# Patient Record
Sex: Female | Born: 1940
Health system: Southern US, Community
[De-identification: ages and names within clinical notes are randomized; demographics above are authoritative.]

## PROBLEM LIST (undated history)

## (undated) DIAGNOSIS — K5730 Diverticulosis of large intestine without perforation or abscess without bleeding: Secondary | ICD-10-CM

## (undated) DIAGNOSIS — G43909 Migraine, unspecified, not intractable, without status migrainosus: Secondary | ICD-10-CM

## (undated) DIAGNOSIS — K449 Diaphragmatic hernia without obstruction or gangrene: Secondary | ICD-10-CM

## (undated) DIAGNOSIS — I82409 Acute embolism and thrombosis of unspecified deep veins of unspecified lower extremity: Secondary | ICD-10-CM

## (undated) DIAGNOSIS — K222 Esophageal obstruction: Secondary | ICD-10-CM

## (undated) DIAGNOSIS — M069 Rheumatoid arthritis, unspecified: Secondary | ICD-10-CM

## (undated) DIAGNOSIS — E669 Obesity, unspecified: Secondary | ICD-10-CM

## (undated) DIAGNOSIS — M1990 Unspecified osteoarthritis, unspecified site: Secondary | ICD-10-CM

## (undated) DIAGNOSIS — Z800 Family history of malignant neoplasm of digestive organs: Secondary | ICD-10-CM

## (undated) DIAGNOSIS — E785 Hyperlipidemia, unspecified: Secondary | ICD-10-CM

## (undated) DIAGNOSIS — R002 Palpitations: Secondary | ICD-10-CM

## (undated) DIAGNOSIS — K2970 Gastritis, unspecified, without bleeding: Secondary | ICD-10-CM

## (undated) DIAGNOSIS — T7840XA Allergy, unspecified, initial encounter: Secondary | ICD-10-CM

## (undated) DIAGNOSIS — G8929 Other chronic pain: Secondary | ICD-10-CM

## (undated) DIAGNOSIS — K219 Gastro-esophageal reflux disease without esophagitis: Secondary | ICD-10-CM

## (undated) DIAGNOSIS — J189 Pneumonia, unspecified organism: Secondary | ICD-10-CM

## (undated) DIAGNOSIS — M549 Dorsalgia, unspecified: Secondary | ICD-10-CM

## (undated) DIAGNOSIS — Z86010 Personal history of colonic polyps: Secondary | ICD-10-CM

## (undated) DIAGNOSIS — G43119 Migraine with aura, intractable, without status migrainosus: Secondary | ICD-10-CM

## (undated) DIAGNOSIS — M797 Fibromyalgia: Secondary | ICD-10-CM

## (undated) DIAGNOSIS — I499 Cardiac arrhythmia, unspecified: Secondary | ICD-10-CM

## (undated) DIAGNOSIS — M419 Scoliosis, unspecified: Secondary | ICD-10-CM

## (undated) DIAGNOSIS — I10 Essential (primary) hypertension: Secondary | ICD-10-CM

## (undated) DIAGNOSIS — R079 Chest pain, unspecified: Secondary | ICD-10-CM

## (undated) DIAGNOSIS — I639 Cerebral infarction, unspecified: Secondary | ICD-10-CM

## (undated) DIAGNOSIS — K2990 Gastroduodenitis, unspecified, without bleeding: Secondary | ICD-10-CM

## (undated) HISTORY — DX: Gastritis, unspecified, without bleeding: K29.70

## (undated) HISTORY — DX: Diaphragmatic hernia without obstruction or gangrene: K44.9

## (undated) HISTORY — DX: Palpitations: R00.2

## (undated) HISTORY — DX: Esophageal obstruction: K22.2

## (undated) HISTORY — DX: Gastro-esophageal reflux disease without esophagitis: K21.9

## (undated) HISTORY — DX: Scoliosis, unspecified: M41.9

## (undated) HISTORY — DX: Rheumatoid arthritis, unspecified: M06.9

## (undated) HISTORY — DX: Diverticulosis of large intestine without perforation or abscess without bleeding: K57.30

## (undated) HISTORY — DX: Family history of malignant neoplasm of digestive organs: Z80.0

## (undated) HISTORY — PX: BREAST SURGERY: SHX581

## (undated) HISTORY — PX: BACK SURGERY: SHX140

## (undated) HISTORY — DX: Essential (primary) hypertension: I10

## (undated) HISTORY — DX: Cerebral infarction, unspecified: I63.9

## (undated) HISTORY — DX: Personal history of colonic polyps: Z86.010

## (undated) HISTORY — PX: UPPER GASTROINTESTINAL ENDOSCOPY: SHX188

## (undated) HISTORY — PX: TUMOR REMOVAL: SHX12

## (undated) HISTORY — DX: Obesity, unspecified: E66.9

## (undated) HISTORY — DX: Gastroduodenitis, unspecified, without bleeding: K29.90

## (undated) HISTORY — DX: Allergy, unspecified, initial encounter: T78.40XA

## (undated) HISTORY — DX: Migraine with aura, intractable, without status migrainosus: G43.119

## (undated) HISTORY — PX: ESOPHAGEAL DILATION: SHX303

## (undated) HISTORY — DX: Hyperlipidemia, unspecified: E78.5

## (undated) HISTORY — PX: COLON SURGERY: SHX602

## (undated) HISTORY — PX: SALIVARY GLAND SURGERY: SHX768

---

## 1998-01-02 ENCOUNTER — Encounter: Payer: Self-pay | Admitting: Obstetrics and Gynecology

## 1998-01-02 ENCOUNTER — Ambulatory Visit (HOSPITAL_COMMUNITY): Admission: RE | Admit: 1998-01-02 | Discharge: 1998-01-02 | Payer: Self-pay | Admitting: Obstetrics and Gynecology

## 1998-10-28 ENCOUNTER — Emergency Department (HOSPITAL_COMMUNITY): Admission: EM | Admit: 1998-10-28 | Discharge: 1998-10-28 | Payer: Self-pay | Admitting: Emergency Medicine

## 1998-10-28 ENCOUNTER — Encounter: Payer: Self-pay | Admitting: Emergency Medicine

## 1999-01-15 ENCOUNTER — Ambulatory Visit (HOSPITAL_COMMUNITY): Admission: RE | Admit: 1999-01-15 | Discharge: 1999-01-15 | Payer: Self-pay | Admitting: Internal Medicine

## 1999-01-15 ENCOUNTER — Encounter: Payer: Self-pay | Admitting: Obstetrics and Gynecology

## 1999-07-26 ENCOUNTER — Encounter: Payer: Self-pay | Admitting: Gastroenterology

## 1999-07-26 ENCOUNTER — Ambulatory Visit: Admission: RE | Admit: 1999-07-26 | Discharge: 1999-07-26 | Payer: Self-pay | Admitting: Gastroenterology

## 1999-09-02 ENCOUNTER — Other Ambulatory Visit: Admission: RE | Admit: 1999-09-02 | Discharge: 1999-09-02 | Payer: Self-pay | Admitting: Gastroenterology

## 1999-09-08 HISTORY — PX: SIGMOID RESECTION / RECTOPEXY: SUR1294

## 1999-09-26 ENCOUNTER — Encounter: Payer: Self-pay | Admitting: General Surgery

## 1999-10-01 ENCOUNTER — Encounter (INDEPENDENT_AMBULATORY_CARE_PROVIDER_SITE_OTHER): Payer: Self-pay | Admitting: Specialist

## 1999-10-01 ENCOUNTER — Inpatient Hospital Stay (HOSPITAL_COMMUNITY): Admission: RE | Admit: 1999-10-01 | Discharge: 1999-10-08 | Payer: Self-pay | Admitting: General Surgery

## 2000-02-11 ENCOUNTER — Ambulatory Visit (HOSPITAL_COMMUNITY): Admission: RE | Admit: 2000-02-11 | Discharge: 2000-02-11 | Payer: Self-pay | Admitting: *Deleted

## 2000-02-11 ENCOUNTER — Encounter: Payer: Self-pay | Admitting: *Deleted

## 2000-04-10 ENCOUNTER — Encounter: Payer: Self-pay | Admitting: General Surgery

## 2000-04-10 HISTORY — PX: EXPLORATORY LAPAROTOMY: SUR591

## 2000-04-16 ENCOUNTER — Encounter (INDEPENDENT_AMBULATORY_CARE_PROVIDER_SITE_OTHER): Payer: Self-pay | Admitting: Specialist

## 2000-04-16 ENCOUNTER — Inpatient Hospital Stay (HOSPITAL_COMMUNITY): Admission: RE | Admit: 2000-04-16 | Discharge: 2000-04-19 | Payer: Self-pay | Admitting: General Surgery

## 2000-07-17 ENCOUNTER — Emergency Department (HOSPITAL_COMMUNITY): Admission: EM | Admit: 2000-07-17 | Discharge: 2000-07-17 | Payer: Self-pay | Admitting: Emergency Medicine

## 2000-07-17 ENCOUNTER — Encounter: Payer: Self-pay | Admitting: Emergency Medicine

## 2000-12-28 ENCOUNTER — Other Ambulatory Visit: Admission: RE | Admit: 2000-12-28 | Discharge: 2000-12-28 | Payer: Self-pay | Admitting: *Deleted

## 2001-09-02 ENCOUNTER — Ambulatory Visit (HOSPITAL_COMMUNITY): Admission: RE | Admit: 2001-09-02 | Discharge: 2001-09-02 | Payer: Self-pay | Admitting: *Deleted

## 2001-09-02 ENCOUNTER — Encounter: Payer: Self-pay | Admitting: *Deleted

## 2002-02-18 DIAGNOSIS — K222 Esophageal obstruction: Secondary | ICD-10-CM | POA: Insufficient documentation

## 2002-02-21 ENCOUNTER — Other Ambulatory Visit: Admission: RE | Admit: 2002-02-21 | Discharge: 2002-02-21 | Payer: Self-pay | Admitting: *Deleted

## 2002-02-22 ENCOUNTER — Encounter: Payer: Self-pay | Admitting: Emergency Medicine

## 2002-02-22 ENCOUNTER — Emergency Department (HOSPITAL_COMMUNITY): Admission: EM | Admit: 2002-02-22 | Discharge: 2002-02-22 | Payer: Self-pay | Admitting: Emergency Medicine

## 2002-03-10 HISTORY — PX: TRIGGER FINGER RELEASE: SHX641

## 2002-12-13 ENCOUNTER — Encounter: Admission: RE | Admit: 2002-12-13 | Discharge: 2003-03-13 | Payer: Self-pay | Admitting: Family Medicine

## 2002-12-19 ENCOUNTER — Encounter: Admission: RE | Admit: 2002-12-19 | Discharge: 2002-12-19 | Payer: Self-pay | Admitting: Family Medicine

## 2002-12-19 ENCOUNTER — Encounter: Payer: Self-pay | Admitting: Family Medicine

## 2003-01-04 ENCOUNTER — Ambulatory Visit (HOSPITAL_COMMUNITY): Admission: RE | Admit: 2003-01-04 | Discharge: 2003-01-04 | Payer: Self-pay | Admitting: *Deleted

## 2003-01-11 ENCOUNTER — Other Ambulatory Visit: Admission: RE | Admit: 2003-01-11 | Discharge: 2003-01-11 | Payer: Self-pay | Admitting: *Deleted

## 2003-01-23 ENCOUNTER — Encounter: Admission: RE | Admit: 2003-01-23 | Discharge: 2003-01-23 | Payer: Self-pay | Admitting: Orthopedic Surgery

## 2003-02-10 ENCOUNTER — Encounter: Admission: RE | Admit: 2003-02-10 | Discharge: 2003-02-10 | Payer: Self-pay | Admitting: Orthopedic Surgery

## 2003-05-16 ENCOUNTER — Encounter: Admission: RE | Admit: 2003-05-16 | Discharge: 2003-05-16 | Payer: Self-pay | Admitting: Orthopedic Surgery

## 2003-05-17 ENCOUNTER — Encounter: Admission: RE | Admit: 2003-05-17 | Discharge: 2003-08-15 | Payer: Self-pay | Admitting: Family Medicine

## 2003-11-22 ENCOUNTER — Encounter: Admission: RE | Admit: 2003-11-22 | Discharge: 2003-11-22 | Payer: Self-pay | Admitting: Gastroenterology

## 2004-01-31 ENCOUNTER — Ambulatory Visit (HOSPITAL_COMMUNITY): Admission: RE | Admit: 2004-01-31 | Discharge: 2004-01-31 | Payer: Self-pay | Admitting: *Deleted

## 2004-02-06 ENCOUNTER — Other Ambulatory Visit: Admission: RE | Admit: 2004-02-06 | Discharge: 2004-02-06 | Payer: Self-pay | Admitting: *Deleted

## 2005-01-22 ENCOUNTER — Emergency Department (HOSPITAL_COMMUNITY): Admission: EM | Admit: 2005-01-22 | Discharge: 2005-01-22 | Payer: Self-pay | Admitting: Emergency Medicine

## 2005-01-28 ENCOUNTER — Ambulatory Visit: Payer: Self-pay | Admitting: Gastroenterology

## 2005-09-29 ENCOUNTER — Emergency Department (HOSPITAL_COMMUNITY): Admission: EM | Admit: 2005-09-29 | Discharge: 2005-09-30 | Payer: Self-pay | Admitting: Emergency Medicine

## 2005-12-08 ENCOUNTER — Ambulatory Visit (HOSPITAL_COMMUNITY): Admission: RE | Admit: 2005-12-08 | Discharge: 2005-12-08 | Payer: Self-pay | Admitting: *Deleted

## 2006-02-15 ENCOUNTER — Encounter: Admission: RE | Admit: 2006-02-15 | Discharge: 2006-02-15 | Payer: Self-pay | Admitting: Internal Medicine

## 2006-03-10 HISTORY — PX: SHOULDER SURGERY: SHX246

## 2006-03-23 ENCOUNTER — Encounter: Admission: RE | Admit: 2006-03-23 | Discharge: 2006-03-23 | Payer: Self-pay | Admitting: Family Medicine

## 2006-03-23 ENCOUNTER — Other Ambulatory Visit: Admission: RE | Admit: 2006-03-23 | Discharge: 2006-03-23 | Payer: Self-pay | Admitting: *Deleted

## 2006-04-20 ENCOUNTER — Ambulatory Visit (HOSPITAL_COMMUNITY): Admission: RE | Admit: 2006-04-20 | Discharge: 2006-04-21 | Payer: Self-pay | Admitting: Specialist

## 2007-03-11 HISTORY — PX: COLONOSCOPY: SHX174

## 2007-05-06 ENCOUNTER — Ambulatory Visit: Payer: Self-pay | Admitting: Gastroenterology

## 2007-05-06 LAB — CONVERTED CEMR LAB
AST: 20 units/L (ref 0–37)
Albumin: 3.8 g/dL (ref 3.5–5.2)
Alkaline Phosphatase: 60 units/L (ref 39–117)
BUN: 8 mg/dL (ref 6–23)
Basophils Absolute: 0.1 10*3/uL (ref 0.0–0.1)
Basophils Relative: 1.1 % — ABNORMAL HIGH (ref 0.0–1.0)
CO2: 32 meq/L (ref 19–32)
Chloride: 106 meq/L (ref 96–112)
Creatinine, Ser: 0.9 mg/dL (ref 0.4–1.2)
Eosinophils Absolute: 0.1 10*3/uL (ref 0.0–0.6)
Eosinophils Relative: 1.5 % (ref 0.0–5.0)
Folate: 13.9 ng/mL
GFR calc Af Amer: 81 mL/min
GFR calc non Af Amer: 67 mL/min
HCT: 45.1 % (ref 36.0–46.0)
Hemoglobin: 14.9 g/dL (ref 12.0–15.0)
Iron: 68 ug/dL (ref 42–145)
Lymphocytes Relative: 35.5 % (ref 12.0–46.0)
MCHC: 33 g/dL (ref 30.0–36.0)
MCV: 82.9 fL (ref 78.0–100.0)
Monocytes Absolute: 0.5 10*3/uL (ref 0.2–0.7)
Monocytes Relative: 7.9 % (ref 3.0–11.0)
Neutro Abs: 3.6 10*3/uL (ref 1.4–7.7)
Neutrophils Relative %: 54 % (ref 43.0–77.0)
Potassium: 4.4 meq/L (ref 3.5–5.1)
RBC: 5.44 M/uL — ABNORMAL HIGH (ref 3.87–5.11)
RDW: 12.9 % (ref 11.5–14.6)
Saturation Ratios: 18.1 % — ABNORMAL LOW (ref 20.0–50.0)
Sodium: 143 meq/L (ref 135–145)
Total Bilirubin: 1.2 mg/dL (ref 0.3–1.2)
Total Protein: 6.8 g/dL (ref 6.0–8.3)
Transferrin: 269 mg/dL (ref >=212.0)
Vitamin B-12: 247 pg/mL (ref 211–911)
WBC: 6.7 10*3/uL (ref 4.5–10.5)

## 2007-05-10 ENCOUNTER — Ambulatory Visit (HOSPITAL_COMMUNITY): Admission: RE | Admit: 2007-05-10 | Discharge: 2007-05-10 | Payer: Self-pay | Admitting: *Deleted

## 2007-05-12 ENCOUNTER — Ambulatory Visit (HOSPITAL_COMMUNITY): Admission: RE | Admit: 2007-05-12 | Discharge: 2007-05-12 | Payer: Self-pay | Admitting: Gastroenterology

## 2007-05-18 ENCOUNTER — Ambulatory Visit: Payer: Self-pay | Admitting: Gastroenterology

## 2007-05-27 ENCOUNTER — Other Ambulatory Visit: Admission: RE | Admit: 2007-05-27 | Discharge: 2007-05-27 | Payer: Self-pay | Admitting: *Deleted

## 2007-05-31 ENCOUNTER — Encounter: Payer: Self-pay | Admitting: Gastroenterology

## 2007-05-31 ENCOUNTER — Ambulatory Visit: Payer: Self-pay | Admitting: Gastroenterology

## 2007-05-31 DIAGNOSIS — K5730 Diverticulosis of large intestine without perforation or abscess without bleeding: Secondary | ICD-10-CM | POA: Insufficient documentation

## 2007-05-31 DIAGNOSIS — K2970 Gastritis, unspecified, without bleeding: Secondary | ICD-10-CM | POA: Insufficient documentation

## 2007-05-31 DIAGNOSIS — Z860100 Personal history of colon polyps, unspecified: Secondary | ICD-10-CM

## 2007-05-31 DIAGNOSIS — K449 Diaphragmatic hernia without obstruction or gangrene: Secondary | ICD-10-CM | POA: Insufficient documentation

## 2007-05-31 DIAGNOSIS — K2990 Gastroduodenitis, unspecified, without bleeding: Secondary | ICD-10-CM

## 2007-05-31 HISTORY — DX: Personal history of colon polyps, unspecified: Z86.0100

## 2007-05-31 HISTORY — DX: Personal history of colonic polyps: Z86.010

## 2007-06-02 DIAGNOSIS — Z86010 Personal history of colonic polyps: Secondary | ICD-10-CM | POA: Insufficient documentation

## 2007-06-05 ENCOUNTER — Inpatient Hospital Stay (HOSPITAL_COMMUNITY): Admission: EM | Admit: 2007-06-05 | Discharge: 2007-06-07 | Payer: Self-pay | Admitting: Emergency Medicine

## 2007-07-01 ENCOUNTER — Ambulatory Visit: Payer: Self-pay | Admitting: Gastroenterology

## 2007-07-01 DIAGNOSIS — E669 Obesity, unspecified: Secondary | ICD-10-CM | POA: Insufficient documentation

## 2007-07-01 DIAGNOSIS — R519 Headache, unspecified: Secondary | ICD-10-CM | POA: Insufficient documentation

## 2007-07-01 DIAGNOSIS — K219 Gastro-esophageal reflux disease without esophagitis: Secondary | ICD-10-CM | POA: Insufficient documentation

## 2007-07-01 DIAGNOSIS — R51 Headache: Secondary | ICD-10-CM

## 2008-04-07 ENCOUNTER — Encounter: Payer: Self-pay | Admitting: Gastroenterology

## 2008-12-28 ENCOUNTER — Ambulatory Visit (HOSPITAL_COMMUNITY): Admission: RE | Admit: 2008-12-28 | Discharge: 2008-12-28 | Payer: Self-pay | Admitting: Gynecology

## 2009-06-21 ENCOUNTER — Emergency Department (HOSPITAL_COMMUNITY): Admission: EM | Admit: 2009-06-21 | Discharge: 2009-06-21 | Payer: Self-pay | Admitting: Emergency Medicine

## 2009-10-08 ENCOUNTER — Telehealth: Payer: Self-pay | Admitting: Gastroenterology

## 2010-02-18 ENCOUNTER — Ambulatory Visit (HOSPITAL_COMMUNITY)
Admission: RE | Admit: 2010-02-18 | Discharge: 2010-02-18 | Payer: Self-pay | Source: Home / Self Care | Attending: Gynecology | Admitting: Gynecology

## 2010-03-10 HISTORY — PX: BREAST CYST INCISION AND DRAINAGE: SHX14

## 2010-03-31 ENCOUNTER — Encounter: Payer: Self-pay | Admitting: Surgery

## 2010-03-31 ENCOUNTER — Encounter: Payer: Self-pay | Admitting: Family Medicine

## 2010-04-11 NOTE — Progress Notes (Signed)
Summary: refill  Phone Note Call from Patient Call back at Home Phone [phone]   Caller: Patient Call For: Dr Jarold Motto Reason for Call: Refill Medication, Talk to Nurse Summary of Call: Patient needs rx refill faxed over to [facility] for a 90 days supply one tablet twice a day. (Nexium) Initial call taken by: Tawni Levy,  October 08, 2009 1:25 PM  Follow-up for Phone Call        Rx sent as request.  Pt notified.  Pt also needs follow up appt.  Pt will call back to sch. Follow-up by: Ashok Cordia RN,  October 08, 2009 2:03 PM    Prescriptions: NEXIUM 40 MG  CPDR (ESOMEPRAZOLE MAGNESIUM) 1 capsule twice a day 30 minutes before meals  #180 x 1   Entered by:   Ashok Cordia RN   Authorized by:   Mardella Layman MD [facility]   Signed by:   Ashok Cordia RN on 10/08/2009   Method used:   Faxed to ...       [facility] (mail-order)             , [place]         Ph: [phone]       Fax: [phone]   RxID:   [id number]

## 2010-05-29 LAB — BASIC METABOLIC PANEL
BUN: 12 mg/dL (ref 6–23)
CO2: 27 mEq/L (ref 19–32)
Calcium: 8.8 mg/dL (ref 8.4–10.5)
Chloride: 103 mEq/L (ref 96–112)
Creatinine, Ser: 0.82 mg/dL (ref 0.4–1.2)
GFR calc Af Amer: >60 mL/min (ref >60)
GFR calc non Af Amer: >60 mL/min (ref >60)
Glucose, Bld: 114 mg/dL — ABNORMAL HIGH (ref 70–99)
Potassium: 4 mEq/L (ref 3.5–5.1)
Sodium: 138 mEq/L (ref 135–145)

## 2010-05-29 LAB — URINALYSIS, ROUTINE W REFLEX MICROSCOPIC
Bilirubin Urine: NEGATIVE
Glucose, UA: NEGATIVE mg/dL
Hgb urine dipstick: NEGATIVE
Protein, ur: NEGATIVE mg/dL
Specific Gravity, Urine: 1.017 (ref 1.005–1.030)
Urobilinogen, UA: 0.2 mg/dL (ref 0.0–1.0)
pH: 6 (ref 5.0–8.0)

## 2010-05-29 LAB — HEPATIC FUNCTION PANEL
ALT: 20 U/L (ref 0–35)
AST: 24 U/L (ref 0–37)
Albumin: 3.8 g/dL (ref 3.5–5.2)
Alkaline Phosphatase: 53 U/L (ref 39–117)
Bilirubin, Direct: 0.2 mg/dL (ref 0.0–0.3)
Indirect Bilirubin: 0.6 mg/dL (ref 0.3–0.9)
Total Bilirubin: 0.8 mg/dL (ref 0.3–1.2)
Total Protein: 6.9 g/dL (ref 6.0–8.3)

## 2010-05-29 LAB — DIFFERENTIAL
Basophils Absolute: 0 10*3/uL (ref 0.0–0.1)
Basophils Relative: 0 % (ref 0–1)
Eosinophils Absolute: 0 10*3/uL (ref 0.0–0.7)
Eosinophils Relative: 0 % (ref 0–5)
Lymphocytes Relative: 5 % — ABNORMAL LOW (ref 12–46)
Lymphs Abs: 0.3 10*3/uL — ABNORMAL LOW (ref 0.7–4.0)
Monocytes Absolute: 0.5 10*3/uL (ref 0.1–1.0)
Monocytes Relative: 8 % (ref 3–12)
Neutro Abs: 5.9 10*3/uL (ref 1.7–7.7)
Neutrophils Relative %: 87 % — ABNORMAL HIGH (ref 43–77)

## 2010-05-29 LAB — CBC
HCT: 42.8 % (ref 36.0–46.0)
Hemoglobin: 14.4 g/dL (ref 12.0–15.0)
MCHC: 33.6 g/dL (ref 30.0–36.0)
MCV: 84.1 fL (ref 78.0–100.0)
Platelets: 249 10*3/uL (ref 150–400)
RBC: 5.1 MIL/uL (ref 3.87–5.11)
RDW: 13.9 % (ref 11.5–15.5)
WBC: 6.8 10*3/uL (ref 4.0–10.5)

## 2010-05-29 LAB — LIPASE, BLOOD: Lipase: 25 U/L (ref 11–59)

## 2010-07-23 NOTE — Consult Note (Signed)
[name], [name]              ACCOUNT NO.:  [id number]   MEDICAL RECORD NO.:  [id number]          PATIENT TYPE:  INP   LOCATION:  3733                         FACILITY:  [facility]   PHYSICIAN:  Elmore Guise., M.D.DATE OF BIRTH:  [birth year]   DATE OF CONSULTATION:  06/06/2007  DATE OF DISCHARGE:                                 CONSULTATION   REASON FOR CONSULTATION:  Chest pain.   HISTORY OF PRESENT ILLNESS:  [name] is a very pleasant 70 year old  white female with past medical history of borderline hypertension,  obesity, migraine headaches who is admitted with malaise and increased  blood pressure lability.  The patient reports a normal state of health.  She actually underwent esophageal dilatation back earlier in the week on  March 23.  Since that time she has been having off and on aching in the  substernal region.  Yesterday she felt lightheaded with blurry vision,  this happened after lunch.  She had an aching in the left side of her  chest (she said this is different than her esophageal pain).  She denied  any significant exertional worsening.  She went to her local fire  department and there found to have a blood pressure of 200/110.  She was  given aspirin with some improvement.  Blood pressure continued to be  elevated at 180/90 and she was advised to go to the emergency room here.  On arrival here her blood pressure continued to be elevated at 200/90.  She was given pain medication for control as well as metoprolol with  improvement in her symptoms.  Her initial cardiac markers were negative.  However, she had an elevated D-dimer and was sent for CT scan of the  chest showing no PE and no dissection.  Now she is resting comfortably  in bed, tolerating p.o. diet with no further symptoms.  She does report  that she is somewhat limited with her exertion because of shortness of  breath.   HOME MEDICATIONS:  Include Nexium, Tylox and Midrin.   HOSPITAL  MEDICATIONS:  1. Her medications here while in the hospital are Protonix.  2. Lovenox 40 mg subcu daily.  3. Metoprolol 50 mg twice daily.  4. Aspirin 81 mg daily.  5. Nitroglycerin drip (to be stopped).  6. Morphine p.r.n.  7. She also has clonidine 0.1 mg every 4 hours p.r.n.   ALLERGIES:  To CODEINE.   FAMILY HISTORY:  Positive for heart disease with her father.  The  patient is unsure of when this may have started for him.   SOCIAL HISTORY:  She is married.  Denies any tobacco or alcohol use.   PHYSICAL EXAMINATION:  VITAL SIGNS:  She is afebrile.  Blood pressure is  126/60, heart rate 62 showing sinus rhythm on telemetry monitoring.  O2  sats are 97% on room air.  GENERAL:  She is a very pleasant middle-aged white female alert and  oriented x4.  No acute distress.  HEENT:  Appear normal.  NECK:  Supple.  No lymphadenopathy, 2+ carotids, no JVD and no bruits.  LUNGS:  Lungs  were clear.  HEART:  Heart is regular with normal S1-S2.  No significant murmurs,  gallops or rubs.  ABDOMEN:  Abdomen is obese, soft, nontender, nondistended with no  rebound or guarding.  EXTREMITIES:  Warm with no significant edema.   Her blood work was reviewed and showed a white blood cell count of 7.8,  hemoglobin of 14.6, platelet count of 279.  Her D-dimer was elevated at  1.56.  She had a BUN and creatinine of 10 and 1.0.  Her total bili was  1.1 without alkaline phosphatase of 48.  Her cardiac markers were  negative x2.  Chest x-ray shows no acute cardiopulmonary disease.  ECG  showed normal sinus rhythm with poor R-wave progression.   IMPRESSION:  1. Atypical chest pain.  2. Increased blood pressure lability.  3. Borderline dyslipidemia (cholesterol here LDL 136 with total 200).  4. Gastroesophageal reflux disease.  5. Status post esophageal stricture with esophageal dilatation May 31, 2007.   PLAN:  Her pain is atypical but she does have multiple co-morbidities  including  obesity, family history of heart disease, hypertension and  borderline dyslipidemia.  With her poor functional capacity I would  recommend adenosine Cardiolite for cardiac evaluation.  I would  recommend her to be n.p.o. after midnight.  If her procedure cannot be  performed here at the hospital tomorrow and her pain is still controlled  I do feel it would be safe at that time to allow her to be discharged  and have outpatient stress testing done.  All this was discussed with  her and her husband at length.      Elmore Guise., M.D.  Electronically Signed     TWK/MEDQ  D:  06/06/2007  T:  06/06/2007  Job:  [id number]

## 2010-07-23 NOTE — Assessment & Plan Note (Signed)
[facility]                         GASTROENTEROLOGY OFFICE NOTE   NAME:[name], [name]                     MRN:          [id number]  DATE:05/06/2007                            DOB:          07-09-1940    The patient is a 70 year old white female who has had chronic acid  reflux and who is now having rather severe breakthrough symptoms at  night and also has early morning nausea.  She is having to wake up from  sleep to take antacids.  She has also noted some recurrent solid food  dysphagia.  Her last endoscopic examination with esophageal dilatation  was in December of 2003.  She is having no lower GI problems, but does  have a history of recurrent colon polyps and last had colonoscopy some 8-  9 years ago.  She had rather severe diverticulosis in the past with  segmental plaque colitis requiring sigmoid resection by Sheppard Plumber.  Earlene Plater, M.D. in July of 2001.  She denies anorexia, weight loss, or other  lower GI or mobility problems.  She does have occasional right upper  quadrant discomfort, gas, and bloating.  She has a very strong family  history of gallbladder illnesses in multiple family members.  She had a  CT scan of the abdomen in September of 2005 which was unremarkable.  An  ultrasound in January of 2002.  She also has a strong family history of  pancreatic carcinoma and previous CA19-9 in August of 2005 was normal.   In addition to Nexium 40 mg a day, she is taking p.r.n. Tylox, Midrin,  and Phenergan.  She is under the care of Chales Salmon. Abigail Miyamoto, M.D.   PHYSICAL EXAMINATION:  GENERAL:  She is a healthy appearing, middle-aged  white female in no distress appearing her stated age.  VITAL SIGNS:  She weighs 208 pounds, blood pressure 140/84, pulse 76 and  regular.  I could not appreciate stigmata of chronic liver disease with  thyromegaly.  CHEST:  Clear and she was in a regular rhythm without murmurs, rubs, or  gallops.  ABDOMEN:  There  was some mild hepatomegaly in the right upper quadrant  without any definite splenomegaly, other abdominal masses, or  tenderness.  Bowel sounds were normal.  Mental status was clear.  EXTREMITIES:  Peripheral extremities were normal.   ASSESSMENT:  1. Worsening acid reflux with probable recurrent peptic stricture of      the esophagus.  2. History of previous sigmoid resection, colon polyps, need for      follow-up screening colonoscopy.  3. Probable fatty infiltration of the liver, rule out cholelithiasis.  4. Strong family history of pancreatic cancer.  5. Exogenous obesity.  6. History of chronic headaches.   RECOMMENDATIONS:  1. Check screening laboratory parameters.  2. Outpatient ultrasound examination.  3. Outpatient endoscopy and dilatation if needed.  4. Follow-up colonoscopy examination.  5. Reflux regime reviewed with the patient and we will increase her      Nexium to 40 mg twice a day 30 minutes before meals.     Vania Rea. Jarold Motto, MD, Caleen Essex, FAGA  Electronically Signed    DRP/MedQ  DD: 05/06/2007  DT: 05/06/2007  Job #: [id number]

## 2010-07-23 NOTE — H&P (Signed)
NAMEEMMORY, SOLIVAN              ACCOUNT NO.:  [id number]   MEDICAL RECORD NO.:  [id number]          PATIENT TYPE:  INP   LOCATION:  3733                         FACILITY:  [facility]   PHYSICIAN:  Corinna L. Lendell Caprice, MDDATE OF BIRTH:  1940-06-29   DATE OF ADMISSION:  06/05/2007  DATE OF DISCHARGE:                              HISTORY & PHYSICAL   CHIEF COMPLAINT:  Chest pain.   HISTORY OF PRESENT ILLNESS:  Ms. Chervenak is a 70 year old white female  who presents with left-sided chest discomfort; it occurred at rest.  She  has a history of gastroesophageal reflux disease and recent stricture  dilatation.  Apparently, she told the ED physician that the pain was  different than reflux, but now she says she is not sure.  She also felt  dizzy with it and felt like her blood pressure was going too high.  She had someone take her blood pressure and it was noted to be about 200  systolic.  She has no history of hypertension.  She does not know her  cholesterol status.  She has no risk factors for heart disease other  than age.  She has never had a cardiac workup with respect to a  Cardiolite or other.  Currently, the pressure feels like an ache.  She  took 4 baby aspirins today; she also received morphine and was started  on the nitroglycerin drip.  She thinks the morphine probably helped more  than the nitroglycerin.  She has no palpitations or dyspnea.   PAST MEDICAL HISTORY:  1. Osteoarthritis and degenerative disk disease of the back.  2. Gastroesophageal reflux disease with stricture and recent      dilatation.  3. Migraines.   MEDICATIONS:  Include Nexium, Tylox as needed, Midrin as needed,  Phenergan as needed.   PHYSICAL EXAMINATION:  VITAL SIGNS:  Temperature is 99.4, blood pressure  199/87, pulse 90, respiratory rate 20, oxygen saturation 96% on room  air.  GENERAL:  The patient is an obese white female in no acute distress.  HEENT:  Normocephalic, atraumatic.  Pupils are  equal, round, and  reactive to light.  Moist mucous membranes.  NECK:  Supple.  No lymphadenopathy.  No JVD or carotid bruits.  LUNGS:  Clear to auscultation bilaterally without wheezes, rhonchi or  rales.  CARDIOVASCULAR:  Regular rate and rhythm without murmurs, gallops or  rubs.  CHEST:  No chest wall tenderness.  ABDOMEN:  Obese, soft, nontender.  GU AND RECTAL:  Deferred.  EXTREMITIES:  No clubbing, cyanosis or edema.  No calf tenderness.  SKIN:  No rash.  PSYCHIATRIC;  Normal affect.  NEUROLOGIC:  Alert and oriented.  Cranial nerves and sensorimotor exam  intact.   LABORATORY AND ACCESSORY CLINICAL DATA:  Chest x-ray showed nothing  acute.   CT of brain was normal.   CT angiogram of the chest showed no pulmonary embolus, scattered  atelectasis. nothing acute.   EKG shows normal sinus rhythm with possible age-indeterminate infarct.   CBC unremarkable.  D-dimer 1.56.  Complete metabolic panel unremarkable.  Two sets of point-of-care enzymes negative.   ASSESSMENT  AND PLAN:  1. Chest pain:  The patient will be admitted to telemetry to rule out      myocardial infarction.  We will check fasting lipids and consider a      cardiology consult for Cardiolite  2. Malignant hypertension:  I will continue the nitroglycerin drip for      now and also start a beta blocker and p.r.n. clonidine.  She has no      history of hypertension.  3. Gastroesophageal reflux with stricture, recent dilatation.  This      may be esophageal spasm.  I will continue proton pump inhibitor.  4. History of migraine.  5. Osteoarthritis and degenerative disk disease.      Corinna L. Lendell Caprice, MD  Electronically Signed     CLS/MEDQ  D:  06/06/2007  T:  06/06/2007  Job:  [id number]   cc:   Chales Salmon. Abigail Miyamoto, M.D.  Vania Rea. Jarold Motto, MD, Caleen Essex, FAGA

## 2010-07-23 NOTE — Discharge Summary (Signed)
NAMEAMONDA, [name]              ACCOUNT NO.:  [id number]   MEDICAL RECORD NO.:  [id number]          PATIENT TYPE:  INP   LOCATION:  3733                         FACILITY:  [facility]   PHYSICIAN:  Corinna L. Lendell Caprice, MDDATE OF BIRTH:  [birth year]   DATE OF ADMISSION:  06/05/2007  DATE OF DISCHARGE:  06/07/2007                               DISCHARGE SUMMARY   DISCHARGE DIAGNOSES:  1. Chest pain, most likely gastrointestinal in origin.  2. Malignant hypertension.  3. Gastroesophageal reflux disease with esophageal stricture and      recent dilatation.  4. Osteoarthritis and degenerative disk disease.  5. Obesity.  6. Hyperlipidemia.   DISCHARGE MEDICATIONS:  1. Continue Nexium.  2. Cardizem CD 120 mg p.o. daily has been started.  3. She may resume her Midrin, Tylox, and Phenergan as needed.  Follow      up with Dr. Abigail Miyamoto within a week to check blood pressure.  4. Zocor 20 mg a day.   CONSULTATIONS:  Dr. Lady Deutscher.   PROCEDURES:  None.   PERTINENT LABORATORY DATA:  Serial cardiac enzymes were negative.  CBC  unremarkable.  D-dimer 1.56.  Complete metabolic panel unremarkable.  LDL 136, HDL 44, total cholesterol 213, TSH 2.986.   SPECIAL STUDIES/RADIOLOGY:  EKG showed normal sinus rhythm with possible  age indeterminate anterior infarct.  Chest x-ray showed nothing acute.  CT brain was normal.  CT angiogram of the chest showed no pulmonary  embolus, scattered atelectasis.  Adenosine Cardiolite was negative for  ischemia, ejection fraction 83%.   HISTORY AND HOSPITAL COURSE:  Please see H and P for complete admission  details.  [name] is a 70 year old white female who is obese and  has gastroesophageal reflux disease who presented with left-sided chest  pressure.  She was noted to have a very elevated blood pressure in the  field.  Her blood pressure was 200 systolic.  She took aspirin and  received nitroglycerin without much relief.  The morphine in the  emergency  room helped.  Her temperature was 99.4, blood pressure 199/87,  otherwise normal vital signs.  She was obese and had otherwise normal  exam.  The patient was admitted and ruled out for MI.  She remained in  normal sinus rhythm.  Her blood pressure was controlled with metoprolol.  She was noted to be hyperlipidemic and started on a statin.  Dr. Reyes Ivan  was on-call for [facility] and supervised a Cardiolite which was  negative.  The patient, I suspect, had chest pain from GI etiology and  is already on Nexium and is followed by Dr. Jarold Motto for this.  Her  blood pressure was well controlled after metoprolol was started.      Corinna L. Lendell Caprice, MD  Electronically Signed     CLS/MEDQ  D:  06/07/2007  T:  06/08/2007  Job:  [id number]   cc:   Chales Salmon. Abigail Miyamoto, M.D.  Vania Rea. Jarold Motto, MD, Caleen Essex, FAGA  Elmore Guise., M.D.

## 2010-07-26 NOTE — Op Note (Signed)
[place]. [facility]  Patient:    [name], [name]                       MRN: [id number] Proc. Date: 10/01/99 Attending:  Bedelia Person, M.D.                           Operative Report  PROCEDURE:  Epidural catheter placement for postoperative epidural fentanyl pain control.  ANESTHESIOLOGIST:  Bedelia Person, M.D.  Prior to the surgical procedure the risks and benefits of postoperative epidural fentanyl pain control were discussed with the patient by Dr. Edwin Cap. Zoila Shutter. All of her questions were answered.  The patient elected to proceed with this technique in consultation with her primary surgeon, Dr. Sheppard Plumber. Davis.  DESCRIPTION OF PROCEDURE:  After the surgical dressing was applied, the patient was turned to the full right lateral decubitus position.  A Betadine prep x 3 was performed on the lumbar region.  A sterile technique was used.  A #17 gauge Tuohy needle was inserted in a midline approach at the L3-4 interspace using air loss of resistance.  The epidural space was located on the first pass.  There was no blood or CSF noted.  A non-styletted catheter was then placed 6.0 cm into the epidural space, and the needle was removed.  There was a negative aspiration for blood or CSF through the epidural catheter.  A mixture of preservative-free Xylocaine 1%, 5 cc of 0.9 normal saline, 10 cc of 100 mg of fentanyl was then slowly injected into the epidural catheter.  The catheter was securely adhesed the patients back using Hypafix dressing over a sterile 2 x 2 sterile gauze at the insertion site.  The patient was then extubated and taken to the recovery room in good condition. There she was placed on a continuous infusion of 5 mcg per cc of epidural fentanyl at an initial rate of 16 cc per hour.  She will be evaluated over the next two o three days for adequacy of pain control.  There were no complications during the procedure. DD:   10/01/99 TD:  10/02/99 Job: [id number]

## 2010-07-26 NOTE — Op Note (Signed)
[facility]  Patient:    [name], [name]                       MRN: [id number] Proc. Date: 04/16/00 Attending:  Sheppard Plumber. Earlene Plater, M.D.                           Operative Report  PREOPERATIVE DIAGNOSIS:  Ventral incisional hernia.  POSTOPERATIVE DIAGNOSIS:  Ventral incisional hernia.  OPERATION:  Repair of ventral incisional hernia with mesh.  SURGEON:  Timothy E. Earlene Plater, M.D.  ANESTHESIA:  General.  DESCRIPTION OF PROCEDURE:  [name] was prepared at home and brought to the hospital this morning, evaluated, and taken to Room #1, placed supine. General endotracheal anesthesia was administered.  Foley catheter, PAS hose, and orogastric tube placed.  The abdomen was prepped and draped in the usual fashion.  The ventral incisional hernia was in the upper midline.  The remainder of the incision was intact.  The skin was incised over the hernia defect, the abundance of cutaneous tissue dissected.  The hernia sac dissected from the surrounding fat down to the fascial edges.  The fascial edges were dissected back approximately 6 cm around and about the hernia.  The sac was opened.  There peritoneum of the hernia sac was dissected from the abdominal contents, mostly omentum, and this was reduced back into the abdomen, and the wound was free.  The excess peritoneum, i.e., the hernia sac was excised and submitted to pathology.  The fascial edges were freshened up. This having been accomplished, the midline was reclosed with #1 Prolene suture in a continuous fashion.  A piece of mesh was cut and fashioned to fit over the defect and around to the normal fascia and sewn down with running #1 PDS suture.  A 10 mm drape placed of the mesh and brought out through an inferior stab wound and tied to the skin.  The wound was dry, the counts were correct, and the skin was closed with wide skin staples.  The Harrison Mons was attached to suction.  Second counts correct.  She  tolerated it well and was awakened and taken to the recovery room in good condition. DD:  04/16/00 TD:  04/17/00 Job: [id number]

## 2010-07-26 NOTE — Discharge Summary (Signed)
[facility]  Patient:    [name], [name]                     MRN: [id number] Adm. Date:  [id number] Disc. Date: 04/19/00 Attending:  Carson Myrtle                           Discharge Summary  ADMISSION DIAGNOSIS:  Repair of ventral hernia, done on April 16, 2000.  HISTORY OF PRESENT ILLNESS:  This is a 70 year old lady who underwent a colon resection for diverticulitis in June 2001, and after coughing with a fit of pneumonia in the postoperative period developed an incisional hernia.  She is brought back at this time for repair.  HOSPITAL COURSE:  She underwent a repair of ventral incisional hernia by Dr. Earlene Plater on April 16, 2000, and did well.  She had an abdominal binder placed and Jackson-Pratt drain in place.  This gradually diminished, but still had a fair amount coming out every day, and thus was left in place.  She was ready for discharge on April 19, 2000.  She had been instructed in management of the Jackson-Pratt drain.  She was eating very well.  She was instructed to return to the office in 3 to 5 days.  CONDITION ON DISCHARGE:  Good.  DISCHARGE MEDICATIONS:  Tylox 20 to take p.r.n. pain.  FINAL DIAGNOSIS:  Ventral incisional hernia, status post repair with mesh. DD:  04/19/00 TD:  04/20/00 Job: [id number]

## 2010-07-26 NOTE — Discharge Summary (Signed)
[place]. [facility]  Patient:    [name]                      MRN: [id number] Adm. Date:  [id number] Disc. Date: [id number] Attending:  Carson Myrtle CC:         Vania Rea. Jarold Motto, M.D. [facility]   Discharge Summary  FINAL DIAGNOSES: 1. Chronic diverticulosis. 2. History of diverticulitis.  OPERATION PERFORMED:  October 01, 1999, sigmoid colectomy.  HISTORY OF PRESENT ILLNESS:  This patient was seen and evaluated and followed in the office with known recurrent diverticulitis.  She had elected after careful consultation consideration to proceed with a sigmoid colectomy.  She was prepared at home, brought to the hospital on the day of surgery and taken to the operating room.  HOSPITAL COURSE:  The patient underwent sigmoid colectomy.  She had a smooth, benign uncomplicated postoperative course progressing through removal of tubes IV, NG tubes, etc., progression of bowel function, resumption of diet.  Bowel movements were noted, gas passing.  Staples were removed.  She was afebrile. She was improved, ambulatory, tolerating a soft diet and will be seen and followed as an outpatient on October 08, 1999.  Her pathology report showed diverticulosis, diverticulitis, no malignancy.  None of the patients laboratory data are contained on the chart, but are thought to be within normal limits.  Again, the patient will be seen and followed as an outpatient. DD:  10/18/99 TD:  10/20/99 Job: [id number]

## 2010-07-26 NOTE — Op Note (Signed)
[facility]  Patient:    [name], [name]                       MRN: [id number] Proc. Date: 10/01/99 Attending:  Sheppard Plumber. Earlene Plater, M.D.                           Operative Report  PREOPERATIVE DIAGNOSIS:  Chronic recurrent diverticulitis, chronic diverticulosis.  OPERATION:  Sigmoid colectomy.  SURGEON:  Timothy E. Earlene Plater, M.D.  ASSISTANT:  P.J. Carolynne Edouard.  ANESTHESIA:  C.R.N.A., supervised M.D. with postoperative epidural.  INDICATIONS:  [name] is 32.  She has had years of diverticulosis with intermittent bouts of diverticulitis, increasing in frequency in the past two years, requiring a hospitalization on one account, and frequent outpatient visits and antibiotic courses.  Because of that, and the risks of complications, she wishes to proceed with a sigmoid colectomy, which has been carefully and thoroughly discussed with her.  DESCRIPTION OF PROCEDURE:  The patient was taken to the operating room and placed supine.  General endotracheal anesthesia was administered.  She was then placed in the elephant stirrups.  A Foley catheter was inserted.  Stirrups were placed in the down position.  The abdomen was scrubbed, prepped, and draped in the usual fashion. A nasogastric tube also was inserted.  A generous midline incision was made around the umbilicus to the right, down to the suprapubic area.  There was four to five inches of fatty tissue in the subcutaneous space.  This was divided sharply. Bleeding points were cauterized.  The fascia was identified and opened in the midline.  Notably there was an umbilical hernia with preperitoneal fat incarcerated.  Retractors were applied.  The abdomen was explored.  The hiatal hernia was not wo finger breadths in width.  The nasogastric tube was in good position.  The liver was free of disease.  The colon had been thoroughly evaluated and was known to e normal.  The sigmoid colon was  diseased, thickened, inflamed, and redundant, but in the usual location.  The rectum was palpably normal, and the descending colon was palpably normal.  We chose a site at the junction of the descending-sigmoid colon, divided the colon with a GIA stapling device at that point, and carefully divided the mesentery near the colon, down to the rectum.  The sigmoid rectum was divided across at this point.  The specimens were passed off the field.  The mesentery as intact and dry.  The descending colon after release of the white line laterally, easily reached the upper rectum at the sacral promontory, and there was no tension. The hand-sewn end-to-end one-layer anastomosis was accomplished with #2-0 and #3-0 silks.  This was accomplished around and about the entire circumference.  This appeared to be airtight and watertight, laying nicely over the pelvic brim. The mesentery was closed on either side with #2-0 silk sutures.  Irrigation was clear. There was no other pathology.  Notably both ovaries were small. The uterus was small but contained a number of fibroids.  Irrigation was carried out and was clear.  Counts were correct.  The small bowel was left to lay in the midabdomen. The omentum was pulled over it, and the abdomen was closed in a single layer with #1 PDS sutures, except around the umbilicus where the umbilical hernia and incarcerated preperitoneal fat were removed.  The fascia was closed there with interrupted Prolenes which were  incorporated in a continuous suture of #1 PDS. The subcutaneous was copiously irrigated and the skin closed with wide skin staples. The second count is correct.  She tolerated it well, and after an epidural was applied, she was removed to the recovery room in good condition. DD:  10/01/99 TD:  10/02/99 Job: [id number]

## 2010-07-26 NOTE — H&P (Signed)
[facility]  Patient:    [name], [name]                     MRN: [id number] Adm. Date:  [id number] Attending:  Carson Myrtle                         History and Physical  ADMISSION DIAGNOSES: 1. Postoperative care. 2. Repair of ventral incisional hernia.  HISTORY OF PRESENT ILLNESS:  [name] is now 70.  She presented last month with a ventral incisional hernia.  She had undergone a colon resection for diverticulitis in July 2001.  Soon after surgery, however, she was treated for pneumonia, during which severe coughing episodes occurred and the hernia occurred at that time.  It has enlarged.  It is now often as large as a grapefruit, and bothers her considerably with pain and discomfort.  PAST MEDICAL AND SURGICAL HISTORY:  The patient has no gastrointestinal history at this time.  She takes Metamucil, bowels move well.  There is no constipation at this point.  She does have a history of esophageal stricture with hiatal hernia and reflux.  She takes Prevacid.  The patient has a history of arthritis and takes Celebrex.  She has seasonal allergies and uses inhalers.  She has rare migraine headaches, and uses Tylox, Midrin, and Phenergan at that time.  The patient has a history of childbirth in 1969 and removal of a salivary gland in 1980.  She is postmenopausal at age 40.  She is on no replacement hormones, and she is a gravida 1, para 1.  Pap smear in 1998.  Mammogram in 2000.  FAMILY HISTORY:  Father died of diabetes and heart disease.  Sister died of pancreatic cancer.  Mother is alive and well.  SOCIAL HISTORY:  The patient does not smoke and does not drink.  MEDICATIONS:  As listed above.  ALLERGIES:  No known drug allergies.  PHYSICAL EXAMINATION:  GENERAL:  A pleasant woman of stated age.  Her weight is 206 pounds.  VITAL SIGNS:  Within normal limits today.  HEENT:  Unremarkable.  NECK:  Free of masses.  CHEST:  Clear  to auscultation.  BREASTS:  She did not wish to have a breast examination at this time.  HEART:  Sounds of normal quality.  ABDOMEN:  Grossly obese.  There is a palpable and reducible ventral incisional hernia.  No other masses are noted or abnormalities.  EXTREMITIES:  Within normal limits.  NEUROLOGIC:  Grossly within normal limits.  FINAL DIAGNOSIS:  Ventral incisional hernia.  PLAN:  Surgical repair. DD:  04/16/00 TD:  04/17/00 Job: [id number]

## 2010-07-26 NOTE — Op Note (Signed)
Megan, Walker              ACCOUNT NO.:  [id number]   MEDICAL RECORD NO.:  [id number]           PATIENT TYPE:   LOCATION:                                 FACILITY:   PHYSICIAN:  Jene Every, M.D.    DATE OF BIRTH:  1940-04-12   DATE OF PROCEDURE:  04/20/2006  DATE OF DISCHARGE:                               OPERATIVE REPORT   PREOPERATIVE DIAGNOSIS:  AC arthrosis, right.   POSTOPERATIVE DIAGNOSIS:  AC arthrosis, right.   PROCEDURE PERFORMED:  1. Distal clavicle resection.  2. A debridement of the acromioclavicular joint.  3. Acromioplasty.   ANESTHESIA:  General.   ASSISTANT:  Roma Schanz, P.A.   BRIEF HISTORY/INDICATIONS:  This a 70 year old female with right  shoulder pain secondary to [facility] arthrosis refractory to conservative  treatment including corticosteroid injection and activity modification.  She had an [facility] joint that indicated significant arthrosis.  She was  indicated distal clavicle resection with partial relief from her  corticosteroid injection.  No evidence of rotator cuff arthropathy.  She  had no impingement and pain on exam.  Due to the presence of significant  edema in the [facility] joint; it was felt that she would benefit by a distal  clavicle resection and debridement.  The risks and benefits were  discussed; including bleeding, infection, __________, CSF leak, damage  to neurovascular structures, suboptimal range of motion, need for  revision in the future, etcetera.   TECHNIQUE:  Patient supine beach-chair position after the induction of  adequate general anesthesia and 2 grams of Kefzol, the right shoulder  and upper extremity was prepped and draped in the usual sterile fashion.  Incision was made over the anterior aspect of the acromion in [employer].  Subcutaneous tissue was dissected __________ to achieve  hemostasis.  There was ample subcutaneous fatty tissue.  The [facility] joint  was identified.  I divided the AC capsule longitudinally,  and  subperiosteally elevated it from the distal clavicle, and the  corresponding acromion.  Retractors were placed exuberant hypertrophic  synovitis; and degenerative changes were noted in the [facility] joint; and this  was debrided.  After skeletonizing the distal clavicle we used an  oscillating saw to remove the distal centimeter of that.  Protected the  rotator cuff.  An inferior spur off the clavicle was removed with a 3-mm  Kerrison.  This was performed anteriorly and posteriorly.  There was a  small inferior leaf projecting a spur off the acromion.  This was  removed with a 3-mm Kerrison, performing an acromial side acromioplasty.   The rotator cuff was examined beneath it; there was no evidence of tear.  This was then copiously irrigated.  There was good stability of the  distal clavicle remaining as we did not interfere with the  coracoclavicular ligaments.  Bone wax was placed on the distal clavicle.  After copious irrigation, and I repaired the capsule with #1 Vicryl  figure-of-eight sutures.  Subcutaneous tissue reapproximated with 2-0  Vicryl simple sutures, and the deltotrapezial fascia was closed as well.  Skin was closed with a 4-0 subcuticular Prolene  reinforced with Steri-  Strips.  Sterile dressing applied, placed in a sling, extubated without  difficulty, and transported to the recovery room in satisfactory  condition.   The patient will proceed home, no complications.  We infiltrated with  1/4% Marcaine with epinephrine prior to this.  Minimal blood loss.      Jene Every, M.D.  Electronically Signed     JB/MEDQ  D:  04/20/2006  T:  04/20/2006  Job:  [id number]

## 2010-12-02 LAB — POCT CARDIAC MARKERS
Myoglobin, poc: 41.2
Operator id: 294521
Operator id: 294521
Troponin i, poc: <0.05

## 2010-12-02 LAB — DIFFERENTIAL
Basophils Absolute: 0
Eosinophils Absolute: 0.1
Eosinophils Relative: 1
Lymphocytes Relative: 23
Lymphs Abs: 1.8
Neutrophils Relative %: 70

## 2010-12-02 LAB — LIPID PANEL
Cholesterol: 200
LDL Cholesterol: 136 — ABNORMAL HIGH
Triglycerides: 100
VLDL: 20

## 2010-12-02 LAB — POCT I-STAT, CHEM 8
BUN: 10
Calcium, Ion: 1.16
Creatinine, Ser: 1
Hemoglobin: 15
Sodium: 142
TCO2: 27

## 2010-12-02 LAB — HEPATIC FUNCTION PANEL
Bilirubin, Direct: 0.1
Indirect Bilirubin: 1 — ABNORMAL HIGH
Total Bilirubin: 1.1

## 2010-12-02 LAB — CBC
Platelets: 279
RDW: 13.9
WBC: 7.8

## 2010-12-02 LAB — AMYLASE: Amylase: 34

## 2010-12-02 LAB — CARDIAC PANEL(CRET KIN+CKTOT+MB+TROPI)
CK, MB: 1.5
Relative Index: INVALID
Total CK: 90
Troponin I: 0.03

## 2011-01-16 ENCOUNTER — Encounter: Payer: Self-pay | Admitting: Gastroenterology

## 2011-02-06 ENCOUNTER — Ambulatory Visit (INDEPENDENT_AMBULATORY_CARE_PROVIDER_SITE_OTHER): Payer: Medicare Other | Admitting: Gastroenterology

## 2011-02-06 ENCOUNTER — Other Ambulatory Visit (INDEPENDENT_AMBULATORY_CARE_PROVIDER_SITE_OTHER): Payer: Medicare Other

## 2011-02-06 ENCOUNTER — Encounter: Payer: Self-pay | Admitting: Gastroenterology

## 2011-02-06 DIAGNOSIS — Z9889 Other specified postprocedural states: Secondary | ICD-10-CM

## 2011-02-06 DIAGNOSIS — K219 Gastro-esophageal reflux disease without esophagitis: Secondary | ICD-10-CM

## 2011-02-06 DIAGNOSIS — Z860100 Personal history of colon polyps, unspecified: Secondary | ICD-10-CM

## 2011-02-06 DIAGNOSIS — Z79899 Other long term (current) drug therapy: Secondary | ICD-10-CM

## 2011-02-06 DIAGNOSIS — Z800 Family history of malignant neoplasm of digestive organs: Secondary | ICD-10-CM | POA: Insufficient documentation

## 2011-02-06 DIAGNOSIS — E669 Obesity, unspecified: Secondary | ICD-10-CM | POA: Insufficient documentation

## 2011-02-06 DIAGNOSIS — D126 Benign neoplasm of colon, unspecified: Secondary | ICD-10-CM

## 2011-02-06 DIAGNOSIS — Z9049 Acquired absence of other specified parts of digestive tract: Secondary | ICD-10-CM

## 2011-02-06 LAB — CBC WITH DIFFERENTIAL/PLATELET
Basophils Absolute: 0 10*3/uL (ref 0.0–0.1)
HCT: 41.9 % (ref 36.0–46.0)
Lymphs Abs: 1.9 10*3/uL (ref 0.7–4.0)
Monocytes Absolute: 0.6 10*3/uL (ref 0.1–1.0)
Monocytes Relative: 9.2 % (ref 3.0–12.0)
Neutrophils Relative %: 61.4 % (ref 43.0–77.0)
Platelets: 277 10*3/uL (ref 150.0–400.0)
RDW: 15.2 % — ABNORMAL HIGH (ref 11.5–14.6)

## 2011-02-06 LAB — FERRITIN: Ferritin: 49.9 ng/mL (ref 10.0–291.0)

## 2011-02-06 LAB — HEPATIC FUNCTION PANEL
ALT: 15 U/L (ref 0–35)
AST: 16 U/L (ref 0–37)
Albumin: 4 g/dL (ref 3.5–5.2)
Alkaline Phosphatase: 62 U/L (ref 39–117)

## 2011-02-06 LAB — BASIC METABOLIC PANEL
BUN: 15 mg/dL (ref 6–23)
CO2: 30 mEq/L (ref 19–32)
Chloride: 106 mEq/L (ref 96–112)
Creatinine, Ser: 0.9 mg/dL (ref 0.4–1.2)
Glucose, Bld: 93 mg/dL (ref 70–99)
Potassium: 5.2 mEq/L — ABNORMAL HIGH (ref 3.5–5.1)

## 2011-02-06 LAB — FOLATE: Folate: 16.8 ng/mL (ref >5.9)

## 2011-02-06 LAB — TSH: TSH: 2.28 u[IU]/mL (ref 0.35–5.50)

## 2011-02-06 LAB — VITAMIN B12: Vitamin B-12: 188 pg/mL — ABNORMAL LOW (ref 211–911)

## 2011-02-06 MED ORDER — PEG-KCL-NACL-NASULF-NA ASC-C 100 G PO SOLR: 1.0000 | Freq: Once | ORAL | Status: DC

## 2011-02-06 MED ORDER — ESOMEPRAZOLE MAGNESIUM 40 MG PO CPDR: 40.0000 mg | DELAYED_RELEASE_CAPSULE | Freq: Two times a day (BID) | ORAL | Status: DC

## 2011-02-06 NOTE — Progress Notes (Signed)
This is a very nice 70 year old Caucasian female with chronic acid reflux fairly well controlled on Nexium 40 mg twice a day. She continues with some regurgitation, but denies dysphagia. She has a past history of recurrent diverticulitis requiring sigmoid resection and by Dr. Kirstie Mirza in 2001. She had postoperative problems with incisional hernia, had repeat surgery and mesh implantation. The patient had colonoscopy in 2009 with removal of an adenomatous polyp. She has an older sister recently diagnosed with colon cancer.  This patient's medical care is complicated by severe migraine headaches followed by Dr. Madelon Lips at the headache center. She does use when necessary metoclopramide, oxycodone, and Phenergan. Currently she is having fairly regular bowel movements without melena or hematochezia. Her appetite is good her weight is stable. She denies any specific food intolerances.  Current Medications, Allergies, Past Medical History, Past Surgical History, Family History and Social History were reviewed in [employer] record.  Pertinent Review of Systems Negative... this patient has a detailed list of her medical history, physicians, and medications that was reviewed and placed in her chart.   Physical Exam: Obese patient in no acute distress appears stated age. I cannot appreciate stigmata of chronic liver disease. Chest is clear and she is in a regular rhythm without murmurs gallops or rubs. Her abdomen shows no definite organomegaly, masses or tenderness. She has a well-healed midline abdominal scar. Bowel sounds are normal. Rectal exam is deferred. Mental status is normal. There is no peripheral edema, phlebitis, or swollen joints.    Assessment and Plan: Refractory GERD, rule out Barrett's mucosa. I have scheduled her for endoscopy at her convenience. She is due for followup colonoscopy which also has been scheduled with propofol sedation. I have reviewed reflux regime with  the patient, and I have advised each bedtime Reglan as tolerated for regurgitation and associated ENT problems. She apparently has seen Dr. Luretha Murphy in the past for consideration of fundoplication surgery. The patient does have a history of pancreatic cancer in her family, also family history of cholelithiasis, but has had negative CT scans and ultrasounds without evidence of hepatobiliary disease. She has suffered from chronic exogenous obesity. Encounter Diagnoses  Name Primary?  . Personal history of colonic polyps   . Esophageal reflux

## 2011-02-06 NOTE — Patient Instructions (Signed)
Please go to the basement today for your labs.  Your procedure has been scheduled for 02/12/2011 please follow the seperate instructions.  Propofol handout given. Nexium rx sent to [facility] for 90 days.  Movi Prep sent to your local pharmacy.

## 2011-02-07 ENCOUNTER — Encounter: Payer: Self-pay | Admitting: Gastroenterology

## 2011-02-07 ENCOUNTER — Other Ambulatory Visit: Payer: Self-pay | Admitting: *Deleted

## 2011-02-10 ENCOUNTER — Telehealth: Payer: Self-pay | Admitting: *Deleted

## 2011-02-10 NOTE — Telephone Encounter (Signed)
Message copied by Leonette Monarch on Mon Feb 10, 2011 11:28 AM ------      Message from: Jarold Motto, DAVID R      Created: Fri Feb 07, 2011  8:34 AM       Needs B12 shots per protocol

## 2011-02-11 NOTE — Telephone Encounter (Signed)
Pt advised that she needs b12 injections once a week for 3 weeks then once a month for a year, she would like to call her insurance company see if they cover b12 injections. She will call back to schedule the injections but explained she can not have them on the same day of her procedure.

## 2011-02-12 ENCOUNTER — Ambulatory Visit (AMBULATORY_SURGERY_CENTER): Payer: Medicare Other | Admitting: Gastroenterology

## 2011-02-12 ENCOUNTER — Encounter: Payer: Self-pay | Admitting: Gastroenterology

## 2011-02-12 DIAGNOSIS — Z800 Family history of malignant neoplasm of digestive organs: Secondary | ICD-10-CM

## 2011-02-12 DIAGNOSIS — D131 Benign neoplasm of stomach: Secondary | ICD-10-CM

## 2011-02-12 DIAGNOSIS — K219 Gastro-esophageal reflux disease without esophagitis: Secondary | ICD-10-CM

## 2011-02-12 DIAGNOSIS — K317 Polyp of stomach and duodenum: Secondary | ICD-10-CM

## 2011-02-12 DIAGNOSIS — K5730 Diverticulosis of large intestine without perforation or abscess without bleeding: Secondary | ICD-10-CM

## 2011-02-12 DIAGNOSIS — Z86010 Personal history of colonic polyps: Secondary | ICD-10-CM

## 2011-02-12 MED ORDER — SODIUM CHLORIDE 0.9 % IV SOLN: 500.0000 mL | INTRAVENOUS | Status: DC

## 2011-02-12 NOTE — Patient Instructions (Signed)
FOLLOW THE DISCHARGE INSTRUCTIONS ON THE GREEN AND BLUE INSTRUCTION SHEETS.  CONTINUE YOUR MEDICATIONS.  NEXT COLONOSCOPY IN 5 YEARS, 2017.

## 2011-02-12 NOTE — Op Note (Signed)
[facility] [address] [place], [place]  [address]  COLONOSCOPY PROCEDURE REPORT  PATIENT:  Megan, Walker  MR#:  [id number] BIRTHDATE:  [birth year], 70 yrs. old  GENDER:  female ENDOSCOPIST:  Vania Rea. Jarold Motto, MD, [facility] REF. BY: PROCEDURE DATE:  02/12/2011 PROCEDURE:  Diagnostic Colonoscopy ASA CLASS:  Class II INDICATIONS:  family history of colon cancer SISTER.PRIOR PARTIAL COLECTOMY. MEDICATIONS:   propofol (Diprivan) 200 mg IV  DESCRIPTION OF PROCEDURE:   After the risks and benefits and of the procedure were explained, informed consent was obtained. Digital rectal exam was performed and revealed no abnormalities. The [id number] endoscope was introduced through the anus and advanced to the cecum, which was identified by both the appendix and ileocecal valve.  The quality of the prep was excellent, using MoviPrep.  The instrument was then slowly withdrawn as the colon was fully examined. <<PROCEDUREIMAGES>>  FINDINGS:  There were mild diverticular changes in left colon. diverticulosis was found.  No polyps or cancers were seen. Retroflexed views in the rectum revealed no abnormalities.    The scope was then withdrawn from the patient and the procedure completed.  COMPLICATIONS:  None ENDOSCOPIC IMPRESSION: 1) Diverticulosis,mild,left sided diverticulosis 2) No polyps or cancers RECOMMENDATIONS: 1) High fiber diet. 2) Given your significant family history of colon cancer, you should have a repeat colonoscopy in 5 years  REPEAT EXAM:  No  ______________________________ Vania Rea. Jarold Motto, MD, [facility]  CC:  Beverley Fiedler, MD  n. Rosalie DoctorMarland Kitchen   Vania Rea. Tremar Wickens at 02/12/2011 03:15 PM  Lawernce Keas, [phone]

## 2011-02-12 NOTE — Op Note (Signed)
[facility] [address] [place], [place]  [address]  ENDOSCOPY PROCEDURE REPORT  PATIENT:  Megan, Walker  MR#:  [id number] BIRTHDATE:  03/28/1940, 70 yrs. old  GENDER:  female  ENDOSCOPIST:  Vania Rea. Jarold Motto, MD, [facility] Referred by:  PROCEDURE DATE:  02/12/2011 PROCEDURE:  EGD with biopsy, 19147 ASA CLASS:  Class II INDICATIONS:  GERD  MEDICATIONS:   There was residual sedation effect present from prior procedure., propofol (Diprivan) 150 mg IV TOPICAL ANESTHETIC:  DESCRIPTION OF PROCEDURE:   After the risks and benefits of the procedure were explained, informed consent was obtained.  The [facility] GIF-[id number] [id number] endoscope was introduced through the mouth and advanced to the second portion of the duodenum.  The instrument was slowly withdrawn as the mucosa was fully examined. <<PROCEDUREIMAGES>>  There were multiple polyps identified. in the fundus. MULTIPLE 2-5 MM FLAT FUNDAL POLYPS,SOME BIOPSIED.  Otherwise the examination was normal. NO LARGE HH,STRICTURE,EROSIONS NOTED.    Retroflexed views revealed no masses.    The scope was then withdrawn from the patient and the procedure completed.  COMPLICATIONS:  None  ENDOSCOPIC IMPRESSION: 1) Polyps, multiple in the fundus 2) Otherwise normal examination 3) No masses 1.TREATED GERD 2.BENIGN FUNDAL POLYPS FROM PPI USE. RECOMMENDATIONS: 1) Await biopsy results 2) continue PPI  ______________________________ Vania Rea. Jarold Motto, MD, [facility]  CC:  Beverley Fiedler, MD  n. Rosalie DoctorMarland Kitchen   Vania Rea. Witney Huie at 02/12/2011 03:21 PM  Lawernce Keas, [phone]

## 2011-02-12 NOTE — Progress Notes (Signed)
Patient did not experience any of the following events: a burn prior to discharge; a fall within the facility; wrong site/side/patient/procedure/implant event; or a hospital transfer or hospital admission upon discharge from the facility. (G8907) Patient did not have preoperative order for IV antibiotic SSI prophylaxis. (G8918)  

## 2011-02-12 NOTE — Progress Notes (Signed)
Propofol administered per m smith crna. See scanned intra procedure report. ewm 

## 2011-02-13 ENCOUNTER — Telehealth: Payer: Self-pay

## 2011-02-13 ENCOUNTER — Telehealth: Payer: Self-pay | Admitting: Gastroenterology

## 2011-02-13 NOTE — Telephone Encounter (Signed)
When patient was in the office she asked for her Nexium rx to be filled to her [facility] for [facility] day, so I sent it in electronically. Later before leaving she asked for the rx to be held until the first of the year. Later that afternoon I called [facility] and asked for them to hold the rx. She is now calling and stating that [facility] filled the rx and its $900. She wouldl like to come pick up this note to fax to the insurance company. I will print it and leave it up front for her. She was given samples for 20 days to try and last her until she can afford to get the Nexium filled.

## 2011-02-13 NOTE — Telephone Encounter (Signed)

## 2011-02-17 ENCOUNTER — Encounter: Payer: Self-pay | Admitting: Gastroenterology

## 2011-03-05 ENCOUNTER — Telehealth: Payer: Self-pay | Admitting: Gastroenterology

## 2011-03-05 NOTE — Telephone Encounter (Signed)
Advised [name] that i cxed pts order with [facility] the day pt was in office for nexium and resent it to her local pharm. But i am unaware who i spoke with.

## 2011-03-24 ENCOUNTER — Encounter: Payer: Self-pay | Admitting: Cardiology

## 2011-04-22 ENCOUNTER — Encounter: Payer: Self-pay | Admitting: *Deleted

## 2011-04-23 ENCOUNTER — Ambulatory Visit (INDEPENDENT_AMBULATORY_CARE_PROVIDER_SITE_OTHER): Payer: Medicare Other | Admitting: Cardiology

## 2011-04-23 ENCOUNTER — Encounter: Payer: Self-pay | Admitting: Cardiology

## 2011-04-23 DIAGNOSIS — R002 Palpitations: Secondary | ICD-10-CM | POA: Insufficient documentation

## 2011-04-23 DIAGNOSIS — R0989 Other specified symptoms and signs involving the circulatory and respiratory systems: Secondary | ICD-10-CM

## 2011-04-23 DIAGNOSIS — E785 Hyperlipidemia, unspecified: Secondary | ICD-10-CM

## 2011-04-23 DIAGNOSIS — R0609 Other forms of dyspnea: Secondary | ICD-10-CM

## 2011-04-23 DIAGNOSIS — R0600 Dyspnea, unspecified: Secondary | ICD-10-CM | POA: Insufficient documentation

## 2011-04-23 DIAGNOSIS — R079 Chest pain, unspecified: Secondary | ICD-10-CM

## 2011-04-23 NOTE — Assessment & Plan Note (Signed)
Occasional episodes of heart racing.  I will get a 48 hour holter monitor to assess for any worrisome arrhythmia.

## 2011-04-23 NOTE — Progress Notes (Signed)
PCP: Dr. Barbaraann Barthel  71 yo with history of HTN, GERD, and migraines presents for cardiology evaluation.  She has several symptoms.  On occasion, she will feel her heart race. There is no trigger.  Episodes will last for a few minutes.  Palpitations were actually worse last fall after her colonoscopy, and she blames this on the colonoscopy prep.  No syncope or significant lightheadedness.  She also reports increased dyspnea. She for the last few months, she has been short of breath walking to her mailbox (about 300 feet).  She is short of breath walking up steps.  She also has significant back pain which limits her ambulation.  She gets lower chest aching that radiates to her back.  The chest pain sometimes occurs with exertion and sometimes occurs at rest.  BP has been running high at times, sometimes up to 150s systolic.  However, .it fluctuates a lot and sometimes runs rather low.  LDL was very high last time it was checked back in 3/12.     ECG: NSR, normal (from PCP's office)  PMH: 1. GERD with esophageal stricture and hiatal hernia.   2. Low back pain 3. Hyperlipidemia 4. HTN 5. Migraines.  6. Echo (5/11): EF 60-65%, no significant valvular abnormalities.  7. Scoliosis 8. B12 deficiency 9. Osteoarthritis 10. Diverticulitis with surgery in 2001  SH: Lives in [place], married, nonsmoker.   FH: Father with "heart trouble"  ROS: All systems reviewed and negative except as per HPI.   ROS: All systems reviewed and negative except as per HPI

## 2011-04-23 NOTE — Assessment & Plan Note (Signed)
LDL was 170 in 3/12.  She is now on atorvastatin.  Followed by PCP.

## 2011-04-23 NOTE — Assessment & Plan Note (Signed)
Patient has had some exertional dyspnea for years.  It has been worse for several months.  She had an echo in 5/11 with normal EF and no significant valvular abnormalities.  It is certainly possible that her dyspnea is due to obesity/deconditioning.  I will check a BNP.  Given exertional dyspnea + chest pain with both typical and atypical features, I will get a Lexiscan myoview.

## 2011-04-23 NOTE — Patient Instructions (Signed)
Your physician has recommended that you wear a holter monitor. Holter monitors are medical devices that record the heart's electrical activity. Doctors most often use these monitors to diagnose arrhythmias. Arrhythmias are problems with the speed or rhythm of the heartbeat. The monitor is a small, portable device. You can wear one while you do your normal daily activities. This is usually used to diagnose what is causing palpitations/syncope (passing out). 48 hour  Your physician has requested that you have a lexiscan myoview. For further information please visit [email or web address] Please follow instruction sheet, as given.  Your physician recommends that you have lab work today--BNP   Your physician recommends that you schedule a follow-up appointment in: about 2 weeks with Dr Shirlee Latch.  Take and record your blood pressure daily. Bring these readings to your appointment with Dr Shirlee Latch in 2 weeks.

## 2011-05-05 ENCOUNTER — Other Ambulatory Visit (HOSPITAL_COMMUNITY): Payer: Medicare Other

## 2011-05-07 ENCOUNTER — Ambulatory Visit: Payer: Medicare Other | Admitting: Cardiology

## 2011-05-08 ENCOUNTER — Ambulatory Visit (HOSPITAL_COMMUNITY): Payer: Medicare Other | Attending: Cardiology | Admitting: Radiology

## 2011-05-08 ENCOUNTER — Encounter (INDEPENDENT_AMBULATORY_CARE_PROVIDER_SITE_OTHER): Payer: Medicare Other

## 2011-05-08 DIAGNOSIS — R079 Chest pain, unspecified: Secondary | ICD-10-CM | POA: Insufficient documentation

## 2011-05-08 DIAGNOSIS — R002 Palpitations: Secondary | ICD-10-CM

## 2011-05-08 DIAGNOSIS — R5381 Other malaise: Secondary | ICD-10-CM | POA: Insufficient documentation

## 2011-05-08 DIAGNOSIS — R5383 Other fatigue: Secondary | ICD-10-CM | POA: Insufficient documentation

## 2011-05-08 DIAGNOSIS — R000 Tachycardia, unspecified: Secondary | ICD-10-CM | POA: Insufficient documentation

## 2011-05-08 DIAGNOSIS — E785 Hyperlipidemia, unspecified: Secondary | ICD-10-CM | POA: Insufficient documentation

## 2011-05-08 DIAGNOSIS — R0989 Other specified symptoms and signs involving the circulatory and respiratory systems: Secondary | ICD-10-CM | POA: Insufficient documentation

## 2011-05-08 DIAGNOSIS — I10 Essential (primary) hypertension: Secondary | ICD-10-CM | POA: Insufficient documentation

## 2011-05-08 DIAGNOSIS — R0602 Shortness of breath: Secondary | ICD-10-CM

## 2011-05-08 DIAGNOSIS — R0600 Dyspnea, unspecified: Secondary | ICD-10-CM

## 2011-05-08 DIAGNOSIS — Z8249 Family history of ischemic heart disease and other diseases of the circulatory system: Secondary | ICD-10-CM | POA: Insufficient documentation

## 2011-05-08 DIAGNOSIS — R0609 Other forms of dyspnea: Secondary | ICD-10-CM | POA: Insufficient documentation

## 2011-05-08 MED ORDER — REGADENOSON 0.4 MG/5ML IV SOLN
0.4000 mg | Freq: Once | INTRAVENOUS | Status: AC
  Administered 2011-05-08: 0.4 mg via INTRAVENOUS

## 2011-05-08 MED ORDER — TECHNETIUM TC 99M TETROFOSMIN IV KIT
33.0000 | PACK | Freq: Once | INTRAVENOUS | Status: AC | PRN
  Administered 2011-05-08: 33 via INTRAVENOUS

## 2011-05-08 MED ORDER — TECHNETIUM TC 99M TETROFOSMIN IV KIT
10.9000 | PACK | Freq: Once | INTRAVENOUS | Status: AC | PRN
  Administered 2011-05-08: 10.9 via INTRAVENOUS

## 2011-05-08 NOTE — Progress Notes (Addendum)
[name] SITE 3 NUCLEAR MED [address] [place] [address] [phone]  Cardiology Nuclear Med Study  [name] is a 71 y.o. female [id number] 07-18-1940   Nuclear Med Background Indication for Stress Test:  Evaluation for Ischemia History:5/11' Echo EF 60-65% and 3/09' Myocardial Perfusion Study [name] (-)isch EF 83% (adenosine) Cardiac Risk Factors: Family History - CAD, Hypertension and Lipids  Symptoms:  Chest Pain, Chest Pain with Exertion (last date of chest discomfort x2 weeks ago), Chest Tightness (last date of chest discomfort x2 weeks ago), DOE, Fatigue, Palpitations, Rapid HR and SOB   Nuclear Pre-Procedure Caffeine/Decaff Intake:  None> 12hrs NPO After: 7:00pm   Lungs:  clear IV 0.9% NS with Angio Cath:  22g  IV Site: L Forearm x 1, tolerated well IV Started by:  Megan Hong, RN  Chest Size (in):  42 Cup Size: D  Height: 5' (1.524 m)  Weight:  190 lb (86.183 kg)  BMI:  Body mass index is 37.11 kg/(m^2). Tech Comments:  n/a    Nuclear Med Study 1 or 2 day study: 1 day  Stress Test Type:  Lexiscan  Reading MD: Megan Ancona, MD  Order Authorizing Provider:  Marca Ancona, MD  Resting Radionuclide: Technetium 65m Tetrofosmin  Resting Radionuclide Dose: 10.9 mCi   Stress Radionuclide:  Technetium 104m Tetrofosmin  Stress Radionuclide Dose: 33.0 mCi           Stress Protocol Rest HR: 66 Stress HR: 107  Rest BP: 118/60 Stress BP: 137/57  Exercise Time (min): n/a METS: n/a   Predicted Max HR: 150 bpm % Max HR: 71.33 bpm Rate Pressure Product: 21308   Dose of Adenosine (mg):  n/a Dose of Lexiscan: 0.4 mg  Dose of Atropine (mg): n/a Dose of Dobutamine: n/a mcg/kg/min (at max HR)  Stress Test Technologist: [name], EMT-P  Nuclear Technologist:  [name], CNMT     Rest Procedure:  Myocardial perfusion imaging was performed at rest 45 minutes following the intravenous administration of Technetium 69m Tetrofosmin. Rest  ECG: NSR  Stress Procedure:  The patient received IV Lexiscan 0.4 mg over 15-seconds.  Technetium 49m Tetrofosmin injected at 30-seconds.  There were no significant changes with Lexiscan.  Quantitative spect images were obtained after a 45 minute delay. Stress ECG: No significant change from baseline ECG  QPS Raw Data Images:  There is a breast shadow that accounts for the anterior attenuation. Stress Images:  Normal homogeneous uptake in all areas of the myocardium. Rest Images:  Normal homogeneous uptake in all areas of the myocardium. Subtraction (SDS):  There is no evidence of scar or ischemia. Transient Ischemic Dilatation (Normal <1.22):  1.16 Lung/Heart Ratio (Normal <0.45):  0.41  Quantitative Gated Spect Images QGS EDV:  55 ml QGS ESV:  11 ml QGS cine images:  NL LV Function; NL Wall Motion QGS EF: 80%  Impression Exercise Capacity:  Lexiscan with no exercise. BP Response:  Normal blood pressure response. Clinical Symptoms:  Nausea, headache ECG Impression:  No significant ST segment change suggestive of ischemia. Comparison with Prior Nuclear Study: No images to compare  Overall Impression:  Normal stress nuclear study.  Mild anteroapical attenuation from breast shadow.   [name]   Suspect this was a normal study.   [name] 05/09/2011

## 2011-05-09 NOTE — Progress Notes (Signed)
appt 05/13/11 with Dr Shirlee Latch

## 2011-05-10 ENCOUNTER — Encounter: Payer: Self-pay | Admitting: Cardiology

## 2011-05-13 ENCOUNTER — Encounter: Payer: Self-pay | Admitting: Cardiology

## 2011-05-13 ENCOUNTER — Ambulatory Visit (INDEPENDENT_AMBULATORY_CARE_PROVIDER_SITE_OTHER): Payer: Medicare Other | Admitting: Cardiology

## 2011-05-13 VITALS — BP 155/86 | HR 78 | Ht 60.0 in | Wt 193.0 lb

## 2011-05-13 DIAGNOSIS — R0609 Other forms of dyspnea: Secondary | ICD-10-CM

## 2011-05-13 DIAGNOSIS — R002 Palpitations: Secondary | ICD-10-CM

## 2011-05-13 DIAGNOSIS — R0989 Other specified symptoms and signs involving the circulatory and respiratory systems: Secondary | ICD-10-CM

## 2011-05-13 DIAGNOSIS — R0600 Dyspnea, unspecified: Secondary | ICD-10-CM

## 2011-05-13 DIAGNOSIS — I10 Essential (primary) hypertension: Secondary | ICD-10-CM | POA: Insufficient documentation

## 2011-05-13 NOTE — Assessment & Plan Note (Signed)
Improved.  Probably due to PACs.  Holter showed only occasional PACs.

## 2011-05-13 NOTE — Assessment & Plan Note (Signed)
BP running high.  Increase lisinopril/HCTZ to 20/25 mg daily.  BMET in 2 wks.

## 2011-05-13 NOTE — Progress Notes (Signed)
PCP: Dr. Barbaraann Barthel  71 yo with history of HTN, GERD, and migraines returns for cardiology followup.  She has had several symptoms.  On occasion, she will feel her heart race. There is no trigger.  Episodes will last for a few minutes.  Palpitations were actually worse last fall after her colonoscopy, and she blames this on the colonoscopy prep.  No syncope or significant lightheadedness.  She also reports increased dyspnea. She for the last few months, she has been short of breath walking to her mailbox (about 300 feet).  She is short of breath walking up steps.  She also has significant back pain which limits her ambulation.  She gets lower chest aching that radiates to her back.  The chest pain sometimes occurs with exertion and sometimes occurs at rest.  BP has been running high at times, sometimes up to 150s systolic.  However, it fluctuates a lot and sometimes runs rather low.  LDL was very high last time it was checked back in 3/12.     I had her do a 48 hour holter monitor which showed occasional PACs.  She also did a [place] that showed normal EF with breast attenuation and no ischemia or infarction.  She has been avoiding caffeine.  Palpitations are actually rather rare now.  BP has been running high; it is 155/86 today.   Labs (2/13): BNP 35  PMH: 1. GERD with esophageal stricture and hiatal hernia.   2. Low back pain 3. Hyperlipidemia 4. HTN 5. Migraines.  6. Echo (5/11): EF 60-65%, no significant valvular abnormalities.  7. Scoliosis 8. B12 deficiency 9. Osteoarthritis 10. Diverticulitis with surgery in 2001 11. Atypical chest pain: Lexiscan myoview (2/13) with EF 80%, breast attenuation, no evidence for ischemia or infarction.  12. Palpitations: Holter (2/13) with occasional PACs.   SH: Lives in [place], married, nonsmoker.   FH: Father with "heart trouble"  Current Outpatient Prescriptions  Medication Sig Dispense Refill  . atorvastatin (LIPITOR) 20 MG tablet Take  20 mg by mouth daily.        . Cyanocobalamin (VITAMIN B-12 IJ) Inject as directed every 30 (thirty) days.      [name] diltiazem (DILACOR XR) 240 MG 24 hr capsule Take 240 mg by mouth daily.        [name] esomeprazole (NEXIUM) 40 MG capsule Take 1 capsule (40 mg total) by mouth 2 (two) times daily.  180 capsule  3  . lisinopril-hydrochlorothiazide (PRINZIDE,ZESTORETIC) 20-25 MG per tablet Take 1 tablet by mouth daily.        . metoCLOPramide (REGLAN) 10 MG tablet Take 10 mg by mouth 2 (two) times daily.        [name] oxyCODONE-acetaminophen (TYLOX) 5-500 MG per capsule Take 1 capsule by mouth every 4 (four) hours as needed.        . promethazine (PHENERGAN) 25 MG suppository Place 25 mg rectally every 6 (six) hours as needed.        . promethazine (PHENERGAN) 25 MG tablet Take 25 mg by mouth 3 (three) times daily as needed.          BP 155/86  Pulse 78  Ht 5' (1.524 m)  Wt 193 lb (87.544 kg)  BMI 37.69 kg/m2 General: NAD, obese.  Neck: No JVD, no thyromegaly or thyroid nodule.  Lungs: Clear to auscultation bilaterally with normal respiratory effort. CV: Nondisplaced PMI.  Heart regular S1/S2, no S3/S4, no murmur.  No peripheral edema.  No carotid bruit.  Normal pedal pulses.  Abdomen: Soft, nontender, no hepatosplenomegaly, no distention.  Neurologic: Alert and oriented x 3.  Psych: Normal affect. Extremities: No clubbing or cyanosis.

## 2011-05-13 NOTE — Patient Instructions (Signed)
Take lisinopril/HCT 20-25mg  daily.  Your physician recommends that you return for lab work in: 2 weeks --BMET 401.9  Your physician wants you to follow-up in: 1 year with Dr Shirlee Latch. ( March 2014). You will receive a reminder letter in the mail two months in advance. If you don't receive a letter, please call our office to schedule the follow-up appointment.

## 2011-05-13 NOTE — Assessment & Plan Note (Signed)
No ischemia or infarction on myoview.  I suspect that the exertional dyspnea is primarily due to obesity and deconditioning.  She is limited by back and knee pain but I encouraged her to try to be more active, perhaps riding a stationary bike.

## 2011-05-26 ENCOUNTER — Other Ambulatory Visit (INDEPENDENT_AMBULATORY_CARE_PROVIDER_SITE_OTHER): Payer: Medicare Other

## 2011-05-26 DIAGNOSIS — I10 Essential (primary) hypertension: Secondary | ICD-10-CM

## 2011-05-26 LAB — BASIC METABOLIC PANEL
BUN: 15 mg/dL (ref 6–23)
Chloride: 103 mEq/L (ref 96–112)
GFR: 56.29 mL/min — ABNORMAL LOW (ref >60.00)
Glucose, Bld: 100 mg/dL — ABNORMAL HIGH (ref 70–99)
Potassium: 3.7 mEq/L (ref 3.5–5.1)

## 2011-07-17 ENCOUNTER — Other Ambulatory Visit: Payer: Self-pay | Admitting: Neurosurgery

## 2011-07-17 DIAGNOSIS — M545 Low back pain, unspecified: Secondary | ICD-10-CM

## 2011-07-23 ENCOUNTER — Ambulatory Visit
Admission: RE | Admit: 2011-07-23 | Discharge: 2011-07-23 | Disposition: A | Discharge status: Home / Self Care | Payer: Medicare Other | Source: Ambulatory Visit | Attending: Neurosurgery | Admitting: Neurosurgery

## 2011-07-23 DIAGNOSIS — M545 Low back pain, unspecified: Secondary | ICD-10-CM

## 2011-08-06 ENCOUNTER — Other Ambulatory Visit: Payer: Self-pay | Admitting: Neurosurgery

## 2011-08-06 DIAGNOSIS — M545 Low back pain, unspecified: Secondary | ICD-10-CM

## 2011-08-06 DIAGNOSIS — M546 Pain in thoracic spine: Secondary | ICD-10-CM

## 2011-08-08 ENCOUNTER — Ambulatory Visit
Admission: RE | Admit: 2011-08-08 | Discharge: 2011-08-08 | Disposition: A | Discharge status: Home / Self Care | Payer: Medicare Other | Source: Ambulatory Visit | Attending: Neurosurgery | Admitting: Neurosurgery

## 2011-08-08 ENCOUNTER — Other Ambulatory Visit: Payer: Medicare Other

## 2011-08-08 DIAGNOSIS — M545 Low back pain, unspecified: Secondary | ICD-10-CM

## 2011-08-08 DIAGNOSIS — M546 Pain in thoracic spine: Secondary | ICD-10-CM

## 2011-08-29 ENCOUNTER — Other Ambulatory Visit: Payer: Self-pay | Admitting: Neurosurgery

## 2011-09-01 NOTE — Pre-Procedure Instructions (Signed)
20 [name]  09/01/2011   Your procedure is scheduled on: 09/03/11  Report to [facility] at 1330 AM.  Call this number if you have problems the morning of surgery: [phone]   Remember:   Do not eat food or drink:After Midnight.    Take these medicines the morning of surgery with A SIP OF WATER: diltiazem, nexium, reglan, pain med, phenergan  STOP any aspirin, nsaids, herbal meds, blood thinners   Do not wear jewelry, make-up or nail polish.  Do not wear lotions, powders, or perfumes. You may wear deodorant.  Do not shave 48 hours prior to surgery. Men may shave face and neck.  Do not bring valuables to the hospital.  Contacts, dentures or bridgework may not be worn into surgery.  Leave suitcase in the car. After surgery it may be brought to your room.  For patients admitted to the hospital, checkout time is 11:00 AM the day of discharge.   Patients discharged the day of surgery will not be allowed to drive home.  Name and phone number of your driver:   Special Instructions: CHG Shower Use Special Wash: 1/2 bottle night before surgery and 1/2 bottle morning of surgery.   Please read over the following fact sheets that you were given: Pain Booklet, Coughing and Deep Breathing, MRSA Information and Surgical Site Infection Prevention

## 2011-09-02 ENCOUNTER — Encounter (HOSPITAL_COMMUNITY)
Admission: RE | Admit: 2011-09-02 | Discharge: 2011-09-02 | Disposition: A | Discharge status: Home / Self Care | Payer: Medicare Other | Source: Ambulatory Visit | Attending: Anesthesiology | Admitting: Anesthesiology

## 2011-09-02 ENCOUNTER — Encounter (HOSPITAL_COMMUNITY)
Admission: RE | Admit: 2011-09-02 | Discharge: 2011-09-02 | Disposition: A | Discharge status: Home / Self Care | Payer: Medicare Other | Source: Ambulatory Visit | Attending: Neurosurgery | Admitting: Neurosurgery

## 2011-09-02 ENCOUNTER — Encounter (HOSPITAL_COMMUNITY): Payer: Self-pay

## 2011-09-02 HISTORY — DX: Fibromyalgia: M79.7

## 2011-09-02 HISTORY — DX: Pneumonia, unspecified organism: J18.9

## 2011-09-02 HISTORY — DX: Cardiac arrhythmia, unspecified: I49.9

## 2011-09-02 LAB — BASIC METABOLIC PANEL
BUN: 15 mg/dL (ref 6–23)
CO2: 22 mEq/L (ref 19–32)
Chloride: 97 mEq/L (ref 96–112)
Creatinine, Ser: 0.86 mg/dL (ref 0.50–1.10)

## 2011-09-02 LAB — CBC
HCT: 43.1 % (ref 36.0–46.0)
MCH: 28.4 pg (ref 26.0–34.0)
MCHC: 33.2 g/dL (ref 30.0–36.0)
MCV: 85.7 fL (ref 78.0–100.0)
RDW: 13.7 % (ref 11.5–15.5)

## 2011-09-02 LAB — SURGICAL PCR SCREEN: MRSA, PCR: NEGATIVE

## 2011-09-02 NOTE — Pre-Procedure Instructions (Signed)
20 [name]  09/02/2011   Your procedure is scheduled on: 09/03/11  Report to [facility] at 1330 AM.  Call this number if you have problems the morning of surgery: [phone]   Remember:   Do not eat food or drink:After Midnight.    Take these medicines the morning of surgery with A SIP OF WATER: diltiazem, nexium, reglan, pain med, phenergan  STOP any aspirin, nsaids, herbal meds, blood thinners   Do not wear jewelry, make-up or nail polish.  Do not wear lotions, powders, or perfumes. You may wear deodorant.  Do not shave 48 hours prior to surgery. Men may shave face and neck.  Do not bring valuables to the hospital.  Contacts, dentures or bridgework may not be worn into surgery.  Leave suitcase in the car. After surgery it may be brought to your room.  For patients admitted to the hospital, checkout time is 11:00 AM the day of discharge.   Patients discharged the day of surgery will not be allowed to drive home.  Name and phone number of your driver:[name] spouse [phone]   Special Instructions: CHG Shower Use Special Wash: 1/2 bottle night before surgery and 1/2 bottle morning of surgery.   Please read over the following fact sheets that you were given: Pain Booklet, Coughing and Deep Breathing, MRSA Information and Surgical Site Infection Prevention

## 2011-09-02 NOTE — Progress Notes (Signed)
Notes from dr Shirlee Latch 3/13, stress 2/13, holter monitor 3.13, ekg 2/13 epic

## 2011-09-03 ENCOUNTER — Ambulatory Visit (HOSPITAL_COMMUNITY)
Admission: RE | Admit: 2011-09-03 | Discharge: 2011-09-05 | Disposition: A | Discharge status: Home / Self Care | Payer: Medicare Other | Source: Ambulatory Visit | Attending: Neurosurgery | Admitting: Neurosurgery

## 2011-09-03 ENCOUNTER — Ambulatory Visit (HOSPITAL_COMMUNITY): Payer: Medicare Other

## 2011-09-03 ENCOUNTER — Encounter (HOSPITAL_COMMUNITY): Payer: Self-pay | Admitting: Anesthesiology

## 2011-09-03 ENCOUNTER — Encounter (HOSPITAL_COMMUNITY): Payer: Self-pay | Admitting: Surgery

## 2011-09-03 ENCOUNTER — Encounter (HOSPITAL_COMMUNITY)
Admission: RE | Disposition: A | Discharge status: Home / Self Care | Payer: Self-pay | Source: Ambulatory Visit | Attending: Neurosurgery

## 2011-09-03 ENCOUNTER — Ambulatory Visit (HOSPITAL_COMMUNITY): Payer: Medicare Other | Admitting: Anesthesiology

## 2011-09-03 DIAGNOSIS — IMO0001 Reserved for inherently not codable concepts without codable children: Secondary | ICD-10-CM | POA: Insufficient documentation

## 2011-09-03 DIAGNOSIS — Z01812 Encounter for preprocedural laboratory examination: Secondary | ICD-10-CM | POA: Insufficient documentation

## 2011-09-03 DIAGNOSIS — Z01818 Encounter for other preprocedural examination: Secondary | ICD-10-CM | POA: Insufficient documentation

## 2011-09-03 DIAGNOSIS — M069 Rheumatoid arthritis, unspecified: Secondary | ICD-10-CM | POA: Insufficient documentation

## 2011-09-03 DIAGNOSIS — K219 Gastro-esophageal reflux disease without esophagitis: Secondary | ICD-10-CM | POA: Insufficient documentation

## 2011-09-03 DIAGNOSIS — M48061 Spinal stenosis, lumbar region without neurogenic claudication: Secondary | ICD-10-CM | POA: Insufficient documentation

## 2011-09-03 DIAGNOSIS — E669 Obesity, unspecified: Secondary | ICD-10-CM | POA: Insufficient documentation

## 2011-09-03 DIAGNOSIS — R0602 Shortness of breath: Secondary | ICD-10-CM | POA: Insufficient documentation

## 2011-09-03 DIAGNOSIS — M4120 Other idiopathic scoliosis, site unspecified: Secondary | ICD-10-CM | POA: Insufficient documentation

## 2011-09-03 DIAGNOSIS — I10 Essential (primary) hypertension: Secondary | ICD-10-CM | POA: Insufficient documentation

## 2011-09-03 DIAGNOSIS — K449 Diaphragmatic hernia without obstruction or gangrene: Secondary | ICD-10-CM | POA: Insufficient documentation

## 2011-09-03 HISTORY — PX: LUMBAR LAMINECTOMY/DECOMPRESSION MICRODISCECTOMY: SHX5026

## 2011-09-03 SURGERY — LUMBAR LAMINECTOMY/DECOMPRESSION MICRODISCECTOMY 2 LEVELS
Anesthesia: General | Site: Spine Lumbar | Laterality: Left | Wound class: Clean

## 2011-09-03 MED ORDER — EPHEDRINE SULFATE 50 MG/ML IJ SOLN
INTRAMUSCULAR | Status: DC | PRN
  Administered 2011-09-03 (×2): 10 mg via INTRAVENOUS

## 2011-09-03 MED ORDER — PROPOFOL 10 MG/ML IV EMUL
INTRAVENOUS | Status: DC | PRN
  Administered 2011-09-03: 120 mg via INTRAVENOUS

## 2011-09-03 MED ORDER — LISINOPRIL 20 MG PO TABS
20.0000 mg | ORAL_TABLET | Freq: Every day | ORAL | Status: DC
  Administered 2011-09-03 – 2011-09-04 (×2): 20 mg via ORAL
  Filled 2011-09-03 (×3): qty 1

## 2011-09-03 MED ORDER — HEMOSTATIC AGENTS (NO CHARGE) OPTIME
TOPICAL | Status: DC | PRN
  Administered 2011-09-03: 1 via TOPICAL

## 2011-09-03 MED ORDER — ONDANSETRON HCL 4 MG/2ML IJ SOLN
INTRAMUSCULAR | Status: DC | PRN
  Administered 2011-09-03: 4 mg via INTRAVENOUS

## 2011-09-03 MED ORDER — HYDROMORPHONE HCL PF 1 MG/ML IJ SOLN: 0.5000 mg | INTRAMUSCULAR | Status: DC | PRN

## 2011-09-03 MED ORDER — SODIUM CHLORIDE 0.9 % IV SOLN: 250.0000 mL | INTRAVENOUS | Status: DC

## 2011-09-03 MED ORDER — HYDROMORPHONE HCL PF 1 MG/ML IJ SOLN
0.2500 mg | INTRAMUSCULAR | Status: DC | PRN
  Administered 2011-09-03: 0.5 mg via INTRAVENOUS

## 2011-09-03 MED ORDER — BACITRACIN 50000 UNITS IM SOLR
INTRAMUSCULAR | Status: DC | PRN
  Administered 2011-09-03: 12:00:00

## 2011-09-03 MED ORDER — CEFAZOLIN SODIUM 1-5 GM-% IV SOLN
1.0000 g | Freq: Three times a day (TID) | INTRAVENOUS | Status: AC
  Administered 2011-09-03 – 2011-09-04 (×2): 1 g via INTRAVENOUS
  Filled 2011-09-03 (×2): qty 50

## 2011-09-03 MED ORDER — ONDANSETRON HCL 4 MG/2ML IJ SOLN: 4.0000 mg | INTRAMUSCULAR | Status: DC | PRN

## 2011-09-03 MED ORDER — LIDOCAINE HCL (CARDIAC) 20 MG/ML IV SOLN
INTRAVENOUS | Status: DC | PRN
  Administered 2011-09-03: 100 mg via INTRAVENOUS

## 2011-09-03 MED ORDER — ALUM & MAG HYDROXIDE-SIMETH 200-200-20 MG/5ML PO SUSP: 30.0000 mL | Freq: Four times a day (QID) | ORAL | Status: DC | PRN

## 2011-09-03 MED ORDER — CEFAZOLIN SODIUM-DEXTROSE 2-3 GM-% IV SOLR
2.0000 g | INTRAVENOUS | Status: AC
  Administered 2011-09-03: 2 g via INTRAVENOUS

## 2011-09-03 MED ORDER — LIDOCAINE-EPINEPHRINE 1 %-1:100000 IJ SOLN
INTRAMUSCULAR | Status: DC | PRN
  Administered 2011-09-03: 10 mL

## 2011-09-03 MED ORDER — HYDROCHLOROTHIAZIDE 25 MG PO TABS
25.0000 mg | ORAL_TABLET | Freq: Every day | ORAL | Status: DC
  Administered 2011-09-03 – 2011-09-04 (×2): 25 mg via ORAL
  Filled 2011-09-03 (×3): qty 1

## 2011-09-03 MED ORDER — ASPIRIN EC 81 MG PO TBEC
81.0000 mg | DELAYED_RELEASE_TABLET | Freq: Every day | ORAL | Status: DC
  Administered 2011-09-04: 81 mg via ORAL
  Filled 2011-09-03 (×2): qty 1

## 2011-09-03 MED ORDER — ACETAMINOPHEN 325 MG PO TABS: 650.0000 mg | ORAL_TABLET | ORAL | Status: DC | PRN

## 2011-09-03 MED ORDER — SODIUM CHLORIDE 0.9 % IJ SOLN
3.0000 mL | Freq: Two times a day (BID) | INTRAMUSCULAR | Status: DC
  Administered 2011-09-03 – 2011-09-04 (×3): 3 mL via INTRAVENOUS

## 2011-09-03 MED ORDER — CEFAZOLIN SODIUM 1-5 GM-% IV SOLN
1.0000 g | INTRAVENOUS | Status: DC
  Filled 2011-09-03: qty 50

## 2011-09-03 MED ORDER — THROMBIN 5000 UNITS EX KIT
PACK | CUTANEOUS | Status: DC | PRN
  Administered 2011-09-03 (×2): 5000 [IU] via TOPICAL

## 2011-09-03 MED ORDER — HYDROMORPHONE HCL PF 1 MG/ML IJ SOLN
INTRAMUSCULAR | Status: AC
  Administered 2011-09-03: 0.5 mg via INTRAVENOUS
  Filled 2011-09-03: qty 1

## 2011-09-03 MED ORDER — SODIUM CHLORIDE 0.9 % IJ SOLN: 3.0000 mL | INTRAMUSCULAR | Status: DC | PRN

## 2011-09-03 MED ORDER — FENTANYL CITRATE 0.05 MG/ML IJ SOLN
INTRAMUSCULAR | Status: DC | PRN
  Administered 2011-09-03 (×2): 25 ug via INTRAVENOUS
  Administered 2011-09-03: 100 ug via INTRAVENOUS
  Administered 2011-09-03: 50 ug via INTRAVENOUS

## 2011-09-03 MED ORDER — ONDANSETRON HCL 4 MG/2ML IJ SOLN
INTRAMUSCULAR | Status: AC
  Administered 2011-09-03: 4 mg via INTRAVENOUS
  Filled 2011-09-03: qty 2

## 2011-09-03 MED ORDER — MENTHOL 3 MG MT LOZG: 1.0000 | LOZENGE | OROMUCOSAL | Status: DC | PRN

## 2011-09-03 MED ORDER — PROMETHAZINE HCL 25 MG RE SUPP: 25.0000 mg | Freq: Four times a day (QID) | RECTAL | Status: DC | PRN

## 2011-09-03 MED ORDER — OXYCODONE-ACETAMINOPHEN 5-325 MG PO TABS
2.0000 | ORAL_TABLET | ORAL | Status: DC | PRN
  Administered 2011-09-04 – 2011-09-05 (×3): 1 via ORAL
  Filled 2011-09-03 (×3): qty 1

## 2011-09-03 MED ORDER — SODIUM CHLORIDE 0.9 % IV SOLN
INTRAVENOUS | Status: AC
  Filled 2011-09-03: qty 500

## 2011-09-03 MED ORDER — CEFAZOLIN SODIUM-DEXTROSE 2-3 GM-% IV SOLR
INTRAVENOUS | Status: AC
  Filled 2011-09-03: qty 50

## 2011-09-03 MED ORDER — BUPIVACAINE HCL (PF) 0.25 % IJ SOLN
INTRAMUSCULAR | Status: DC | PRN
  Administered 2011-09-03: 10 mL

## 2011-09-03 MED ORDER — PHENOL 1.4 % MT LIQD: 1.0000 | OROMUCOSAL | Status: DC | PRN

## 2011-09-03 MED ORDER — MIDAZOLAM HCL 5 MG/5ML IJ SOLN
INTRAMUSCULAR | Status: DC | PRN
  Administered 2011-09-03: 2 mg via INTRAVENOUS

## 2011-09-03 MED ORDER — ROCURONIUM BROMIDE 100 MG/10ML IV SOLN
INTRAVENOUS | Status: DC | PRN
  Administered 2011-09-03: 50 mg via INTRAVENOUS

## 2011-09-03 MED ORDER — BACITRACIN 50000 UNITS IM SOLR
INTRAMUSCULAR | Status: AC
  Filled 2011-09-03: qty 1

## 2011-09-03 MED ORDER — NEOSTIGMINE METHYLSULFATE 1 MG/ML IJ SOLN
INTRAMUSCULAR | Status: DC | PRN
  Administered 2011-09-03: 5 mg via INTRAVENOUS

## 2011-09-03 MED ORDER — METOCLOPRAMIDE HCL 10 MG PO TABS
10.0000 mg | ORAL_TABLET | Freq: Two times a day (BID) | ORAL | Status: DC
  Administered 2011-09-04: 10 mg via ORAL
  Filled 2011-09-03 (×5): qty 1

## 2011-09-03 MED ORDER — PANTOPRAZOLE SODIUM 40 MG PO TBEC
40.0000 mg | DELAYED_RELEASE_TABLET | Freq: Every day | ORAL | Status: DC
  Administered 2011-09-04 – 2011-09-05 (×2): 40 mg via ORAL
  Filled 2011-09-03 (×2): qty 1

## 2011-09-03 MED ORDER — LISINOPRIL-HYDROCHLOROTHIAZIDE 20-25 MG PO TABS: 1.0000 | ORAL_TABLET | Freq: Every day | ORAL | Status: DC

## 2011-09-03 MED ORDER — ASPIRIN 81 MG PO TABS: 81.0000 mg | ORAL_TABLET | Freq: Once | ORAL | Status: DC

## 2011-09-03 MED ORDER — 0.9 % SODIUM CHLORIDE (POUR BTL) OPTIME
TOPICAL | Status: DC | PRN
  Administered 2011-09-03: 1000 mL

## 2011-09-03 MED ORDER — LACTATED RINGERS IV SOLN
INTRAVENOUS | Status: DC | PRN
  Administered 2011-09-03 (×2): via INTRAVENOUS

## 2011-09-03 MED ORDER — GLYCOPYRROLATE 0.2 MG/ML IJ SOLN
INTRAMUSCULAR | Status: DC | PRN
  Administered 2011-09-03: .8 mg via INTRAVENOUS

## 2011-09-03 MED ORDER — PROMETHAZINE HCL 25 MG PO TABS
25.0000 mg | ORAL_TABLET | Freq: Four times a day (QID) | ORAL | Status: DC | PRN
  Administered 2011-09-03: 25 mg via ORAL
  Filled 2011-09-03: qty 1

## 2011-09-03 MED ORDER — DEXAMETHASONE SODIUM PHOSPHATE 10 MG/ML IJ SOLN
10.0000 mg | INTRAMUSCULAR | Status: AC
  Administered 2011-09-03: 10 mg via INTRAVENOUS
  Filled 2011-09-03: qty 1

## 2011-09-03 MED ORDER — ACETAMINOPHEN 650 MG RE SUPP: 650.0000 mg | RECTAL | Status: DC | PRN

## 2011-09-03 MED ORDER — DILTIAZEM HCL ER 240 MG PO CP24
240.0000 mg | ORAL_CAPSULE | Freq: Every day | ORAL | Status: DC
  Administered 2011-09-04 – 2011-09-05 (×2): 240 mg via ORAL
  Filled 2011-09-03 (×2): qty 1

## 2011-09-03 MED ORDER — ONDANSETRON HCL 4 MG/2ML IJ SOLN
4.0000 mg | Freq: Once | INTRAMUSCULAR | Status: AC | PRN
  Administered 2011-09-03: 4 mg via INTRAVENOUS

## 2011-09-03 MED ORDER — ATORVASTATIN CALCIUM 20 MG PO TABS
20.0000 mg | ORAL_TABLET | Freq: Every day | ORAL | Status: DC
Start: 2011-09-03 — End: 2011-09-05
  Administered 2011-09-03 – 2011-09-04 (×2): 20 mg via ORAL
  Filled 2011-09-03 (×3): qty 1

## 2011-09-03 MED ORDER — CYCLOBENZAPRINE HCL 10 MG PO TABS: 10.0000 mg | ORAL_TABLET | Freq: Three times a day (TID) | ORAL | Status: DC | PRN

## 2011-09-03 SURGICAL SUPPLY — 59 items
ADH SKN CLS APL DERMABOND .7 (GAUZE/BANDAGES/DRESSINGS) ×1
APL SKNCLS STERI-STRIP NONHPOA (GAUZE/BANDAGES/DRESSINGS) ×1
BAG DECANTER FOR FLEXI CONT (MISCELLANEOUS) ×2 IMPLANT
BENZOIN TINCTURE PRP APPL 2/3 (GAUZE/BANDAGES/DRESSINGS) ×2 IMPLANT
BLADE SURG 11 STRL SS (BLADE) ×2 IMPLANT
BLADE SURG ROTATE 9660 (MISCELLANEOUS) IMPLANT
BRUSH SCRUB EZ PLAIN DRY (MISCELLANEOUS) ×2 IMPLANT
BUR MATCHSTICK NEURO 3.0 LAGG (BURR) ×2 IMPLANT
BUR PRECISION FLUTE 6.0 (BURR) ×2 IMPLANT
CANISTER SUCTION 2500CC (MISCELLANEOUS) ×2 IMPLANT
CLOTH BEACON ORANGE TIMEOUT ST (SAFETY) ×2 IMPLANT
CONT SPEC 4OZ CLIKSEAL STRL BL (MISCELLANEOUS) ×2 IMPLANT
DECANTER SPIKE VIAL GLASS SM (MISCELLANEOUS) ×2 IMPLANT
DERMABOND ADVANCED (GAUZE/BANDAGES/DRESSINGS) ×1
DERMABOND ADVANCED .7 DNX12 (GAUZE/BANDAGES/DRESSINGS) ×1 IMPLANT
DRAPE LAPAROTOMY 100X72X124 (DRAPES) ×2 IMPLANT
DRAPE MICROSCOPE LEICA (MISCELLANEOUS) ×1 IMPLANT
DRAPE MICROSCOPE ZEISS OPMI (DRAPES) ×1 IMPLANT
DRAPE POUCH INSTRU U-SHP 10X18 (DRAPES) ×2 IMPLANT
DRAPE PROXIMA HALF (DRAPES) IMPLANT
DRAPE SURG 17X23 STRL (DRAPES) ×2 IMPLANT
DRSG OPSITE 4X5.5 SM (GAUZE/BANDAGES/DRESSINGS) ×2 IMPLANT
ELECT REM PT RETURN 9FT ADLT (ELECTROSURGICAL) ×2
ELECTRODE REM PT RTRN 9FT ADLT (ELECTROSURGICAL) ×1 IMPLANT
EVACUATOR 3/16  PVC DRAIN (DRAIN) ×1
EVACUATOR 3/16 PVC DRAIN (DRAIN) IMPLANT
GAUZE SPONGE 4X4 16PLY XRAY LF (GAUZE/BANDAGES/DRESSINGS) IMPLANT
GLOVE BIO SURGEON STRL SZ8 (GLOVE) ×2 IMPLANT
GLOVE BIOGEL PI IND STRL 8.5 (GLOVE) IMPLANT
GLOVE BIOGEL PI INDICATOR 8.5 (GLOVE) ×1
GLOVE ECLIPSE 7.5 STRL STRAW (GLOVE) IMPLANT
GLOVE EXAM NITRILE LRG STRL (GLOVE) IMPLANT
GLOVE EXAM NITRILE MD LF STRL (GLOVE) ×1 IMPLANT
GLOVE EXAM NITRILE XL STR (GLOVE) IMPLANT
GLOVE EXAM NITRILE XS STR PU (GLOVE) IMPLANT
GLOVE INDICATOR 8.5 STRL (GLOVE) ×2 IMPLANT
GLOVE SURG SS PI 8.0 STRL IVOR (GLOVE) ×2 IMPLANT
GOWN BRE IMP SLV AUR LG STRL (GOWN DISPOSABLE) ×2 IMPLANT
GOWN BRE IMP SLV AUR XL STRL (GOWN DISPOSABLE) ×3 IMPLANT
GOWN STRL REIN 2XL LVL4 (GOWN DISPOSABLE) ×1 IMPLANT
KIT BASIN OR (CUSTOM PROCEDURE TRAY) ×2 IMPLANT
KIT ROOM TURNOVER OR (KITS) ×2 IMPLANT
NDL SPNL 22GX3.5 QUINCKE BK (NEEDLE) ×1 IMPLANT
NEEDLE HYPO 22GX1.5 SAFETY (NEEDLE) ×2 IMPLANT
NEEDLE SPNL 22GX3.5 QUINCKE BK (NEEDLE) ×2 IMPLANT
NS IRRIG 1000ML POUR BTL (IV SOLUTION) ×2 IMPLANT
PACK LAMINECTOMY NEURO (CUSTOM PROCEDURE TRAY) ×2 IMPLANT
RUBBERBAND STERILE (MISCELLANEOUS) ×4 IMPLANT
SPONGE GAUZE 4X4 12PLY (GAUZE/BANDAGES/DRESSINGS) ×2 IMPLANT
SPONGE SURGIFOAM ABS GEL SZ50 (HEMOSTASIS) ×2 IMPLANT
STRIP CLOSURE SKIN 1/2X4 (GAUZE/BANDAGES/DRESSINGS) ×2 IMPLANT
SUT VIC AB 0 CT1 18XCR BRD8 (SUTURE) ×1 IMPLANT
SUT VIC AB 0 CT1 8-18 (SUTURE) ×2
SUT VIC AB 2-0 CT1 18 (SUTURE) ×2 IMPLANT
SUT VICRYL 4-0 PS2 18IN ABS (SUTURE) ×2 IMPLANT
SYR 20ML ECCENTRIC (SYRINGE) ×2 IMPLANT
TOWEL OR 17X24 6PK STRL BLUE (TOWEL DISPOSABLE) ×2 IMPLANT
TOWEL OR 17X26 10 PK STRL BLUE (TOWEL DISPOSABLE) ×2 IMPLANT
WATER STERILE IRR 1000ML POUR (IV SOLUTION) ×2 IMPLANT

## 2011-09-03 NOTE — Anesthesia Postprocedure Evaluation (Signed)
  Anesthesia Post-op Note  Patient: [name]  Procedure(s) Performed: Procedure(s) (LRB): LUMBAR LAMINECTOMY/DECOMPRESSION MICRODISCECTOMY 2 LEVELS (Left)  Patient Location: PACU  Anesthesia Type: General  Level of Consciousness: awake, alert  and oriented  Airway and Oxygen Therapy: Patient Spontanous Breathing and Patient connected to nasal cannula oxygen  Post-op Pain: mild  Post-op Assessment: Post-op Vital signs reviewed  Post-op Vital Signs: Reviewed  Complications: No apparent anesthesia complications

## 2011-09-03 NOTE — H&P (Signed)
Pt request SCD's during surgery and to use "baby" needle when starting IV.  Note put on physical chart.//L. Ameia Morency,RN

## 2011-09-03 NOTE — Preoperative (Signed)
Beta Blockers   Reason not to administer Beta Blockers:Not Applicable 

## 2011-09-03 NOTE — Plan of Care (Signed)
Problem: Consults Goal: Diagnosis - Spinal Surgery Outcome: Completed/Met Date Met:  09/03/11 Lumbar Laminectomy (Complex)

## 2011-09-03 NOTE — Transfer of Care (Signed)
Immediate Anesthesia Transfer of Care Note  Patient: [name]  Procedure(s) Performed: Procedure(s) (LRB): LUMBAR LAMINECTOMY/DECOMPRESSION MICRODISCECTOMY 2 LEVELS (Left)  Patient Location: PACU  Anesthesia Type: General  Level of Consciousness: awake, alert  and oriented  Airway & Oxygen Therapy: Patient Spontanous Breathing and Patient connected to nasal cannula oxygen  Post-op Assessment: Report given to PACU RN and Post -op Vital signs reviewed and stable  Post vital signs: Reviewed and stable  Complications: No apparent anesthesia complications

## 2011-09-03 NOTE — H&P (Signed)
[name] is an 71 y.o. female.   Chief Complaint: Back and left leg pain HPI: Patient is a very pleasant 71 or for him is a progress worsening from a left-sided low back and left hip buttock and leg pain rate in the outside of her left thigh into the front of her shin adjacent of her foot and big toe consistent with an L4 and L5 nerve root pattern. Patient been refractory to all forms of conservative with anti-inflammatories physical therapy and epidural steroid injections her imaging showed severe stenosis foraminally at L3-4 and L4-5 with compression of both the L3-L4 and L5 nerve root also shows significant degenerative on top of idiopathic scoliosis at multiple levels due to patient's predominance of hip and leg pain I recommended a decompression procedure only at L3-4 and L4-5 height is reviewed the risks and benefits of the operation as well as  Course expectations about alternatives of surgery she understands and agrees to proceed forward. I also went over extensively the options with regard to her scoliosis of this but that potentially progressing and requiring multilevel fusion. We'll agree that the first course of action should be attempted decompression limited to give her adequate pain relief as opposed to subjecting her initially to a thoraco- lumbar stabilization procedure.  Past Medical History  Diagnosis Date  . Headache   . Obesity, unspecified   . Stricture and stenosis of esophagus   . Diverticulosis of colon (without mention of hemorrhage)   . Personal history of colonic polyps 05/31/2007    adenomatous polyp  . Esophageal reflux   . Unspecified gastritis and gastroduodenitis without mention of hemorrhage   . Hiatal hernia   . Rheumatoid arthritis   . Scoliosis   . Hyperlipidemia   . Hypertension   . Osteoporosis   . Family history of malignant neoplasm of gastrointestinal tract   . Dysrhythmia     notes in epic  . Pneumonia     hx  . Fibromyalgia     Past  Surgical History  Procedure Date  . Sigmoid resection / rectopexy 09/1999    Dr Earlene Plater  . Thumb surgery     trigger finger lft  . Salivary gland surgery     1 removed  . Shoulder surgery     right shoulder and collar bone  . Colonoscopy   . Upper gastrointestinal endoscopy   . Breast surgery     cyst lanced and drainded  . Tumor removal     left leg  . Hernia repair     incisional    Family History  Problem Relation Age of Onset  . Pancreatic cancer Sister   . Stroke Mother   . Hypertension Mother   . Stroke Father   . Hypertension Father   . Diabetes Father   . Diabetes Sister     x 2   Social History:  reports that she has never smoked. She has never used smokeless tobacco. She reports that she does not drink alcohol or use illicit drugs.  Allergies: No Known Allergies  Medications Prior to Admission  Medication Sig Dispense Refill  . aspirin 81 MG tablet Take 81 mg by mouth daily as needed.      [name] atorvastatin (LIPITOR) 20 MG tablet Take 20 mg by mouth daily.        [name] diltiazem (DILACOR XR) 240 MG 24 hr capsule Take 240 mg by mouth daily.        [name] esomeprazole (NEXIUM) 40  MG capsule Take 1 capsule (40 mg total) by mouth 2 (two) times daily.  180 capsule  3  . lisinopril-hydrochlorothiazide (PRINZIDE,ZESTORETIC) 20-25 MG per tablet Take 1 tablet by mouth daily.        [name] oxyCODONE-acetaminophen (TYLOX) 5-500 MG per capsule Take 1 capsule by mouth every 4 (four) hours as needed.        . psyllium (METAMUCIL) 58.6 % packet Take 1 packet by mouth daily as needed.      . Cyanocobalamin (VITAMIN B-12 IJ) Inject as directed every 30 (thirty) days.      . metoCLOPramide (REGLAN) 10 MG tablet Take 10 mg by mouth 2 (two) times daily.        . promethazine (PHENERGAN) 25 MG suppository Place 25 mg rectally every 6 (six) hours as needed.        . promethazine (PHENERGAN) 25 MG tablet Take 25 mg by mouth 3 (three) times daily as needed.          Results for orders placed during  the hospital encounter of 09/02/11 (from the past 48 hour(s))  SURGICAL PCR SCREEN     Status: Normal   Collection Time   09/02/11  9:30 AM      Component Value Range Comment   MRSA, PCR NEGATIVE  NEGATIVE    Staphylococcus aureus NEGATIVE  NEGATIVE   BASIC METABOLIC PANEL     Status: Abnormal   Collection Time   09/02/11  9:37 AM      Component Value Range Comment   Sodium 134 (*) 135 - 145 mEq/L    Potassium 3.5  3.5 - 5.1 mEq/L    Chloride 97  96 - 112 mEq/L    CO2 22  19 - 32 mEq/L    Glucose, Bld 109 (*) 70 - 99 mg/dL    BUN 15  6 - 23 mg/dL    Creatinine, Ser 2.13  0.50 - 1.10 mg/dL    Calcium 9.3  8.4 - 08.6 mg/dL    GFR calc non Af Amer 67 (*) >90 mL/min    GFR calc Af Amer 78 (*) >90 mL/min   CBC     Status: Normal   Collection Time   09/02/11  9:37 AM      Component Value Range Comment   WBC 6.4  4.0 - 10.5 K/uL    RBC 5.03  3.87 - 5.11 MIL/uL    Hemoglobin 14.3  12.0 - 15.0 g/dL    HCT 57.8  46.9 - 62.9 %    MCV 85.7  78.0 - 100.0 fL    MCH 28.4  26.0 - 34.0 pg    MCHC 33.2  30.0 - 36.0 g/dL    RDW 52.8  41.3 - 24.4 %    Platelets 248  150 - 400 K/uL    Dg Chest 2 View  09/02/2011  *RADIOLOGY REPORT*  Clinical Data: Preop for lumbar laminectomy.  Hiatal hernia. Hypertension.  CHEST - 2 VIEW  Comparison: 06/05/2007  Findings: Lateral view degraded by patient arm position.  Moderate S-shaped thoracolumbar spine curvature. Midline trachea. Borderline cardiomegaly.     Mediastinal contours otherwise within normal limits.  No pleural effusion or pneumothorax.  Clear lungs.  IMPRESSION: No acute cardiopulmonary disease.  Original Report Authenticated By: Consuello Bossier, M.D.    Review of Systems  Constitutional: Negative.   HENT: Negative.   Eyes: Negative.   Respiratory: Negative.   Cardiovascular: Negative.   Gastrointestinal: Negative.   Genitourinary: Negative.  Musculoskeletal: Positive for back pain.  Skin: Negative.   Neurological: Positive for sensory  change.  Endo/Heme/Allergies: Negative.   Psychiatric/Behavioral: Negative.     Blood pressure 133/80, pulse 77, temperature 98 F (36.7 C), temperature source Oral, resp. rate 19, SpO2 97.00%. Physical Exam  Constitutional: She is oriented to person, place, and time. She appears well-developed and well-nourished.  Eyes: Pupils are equal, round, and reactive to light.  Neck: Normal range of motion.  Respiratory: Effort normal.  GI: Soft.  Neurological: She is alert and oriented to person, place, and time. She has normal strength. GCS eye subscore is 4. GCS verbal subscore is 5. GCS motor subscore is 6.  Reflex Scores:      Tricep reflexes are 1+ on the right side and 1+ on the left side.      Bicep reflexes are 1+ on the right side and 1+ on the left side.      Brachioradialis reflexes are 1+ on the right side and 1+ on the left side.      Patellar reflexes are 0 on the right side and 0 on the left side.      Achilles reflexes are 0 on the right side and 0 on the left side.      Strength is 5 out of 5 in her iliopsoas, quads, hamstrings, gastrocs, anterior tibialis, and EHL. Sensation is grossly intact to light touch     Assessment/Plan 71 or female presents for an L3-4 and L4-5 decompressive laminectomy on the left  Claudean Leavelle P 09/03/2011, 10:29 AM

## 2011-09-03 NOTE — Anesthesia Preprocedure Evaluation (Addendum)
Anesthesia Evaluation  Patient identified by MRN, date of birth, ID band Patient awake    Reviewed: Allergy & Precautions, H&P , NPO status , Patient's Chart, lab work & pertinent test results  Airway Mallampati: I TM Distance: >3 FB Neck ROM: Full    Dental  (+) Edentulous Upper and Dental Advisory Given   Pulmonary shortness of breath and with exertion, pneumonia ,  breath sounds clear to auscultation        Cardiovascular hypertension, Pt. on medications Rhythm:Regular Rate:Normal     Neuro/Psych  Headaches,  Neuromuscular disease    GI/Hepatic hiatal hernia, GERD-  Medicated and Controlled,  Endo/Other    Renal/GU      Musculoskeletal  (+) Arthritis -, Fibromyalgia -  Abdominal   Peds  Hematology   Anesthesia Other Findings   Reproductive/Obstetrics                          Anesthesia Physical Anesthesia Plan  ASA: III  Anesthesia Plan: General   Post-op Pain Management:    Induction: Intravenous  Airway Management Planned: Oral ETT  Additional Equipment:   Intra-op Plan:   Post-operative Plan: Extubation in OR  Informed Consent: I have reviewed the patients History and Physical, chart, labs and discussed the procedure including the risks, benefits and alternatives for the proposed anesthesia with the patient or authorized representative who has indicated his/her understanding and acceptance.   Dental advisory given  Plan Discussed with: Anesthesiologist and Surgeon  Anesthesia Plan Comments:         Anesthesia Quick Evaluation

## 2011-09-03 NOTE — Op Note (Signed)
Preoperative diagnosis: Lumbar spinal stenosis L3-4 L4-5 left with severe foraminal stenosis of the L3, L4, and L5 nerve root with left-sided L3-L4 and L5 radiculopathies  Postoperative diagnosis: Same  Procedure: Decompressive lumbar laminectomy on the left at L3-4 and decompressive lumbar laminectomy on the left at L4-5 with decompression of the L3, L4 and L5 nerve roots with microdissection and micro-foraminotomies of the L3, L4, and L5 nerve roots.  Surgeon: Jillyn Hidden Lennon Boutwell  Anesthesia: Gen.  EBL: Minimal  History of present illness: Patient is a 18 or female is a progress worsening back and left hip and leg pain rating down L3-L4 and L5 nerve root pattern patient failed all forms of conservative treatment imaging findings showed severe degenerative on top of idiopathic scoliosis with severe stenosis at L3-4 and L4-5 with severe foraminal stenosis of the L3-L4 and L5 nerve roots. Due to her progression of clinical syndrome and imaging findings of a conservative measures recommended decompression L3-4 and L4-5 with microdissection foraminotomies of the L3-4 and V nerve roots. I extensively the risks benefits of the operation as well as perioperative course expectations about alternatives of surgery and she understood and agreed to proceed forward.  Operative procedure: Patient was brought into the or was induced under general anesthesia positioned prone the Wilson frame her back was prepped and draped in routine sterile fashion. Preoperative x-ray localize the appropriate levels after infiltration 10 cc lidocaine with epi a midline incision was made and Bovie left car was used to take a subcutaneous tissues and subperiosteal dissection care lamina 34 and 5 on the left. Interoperative X. identify the appropriate level so the interest of the 3 medial facet complex aggressive L4 and the interest of L4 medial facet was medial facet complexes suppressant fibers of the about a high-speed drill with the  overgrown in touch in the lateral margin spinous process so the only way into the canal was initiated initially 3 L4-5 laminectomy and complete laminectomy of L4 had been performed with severe stenosis at valley at the L3-4 level to the medial facet arthropathy and a marked overgrowth of the lamina and spinal laminar complex at L3-4 to results she is off of the dura with a 4 Penfield and removed in piecemeal fashion with 20 minute Kerrison punch March of the thigh removed almost 3/4 of the L3 lamina to alleviate the stenosis I palpated cephalad and then taking care to take as much of the facet complex at L3-4 L4-5 as I could I drilled down the we'll medial margin of facet and under bit there is facet identify and decompress the L3-L4 and L5 nerve roots. At this point the operating microscope was draped and brought into the field and the microscope illumination the L3 nerve root and pedicle were identified this foramen was unroofed then as marked facet arthropathy and a large spur coming off the interest of the medial facet complex to severe compression at the L4 nerve root at the level the L3-4 disc space is is also teased off the dura with a 4 Penfield movement piecemeal fashion with 2 minute Kerrison punch. Decompress 4-4 foramen as well and marching down inferiorly the L5 nerve was also at the decompression there is no further stenosis on the left of the L3-L4 or L5 nerve root who was copiously irrigated meticulous hemostasis was maintained Gelfoam was overlaid top of the dura large director was placed and the wounds closed in layers with after Vicryl and skin was closed running 4 septic or benzoin and Steri-Strips  were applied patient recovered in stable condition at the end of case on it counts and sponge counts were correct.

## 2011-09-04 MED ORDER — CYCLOBENZAPRINE HCL 10 MG PO TABS: 10.0000 mg | ORAL_TABLET | Freq: Three times a day (TID) | ORAL | Status: AC | PRN

## 2011-09-04 MED ORDER — OXYCODONE-ACETAMINOPHEN 5-325 MG PO TABS: 2.0000 | ORAL_TABLET | ORAL | Status: AC | PRN

## 2011-09-04 NOTE — Progress Notes (Signed)
Subjective: Patient reports Her leg pain is much better to have a lot of incisional pain  Objective: Vital signs in last 24 hours: Temp:  [97.2 F (36.2 C)-98.6 F (37 C)] 98.6 F (37 C) (06/27 0400) Pulse Rate:  [67-113] 91  (06/27 0400) Resp:  [12-19] 16  (06/27 0400) BP: (111-178)/(53-80) 117/59 mmHg (06/27 0400) SpO2:  [93 %-100 %] 93 % (06/27 0400)  Intake/Output from previous day: 06/26 0701 - 06/27 0700 In: 1780 [P.O.:480; I.V.:1200; IV Piggyback:100] Out: 220 [Drains:90; Blood:130] Intake/Output this shift:    Strength is 5 of 5 wound clean and dry  Lab Results:  [facility] 09/02/11 0937  WBC 6.4  HGB 14.3  HCT 43.1  PLT 248   BMET  Basename 09/02/11 0937  NA 134*  K 3.5  CL 97  CO2 22  GLUCOSE 109*  BUN 15  CREATININE 0.86  CALCIUM 9.3    Studies/Results: Dg Chest 2 View  09/02/2011  *RADIOLOGY REPORT*  Clinical Data: Preop for lumbar laminectomy.  Hiatal hernia. Hypertension.  CHEST - 2 VIEW  Comparison: 06/05/2007  Findings: Lateral view degraded by patient arm position.  Moderate S-shaped thoracolumbar spine curvature. Midline trachea. Borderline cardiomegaly.     Mediastinal contours otherwise within normal limits.  No pleural effusion or pneumothorax.  Clear lungs.  IMPRESSION: No acute cardiopulmonary disease.  Original Report Authenticated By: Consuello Bossier, M.D.   Dg Lumbar Spine 2-3 Views  09/03/2011  *RADIOLOGY REPORT*  Clinical Data: Back pain  LUMBAR SPINE - 2-3 VIEW  Comparison: 08/08/2011  Findings: Film #1 demonstrates a needle from a posterior approach directed most closely toward the L3 vertebral body.  Film #2 demonstrates an angled probe from posterior approach directed most closely toward the L2-3 interspace.  IMPRESSION: As above.  Original Report Authenticated By: Elsie Stain, M.D.    Assessment/Plan: Continue mobilizes patient today we'll discharge later today continue her Hemovac for now  LOS: 1 day     [name]  P 09/04/2011, 7:37 AM

## 2011-09-04 NOTE — Discharge Summary (Signed)
  Physician Discharge Summary  Patient ID: [name] MRN: [id number] DOB/AGE: [id number] 70 y.o.  Admit date: 09/03/2011 Discharge date: 09/04/2011  Admission Diagnoses: Lumbar spinal stenosis and degenerative scoliosis L3-4 L4-5  Discharge Diagnoses: Same Active Problems:  * No active hospital problems. *    Discharged Condition: good  Hospital Course: Patient was admitted hospital underwent a decompressive lumbar lengthening on the left at L3-4 L4-5 postoperatively patient did very well with recovered in the floor on the floor she was ambulating well voiding spontaneously tolerating regular diet ultimately the pain became under better control issues he'll be discharged home.  Consults: Significant Diagnostic Studies: Treatments: Decompressive lumbar limiting L3-4 and L4-5 left Discharge Exam: Blood pressure 117/59, pulse 91, temperature 98.6 F (37 C), temperature source Oral, resp. rate 16, SpO2 93.00%. Thank out of 5 wound clean and dry  Disposition: Home   Medication List  As of 09/04/2011  7:40 AM   TAKE these medications         aspirin 81 MG tablet   Take 81 mg by mouth daily as needed.      atorvastatin 20 MG tablet   Commonly known as: LIPITOR   Take 20 mg by mouth daily.      cyclobenzaprine 10 MG tablet   Commonly known as: FLEXERIL   Take 1 tablet (10 mg total) by mouth 3 (three) times daily as needed for muscle spasms.      diltiazem 240 MG 24 hr capsule   Commonly known as: DILACOR XR   Take 240 mg by mouth daily.      esomeprazole 40 MG capsule   Commonly known as: NEXIUM   Take 1 capsule (40 mg total) by mouth 2 (two) times daily.      lisinopril-hydrochlorothiazide 20-25 MG per tablet   Commonly known as: PRINZIDE,ZESTORETIC   Take 1 tablet by mouth daily.      metoCLOPramide 10 MG tablet   Commonly known as: REGLAN   Take 10 mg by mouth 2 (two) times daily.      oxyCODONE-acetaminophen 5-500 MG per capsule   Commonly known as: TYLOX   Take 1 capsule by mouth every 4 (four) hours as needed.      oxyCODONE-acetaminophen 5-325 MG per tablet   Commonly known as: PERCOCET   Take 2 tablets by mouth every 4 (four) hours as needed.      promethazine 25 MG suppository   Commonly known as: PHENERGAN   Place 25 mg rectally every 6 (six) hours as needed.      promethazine 25 MG tablet   Commonly known as: PHENERGAN   Take 25 mg by mouth 3 (three) times daily as needed.      psyllium 58.6 % packet   Commonly known as: METAMUCIL   Take 1 packet by mouth daily as needed.      VITAMIN B-12 IJ   Inject as directed every 30 (thirty) days.             Signed: Kareen Hitsman P 09/04/2011, 7:40 AM

## 2011-09-05 ENCOUNTER — Encounter (HOSPITAL_COMMUNITY): Payer: Self-pay | Admitting: Neurosurgery

## 2011-09-05 NOTE — Progress Notes (Signed)
Subjective: Patient reports Overall she's doing much better she still having no leg pain very little back pain she feels overall about at this morning she felt great last night she became bloody emesis morning and she voiding spontaneously  Objective: Vital signs in last 24 hours: Temp:  [97.9 F (36.6 C)-99 F (37.2 C)] 97.9 F (36.6 C) (06/28 0400) Pulse Rate:  [72-96] 72  (06/28 0400) Resp:  [16-18] 18  (06/28 0400) BP: (101-137)/(43-77) 101/43 mmHg (06/28 0400) SpO2:  [92 %-97 %] 96 % (06/28 0400)  Intake/Output from previous day: 06/27 0701 - 06/28 0700 In: 840 [P.O.:840] Out: 55 [Drains:55] Intake/Output this shift:    Strength out of 5 wound clean and dry I took out her Hemovac  Lab Results:  [facility] 09/02/11 0937  WBC 6.4  HGB 14.3  HCT 43.1  PLT 248   BMET  Basename 09/02/11 0937  NA 134*  K 3.5  CL 97  CO2 22  GLUCOSE 109*  BUN 15  CREATININE 0.86  CALCIUM 9.3    Studies/Results: Dg Lumbar Spine 2-3 Views  09/03/2011  *RADIOLOGY REPORT*  Clinical Data: Back pain  LUMBAR SPINE - 2-3 VIEW  Comparison: 08/08/2011  Findings: Film #1 demonstrates a needle from a posterior approach directed most closely toward the L3 vertebral body.  Film #2 demonstrates an angled probe from posterior approach directed most closely toward the L2-3 interspace.  IMPRESSION: As above.  Original Report Authenticated By: Elsie Stain, M.D.    Assessment/Plan: Discharge later this morning  LOS: 2 days     Siddhartha Hoback P 09/05/2011, 8:10 AM

## 2011-09-05 NOTE — Discharge Instructions (Signed)
Wound Care Keep incision covered and dry for 2 days.   You may remove outer bandage after 2 days and shower.  Do not put any creams, lotions, or ointments on incision. Leave steri-strips on back.  They will fall off by themselves. Activity Walk each and every day, increasing distance each day. No lifting greater than 5 lbs.  Avoid bending, arching, or twisting. No lifting no bending or twisting no driving or riding a car,unless coming to see me . Keep the incision clean and dry If provided with back brace, wear when out of bed.  It is not necessary to wear in bed. Diet Resume your normal diet.  Return to Work Will be discussed at you follow up appointment. Call Your Doctor If Any of These Occur Redness, drainage, or swelling at the wound.  Temperature greater than 101 degrees. Severe pain not relieved by pain medication. Incision starts to come apart. Follow Up Appt Call today for appointment in 1-2 weeks ([phone]) or for problems.  If you have any hardware placed in your spine, you will need an x-ray before your appointment.

## 2011-09-08 DIAGNOSIS — I82409 Acute embolism and thrombosis of unspecified deep veins of unspecified lower extremity: Secondary | ICD-10-CM

## 2011-09-08 HISTORY — PX: HEMATOMA EVACUATION: SHX5118

## 2011-09-08 HISTORY — DX: Acute embolism and thrombosis of unspecified deep veins of unspecified lower extremity: I82.409

## 2011-09-09 ENCOUNTER — Ambulatory Visit (HOSPITAL_COMMUNITY)
Admission: RE | Admit: 2011-09-09 | Discharge: 2011-09-09 | Disposition: A | Discharge status: Home / Self Care | Payer: Medicare Other | Source: Ambulatory Visit | Attending: Neurosurgery | Admitting: Neurosurgery

## 2011-09-09 ENCOUNTER — Emergency Department (HOSPITAL_COMMUNITY)
Admission: EM | Admit: 2011-09-09 | Discharge: 2011-09-09 | Disposition: A | Discharge status: Home / Self Care | Payer: Medicare Other | Attending: Emergency Medicine | Admitting: Emergency Medicine

## 2011-09-09 ENCOUNTER — Encounter (HOSPITAL_COMMUNITY): Payer: Self-pay | Admitting: Neurology

## 2011-09-09 DIAGNOSIS — M79609 Pain in unspecified limb: Secondary | ICD-10-CM

## 2011-09-09 DIAGNOSIS — M545 Low back pain, unspecified: Secondary | ICD-10-CM | POA: Insufficient documentation

## 2011-09-09 DIAGNOSIS — I82819 Embolism and thrombosis of superficial veins of unspecified lower extremities: Secondary | ICD-10-CM | POA: Insufficient documentation

## 2011-09-09 DIAGNOSIS — R609 Edema, unspecified: Secondary | ICD-10-CM | POA: Insufficient documentation

## 2011-09-09 DIAGNOSIS — M7989 Other specified soft tissue disorders: Secondary | ICD-10-CM

## 2011-09-09 DIAGNOSIS — I10 Essential (primary) hypertension: Secondary | ICD-10-CM | POA: Insufficient documentation

## 2011-09-09 DIAGNOSIS — I82409 Acute embolism and thrombosis of unspecified deep veins of unspecified lower extremity: Secondary | ICD-10-CM

## 2011-09-09 DIAGNOSIS — M79606 Pain in leg, unspecified: Secondary | ICD-10-CM

## 2011-09-09 DIAGNOSIS — Z9889 Other specified postprocedural states: Secondary | ICD-10-CM | POA: Insufficient documentation

## 2011-09-09 DIAGNOSIS — I824Z9 Acute embolism and thrombosis of unspecified deep veins of unspecified distal lower extremity: Secondary | ICD-10-CM | POA: Insufficient documentation

## 2011-09-09 LAB — BASIC METABOLIC PANEL WITH GFR
BUN: 18 mg/dL (ref 6–23)
CO2: 27 meq/L (ref 19–32)
Calcium: 9.2 mg/dL (ref 8.4–10.5)
Chloride: 94 meq/L — ABNORMAL LOW (ref 96–112)
Creatinine, Ser: 1 mg/dL (ref 0.50–1.10)
GFR calc Af Amer: 65 mL/min — ABNORMAL LOW
GFR calc non Af Amer: 56 mL/min — ABNORMAL LOW
Glucose, Bld: 116 mg/dL — ABNORMAL HIGH (ref 70–99)
Potassium: 3.7 meq/L (ref 3.5–5.1)
Sodium: 131 meq/L — ABNORMAL LOW (ref 135–145)

## 2011-09-09 LAB — CBC
Hemoglobin: 13.2 g/dL (ref 12.0–15.0)
MCH: 28.5 pg (ref 26.0–34.0)
MCHC: 34.3 g/dL (ref 30.0–36.0)
Platelets: 235 10*3/uL (ref 150–400)

## 2011-09-09 MED ORDER — ENOXAPARIN SODIUM 80 MG/0.8ML ~~LOC~~ SOLN: 80.0000 mg | Freq: Two times a day (BID) | SUBCUTANEOUS | Status: DC

## 2011-09-09 MED ORDER — ENOXAPARIN (LOVENOX) PATIENT EDUCATION KIT
PACK | Freq: Once | Status: AC
  Administered 2011-09-09: 20:00:00
  Filled 2011-09-09: qty 1

## 2011-09-09 MED ORDER — OXYCODONE-ACETAMINOPHEN 5-325 MG PO TABS
1.0000 | ORAL_TABLET | Freq: Once | ORAL | Status: AC
  Administered 2011-09-09: 1 via ORAL
  Filled 2011-09-09: qty 1

## 2011-09-09 MED ORDER — ENOXAPARIN SODIUM 100 MG/ML ~~LOC~~ SOLN
90.0000 mg | SUBCUTANEOUS | Status: AC
  Administered 2011-09-09: 90 mg via SUBCUTANEOUS
  Filled 2011-09-09: qty 1

## 2011-09-09 NOTE — ED Provider Notes (Signed)
History     CSN: [id number]  Arrival date & time 09/09/11  1637   None     Chief Complaint  Patient presents with  . Leg Pain    seen in vascular lab, found to have DVT in left lower leg    (Consider location/radiation/quality/duration/timing/severity/associated sxs/prior treatment) Patient is a 71 y.o. female presenting with leg pain. The history is provided by the patient and medical records.  Leg Pain  The incident occurred 2 days ago. The incident occurred at home. There was no injury mechanism. The pain is present in the left leg. The quality of the pain is described as sharp. The pain is severe. The pain has been constant since onset. Pertinent negatives include no numbness, no inability to bear weight, no loss of motion, no muscle weakness, no loss of sensation and no tingling. She reports no foreign bodies present. The symptoms are aggravated by palpation. She has tried nothing for the symptoms.    Past Medical History  Diagnosis Date  . Headache   . Obesity, unspecified   . Stricture and stenosis of esophagus   . Diverticulosis of colon (without mention of hemorrhage)   . Personal history of colonic polyps 05/31/2007    adenomatous polyp  . Esophageal reflux   . Unspecified gastritis and gastroduodenitis without mention of hemorrhage   . Hiatal hernia   . Rheumatoid arthritis   . Scoliosis   . Hyperlipidemia   . Hypertension   . Osteoporosis   . Family history of malignant neoplasm of gastrointestinal tract   . Dysrhythmia     notes in epic  . Pneumonia     hx  . Fibromyalgia     Past Surgical History  Procedure Date  . Sigmoid resection / rectopexy 09/1999    Dr Earlene Plater  . Thumb surgery     trigger finger lft  . Salivary gland surgery     1 removed  . Shoulder surgery     right shoulder and collar bone  . Colonoscopy   . Upper gastrointestinal endoscopy   . Breast surgery     cyst lanced and drainded  . Tumor removal     left leg  . Hernia repair    incisional  . Lumbar laminectomy/decompression microdiscectomy 09/03/2011    Procedure: LUMBAR LAMINECTOMY/DECOMPRESSION MICRODISCECTOMY 2 LEVELS;  Surgeon: Mariam Dollar, MD;  Location: [facility];  Service: Neurosurgery;  Laterality: Left;  Left Lumbar Three-Four, Lumbar Four-Five Decompressive Laminectomy    Family History  Problem Relation Age of Onset  . Pancreatic cancer Sister   . Stroke Mother   . Hypertension Mother   . Stroke Father   . Hypertension Father   . Diabetes Father   . Diabetes Sister     x 2    History  Substance Use Topics  . Smoking status: Never Smoker   . Smokeless tobacco: Never Used  . Alcohol Use: No    OB History    Grav Para Term Preterm Abortions TAB SAB Ect Mult Living                  Review of Systems  Constitutional: Negative for fever and chills.  HENT: Negative for congestion and rhinorrhea.   Respiratory: Negative for cough, chest tightness and shortness of breath.   Cardiovascular: Positive for leg swelling. Negative for chest pain.  Gastrointestinal: Negative for nausea, vomiting and abdominal pain.  Musculoskeletal: Positive for back pain (lower back, at site of surgery).  Skin: Negative for color change, rash and wound.  Neurological: Negative for tingling, light-headedness, numbness and headaches.  All other systems reviewed and are negative.    Allergies  Review of patient's allergies indicates no known allergies.  Home Medications   Current Outpatient Rx  Name Route Sig Dispense Refill  . ASPIRIN 81 MG PO TABS Oral Take 81 mg by mouth daily as needed.    . ATORVASTATIN CALCIUM 20 MG PO TABS Oral Take 20 mg by mouth daily.      [name] VITAMIN B-12 IJ Injection Inject as directed every 30 (thirty) days.    . CYCLOBENZAPRINE HCL 10 MG PO TABS Oral Take 1 tablet (10 mg total) by mouth 3 (three) times daily as needed for muscle spasms. 80 tablet 1  . DILTIAZEM HCL 240 MG PO CP24 Oral Take 240 mg by mouth daily.      [name]  ESOMEPRAZOLE MAGNESIUM 40 MG PO CPDR Oral Take 1 capsule (40 mg total) by mouth 2 (two) times daily. 180 capsule 3  . LISINOPRIL-HYDROCHLOROTHIAZIDE 20-25 MG PO TABS Oral Take 1 tablet by mouth daily.      [name] METOCLOPRAMIDE HCL 10 MG PO TABS Oral Take 10 mg by mouth 2 (two) times daily.      . OXYCODONE-ACETAMINOPHEN 5-325 MG PO TABS Oral Take 2 tablets by mouth every 4 (four) hours as needed. 80 tablet 0  . OXYCODONE-ACETAMINOPHEN 5-500 MG PO CAPS Oral Take 1 capsule by mouth every 4 (four) hours as needed.      [name] PROMETHAZINE HCL 25 MG RE SUPP Rectal Place 25 mg rectally every 6 (six) hours as needed.      [name] PROMETHAZINE HCL 25 MG PO TABS Oral Take 25 mg by mouth 3 (three) times daily as needed.      . PSYLLIUM 58.6 % PO PACK Oral Take 1 packet by mouth daily as needed.      BP 136/64  Pulse 109  Temp 98.9 F (37.2 C) (Oral)  Resp 20  Physical Exam  Nursing note and vitals reviewed. Constitutional: She is oriented to person, place, and time. She appears well-developed and well-nourished.  HENT:  Head: Normocephalic and atraumatic.  Eyes: Pupils are equal, round, and reactive to light.  Cardiovascular: Normal rate, regular rhythm, normal heart sounds and intact distal pulses.   Pulmonary/Chest: Effort normal and breath sounds normal. No respiratory distress.  Abdominal: Soft. She exhibits no distension. There is no tenderness.  Musculoskeletal:       Arms:      Left lower leg: She exhibits tenderness and edema.       Legs: Neurological: She is alert and oriented to person, place, and time.  Skin: Skin is warm and dry.  Psychiatric: She has a normal mood and affect.    ED Course  Procedures (including critical care time)  Labs Reviewed  CBC - Abnormal; Notable for the following:    WBC 13.0 (*)     All other components within normal limits  BASIC METABOLIC PANEL - Abnormal; Notable for the following:    Sodium 131 (*)     Chloride 94 (*)     Glucose, Bld 116 (*)     GFR  calc non Af Amer 56 (*)     GFR calc Af Amer 65 (*)     All other components within normal limits   No results found.   1. DVT (deep venous thrombosis)       MDM  71 yo  F presents today with a DVT. Had lumbar laminectomy on 6/26 and d/c'ed on 6/28 without any complications. Has had several days of worsening LLE pain, swelling, and redness. Called NSG clinic yesterday and followed up today, where she was sent to vascular lab. Duplex was done which showed acute DVT noted in the posterior tibial veins extending through the popliteal vein; also note is superficial thrombus of the greater saphenous vein and varicosities of the lower leg. On exam, pt with erythema along medial side of lower leg, with significant tenderness of R calf, mild swelling compared to R leg. Will check CBC and BMP to ensure appropriate dosing of meds for anticoagulation.  Cr unremarkable. Will give dose of lovenox here, as well as do lovenox education, and have pt f/u with her PCP for further anticoagulation management. Discussed with pt treatment at home, f/u, and indications for return.    Theotis Burrow, MD 09/09/11 2330

## 2011-09-09 NOTE — ED Provider Notes (Signed)
I saw and evaluated the patient, reviewed the resident's note and I agree with the findings and plan.  Pt seen and examined. Pt awake and alert, NAD, no chest pain or sob. Pt with DVT diagnosed by vascular lab earlier today.  She was started on lovenox in the ED and discharged with strict return precautions, she is agreeable with this plan.   Ethelda Chick, MD 09/09/11 [phone]

## 2011-09-09 NOTE — ED Notes (Signed)
Pt comes from vascular lab where left DVT was found to be. Pt had back surgery 1 week ago went to PCP today c/o leg pain, sent to vascular for evaluation. Pt left lower extremity is warm, painful to touch. Upon arrival from vascular pt tearful c/o back pain. Pt a x 4. Pt has family at bedside.

## 2011-09-09 NOTE — ED Notes (Signed)
Pt verbalized understanding of administration of home lovenox.

## 2011-09-09 NOTE — ED Notes (Signed)
Patient states lower back pain from surgery pain increases with movement currently 8-10 achy sharp.

## 2011-09-09 NOTE — Progress Notes (Signed)
VASCULAR LAB PRELIMINARY  PRELIMINARY  PRELIMINARY  PRELIMINARY  Left lower extremity venous duplex completed.    Preliminary report: Left :Positive for Acute DVT noted in the posterior tibial veins extending through the popliteal vein. Also note is superficial thrombus of the greater saphenous vein and varicosities of the lower leg. There is no evidence of a Baker's cyst.                                Right : no propagation to the right  Byrne Capek, RVS 09/09/2011, 4:05 PM

## 2011-09-22 ENCOUNTER — Encounter (HOSPITAL_COMMUNITY): Admission: RE | Payer: Self-pay | Source: Ambulatory Visit

## 2011-09-22 ENCOUNTER — Ambulatory Visit (HOSPITAL_COMMUNITY): Admission: RE | Admit: 2011-09-22 | Payer: Medicare Other | Source: Ambulatory Visit | Admitting: Neurosurgery

## 2011-09-22 SURGERY — LUMBAR LAMINECTOMY/DECOMPRESSION MICRODISCECTOMY 2 LEVELS
Anesthesia: General | Site: Back | Laterality: Left

## 2011-09-24 ENCOUNTER — Encounter (HOSPITAL_COMMUNITY): Payer: Self-pay | Admitting: *Deleted

## 2011-09-24 ENCOUNTER — Observation Stay (HOSPITAL_COMMUNITY): Payer: Medicare Other

## 2011-09-24 ENCOUNTER — Observation Stay (HOSPITAL_COMMUNITY)
Admission: EM | Admit: 2011-09-24 | Discharge: 2011-09-27 | Disposition: A | Discharge status: Home / Self Care | Payer: Medicare Other | Attending: Internal Medicine | Admitting: Internal Medicine

## 2011-09-24 ENCOUNTER — Emergency Department (HOSPITAL_COMMUNITY): Payer: Medicare Other

## 2011-09-24 DIAGNOSIS — I498 Other specified cardiac arrhythmias: Secondary | ICD-10-CM

## 2011-09-24 DIAGNOSIS — G43909 Migraine, unspecified, not intractable, without status migrainosus: Secondary | ICD-10-CM

## 2011-09-24 DIAGNOSIS — R079 Chest pain, unspecified: Secondary | ICD-10-CM | POA: Insufficient documentation

## 2011-09-24 DIAGNOSIS — R000 Tachycardia, unspecified: Secondary | ICD-10-CM | POA: Insufficient documentation

## 2011-09-24 DIAGNOSIS — I479 Paroxysmal tachycardia, unspecified: Secondary | ICD-10-CM

## 2011-09-24 DIAGNOSIS — R0602 Shortness of breath: Principal | ICD-10-CM | POA: Insufficient documentation

## 2011-09-24 DIAGNOSIS — R51 Headache: Secondary | ICD-10-CM | POA: Insufficient documentation

## 2011-09-24 DIAGNOSIS — R55 Syncope and collapse: Secondary | ICD-10-CM | POA: Diagnosis present

## 2011-09-24 DIAGNOSIS — G90A Postural orthostatic tachycardia syndrome (POTS): Secondary | ICD-10-CM

## 2011-09-24 DIAGNOSIS — I82409 Acute embolism and thrombosis of unspecified deep veins of unspecified lower extremity: Secondary | ICD-10-CM | POA: Diagnosis present

## 2011-09-24 DIAGNOSIS — R110 Nausea: Secondary | ICD-10-CM | POA: Insufficient documentation

## 2011-09-24 DIAGNOSIS — I951 Orthostatic hypotension: Secondary | ICD-10-CM

## 2011-09-24 DIAGNOSIS — R519 Headache, unspecified: Secondary | ICD-10-CM | POA: Diagnosis present

## 2011-09-24 DIAGNOSIS — I10 Essential (primary) hypertension: Secondary | ICD-10-CM | POA: Insufficient documentation

## 2011-09-24 DIAGNOSIS — K219 Gastro-esophageal reflux disease without esophagitis: Secondary | ICD-10-CM | POA: Insufficient documentation

## 2011-09-24 DIAGNOSIS — E871 Hypo-osmolality and hyponatremia: Secondary | ICD-10-CM | POA: Insufficient documentation

## 2011-09-24 HISTORY — DX: Other chronic pain: G89.29

## 2011-09-24 HISTORY — DX: Unspecified osteoarthritis, unspecified site: M19.90

## 2011-09-24 HISTORY — DX: Migraine, unspecified, not intractable, without status migrainosus: G43.909

## 2011-09-24 HISTORY — DX: Acute embolism and thrombosis of unspecified deep veins of unspecified lower extremity: I82.409

## 2011-09-24 HISTORY — DX: Dorsalgia, unspecified: M54.9

## 2011-09-24 LAB — CBC
Platelets: 439 10*3/uL — ABNORMAL HIGH (ref 150–400)
RDW: 13.2 % (ref 11.5–15.5)
WBC: 7.5 10*3/uL (ref 4.0–10.5)

## 2011-09-24 LAB — HEPATIC FUNCTION PANEL
ALT: 17 U/L (ref 0–35)
AST: 14 U/L (ref 0–37)
Albumin: 4.1 g/dL (ref 3.5–5.2)
Alkaline Phosphatase: 71 U/L (ref 39–117)
Bilirubin, Direct: 0.1 mg/dL (ref 0.0–0.3)
Indirect Bilirubin: 0.5 mg/dL (ref 0.3–0.9)
Total Bilirubin: 0.6 mg/dL (ref 0.3–1.2)
Total Protein: 7.4 g/dL (ref 6.0–8.3)

## 2011-09-24 LAB — BASIC METABOLIC PANEL
Chloride: 94 mEq/L — ABNORMAL LOW (ref 96–112)
GFR calc Af Amer: 75 mL/min — ABNORMAL LOW (ref >90)
GFR calc non Af Amer: 65 mL/min — ABNORMAL LOW (ref >90)
Potassium: 3.9 mEq/L (ref 3.5–5.1)

## 2011-09-24 LAB — URINALYSIS, ROUTINE W REFLEX MICROSCOPIC
Ketones, ur: NEGATIVE mg/dL
Leukocytes, UA: NEGATIVE
Nitrite: NEGATIVE
Protein, ur: NEGATIVE mg/dL
Urobilinogen, UA: 0.2 mg/dL (ref 0.0–1.0)

## 2011-09-24 LAB — CARDIAC PANEL(CRET KIN+CKTOT+MB+TROPI)
CK, MB: 1.5 ng/mL (ref 0.3–4.0)
Total CK: 52 U/L (ref 7–177)
Troponin I: <0.3 ng/mL (ref <0.30)

## 2011-09-24 LAB — PROTIME-INR: Prothrombin Time: 32.4 seconds — ABNORMAL HIGH (ref 11.6–15.2)

## 2011-09-24 LAB — POCT I-STAT TROPONIN I: Troponin i, poc: 0.03 ng/mL (ref 0.00–0.08)

## 2011-09-24 MED ORDER — ACETAMINOPHEN 325 MG PO TABS: 650.0000 mg | ORAL_TABLET | Freq: Four times a day (QID) | ORAL | Status: DC | PRN

## 2011-09-24 MED ORDER — ALBUTEROL SULFATE (5 MG/ML) 0.5% IN NEBU: 2.5000 mg | INHALATION_SOLUTION | RESPIRATORY_TRACT | Status: DC | PRN

## 2011-09-24 MED ORDER — SODIUM CHLORIDE 0.9 % IV SOLN
INTRAVENOUS | Status: DC
  Administered 2011-09-24 – 2011-09-26 (×3): via INTRAVENOUS

## 2011-09-24 MED ORDER — ACETAMINOPHEN 650 MG RE SUPP: 650.0000 mg | Freq: Four times a day (QID) | RECTAL | Status: DC | PRN

## 2011-09-24 MED ORDER — IOHEXOL 350 MG/ML SOLN
100.0000 mL | Freq: Once | INTRAVENOUS | Status: AC | PRN
  Administered 2011-09-24: 100 mL via INTRAVENOUS

## 2011-09-24 MED ORDER — ATORVASTATIN CALCIUM 20 MG PO TABS
20.0000 mg | ORAL_TABLET | Freq: Every day | ORAL | Status: DC
  Administered 2011-09-24 – 2011-09-26 (×3): 20 mg via ORAL
  Filled 2011-09-24 (×3): qty 1

## 2011-09-24 MED ORDER — ONDANSETRON HCL 4 MG PO TABS: 4.0000 mg | ORAL_TABLET | Freq: Four times a day (QID) | ORAL | Status: DC | PRN

## 2011-09-24 MED ORDER — METOCLOPRAMIDE HCL 10 MG PO TABS
10.0000 mg | ORAL_TABLET | Freq: Two times a day (BID) | ORAL | Status: DC | PRN
  Filled 2011-09-24: qty 1

## 2011-09-24 MED ORDER — ONDANSETRON HCL 4 MG/2ML IJ SOLN: 4.0000 mg | Freq: Four times a day (QID) | INTRAMUSCULAR | Status: DC | PRN

## 2011-09-24 MED ORDER — PANTOPRAZOLE SODIUM 40 MG PO TBEC
40.0000 mg | DELAYED_RELEASE_TABLET | Freq: Two times a day (BID) | ORAL | Status: DC
  Administered 2011-09-25 – 2011-09-27 (×4): 40 mg via ORAL
  Filled 2011-09-24 (×5): qty 1

## 2011-09-24 MED ORDER — OXYCODONE HCL 5 MG PO TABS: 5.0000 mg | ORAL_TABLET | ORAL | Status: DC | PRN

## 2011-09-24 MED ORDER — WARFARIN - PHARMACIST DOSING INPATIENT: Freq: Every day | Status: DC

## 2011-09-24 MED ORDER — CYCLOBENZAPRINE HCL 10 MG PO TABS
10.0000 mg | ORAL_TABLET | Freq: Three times a day (TID) | ORAL | Status: DC | PRN
  Filled 2011-09-24: qty 1

## 2011-09-24 MED ORDER — WARFARIN SODIUM 2.5 MG PO TABS
2.5000 mg | ORAL_TABLET | Freq: Once | ORAL | Status: AC
  Administered 2011-09-24: 2.5 mg via ORAL
  Filled 2011-09-24: qty 1

## 2011-09-24 MED ORDER — DILTIAZEM HCL ER COATED BEADS 240 MG PO CP24
240.0000 mg | ORAL_CAPSULE | Freq: Every day | ORAL | Status: DC
Start: 2011-09-25 — End: 2011-09-25
  Administered 2011-09-25: 240 mg via ORAL
  Filled 2011-09-24: qty 1

## 2011-09-24 MED ORDER — DILTIAZEM HCL ER 240 MG PO CP24: 240.0000 mg | ORAL_CAPSULE | Freq: Every day | ORAL | Status: DC

## 2011-09-24 MED ORDER — DOCUSATE SODIUM 100 MG PO CAPS
100.0000 mg | ORAL_CAPSULE | Freq: Two times a day (BID) | ORAL | Status: DC
  Administered 2011-09-24 – 2011-09-27 (×6): 100 mg via ORAL
  Filled 2011-09-24 (×7): qty 1

## 2011-09-24 NOTE — Progress Notes (Signed)
ANTICOAGULATION CONSULT NOTE - Initial Consult  Pharmacy Consult for warfarin Indication: DVT  No Known Allergies  Patient Measurements: Height: 5' (152.4 cm) Weight: 179 lb (81.194 kg) IBW/kg (Calculated) : 45.5    Vital Signs: Temp: 97.7 F (36.5 C) (07/17 1204) Temp src: Oral (07/17 1204) BP: 143/66 mmHg (07/17 1700) Pulse Rate: 111  (07/17 1700)  Labs:  Basename 09/24/11 1215  HGB 14.6  HCT 41.8  PLT 439*  APTT --  LABPROT 32.4*  INR 3.10*  HEPARINUNFRC --  CREATININE 0.88  CKTOTAL --  CKMB --  TROPONINI --    Estimated Creatinine Clearance: 56.2 ml/min (by C-G formula based on Cr of 0.88).   Medical History: Past Medical History  Diagnosis Date  . Headache   . Obesity, unspecified   . Stricture and stenosis of esophagus   . Diverticulosis of colon (without mention of hemorrhage)   . Personal history of colonic polyps 05/31/2007    adenomatous polyp  . Esophageal reflux   . Unspecified gastritis and gastroduodenitis without mention of hemorrhage   . Hiatal hernia   . Rheumatoid arthritis   . Scoliosis   . Hyperlipidemia   . Hypertension   . Osteoporosis   . Family history of malignant neoplasm of gastrointestinal tract   . Dysrhythmia     notes in epic  . Pneumonia     hx  . Fibromyalgia     Medications:  Warfarin 5mg  daily prior to admission  Assessment: 51 yof with complaints of dizziness and tachycardia presents to [facility]. Patient has significant past medical history of DVT on coumadin for the past few weeks. Her INR is just above goal at 3.1. No obvious signs of bleeding noted. CBC appears to within normal limits. Given elevated INR will give reduced dose tonight and likely be able to resume home dose tomorrow.  Goal of Therapy:  INR 2-3 Monitor platelets by anticoagulation protocol: Yes   Plan:  Warfarin 2.5mg  tonight  INR in am.  Severiano Gilbert 09/24/2011,6:15 PM

## 2011-09-24 NOTE — ED Notes (Signed)
Patient reports she is being tx for dvt post surgery.  She states yesterday she felt dizziness when standing up.  She is tachycardic as well.  Patient is alert and oriented at this time

## 2011-09-24 NOTE — Progress Notes (Signed)
Spoke with Dr. Rito Ehrlich regarding patient and need to do neuro checks/stroke swallow screen/NIH. He stated that patient is not a stroke patient and that it is not necessary. Will continue to monitor. Duwaine Maxin, RN

## 2011-09-24 NOTE — ED Provider Notes (Addendum)
History     CSN: [id number]  Arrival date & time 09/24/11  1154   First MD Initiated Contact with Patient 09/24/11 1224      Chief Complaint  Patient presents with  . Tachycardia  . DVT  . Dizziness  . Shortness of Breath    (Consider location/radiation/quality/duration/timing/severity/associated sxs/prior treatment) Patient is a 71 y.o. female presenting with shortness of breath. The history is provided by the patient.  Shortness of Breath  Associated symptoms include shortness of breath.  She has been treated for her DVT for the last several weeks. 3 days ago, she started noticing that her heart was racing which got worse if she stood up or walks. She is feeling generally weak but denies dizziness. She has had some vague left-sided chest discomfort but no dyspnea, nausea, vomiting. She is noted that her heart rate has been as high as 144 when she stands up. She does feel little better if she lays down. There've been no fever, chills, sweats.  Past Medical History  Diagnosis Date  . Headache   . Obesity, unspecified   . Stricture and stenosis of esophagus   . Diverticulosis of colon (without mention of hemorrhage)   . Personal history of colonic polyps 05/31/2007    adenomatous polyp  . Esophageal reflux   . Unspecified gastritis and gastroduodenitis without mention of hemorrhage   . Hiatal hernia   . Rheumatoid arthritis   . Scoliosis   . Hyperlipidemia   . Hypertension   . Osteoporosis   . Family history of malignant neoplasm of gastrointestinal tract   . Dysrhythmia     notes in epic  . Pneumonia     hx  . Fibromyalgia     Past Surgical History  Procedure Date  . Sigmoid resection / rectopexy 09/1999    Dr Earlene Plater  . Thumb surgery     trigger finger lft  . Salivary gland surgery     1 removed  . Shoulder surgery     right shoulder and collar bone  . Colonoscopy   . Upper gastrointestinal endoscopy   . Breast surgery     cyst lanced and drainded  . Tumor  removal     left leg  . Hernia repair     incisional  . Lumbar laminectomy/decompression microdiscectomy 09/03/2011    Procedure: LUMBAR LAMINECTOMY/DECOMPRESSION MICRODISCECTOMY 2 LEVELS;  Surgeon: Mariam Dollar, MD;  Location: [facility];  Service: Neurosurgery;  Laterality: Left;  Left Lumbar Three-Four, Lumbar Four-Five Decompressive Laminectomy    Family History  Problem Relation Age of Onset  . Pancreatic cancer Sister   . Stroke Mother   . Hypertension Mother   . Stroke Father   . Hypertension Father   . Diabetes Father   . Diabetes Sister     x 2    History  Substance Use Topics  . Smoking status: Never Smoker   . Smokeless tobacco: Never Used  . Alcohol Use: No    OB History    Grav Para Term Preterm Abortions TAB SAB Ect Mult Living                  Review of Systems  Respiratory: Positive for shortness of breath.   All other systems reviewed and are negative.    Allergies  Review of patient's allergies indicates no known allergies.  Home Medications   Current Outpatient Rx  Name Route Sig Dispense Refill  . ASPIRIN 81 MG PO TABS Oral Take  81 mg by mouth daily as needed.    . ATORVASTATIN CALCIUM 20 MG PO TABS Oral Take 20 mg by mouth daily.      [name] VITAMIN B-12 IJ Injection Inject as directed every 30 (thirty) days.    . CYCLOBENZAPRINE HCL 10 MG PO TABS Oral Take 10 mg by mouth 3 (three) times daily as needed. Muscle spasms    . DILTIAZEM HCL 240 MG PO CP24 Oral Take 240 mg by mouth daily.      [name] ESOMEPRAZOLE MAGNESIUM 40 MG PO CPDR Oral Take 1 capsule (40 mg total) by mouth 2 (two) times daily. 180 capsule 3  . LISINOPRIL-HYDROCHLOROTHIAZIDE 20-25 MG PO TABS Oral Take 1 tablet by mouth daily.      [name] METOCLOPRAMIDE HCL 10 MG PO TABS Oral Take 10 mg by mouth 2 (two) times daily as needed. For migraine headache    . PROMETHAZINE HCL 25 MG RE SUPP Rectal Place 25 mg rectally every 6 (six) hours as needed.      [name] PROMETHAZINE HCL 25 MG PO TABS Oral Take 25  mg by mouth 3 (three) times daily as needed.      . WARFARIN SODIUM 5 MG PO TABS Oral Take 5 mg by mouth daily.      BP 150/75  Pulse 128  Temp 97.7 F (36.5 C) (Oral)  Resp 18  Ht 5' (1.524 m)  Wt 179 lb (81.194 kg)  BMI 34.96 kg/m2  SpO2 97%  Physical Exam  Nursing note and vitals reviewed.  17-year-old female who is resting comfortably in no acute distress. Vital signs are significant for tachycardia with heart rate 128, and mild hypertension with blood pressure 150/75. Oxygen saturation is 97% which is normal. Head is normocephalic and atraumatic. PERRLA, EOMI. Neck is nontender and supple. Back is nontender. Lungs are clear without rales, wheezes, rhonchi. Heart is tachycardic and regular without murmur. Abdomen is soft, flat, nontender without masses or hepatosplenomegaly. Extremities: There is mild tenderness to palpation the left calf which is where her DVT is. Otherwise, no cyanosis or edema. There is full range of motion present. Skin is warm and dry without rash. Neurologic: Mental status is normal, cranial nerves are intact, there no motor or sensory deficits.  ED Course  Procedures (including critical care time)  Results for orders placed during the hospital encounter of 09/24/11  CBC      Component Value Range   WBC 7.5  4.0 - 10.5 K/uL   RBC 5.09  3.87 - 5.11 MIL/uL   Hemoglobin 14.6  12.0 - 15.0 g/dL   HCT 14.7  82.9 - 56.2 %   MCV 82.1  78.0 - 100.0 fL   MCH 28.7  26.0 - 34.0 pg   MCHC 34.9  30.0 - 36.0 g/dL   RDW 13.0  86.5 - 78.4 %   Platelets 439 (*) 150 - 400 K/uL  BASIC METABOLIC PANEL      Component Value Range   Sodium 131 (*) 135 - 145 mEq/L   Potassium 3.9  3.5 - 5.1 mEq/L   Chloride 94 (*) 96 - 112 mEq/L   CO2 22  19 - 32 mEq/L   Glucose, Bld 106 (*) 70 - 99 mg/dL   BUN 13  6 - 23 mg/dL   Creatinine, Ser 6.96  0.50 - 1.10 mg/dL   Calcium 9.7  8.4 - 29.5 mg/dL   GFR calc non Af Amer 65 (*) >90 mL/min   GFR calc Af Denyse Dago  75 (*) >90 mL/min    PROTIME-INR      Component Value Range   Prothrombin Time 32.4 (*) 11.6 - 15.2 seconds   INR 3.10 (*) 0.00 - 1.49  POCT I-STAT TROPONIN I      Component Value Range   Troponin i, poc 0.03  0.00 - 0.08 ng/mL   Comment 3           HEPATIC FUNCTION PANEL      Component Value Range   Total Protein 7.4  6.0 - 8.3 g/dL   Albumin 4.1  3.5 - 5.2 g/dL   AST 14  0 - 37 U/L   ALT 17  0 - 35 U/L   Alkaline Phosphatase 71  39 - 117 U/L   Total Bilirubin 0.6  0.3 - 1.2 mg/dL   Bilirubin, Direct 0.1  0.0 - 0.3 mg/dL   Indirect Bilirubin 0.5  0.3 - 0.9 mg/dL   Dg Chest 2 View  1/61/0960  *RADIOLOGY REPORT*  Clinical Data: Tachycardia, dizziness, shortness of breath.  CHEST - 2 VIEW  Comparison: 09/02/2011  Findings: Heart and mediastinal contours are within normal limits. No focal opacities or effusions.  No acute bony abnormality.  IMPRESSION: No active cardiopulmonary disease.  Original Report Authenticated By: Cyndie Chime, M.D.   Ct Angio Chest W/cm &/or Wo Cm  09/24/2011  *RADIOLOGY REPORT*  Clinical Data: Tachycardia, shortness of breath.  DVT.  CT ANGIOGRAPHY CHEST  Technique:  Multidetector CT imaging of the chest using the standard protocol during bolus administration of intravenous contrast. Multiplanar reconstructed images including MIPs were obtained and reviewed to evaluate the vascular anatomy.  Contrast: OMNIPAQUE IOHEXOL 350 MG/ML SOLN  Comparison: 06/05/2007  Findings: No filling defects in the pulmonary arteries to suggest pulmonary emboli.  Heart is upper limits normal in size.  Aorta is normal caliber. No mediastinal, hilar, or axillary adenopathy. Visualized thyroid and chest wall soft tissues unremarkable. Imaging into the upper abdomen shows no acute findings.  Small gallstone noted within the gallbladder.  Minimal lingular and left base atelectasis or scarring.  Right lung is clear.  No effusions.  No acute bony abnormality.  Mild degenerative changes in the thoracic spine.   IMPRESSION: No evidence of pulmonary embolus.  No acute process.  Cholelithiasis.  Original Report Authenticated By: Cyndie Chime, M.D.      Date: 09/24/2011  Rate: 120  Rhythm: sinus tachycardia  QRS Axis: right  Intervals: normal  ST/T Wave abnormalities: normal  Conduction Disutrbances:left posterior fascicular block  Narrative Interpretation: Sinus tachycardia, left posterior fascicular hemiblock. No old ECG available for comparison.  Old EKG Reviewed: none available    1. Postural orthostatic tachycardia syndrome       MDM  Tachycardia of uncertain cause. Hemoglobin has not changed and in fact is slightly higher than baseline and electrolytes are normal except for mild hyponatremia. ECG shows that show any evidence of cardiac ischemia and troponin is normal. CT angiogram will be obtained to make sure that she is not having pulmonary emboli.  CT is negative for pulmonary emboli. Orthostatic vital signs show significant rise in pulse without change in blood pressure. I would like to admit her for further evaluation. 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 Jearld Pies of [facility] cardiology in the past. Case discussed with Dr. Osvaldo Shipper of [facility] who agrees to admit the patient under observation  status.    Dione Booze, MD 09/24/11 1646  Dione Booze, MD 09/24/11 [id number]

## 2011-09-24 NOTE — H&P (Signed)
[name] is an 71 y.o. female.    PCP: Beverley Fiedler, MD  She has been seen by Dr. Shirlee Latch, with [facility]  Chief Complaint: Dizziness and high heart rate  HPI: This is a 71 year old, white female with a past medical history of migraine headaches. Hypertension. She recently underwent lumbar laminectomy for spinal stenosis on September 03, 2011. Subsequent to this surgical procedure she started having left leg pain, and was found to have a deep venous thrombosis. She was started on warfarin earlier this month. She tells me that ever since her surgery she's had difficulty with dizziness. Whenever she would stand up she would feel that her heart was racing. She would feel dizzy, and have a funny feeling in her head. Denies any sensation of spinning of the room around her. Had some nausea, poor appetite for the last couple weeks. EMT was called yesterday and they checked her white of signs in everything was stable. They suggested that she may have to hydrate herself and so, she drank Gatorade and fluids. However, her symptoms persisted, and, so, she decided to seek attention today. She's had occasional episodes of right hand numbness with position, which goes away when she extends her elbow. She also has history of migraine headaches, but denies any headache at this time. She denies any syncopal episodes. No history of fever. She's had some chills. Denies any sick contacts. No diarrhea or vomiting. No chest pains. Some shortness of breath with exertion which is chronic. She tells me that she has had these symptoms evaluated many times over the last many years and most recently by Dr. Shirlee Latch in February of 2013 . She underwent a stress test earlier this year, which was unremarkable. EF was normal. No medication changes were made. She's had the urine test for catecholamines, which also been unremarkable according to the patient. Tells me that her back and her legs are feeling much better after  the surgery and hasn't had any complications.   Home Medications: Prior to Admission medications   Medication Sig Start Date End Date Taking? Authorizing Provider  aspirin 81 MG tablet Take 81 mg by mouth daily as needed.   Yes Historical Provider, MD  atorvastatin (LIPITOR) 20 MG tablet Take 20 mg by mouth daily.     Yes Historical Provider, MD  Cyanocobalamin (VITAMIN B-12 IJ) Inject as directed every 30 (thirty) days.   Yes Historical Provider, MD  cyclobenzaprine (FLEXERIL) 10 MG tablet Take 10 mg by mouth 3 (three) times daily as needed. Muscle spasms   Yes Historical Provider, MD  diltiazem (DILACOR XR) 240 MG 24 hr capsule Take 240 mg by mouth daily.     Yes Historical Provider, MD  esomeprazole (NEXIUM) 40 MG capsule Take 1 capsule (40 mg total) by mouth 2 (two) times daily. 02/06/11  Yes Mardella Layman, MD  lisinopril-hydrochlorothiazide (PRINZIDE,ZESTORETIC) 20-25 MG per tablet Take 1 tablet by mouth daily.     Yes Historical Provider, MD  metoCLOPramide (REGLAN) 10 MG tablet Take 10 mg by mouth 2 (two) times daily as needed. For migraine headache   Yes Historical Provider, MD  promethazine (PHENERGAN) 25 MG suppository Place 25 mg rectally every 6 (six) hours as needed.     Yes Historical Provider, MD  promethazine (PHENERGAN) 25 MG tablet Take 25 mg by mouth 3 (three) times daily as needed.     Yes Historical Provider, MD  warfarin (COUMADIN) 5 MG tablet Take 5 mg by mouth daily.  Yes Historical Provider, MD    Allergies: No Known Allergies  Past Medical History: Past Medical History  Diagnosis Date  . Headache   . Obesity, unspecified   . Stricture and stenosis of esophagus   . Diverticulosis of colon (without mention of hemorrhage)   . Personal history of colonic polyps 05/31/2007    adenomatous polyp  . Esophageal reflux   . Unspecified gastritis and gastroduodenitis without mention of hemorrhage   . Hiatal hernia   . Rheumatoid arthritis   . Scoliosis   .  Hyperlipidemia   . Hypertension   . Osteoporosis   . Family history of malignant neoplasm of gastrointestinal tract   . Dysrhythmia     notes in epic  . Pneumonia     hx  . Fibromyalgia     Past Surgical History  Procedure Date  . Sigmoid resection / rectopexy 09/1999    Dr Earlene Plater  . Thumb surgery     trigger finger lft  . Salivary gland surgery     1 removed  . Shoulder surgery     right shoulder and collar bone  . Colonoscopy   . Upper gastrointestinal endoscopy   . Breast surgery     cyst lanced and drainded  . Tumor removal     left leg  . Hernia repair     incisional  . Lumbar laminectomy/decompression microdiscectomy 09/03/2011    Procedure: LUMBAR LAMINECTOMY/DECOMPRESSION MICRODISCECTOMY 2 LEVELS;  Surgeon: Mariam Dollar, MD;  Location: [facility];  Service: Neurosurgery;  Laterality: Left;  Left Lumbar Three-Four, Lumbar Four-Five Decompressive Laminectomy    Social History:  reports that she has never smoked. She has never used smokeless tobacco. She reports that she does not drink alcohol or use illicit drugs.  Family History:  Family History  Problem Relation Age of Onset  . Pancreatic cancer Sister   . Stroke Mother   . Hypertension Mother   . Stroke Father   . Hypertension Father   . Diabetes Father   . Diabetes Sister     x 2    Review of Systems - History obtained from the patient General ROS: positive for  - chills and fatigue Psychological ROS: negative Ophthalmic ROS: negative ENT ROS: negative Allergy and Immunology ROS: negative Hematological and Lymphatic ROS: negative Endocrine ROS: negative Respiratory ROS: as in hpi Cardiovascular ROS: as in hpi Gastrointestinal ROS: no abdominal pain, change in bowel habits, or black or bloody stools Genito-Urinary ROS: no dysuria, trouble voiding, or hematuria Musculoskeletal ROS: negative Neurological ROS: no TIA or stroke symptoms Dermatological ROS: negative  Physical Examination Blood  pressure 111/61, pulse 104, temperature 97.7 F (36.5 C), temperature source Oral, resp. rate 16, height 5' (1.524 m), weight 81.194 kg (179 lb), SpO2 100.00%.  General appearance: alert, cooperative, appears stated age and no distress Head: Normocephalic, without obvious abnormality, atraumatic Eyes: conjunctivae/corneas clear. PERRL, EOM's intact.  Throat: lips, mucosa, and tongue normal; teeth and gums normal Neck: no adenopathy, no carotid bruit, no JVD, supple, symmetrical, trachea midline and thyroid not enlarged, symmetric, no tenderness/mass/nodules Back: symmetric, no curvature. ROM normal. No CVA tenderness. Resp: clear to auscultation bilaterally Cardio: regular rate and rhythm, S1, S2 normal, no murmur, click, rub or gallop GI: soft, non-tender; bowel sounds normal; no masses,  no organomegaly Extremities: extremities normal, atraumatic, no cyanosis. Left Leg is more swollen compared to right. Pulses: 2+ and symmetric Skin: Skin color, texture, turgor normal. No rashes or lesions Lymph nodes: Cervical, supraclavicular,  and axillary nodes normal. Neurologic: Grossly normal. No focal deficits are noted. Equal strength b/l LE.  Laboratory Data: Results for orders placed during the hospital encounter of 09/24/11 (from the past 48 hour(s))  CBC     Status: Abnormal   Collection Time   09/24/11 12:15 PM      Component Value Range Comment   WBC 7.5  4.0 - 10.5 K/uL    RBC 5.09  3.87 - 5.11 MIL/uL    Hemoglobin 14.6  12.0 - 15.0 g/dL    HCT 16.1  09.6 - 04.5 %    MCV 82.1  78.0 - 100.0 fL    MCH 28.7  26.0 - 34.0 pg    MCHC 34.9  30.0 - 36.0 g/dL    RDW 40.9  81.1 - 91.4 %    Platelets 439 (*) 150 - 400 K/uL   BASIC METABOLIC PANEL     Status: Abnormal   Collection Time   09/24/11 12:15 PM      Component Value Range Comment   Sodium 131 (*) 135 - 145 mEq/L    Potassium 3.9  3.5 - 5.1 mEq/L    Chloride 94 (*) 96 - 112 mEq/L    CO2 22  19 - 32 mEq/L    Glucose, Bld 106 (*) 70  - 99 mg/dL    BUN 13  6 - 23 mg/dL    Creatinine, Ser 7.82  0.50 - 1.10 mg/dL    Calcium 9.7  8.4 - 95.6 mg/dL    GFR calc non Af Amer 65 (*) >90 mL/min    GFR calc Af Amer 75 (*) >90 mL/min   PROTIME-INR     Status: Abnormal   Collection Time   09/24/11 12:15 PM      Component Value Range Comment   Prothrombin Time 32.4 (*) 11.6 - 15.2 seconds    INR 3.10 (*) 0.00 - 1.49   HEPATIC FUNCTION PANEL     Status: Normal   Collection Time   09/24/11 12:15 PM      Component Value Range Comment   Total Protein 7.4  6.0 - 8.3 g/dL    Albumin 4.1  3.5 - 5.2 g/dL    AST 14  0 - 37 U/L    ALT 17  0 - 35 U/L    Alkaline Phosphatase 71  39 - 117 U/L    Total Bilirubin 0.6  0.3 - 1.2 mg/dL    Bilirubin, Direct 0.1  0.0 - 0.3 mg/dL    Indirect Bilirubin 0.5  0.3 - 0.9 mg/dL   POCT I-STAT TROPONIN I     Status: Normal   Collection Time   09/24/11 12:32 PM      Component Value Range Comment   Troponin i, poc 0.03  0.00 - 0.08 ng/mL    Comment 3              Radiology Reports: Dg Chest 2 View  09/24/2011  *RADIOLOGY REPORT*  Clinical Data: Tachycardia, dizziness, shortness of breath.  CHEST - 2 VIEW  Comparison: 09/02/2011  Findings: Heart and mediastinal contours are within normal limits. No focal opacities or effusions.  No acute bony abnormality.  IMPRESSION: No active cardiopulmonary disease.  Original Report Authenticated By: Cyndie Chime, M.D.   Ct Angio Chest W/cm &/or Wo Cm  09/24/2011  *RADIOLOGY REPORT*  Clinical Data: Tachycardia, shortness of breath.  DVT.  CT ANGIOGRAPHY CHEST  Technique:  Multidetector CT imaging of the chest using  the standard protocol during bolus administration of intravenous contrast. Multiplanar reconstructed images including MIPs were obtained and reviewed to evaluate the vascular anatomy.  Contrast: OMNIPAQUE IOHEXOL 350 MG/ML SOLN  Comparison: 06/05/2007  Findings: No filling defects in the pulmonary arteries to suggest pulmonary emboli.  Heart is upper  limits normal in size.  Aorta is normal caliber. No mediastinal, hilar, or axillary adenopathy. Visualized thyroid and chest wall soft tissues unremarkable. Imaging into the upper abdomen shows no acute findings.  Small gallstone noted within the gallbladder.  Minimal lingular and left base atelectasis or scarring.  Right lung is clear.  No effusions.  No acute bony abnormality.  Mild degenerative changes in the thoracic spine.  IMPRESSION: No evidence of pulmonary embolus.  No acute process.  Cholelithiasis.  Original Report Authenticated By: Cyndie Chime, M.D.    Electrocardiogram: Sinus tachycardia at 120 beats per minute. Normal axis. Intervals are normal. There appears to be a left fascicular block. No concerning ST changes or T-wave changes are noted.  Assessment/Plan  Principal Problem:  *Near syncope Active Problems:  GERD  HEADACHE, CHRONIC  Hypertension  Tachycardia, paroxysmal  DVT (deep venous thrombosis)  Hyponatremia   #1 near syncope with paroxysmal tachycardia, which is postural most of the time: Her heart rate went up to 140 this morning. And she does feel somewhat lightheaded when this happens. Her sodium is low. This could be suggestive of dehydration. We will check a UA. We will give her fluids. We'll check a TSH level. I think it is prudent at this time to get an echocardiogram to rule out valvular abnormalities. Recheck carotid Dopplers. CT of the head will also be considered, because she's on anticoagulation, although she denies any falls. We will notify Dr. Shirlee Latch tomorrow of this admission.  #2 history of recent deep venous thrombosis: Continue with warfarin. Her INR is slightly supratherapeutic, but not of concern.  #3 history of hypertension: Continue with current antihypertensive medications except for the diuretic. Monitor blood pressures closely.  #4 hyponatremia: Could be due to dehydration. We will give her fluids. Repeat the sodium levels in the  morning.  #5 history of GERD: Continue with proton pump inhibitors.  #6 CODE STATUS: She is a full code.  #7 DVT, prophylaxis. She is already on warfarin.  Further management decisions will depend on results of further testing and patient's response to treatment.  [facility]  [facility] Pager [phone]  09/24/2011, 5:35 PM

## 2011-09-25 ENCOUNTER — Observation Stay (HOSPITAL_COMMUNITY): Payer: Medicare Other

## 2011-09-25 DIAGNOSIS — E871 Hypo-osmolality and hyponatremia: Secondary | ICD-10-CM

## 2011-09-25 DIAGNOSIS — R110 Nausea: Secondary | ICD-10-CM | POA: Diagnosis present

## 2011-09-25 DIAGNOSIS — I517 Cardiomegaly: Secondary | ICD-10-CM

## 2011-09-25 DIAGNOSIS — I498 Other specified cardiac arrhythmias: Secondary | ICD-10-CM

## 2011-09-25 DIAGNOSIS — R42 Dizziness and giddiness: Secondary | ICD-10-CM

## 2011-09-25 DIAGNOSIS — R55 Syncope and collapse: Secondary | ICD-10-CM

## 2011-09-25 LAB — COMPREHENSIVE METABOLIC PANEL
ALT: 13 U/L (ref 0–35)
Alkaline Phosphatase: 62 U/L (ref 39–117)
BUN: 16 mg/dL (ref 6–23)
CO2: 24 mEq/L (ref 19–32)
GFR calc Af Amer: 71 mL/min — ABNORMAL LOW (ref >90)
GFR calc non Af Amer: 62 mL/min — ABNORMAL LOW (ref >90)
Glucose, Bld: 117 mg/dL — ABNORMAL HIGH (ref 70–99)
Potassium: 3.8 mEq/L (ref 3.5–5.1)
Sodium: 135 mEq/L (ref 135–145)
Total Protein: 6.3 g/dL (ref 6.0–8.3)

## 2011-09-25 LAB — CBC
HCT: 38.9 % (ref 36.0–46.0)
MCHC: 33.7 g/dL (ref 30.0–36.0)
Platelets: 379 10*3/uL (ref 150–400)
RDW: 13.7 % (ref 11.5–15.5)
WBC: 8.2 10*3/uL (ref 4.0–10.5)

## 2011-09-25 LAB — CARDIAC PANEL(CRET KIN+CKTOT+MB+TROPI)
CK, MB: 1.4 ng/mL (ref 0.3–4.0)
CK, MB: 1.8 ng/mL (ref 0.3–4.0)
Total CK: 45 U/L (ref 7–177)
Total CK: 57 U/L (ref 7–177)
Troponin I: <0.3 ng/mL (ref <0.30)

## 2011-09-25 LAB — PROTIME-INR: Prothrombin Time: 36.9 seconds — ABNORMAL HIGH (ref 11.6–15.2)

## 2011-09-25 MED ORDER — METOPROLOL TARTRATE 25 MG PO TABS
25.0000 mg | ORAL_TABLET | Freq: Two times a day (BID) | ORAL | Status: DC
  Administered 2011-09-25 – 2011-09-27 (×5): 25 mg via ORAL
  Filled 2011-09-25 (×7): qty 1

## 2011-09-25 NOTE — Progress Notes (Signed)
Orthostatics obtained, see doc flowsheets for results; pt did c/o some nausea but no vomiting; will continue to monitor

## 2011-09-25 NOTE — Progress Notes (Signed)
PCP: Beverley Fiedler, MD  Brief HPI:  This is a 71 year old, white female with a past medical history of migraine headaches and Hypertension. She recently underwent lumbar laminectomy for spinal stenosis on September 03, 2011. Subsequent to this surgical procedure she started having left leg pain, and was found to have a deep venous thrombosis. She was started on warfarin earlier this month. She tells me that ever since her surgery she's had difficulty with dizziness. Whenever she would stand up she would feel that her heart was racing. She would feel dizzy, and have a funny feeling in her head. Denies any sensation of spinning of the room around her. Had some nausea, poor appetite for the last couple weeks. EMT was called yesterday and they checked her white of signs in everything was stable. They suggested that she may have to hydrate herself and so, she drank Gatorade and fluids. However, her symptoms persisted, and, so, she decided to seek attention. She's had occasional episodes of right hand numbness with position, which goes away when she extends her elbow. She also has history of migraine headaches, but denies any headache at this time. She denies any syncopal episodes. No history of fever. She's had some chills. Denies any sick contacts. No diarrhea or vomiting. No chest pains. Some shortness of breath with exertion which is chronic. She tells me that she has had these symptoms evaluated many times over the last many years and most recently by Dr. Shirlee Latch in February of 2013 . She underwent a stress test earlier this year, which was unremarkable. EF was normal. No medication changes were made. She's had the urine test for catecholamines, which also been unremarkable according to the patient. Tells me that her back and her legs are feeling much better after the surgery and hasn't had any complications.  Past medical history:  Past Medical History  Diagnosis Date  . Obesity, unspecified   . Stricture  and stenosis of esophagus   . Diverticulosis of colon (without mention of hemorrhage)   . Personal history of colonic polyps 05/31/2007    adenomatous polyp  . Esophageal reflux   . Unspecified gastritis and gastroduodenitis without mention of hemorrhage   . Hiatal hernia   . Scoliosis   . Hyperlipidemia   . Hypertension   . Osteoporosis   . Family history of malignant neoplasm of gastrointestinal tract   . Dysrhythmia     notes in epic  . DVT of leg (deep venous thrombosis) 09/2011    LLE  . Pneumonia     "3 times at least" (09/24/11)  . Migraines 09/24/11    "often recently;  more controlled now"  . Rheumatoid arthritis   . Arthritis     "got alot; especially in my spine"  . Fibromyalgia     "dx'd w/it when it first came out"  . Chronic back pain     "all of it"    Consultants: Will consult [facility] cardiology  Procedures: None so far  Subjective: Patient says she doesn't feel as well today. Still feels her HR gets high when she stands up. No syncopal episodes. Complains of nausea. No vomiting. No abdominal pain.  Objective: Vital signs in last 24 hours: Temp:  [97.7 F (36.5 C)-98.3 F (36.8 C)] 98.2 F (36.8 C) (07/18 0500) Pulse Rate:  [85-128] 122  (07/18 0507) Resp:  [13-20] 20  (07/18 0507) BP: (86-150)/(60-78) 108/78 mmHg (07/18 0507) SpO2:  [95 %-100 %] 98 % (07/18 0507) Weight:  [81.194 kg (  179 lb)] 81.194 kg (179 lb) (07/17 1159) Weight change:  Last BM Date: 09/24/11  Intake/Output from previous day: 07/17 0701 - 07/18 0700 In: -  Out: 350 [Drains:350] Intake/Output this shift:    General appearance: alert, cooperative, appears stated age and moderately obese Head: Normocephalic, without obvious abnormality, atraumatic Resp: clear to auscultation bilaterally Cardio: regular rate and rhythm, S1, S2 normal, no murmur, click, rub or gallop GI: soft, non-tender; bowel sounds normal; no masses,  no organomegaly Extremities: extremities normal, atraumatic,  no cyanosis or edema Pulses: 2+ and symmetric Neurologic: Grossly normal. No focal deficits.  Lab Results:  Basename 09/25/11 0225 09/24/11 1215  WBC 8.2 7.5  HGB 13.1 14.6  HCT 38.9 41.8  PLT 379 439*   BMET  Basename 09/25/11 0225 09/24/11 1215  NA 135 131*  K 3.8 3.9  CL 99 94*  CO2 24 22  GLUCOSE 117* 106*  BUN 16 13  CREATININE 0.92 0.88  CALCIUM 9.0 9.7  ALT 13 17    Studies/Results: Dg Chest 2 View  09/24/2011  *RADIOLOGY REPORT*  Clinical Data: Tachycardia, dizziness, shortness of breath.  CHEST - 2 VIEW  Comparison: 09/02/2011  Findings: Heart and mediastinal contours are within normal limits. No focal opacities or effusions.  No acute bony abnormality.  IMPRESSION: No active cardiopulmonary disease.  Original Report Authenticated By: Cyndie Chime, M.D.   Ct Head Wo Contrast  09/24/2011  *RADIOLOGY REPORT*  Clinical Data: Dizziness, the patient is on blood thinners  CT HEAD WITHOUT CONTRAST  Technique:  Contiguous axial images were obtained from the base of the skull through the vertex without contrast.  Comparison: CT brain of 06/05/2007  Findings: The ventricular system is stable in size and configuration, and the septum is in a normal midline position.  The fourth ventricle and basilar cisterns appear stable.  Mild small vessel ischemic change is noted in the periventricular white matter.  No acute hemorrhage, mass lesion, or infarction is seen. On bone window images, no calvarial abnormality is seen.  IMPRESSION: No acute intracranial abnormality.  Mild small vessel ischemic change is stable.  Original Report Authenticated By: Juline Patch, M.D.   Ct Angio Chest W/cm &/or Wo Cm  09/24/2011  *RADIOLOGY REPORT*  Clinical Data: Tachycardia, shortness of breath.  DVT.  CT ANGIOGRAPHY CHEST  Technique:  Multidetector CT imaging of the chest using the standard protocol during bolus administration of intravenous contrast. Multiplanar reconstructed images including MIPs were  obtained and reviewed to evaluate the vascular anatomy.  Contrast: OMNIPAQUE IOHEXOL 350 MG/ML SOLN  Comparison: 06/05/2007  Findings: No filling defects in the pulmonary arteries to suggest pulmonary emboli.  Heart is upper limits normal in size.  Aorta is normal caliber. No mediastinal, hilar, or axillary adenopathy. Visualized thyroid and chest wall soft tissues unremarkable. Imaging into the upper abdomen shows no acute findings.  Small gallstone noted within the gallbladder.  Minimal lingular and left base atelectasis or scarring.  Right lung is clear.  No effusions.  No acute bony abnormality.  Mild degenerative changes in the thoracic spine.  IMPRESSION: No evidence of pulmonary embolus.  No acute process.  Cholelithiasis.  Original Report Authenticated By: Cyndie Chime, M.D.   Mr Brain Wo Contrast  09/25/2011  *RADIOLOGY REPORT*  Clinical Data: Dizziness.  Elevated heart rate.  2 weeks postop from back surgery.  MRI HEAD WITHOUT CONTRAST  Technique:  Multiplanar, multiecho pulse sequences of the brain and surrounding structures were obtained according to standard  protocol without intravenous contrast.  Comparison: 09/24/2011 head CT.  No comparison brain MR.  Findings: Artifact left cerebellum.  Small artifact right pons. Overall, no acute infarct noted.  Remote small infarct mid right corona radiata.  Mild to moderate small vessel disease type changes.  No intracranial hemorrhage.  No intracranial mass lesion detected on this unenhanced exam.  Major intracranial vascular structures are patent.  Mild global atrophy without hydrocephalus.  Exophthalmos.  Minimal paranasal sinus mucosal thickening.  Partially empty sella.  IMPRESSION: No acute infarct.  Mild to moderate small vessel disease type changes.  Please see above.  Original Report Authenticated By: Fuller Canada, M.D.    Medications:  Scheduled:    . atorvastatin  20 mg Oral Daily  . diltiazem  240 mg Oral Daily  . docusate  sodium  100 mg Oral BID  . pantoprazole  40 mg Oral BID AC  . warfarin  2.5 mg Oral Once  . Warfarin - Pharmacist Dosing Inpatient   Does not apply q1800  . DISCONTD: diltiazem  240 mg Oral Daily   Continuous:    . sodium chloride 100 mL/hr at 09/24/11 1855   WUJ:[id number], acetaminophen, albuterol, cyclobenzaprine, iohexol, metoCLOPramide, ondansetron (ZOFRAN) IV, ondansetron, oxyCODONE  Assessment/Plan:  Principal Problem:  *Near syncope Active Problems:  GERD  HEADACHE, CHRONIC  Hypertension  Tachycardia, paroxysmal  DVT (deep venous thrombosis)  Hyponatremia  Nausea    Near syncope with paroxysmal tachycardia, which is postural most of the time Telemetry reveals episodes of tachycardia upto 140. Appears to be sinus tach. These episodes seem to correspond to when she stood up. She seems to be hydrated now. Continues to have these episodes. She also mentions some chest discomfort. Cardiac enzymes are normal. No PE seen on CT. Patient requesting to see her cardiologist. Reasonable to consult. TSH was normal. ECHO is pending. UA was unremarkable. Carotid Dopplers are pending.  History of recent deep venous thrombosis Continue with warfarin.  Nausea Could be related to above. Cholelithiasis was noted on CT. But patient does not have any abdominal pain. LFT's are normal. Consider US abdomen. Continue PPI.  Recent Lumbar Laminectomy Stable with no neurological deficits. PT/OT  History of hypertension Continue with current antihypertensive medications except for the diuretic. Monitor blood pressures closely.   Hyponatremia Improved with hydration. Could have been low due to dehydration.   History of GERD Continue with proton pump inhibitors.   CODE STATUS She is a full code.   DVT prophylaxis She is already on warfarin.    LOS: 1 day   [facility]  [facility] Pager [phone] 09/25/2011, 9:14 AM

## 2011-09-25 NOTE — Progress Notes (Signed)
*  PRELIMINARY RESULTS* Echocardiogram 2D Echocardiogram has been performed.  [name] 09/25/2011, 11:30 AM

## 2011-09-25 NOTE — Progress Notes (Signed)
VASCULAR LAB PRELIMINARY  PRELIMINARY  PRELIMINARY  PRELIMINARY  Carotid duplex completed.    Preliminary report: No significant ICA stenosis.  No significant plaque visualized.  Vertebral artery flow antegrade. [name], 09/25/2011, 2:26 PM

## 2011-09-25 NOTE — Evaluation (Signed)
Occupational Therapy Evaluation Patient Details Name: [name] MRN: [id number] DOB: 1940/06/02 Today's Date: 09/25/2011 Time: 1191-4782 OT Time Calculation (min): 23 min  OT Assessment / Plan / Recommendation Clinical Impression  71 yo female admitted due to near syncope. Pt is at or near baseline for ADLs . Pt has elevated HR during session however does not require skilled OT acutely.    OT Assessment  Patient does not need any further OT services    Follow Up Recommendations  No OT follow up    Barriers to Discharge  HR elevated to 130s during session    Equipment Recommendations  None recommended by OT    Recommendations for Other Services    Frequency       Precautions / Restrictions Precautions Precautions: Back;Fall Precaution Comments: pt able to recall 2 out 3 back precautions. Pt neglecting the "twisting" precaution. Pt able to verbalize motions and with cueing 3 out 3 precautions Pt without aspen brace but pt is allowed up for short period of time per Dr Wynetta Emery.  Restrictions Weight Bearing Restrictions: No   Pertinent Vitals/Pain none    ADL  Grooming: Performed;Wash/dry hands;Modified independent Where Assessed - Grooming: Unsupported standing Upper Body Bathing: Performed;Chest;Right arm;Abdomen;Left arm;Modified independent Where Assessed - Upper Body Bathing: Unsupported standing Lower Body Bathing: Performed;Modified independent Where Assessed - Lower Body Bathing: Unsupported standing Upper Body Dressing: Performed;Modified independent Where Assessed - Upper Body Dressing: Unsupported standing Lower Body Dressing: Performed;Modified independent Where Assessed - Lower Body Dressing: Supported sit to Pharmacist, hospital: Simulated;Modified independent Toilet Transfer Method: Sit to stand Toilet Transfer Equipment: Raised toilet seat with arms (or 3-in-1 over toilet) Transfers/Ambulation Related to ADLs: Pt ambulated ~100 ft S in room. Pt at  supervision level due to HR 130s with mobility only ADL Comments: Pt requesting to take a bath. Pt provided adl items at sink level and completed a bath Mod I with increased time.      OT Goals    Visit Information  Last OT Received On: 09/25/11 Assistance Needed: +1    Subjective Data  Subjective: "My husband helps me when I need it" Patient Stated Goal: to go back home   Prior Functioning  Vision/Perception  Home Living Lives With: Spouse Available Help at Discharge: Family Type of Home: House Home Access: Stairs to enter Secretary/administrator of Steps: 3 Entrance Stairs-Rails: None Home Layout: One level Bathroom Shower/Tub: Engineer, manufacturing systems: Standard Home Adaptive Equipment: Hand-held shower hose;Walker - rolling Prior Function Level of Independence: Needs assistance Needs Assistance: Bathing Bath: Supervision/set-up Able to Take Stairs?: Yes Driving:  (Not at present secondary to recent surgery. ) Vocation: Retired Musician: No difficulties Dominant Hand: Right      Cognition  Overall Cognitive Status: Appears within functional limits for tasks assessed/performed Arousal/Alertness: Awake/alert Orientation Level: Appears intact for tasks assessed Behavior During Session: [facility] for tasks performed    Extremity/Trunk Assessment Right Upper Extremity Assessment RUE ROM/Strength/Tone: Within functional levels RUE Coordination: WFL - gross/fine motor Left Upper Extremity Assessment LUE ROM/Strength/Tone: Within functional levels LUE Coordination: WFL - gross/fine motor Right Lower Extremity Assessment RLE ROM/Strength/Tone: WFL for tasks assessed RLE Coordination: WFL - gross/fine motor Left Lower Extremity Assessment LLE ROM/Strength/Tone: WFL for tasks assessed LLE Coordination: WFL - gross/fine motor Trunk Assessment Trunk Assessment: Other exceptions;Kyphotic Trunk Exceptions: Lateral curvature of spine.   Mobility Bed  Mobility Bed Mobility: Right Sidelying to Sit;Rolling Right;Sitting - Scoot to Edge of Bed Rolling Right: 6: Modified independent (Device/Increase  time);With rail Right Sidelying to Sit: 4: Min assist Sitting - Scoot to Edge of Bed: 6: Modified independent (Device/Increase time) Sit to Supine: 4: Min assist;HOB flat Details for Bed Mobility Assistance: Pt required (A) with BIL LE to return to supine. Pt reports husband (A)ing with BIL LE at baseline Transfers Sit to Stand: 6: Modified independent (Device/Increase time);From bed;From toilet Stand to Sit: 6: Modified independent (Device/Increase time)   Exercise    Balance Static Standing Balance Static Standing - Comment/# of Minutes: Stood at sink to brush teeth without difficulty and able to reach within back precautions for all needed items.   End of Session OT - End of Session Activity Tolerance: Patient tolerated treatment well (HR elevated to 130) Patient left: in bed;with call bell/phone within reach;with family/visitor present Nurse Communication: Mobility status  GO     Harrel Carina [facility] 09/25/2011, 10:33 AM Pager: [phone]

## 2011-09-25 NOTE — Consult Note (Signed)
Cardiology Consult Note   Patient ID: [name] MRN: [id number], DOB/AGE: 70/28/1942   Admit date: 09/24/2011 Date of Consult: 09/25/2011  Primary Physician: Beverley Fiedler, MD Primary Cardiologist: Marca Ancona, MD  Pt. Profile: [name] is a 71yo female with PMHx significant for HTN, GERD, migraines, obesity, recent lumbar laminectomy (09/03/11) and recent LLE DVT (on chronic Coumadin) who was admitted to [facility] on 09/24/11 for presyncope.   Reason for consult: evaluation/management of sinus tachycardia/presyncope  HPI:   The patient last saw Dr. Shirlee Latch in follow-up on 05/2011. She had endorsed chronic tachy-palpitations described as "racing heart." A 24 hr Holter monitor revealed PVCs, but no other evidence of arrhythmia. Lexiscan Myoview at this time was normal with some breast attenuation. Dr. Shirlee Latch found her to be fairly stable from a cardiac perspective and encouraged weight loss. Additionally, ACEi/HCTZ antihypertensive was increased.   She underwent lumbar laminectomy late last month. Shortly after, she reported left leg pain. D-dimer was ordered and found to be mildly elevated. On 09/09/11, LE venous duplex revealed a LLE DVT and started on chronic Coumadin.   She states that her "racing heart" dated back to when she last saw Dr. Shirlee Latch and lasted for a few minutes and go away. These episodes of tachy-palpitations are different in quality. She states these episodes occurred after her surgery. She reports that she will stand up, and her HR will increase considerably. She will become lightheaded, take some deep breaths and they would generally improve. She reports considerable sedentary lifestyle resting since her surgery. No lightheadedness particularly on showering. Her symptoms worsened. She will record her HR and BP, and noticed that her HR increased to 140 bpm.   She was admitted by the medicine service. Cardiac biomarkers were ordered and returned WNL  x 3. CXR was unremarkable. TSH WNL. U/A WNL. INR 3.10. CT-A without evidence of PE and no acute process. CT-head no acute intracranial abnormality and mild small vessel ischemic change is stable. Telemetry review reveals paroxysmal sinus tachycardia associated with ambulation. Orthostatic vital signs positive this morning. Follow-up orthostatics returned WNL this afternoon. EKG reveals both sinus tachycardia and NSR. No ST-T wave changes. MRI brain reveals no acute infarct, mild to moderate small vessel changes, no evidence of hemorrhage. Carotid dopplers and echo pending. She was hyponatremic initially on admission believed to represent dehydration, and has since resolved with IVF hydration. CBC WNL. Not anemic. No leukocytosis. Afebrile.   Problem List: Past Medical History  Diagnosis Date  . Obesity, unspecified   . Stricture and stenosis of esophagus   . Diverticulosis of colon (without mention of hemorrhage)   . Personal history of colonic polyps 05/31/2007    adenomatous polyp  . Esophageal reflux   . Unspecified gastritis and gastroduodenitis without mention of hemorrhage   . Hiatal hernia   . Scoliosis   . Hyperlipidemia   . Hypertension   . Osteoporosis   . Family history of malignant neoplasm of gastrointestinal tract   . Dysrhythmia     notes in epic  . DVT of leg (deep venous thrombosis) 09/2011    LLE  . Pneumonia     "3 times at least" (09/24/11)  . Migraines 09/24/11    "often recently;  more controlled now"  . Rheumatoid arthritis   . Arthritis     "got alot; especially in my spine"  . Fibromyalgia     "dx'd w/it when it first came out"  . Chronic back pain     "  all of it"    Past Surgical History  Procedure Date  . Sigmoid resection / rectopexy 09/1999    Dr Earlene Plater  . Salivary gland surgery ~ 1980    1 removed  . Shoulder surgery 2008    right shoulder and collar bone  . Colonoscopy 2009  . Upper gastrointestinal endoscopy   . Breast surgery     cyst lanced and  drainded  . Tumor removal 1960's    left leg  . Lumbar laminectomy/decompression microdiscectomy 09/03/2011    Procedure: LUMBAR LAMINECTOMY/DECOMPRESSION MICRODISCECTOMY 2 LEVELS;  Surgeon: Mariam Dollar, MD;  Location: [facility];  Service: Neurosurgery;  Laterality: Left;  Left Lumbar Three-Four, Lumbar Four-Five Decompressive Laminectomy  . Exploratory laparotomy 04/2000    "for ruptured colon"  . Esophageal dilation     "several times"  . Trigger finger release 2004    left thumb  . Breast cyst incision and drainage 2012    left; "golf-ball sized"  . Back surgery   . Hematoma evacuation 09/2011    LLE  . Colon surgery      Allergies: No Known Allergies  Home Medications: Prior to Admission medications   Medication Sig Start Date End Date Taking? Authorizing Provider  aspirin 81 MG tablet Take 81 mg by mouth daily as needed.   Yes Historical Provider, MD  atorvastatin (LIPITOR) 20 MG tablet Take 20 mg by mouth daily.     Yes Historical Provider, MD  Cyanocobalamin (VITAMIN B-12 IJ) Inject as directed every 30 (thirty) days.   Yes Historical Provider, MD  cyclobenzaprine (FLEXERIL) 10 MG tablet Take 10 mg by mouth 3 (three) times daily as needed. Muscle spasms   Yes Historical Provider, MD  diltiazem (DILACOR XR) 240 MG 24 hr capsule Take 240 mg by mouth daily.     Yes Historical Provider, MD  esomeprazole (NEXIUM) 40 MG capsule Take 1 capsule (40 mg total) by mouth 2 (two) times daily. 02/06/11  Yes Mardella Layman, MD  lisinopril-hydrochlorothiazide (PRINZIDE,ZESTORETIC) 20-25 MG per tablet Take 1 tablet by mouth daily.     Yes Historical Provider, MD  metoCLOPramide (REGLAN) 10 MG tablet Take 10 mg by mouth 2 (two) times daily as needed. For migraine headache   Yes Historical Provider, MD  promethazine (PHENERGAN) 25 MG suppository Place 25 mg rectally every 6 (six) hours as needed.     Yes Historical Provider, MD  promethazine (PHENERGAN) 25 MG tablet Take 25 mg by mouth 3  (three) times daily as needed.     Yes Historical Provider, MD  warfarin (COUMADIN) 5 MG tablet Take 5 mg by mouth daily.   Yes Historical Provider, MD    Inpatient Medications:     . atorvastatin  20 mg Oral Daily  . diltiazem  240 mg Oral Daily  . docusate sodium  100 mg Oral BID  . pantoprazole  40 mg Oral BID AC  . warfarin  2.5 mg Oral Once  . Warfarin - Pharmacist Dosing Inpatient   Does not apply q1800  . DISCONTD: diltiazem  240 mg Oral Daily   Prescriptions prior to admission  Medication Sig Dispense Refill  . aspirin 81 MG tablet Take 81 mg by mouth daily as needed.      [name] atorvastatin (LIPITOR) 20 MG tablet Take 20 mg by mouth daily.        . Cyanocobalamin (VITAMIN B-12 IJ) Inject as directed every 30 (thirty) days.      . cyclobenzaprine (FLEXERIL) 10  MG tablet Take 10 mg by mouth 3 (three) times daily as needed. Muscle spasms      . diltiazem (DILACOR XR) 240 MG 24 hr capsule Take 240 mg by mouth daily.        [name] esomeprazole (NEXIUM) 40 MG capsule Take 1 capsule (40 mg total) by mouth 2 (two) times daily.  180 capsule  3  . lisinopril-hydrochlorothiazide (PRINZIDE,ZESTORETIC) 20-25 MG per tablet Take 1 tablet by mouth daily.        . metoCLOPramide (REGLAN) 10 MG tablet Take 10 mg by mouth 2 (two) times daily as needed. For migraine headache      . promethazine (PHENERGAN) 25 MG suppository Place 25 mg rectally every 6 (six) hours as needed.        . promethazine (PHENERGAN) 25 MG tablet Take 25 mg by mouth 3 (three) times daily as needed.        . warfarin (COUMADIN) 5 MG tablet Take 5 mg by mouth daily.        Family History  Problem Relation Age of Onset  . Pancreatic cancer Sister   . Stroke Mother   . Hypertension Mother   . Stroke Father   . Hypertension Father   . Diabetes Father   . Diabetes Sister     x 2     History   Social History  . Marital Status: Married    Spouse Name: N/A    Number of Children: 1  . Years of Education: N/A    Occupational History  .     Social History Main Topics  . Smoking status: Never Smoker   . Smokeless tobacco: Never Used  . Alcohol Use: No  . Drug Use: No  . Sexually Active: Not Currently    Birth Control/ Protection: Post-menopausal   Other Topics Concern  . Not on file   Social History Narrative  . No narrative on file     Review of Systems: General: negative for chills, fever, night sweats or weight changes.  Cardiovascular: positive dyspnea on exertion, shortness of breath and palpitations, negative for chest pain, edema, orthopnea, paroxysmal nocturnal dyspnea Dermatological: negative for rash Respiratory: negative for cough or wheezing Urologic: negative for hematuria Abdominal: positive for nausea, negative for vomiting, diarrhea, bright red blood per rectum, melena, or hematemesis Neurologic: positive for visual changes, presyncope, and dizziness All other systems reviewed and are otherwise negative except as noted above.  Physical Exam: Blood pressure 134/84, pulse 122, temperature 98.2 F (36.8 C), temperature source Oral, resp. rate 20, height 5' (1.524 m), weight 81.194 kg (179 lb), SpO2 98.00%.    General: Elderly, well developed, well nourished, in no acute distress. Head: Normocephalic, atraumatic, sclera non-icteric, no xanthomas, nares are without discharge.  Neck: Negative for carotid bruits. JVD not elevated. Lungs: Clear bilaterally to auscultation without wheezes, rales, or rhonchi. Breathing is unlabored. Heart: RRR with S1 S2. No murmurs, rubs, or gallops appreciated. Abdomen: Soft, non-tender, non-distended with normoactive bowel sounds. No hepatomegaly. No rebound/guarding. No obvious abdominal masses. Msk:  Strength and tone appears normal for age. Extremities: No clubbing, cyanosis or edema.  Distal pedal pulses are 2+ and equal bilaterally. Neuro: Alert and oriented X 3. Moves all extremities spontaneously. Psych:  Responds to questions  appropriately with a normal affect.  Labs: Recent Labs  Basename 09/25/11 0225 09/24/11 1215   WBC 8.2 7.5   HGB 13.1 14.6   HCT 38.9 41.8   MCV 83.1 82.1   PLT 379  439*   Lab 09/25/11 0225 09/24/11 1215  NA 135 131*  K 3.8 3.9  CL 99 94*  CO2 24 22  BUN 16 13  CREATININE 0.92 0.88  CALCIUM 9.0 9.7  PROT 6.3 --  BILITOT 0.4 --  ALKPHOS 62 --  ALT 13 --  AST 11 --  AMYLASE -- --  LIPASE -- --  GLUCOSE 117* 106*   Recent Labs  Basename 09/25/11 0241 09/24/11 1859   CKTOTAL 45 52   CKMB 1.4 1.5   CKMBINDEX -- --   TROPONINI <0.30 <0.30   Basename 09/24/11 1859  TSH 2.234  T4TOTAL --  T3FREE --  THYROIDAB --    Radiology/Studies: Dg Chest 2 View  09/24/2011  *RADIOLOGY REPORT*  Clinical Data: Tachycardia, dizziness, shortness of breath.  CHEST - 2 VIEW  Comparison: 09/02/2011  Findings: Heart and mediastinal contours are within normal limits. No focal opacities or effusions.  No acute bony abnormality.  IMPRESSION: No active cardiopulmonary disease.  Original Report Authenticated By: Cyndie Chime, M.D.   Dg Chest 2 View  09/02/2011  *RADIOLOGY REPORT*  Clinical Data: Preop for lumbar laminectomy.  Hiatal hernia. Hypertension.  CHEST - 2 VIEW  Comparison: 06/05/2007  Findings: Lateral view degraded by patient arm position.  Moderate S-shaped thoracolumbar spine curvature. Midline trachea. Borderline cardiomegaly.     Mediastinal contours otherwise within normal limits.  No pleural effusion or pneumothorax.  Clear lungs.  IMPRESSION: No acute cardiopulmonary disease.  Original Report Authenticated By: Consuello Bossier, M.D.   Dg Lumbar Spine 2-3 Views  09/03/2011  *RADIOLOGY REPORT*  Clinical Data: Back pain  LUMBAR SPINE - 2-3 VIEW  Comparison: 08/08/2011  Findings: Film #1 demonstrates a needle from a posterior approach directed most closely toward the L3 vertebral body.  Film #2 demonstrates an angled probe from posterior approach directed most closely toward the L2-3  interspace.  IMPRESSION: As above.  Original Report Authenticated By: Elsie Stain, M.D.   Ct Head Wo Contrast  09/24/2011  *RADIOLOGY REPORT*  Clinical Data: Dizziness, the patient is on blood thinners  CT HEAD WITHOUT CONTRAST  Technique:  Contiguous axial images were obtained from the base of the skull through the vertex without contrast.  Comparison: CT brain of 06/05/2007  Findings: The ventricular system is stable in size and configuration, and the septum is in a normal midline position.  The fourth ventricle and basilar cisterns appear stable.  Mild small vessel ischemic change is noted in the periventricular white matter.  No acute hemorrhage, mass lesion, or infarction is seen. On bone window images, no calvarial abnormality is seen.  IMPRESSION: No acute intracranial abnormality.  Mild small vessel ischemic change is stable.  Original Report Authenticated By: Juline Patch, M.D.   Ct Angio Chest W/cm &/or Wo Cm  09/24/2011  *RADIOLOGY REPORT*  Clinical Data: Tachycardia, shortness of breath.  DVT.  CT ANGIOGRAPHY CHEST  Technique:  Multidetector CT imaging of the chest using the standard protocol during bolus administration of intravenous contrast. Multiplanar reconstructed images including MIPs were obtained and reviewed to evaluate the vascular anatomy.  Contrast: OMNIPAQUE IOHEXOL 350 MG/ML SOLN  Comparison: 06/05/2007  Findings: No filling defects in the pulmonary arteries to suggest pulmonary emboli.  Heart is upper limits normal in size.  Aorta is normal caliber. No mediastinal, hilar, or axillary adenopathy. Visualized thyroid and chest wall soft tissues unremarkable. Imaging into the upper abdomen shows no acute findings.  Small gallstone noted within the gallbladder.  Minimal lingular and left base atelectasis or scarring.  Right lung is clear.  No effusions.  No acute bony abnormality.  Mild degenerative changes in the thoracic spine.  IMPRESSION: No evidence of pulmonary embolus.   No acute process.  Cholelithiasis.  Original Report Authenticated By: Cyndie Chime, M.D.   Mr Brain Wo Contrast  09/25/2011  *RADIOLOGY REPORT*  Clinical Data: Dizziness.  Elevated heart rate.  2 weeks postop from back surgery.  MRI HEAD WITHOUT CONTRAST  Technique:  Multiplanar, multiecho pulse sequences of the brain and surrounding structures were obtained according to standard protocol without intravenous contrast.  Comparison: 09/24/2011 head CT.  No comparison brain MR.  Findings: Artifact left cerebellum.  Small artifact right pons. Overall, no acute infarct noted.  Remote small infarct mid right corona radiata.  Mild to moderate small vessel disease type changes.  No intracranial hemorrhage.  No intracranial mass lesion detected on this unenhanced exam.  Major intracranial vascular structures are patent.  Mild global atrophy without hydrocephalus.  Exophthalmos.  Minimal paranasal sinus mucosal thickening.  Partially empty sella.  IMPRESSION: No acute infarct.  Mild to moderate small vessel disease type changes.  Please see above.  Original Report Authenticated By: Fuller Canada, M.D.   EKG: NSR, 88 bpm, no ST-T wave changes  ASSESSMENT:   1. Presyncope 2. Sinus tachycardia 3. Orthostatic hypotension 4. DVT 5. HTN 6. GERD 7. tachypalpitation syndrome most consistent with SVT  DISCUSSION/PLAN:  Ms. Game' work-up has been largely unremarkable as noted in the HPI. From a cardiac perspective, orthostatic vital signs were performed this AM, and returned positive. Will repeat. This is evident despite IVF hydration. She underwent 48-hour Holter monitoring earlier this year revealing occasional PVCs. Lexiscan Myoview in 05/2011 was normal. Cardiac biomarkers this admission WNL. No evidence of ischemia on EKG. No PE on CT. CT/MRI head without evidence of stroke. No evidence of anemia or leukocytosis. CXR and u/a clear. Afebrile. Hyponatremia, believed to be secondary to dehydration, has  resolved with hydration. She may require compression stockings, decrease or discontinuation of HCTZ, increased fluid and salt intake with BP monitoring and consideration of volume expanders (midodrine and fludrocortisone) in the future should the aforementioned methods be insufficient. Would add propanolol for tachycardia and HTN. Will review echo. Carotid dopplers pending. Continue holding Coumadin per pharmacy recommendations as INR elevated.    Signed, R. Hurman Horn, PA-C 09/25/2011, 9:51 AM  72year-old woman who recently underwent lumbar laminectomy subsequently she has developed postural tachycardia and orthostatic intolerance. She's had some lightheadedness with this and some hypertension but primarily she has had tachycardia. She has a history of hypertension and has been on both diuretics and calcium blockers. Her postoperative course has also been complicated by DVT but no evidence of pulmonary embolism. She's been quite ill and has spent a fair amount of time in bed but she says not more than about half of her waking hours were spent in bed/recumbent chair.  She's had no significant problems with constipation; she has had urinary frequency and urgency. There has been no dry mouth or dry eyes.  She also has a history of abrupt onset offset tachypalpitations which go back about 5 or 6 years. Holter monitoring in February demonstrated reasonable heart rate excursion with a maximum heart rate of 105.  She is manifesting autonomic insufficiency with postural tachycardia and orthostatic hypotension is fairly evident. The question is why has it occurred postoperatively. We will check with neurosurgery; data review from 1990s describes autonomic  insufficiency as occurring in as many as 20% of patients with lumbar laminectomy. We have not been able to find more recent data regarding this. Furthermore, her DVT could potentially be having an impact on venous return although her swelling is not so great  as to suggest this. Base of the above therefore we will recommend symptomatic therapy #1-out of bed to chair as much as possible #2 discontinue HCTZ and diltiazem #3 #3 low-dose beta blockers #4 await echocardiogram to presume that this will be unchanged  #5 anticipate early discharge    Sherryl Manges, MD 09/25/2011 1:06 PM

## 2011-09-25 NOTE — Evaluation (Signed)
Physical Therapy Evaluation Patient Details Name: [name] MRN: [id number] DOB: 10/08/1940 Today's Date: 09/25/2011 Time: 3086-5784 PT Time Calculation (min): 22 min  PT Assessment / Plan / Recommendation Clinical Impression  Patient admitted with near syncope. She presents today S/P recent back surgery with general lack of energy and is easily fatigued. We will follow acutely to ensure that she can return home with decreased burden of care at discharge.     PT Assessment  Patient needs continued PT services    Follow Up Recommendations  No PT follow up    Barriers to Discharge  None      Equipment Recommendations  None recommended by PT    Recommendations for Other Services  None  Frequency Min 3X/week    Precautions / Restrictions Precautions Precautions: Back;Fall Precaution Comments: Reviewed back precautions with patient - she is S/P lumbar surgery 3 weeks ago but could not recall her back precautions. She also did not have brace with her but states that she only needs to wear when she is "up for long periods". Grandson called while in room and patient asked to bring brace in.  Restrictions Weight Bearing Restrictions: No   Pertinent Vitals/Pain HR 124 with gait. No pain      Mobility  Bed Mobility Bed Mobility: Right Sidelying to Sit;Rolling Right;Sitting - Scoot to Edge of Bed Rolling Right: 6: Modified independent (Device/Increase time);With rail Right Sidelying to Sit: 4: Min assist Sitting - Scoot to Edge of Bed: 6: Modified independent (Device/Increase time) Details for Bed Mobility Assistance: Difficulty raising trunk. Verbal cues for sequencing secondary to recent back surgery.  Transfers Transfers: Sit to Stand;Stand to Sit Sit to Stand: 6: Modified independent (Device/Increase time);From bed;From toilet Stand to Sit: To toilet;To bed;6: Modified independent (Device/Increase time) Ambulation/Gait Ambulation/Gait Assistance: 4: Min guard Ambulation  Distance (Feet): 120 Feet Assistive device: None Ambulation/Gait Assistance Details: Uses IV pole for support. General lack of energy. No evidence of imbalance. HR 124 with gait.  Gait Pattern: Step-through pattern;Decreased stride length;Trunk flexed     PT Diagnosis: Difficulty walking  PT Problem List: Decreased activity tolerance;Decreased mobility;Decreased knowledge of precautions PT Treatment Interventions: Gait training;Stair training;Therapeutic activities;Patient/family education   PT Goals Acute Rehab PT Goals PT Goal Formulation: With patient Time For Goal Achievement: 10/02/11 Potential to Achieve Goals: Good Pt will go Supine/Side to Sit: with modified independence PT Goal: Supine/Side to Sit - Progress: Goal set today Pt will go Sit to Supine/Side: with modified independence PT Goal: Sit to Supine/Side - Progress: Goal set today Pt will Ambulate: 51 - 150 feet;with modified independence PT Goal: Ambulate - Progress: Goal set today Pt will Go Up / Down Stairs: 3-5 stairs;with min assist PT Goal: Up/Down Stairs - Progress: Goal set today Additional Goals Additional Goal #1: Patient will recall back precautions and demonstrate adherence to in functional activity.  PT Goal: Additional Goal #1 - Progress: Goal set today  Visit Information  Last PT Received On: 09/25/11 Assistance Needed: +1    Subjective Data  Subjective: Patient reports that she recently had back surgery and so has been limiting her activities at home Patient Stated Goal: Home and feel better   Prior Functioning  Home Living Lives With: Spouse Available Help at Discharge: Family Type of Home: House Home Access: Stairs to enter Secretary/administrator of Steps: 3 Entrance Stairs-Rails: None Home Layout: One level Bathroom Shower/Tub: Engineer, manufacturing systems: Standard Home Adaptive Equipment: Hand-held shower hose;Walker - rolling Prior Function Level of  Independence: Needs  assistance Needs Assistance: Bathing Bath: Supervision/set-up Able to Take Stairs?: Yes (with spouse assistance) Driving:  (Not at present secondary to recent surgery. ) Vocation: Retired Musician: No difficulties    Cognition  Overall Cognitive Status: Appears within functional limits for tasks assessed/performed Arousal/Alertness: Awake/alert Orientation Level: Appears intact for tasks assessed Behavior During Session: [facility] for tasks performed    Extremity/Trunk Assessment Right Lower Extremity Assessment RLE ROM/Strength/Tone: [facility] for tasks assessed RLE Coordination: WFL - gross/fine motor Left Lower Extremity Assessment LLE ROM/Strength/Tone: WFL for tasks assessed LLE Coordination: WFL - gross/fine motor Trunk Assessment Trunk Assessment: Other exceptions;Kyphotic Trunk Exceptions: Lateral curvature of spine.   Balance Static Standing Balance Static Standing - Comment/# of Minutes: Stood at sink to brush teeth without difficulty and able to reach within back precautions for all needed items.   End of Session PT - End of Session Activity Tolerance: Patient limited by fatigue Patient left: in bed;with call bell/phone within reach (OT in to see patient) Nurse Communication: Mobility status  GP Functional Assessment Tool Used: Clinical reasoning/judgement Functional Limitation: Mobility: Walking and moving around Mobility: Walking and Moving Around Current Status ([id number]): At least 1 percent but less than 20 percent impaired, limited or restricted Mobility: Walking and Moving Around Goal Status [phone]): At least 1 percent but less than 20 percent impaired, limited or restricted   Edwyna Perfect, PT  Pager [phone]  09/25/2011, 9:39 AM

## 2011-09-25 NOTE — Progress Notes (Signed)
ANTICOAGULATION CONSULT NOTE - Follow Up Consult  Pharmacy Consult for Cuomadin Indication: recent DVT  No Known Allergies  Patient Measurements: Height: 5' (152.4 cm) Weight: 179 lb (81.194 kg) IBW/kg (Calculated) : 45.5    Vital Signs: Temp: 98.2 F (36.8 C) (07/18 0500) Temp src: Oral (07/18 0500) BP: 108/78 mmHg (07/18 0507) Pulse Rate: 122  (07/18 0507)  Labs:  Basename 09/25/11 0241 09/25/11 0225 09/24/11 1859 09/24/11 1215  HGB -- 13.1 -- 14.6  HCT -- 38.9 -- 41.8  PLT -- 379 -- 439*  APTT -- -- -- --  LABPROT -- 36.9* -- 32.4*  INR -- 3.66* -- 3.10*  HEPARINUNFRC -- -- -- --  CREATININE -- 0.92 -- 0.88  CKTOTAL 45 -- 52 --  CKMB 1.4 -- 1.5 --  TROPONINI <0.30 -- <0.30 --    Estimated Creatinine Clearance: 53.7 ml/min (by C-G formula based on Cr of 0.92).   Assessment: Patient is a 71 y.o F on coumadin PTA for recent DVT.  INR is supratherapeutic at 3.66.  No bleeding noted.  Goal of Therapy:  INR 2-3   Plan:  1) hold coumadin today  [name] P 09/25/2011,9:19 AM

## 2011-09-26 DIAGNOSIS — R55 Syncope and collapse: Secondary | ICD-10-CM

## 2011-09-26 DIAGNOSIS — I498 Other specified cardiac arrhythmias: Secondary | ICD-10-CM

## 2011-09-26 DIAGNOSIS — I82409 Acute embolism and thrombosis of unspecified deep veins of unspecified lower extremity: Secondary | ICD-10-CM

## 2011-09-26 DIAGNOSIS — I951 Orthostatic hypotension: Secondary | ICD-10-CM

## 2011-09-26 LAB — COMPREHENSIVE METABOLIC PANEL
Albumin: 3.3 g/dL — ABNORMAL LOW (ref 3.5–5.2)
Alkaline Phosphatase: 57 U/L (ref 39–117)
BUN: 13 mg/dL (ref 6–23)
Calcium: 9 mg/dL (ref 8.4–10.5)
Creatinine, Ser: 0.86 mg/dL (ref 0.50–1.10)
GFR calc Af Amer: 78 mL/min — ABNORMAL LOW (ref >90)
Glucose, Bld: 94 mg/dL (ref 70–99)
Potassium: 3.8 mEq/L (ref 3.5–5.1)
Total Protein: 6.1 g/dL (ref 6.0–8.3)

## 2011-09-26 LAB — CBC
HCT: 37.5 % (ref 36.0–46.0)
Hemoglobin: 12.5 g/dL (ref 12.0–15.0)
MCH: 28.2 pg (ref 26.0–34.0)
MCHC: 33.3 g/dL (ref 30.0–36.0)
MCV: 84.5 fL (ref 78.0–100.0)
RDW: 13.7 % (ref 11.5–15.5)

## 2011-09-26 MED ORDER — ATORVASTATIN CALCIUM 20 MG PO TABS
20.0000 mg | ORAL_TABLET | Freq: Every day | ORAL | Status: DC
  Filled 2011-09-26: qty 1

## 2011-09-26 NOTE — Progress Notes (Signed)
ANTICOAGULATION CONSULT NOTE - Follow Up Consult  Pharmacy Consult for Coumadin Indication: recent DVT  No Known Allergies  Patient Measurements: Height: 5' (152.4 cm) Weight: 179 lb (81.194 kg) IBW/kg (Calculated) : 45.5   Vital Signs: Temp: 98.2 F (36.8 C) (07/19 0500) Temp src: Oral (07/19 0500) BP: 105/72 mmHg (07/19 0507) Pulse Rate: 73  (07/19 0507)  Labs:  Basename 09/26/11 0545 09/25/11 1200 09/25/11 0241 09/25/11 0225 09/24/11 1859 09/24/11 1215  HGB 12.5 -- -- 13.1 -- --  HCT 37.5 -- -- 38.9 -- 41.8  PLT 401* -- -- 379 -- 439*  APTT -- -- -- -- -- --  LABPROT 37.8* -- -- 36.9* -- 32.4*  INR 3.77* -- -- 3.66* -- 3.10*  HEPARINUNFRC -- -- -- -- -- --  CREATININE 0.86 -- -- 0.92 -- 0.88  CKTOTAL -- 57 45 -- 52 --  CKMB -- 1.8 1.4 -- 1.5 --  TROPONINI -- <0.30 <0.30 -- <0.30 --    Estimated Creatinine Clearance: 57.5 ml/min (by C-G formula based on Cr of 0.86).  Assessment: Patient is a 71 y.o F on coumadin for recent DVT.  INR continue to trend up to 3.77 despite dose held last night.  No bleeding noted.   Goal of Therapy:  INR 2-3   Plan:  1) continue to hold coumadin today  [name] P 09/26/2011,8:27 AM

## 2011-09-26 NOTE — Progress Notes (Signed)
Physical Therapy Treatment Patient Details Name: [name] MRN: [id number] DOB: [birth year] Today's Date: 09/26/2011 Time: 4098-1191 PT Time Calculation (min): 19 min  PT Assessment / Plan / Recommendation Comments on Treatment Session  Patient without tachycardia today and so she has improved functional activity tolerance.     Follow Up Recommendations  No PT follow up    Barriers to Discharge  None      Equipment Recommendations  None recommended by PT    Recommendations for Other Services  None  Frequency Min 3X/week   Plan Discharge plan remains appropriate;Frequency remains appropriate    Precautions / Restrictions Precautions Precautions: Back;Fall Precaution Comments: Reviewed back precautions- verbalizes 2/3 but compliant with good posture in functional mobility. Indep. donning and doffing brace.   Pertinent Vitals/Pain HR in 90's with gait.    Mobility  Bed Mobility Bed Mobility: Sit to Sidelying Right Rolling Right: 6: Modified independent (Device/Increase time) Right Sidelying to Sit: 6: Modified independent (Device/Increase time) Sitting - Scoot to Edge of Bed: 6: Modified independent (Device/Increase time) Sit to Sidelying Right: 4: Min assist Details for Bed Mobility Assistance: Patient is unable to raise legs while lowering to her side - she states that he husband has been helping her at home.  Transfers Sit to Stand: 6: Modified independent (Device/Increase time);From bed;From toilet Stand to Sit: To toilet;To bed;6: Modified independent (Device/Increase time) Ambulation/Gait Ambulation/Gait Assistance: 5: Supervision Ambulation Distance (Feet): 200 Feet Assistive device: None Ambulation/Gait Assistance Details: Uses IV pole for support. HR 90's at most resulting in improved functional activity tolerance. Discussed use of walker at home since she always has a tendency to reach for support.  Gait Pattern: Step-through pattern;Decreased stride length    PT Goals Acute Rehab PT Goals PT Goal: Supine/Side to Sit - Progress: Met PT Goal: Sit to Supine/Side - Progress: Progressing toward goal PT Goal: Ambulate - Progress: Progressing toward goal  Visit Information  Last PT Received On: 09/26/11 Assistance Needed: +1    Subjective Data  Subjective: Patient reports more energy today. Patient Stated Goal: Home tomorrow   Cognition  Overall Cognitive Status: Appears within functional limits for tasks assessed/performed Arousal/Alertness: Awake/alert Orientation Level: Appears intact for tasks assessed Behavior During Session: [facility] for tasks performed    Balance   No evidence of imbalance  End of Session PT - End of Session Equipment Utilized During Treatment: Gait belt Activity Tolerance: Patient tolerated treatment well Patient left: in bed;with call bell/phone within reach;with bed alarm set Nurse Communication: Mobility status       Edwyna Perfect, PT  Pager [phone]  09/26/2011, 12:19 PM

## 2011-09-26 NOTE — Progress Notes (Signed)
Utilization Review Completed.Madlynn Lundeen T7/19/2013   

## 2011-09-26 NOTE — Progress Notes (Signed)
PCP: Beverley Fiedler, MD  Brief HPI:  This is a 71 year old, white female with a past medical history of migraine headaches and Hypertension. She recently underwent lumbar laminectomy for spinal stenosis on September 03, 2011. Subsequent to this surgical procedure she started having left leg pain, and was found to have a deep venous thrombosis. She was started on warfarin earlier this month. She tells me that ever since her surgery she's had difficulty with dizziness. Whenever she would stand up she would feel that her heart was racing. She would feel dizzy, and have a funny feeling in her head. Denies any sensation of spinning of the room around her. Had some nausea, poor appetite for the last couple weeks. EMT was called yesterday and they checked her white of signs in everything was stable. They suggested that she may have to hydrate herself and so, she drank Gatorade and fluids. However, her symptoms persisted, and, so, she decided to seek attention. She's had occasional episodes of right hand numbness with position, which goes away when she extends her elbow. She also has history of migraine headaches, but denies any headache at this time. She denies any syncopal episodes. No history of fever. She's had some chills. Denies any sick contacts. No diarrhea or vomiting. No chest pains. Some shortness of breath with exertion which is chronic. She tells me that she has had these symptoms evaluated many times over the last many years and most recently by Dr. Shirlee Latch in February of 2013 . She underwent a stress test earlier this year, which was unremarkable. EF was normal. No medication changes were made. She's had the urine test for catecholamines, which also been unremarkable according to the patient. Tells me that her back and her legs are feeling much better after the surgery and hasn't had any complications.  Past medical history:  Past Medical History  Diagnosis Date  . Obesity, unspecified   . Stricture  and stenosis of esophagus   . Diverticulosis of colon (without mention of hemorrhage)   . Personal history of colonic polyps 05/31/2007    adenomatous polyp  . Esophageal reflux   . Unspecified gastritis and gastroduodenitis without mention of hemorrhage   . Hiatal hernia   . Scoliosis   . Hyperlipidemia   . Hypertension   . Osteoporosis   . Family history of malignant neoplasm of gastrointestinal tract   . Dysrhythmia     notes in epic  . DVT of leg (deep venous thrombosis) 09/2011    LLE  . Pneumonia     "3 times at least" (09/24/11)  . Migraines 09/24/11    "often recently;  more controlled now"  . Rheumatoid arthritis   . Arthritis     "got alot; especially in my spine"  . Fibromyalgia     "dx'd w/it when it first came out"  . Chronic back pain     "all of it"    Consultants: Will consult [facility] cardiology  Procedures: None so far  Subjective: Patient feels better. Has not noted nay fast heart rate since yesterday. Has been up to the bathroom. No nausea.   Objective: Vital signs in last 24 hours: Temp:  [98 F (36.7 C)-98.3 F (36.8 C)] 98.2 F (36.8 C) (07/19 0500) Pulse Rate:  [51-114] 73  (07/19 0507) Resp:  [16-18] 16  (07/19 0507) BP: (105-138)/(71-89) 105/72 mmHg (07/19 0507) SpO2:  [95 %-99 %] 98 % (07/19 0507) Weight change:  Last BM Date: 09/26/11  Intake/Output from previous day:  07/18 0701 - 07/19 0700 In: -  Out: 750 [Urine:750] Intake/Output this shift:    General appearance: alert, cooperative, appears stated age and moderately obese Head: Normocephalic, without obvious abnormality, atraumatic Resp: clear to auscultation bilaterally Cardio: regular rate and rhythm, S1, S2 normal, no murmur, click, rub or gallop GI: soft, non-tender; bowel sounds normal; no masses,  no organomegaly Extremities: Left leg more swollen compared to right. Pulses: 2+ and symmetric Neurologic: Grossly normal. No focal deficits.  Lab Results:  Basename 09/26/11  0545 09/25/11 0225  WBC 7.0 8.2  HGB 12.5 13.1  HCT 37.5 38.9  PLT 401* 379   BMET  Basename 09/26/11 0545 09/25/11 0225  NA 140 135  K 3.8 3.8  CL 104 99  CO2 24 24  GLUCOSE 94 117*  BUN 13 16  CREATININE 0.86 0.92  CALCIUM 9.0 9.0  ALT 11 13    Studies/Results: Dg Chest 2 View  09/24/2011  *RADIOLOGY REPORT*  Clinical Data: Tachycardia, dizziness, shortness of breath.  CHEST - 2 VIEW  Comparison: 09/02/2011  Findings: Heart and mediastinal contours are within normal limits. No focal opacities or effusions.  No acute bony abnormality.  IMPRESSION: No active cardiopulmonary disease.  Original Report Authenticated By: Cyndie Chime, M.D.   Ct Head Wo Contrast  09/24/2011  *RADIOLOGY REPORT*  Clinical Data: Dizziness, the patient is on blood thinners  CT HEAD WITHOUT CONTRAST  Technique:  Contiguous axial images were obtained from the base of the skull through the vertex without contrast.  Comparison: CT brain of 06/05/2007  Findings: The ventricular system is stable in size and configuration, and the septum is in a normal midline position.  The fourth ventricle and basilar cisterns appear stable.  Mild small vessel ischemic change is noted in the periventricular white matter.  No acute hemorrhage, mass lesion, or infarction is seen. On bone window images, no calvarial abnormality is seen.  IMPRESSION: No acute intracranial abnormality.  Mild small vessel ischemic change is stable.  Original Report Authenticated By: Juline Patch, M.D.   Ct Angio Chest W/cm &/or Wo Cm  09/24/2011  *RADIOLOGY REPORT*  Clinical Data: Tachycardia, shortness of breath.  DVT.  CT ANGIOGRAPHY CHEST  Technique:  Multidetector CT imaging of the chest using the standard protocol during bolus administration of intravenous contrast. Multiplanar reconstructed images including MIPs were obtained and reviewed to evaluate the vascular anatomy.  Contrast: OMNIPAQUE IOHEXOL 350 MG/ML SOLN  Comparison: 06/05/2007   Findings: No filling defects in the pulmonary arteries to suggest pulmonary emboli.  Heart is upper limits normal in size.  Aorta is normal caliber. No mediastinal, hilar, or axillary adenopathy. Visualized thyroid and chest wall soft tissues unremarkable. Imaging into the upper abdomen shows no acute findings.  Small gallstone noted within the gallbladder.  Minimal lingular and left base atelectasis or scarring.  Right lung is clear.  No effusions.  No acute bony abnormality.  Mild degenerative changes in the thoracic spine.  IMPRESSION: No evidence of pulmonary embolus.  No acute process.  Cholelithiasis.  Original Report Authenticated By: Cyndie Chime, M.D.   Mr Brain Wo Contrast  09/25/2011  *RADIOLOGY REPORT*  Clinical Data: Dizziness.  Elevated heart rate.  2 weeks postop from back surgery.  MRI HEAD WITHOUT CONTRAST  Technique:  Multiplanar, multiecho pulse sequences of the brain and surrounding structures were obtained according to standard protocol without intravenous contrast.  Comparison: 09/24/2011 head CT.  No comparison brain MR.  Findings: Artifact left cerebellum.  Small artifact right pons. Overall, no acute infarct noted.  Remote small infarct mid right corona radiata.  Mild to moderate small vessel disease type changes.  No intracranial hemorrhage.  No intracranial mass lesion detected on this unenhanced exam.  Major intracranial vascular structures are patent.  Mild global atrophy without hydrocephalus.  Exophthalmos.  Minimal paranasal sinus mucosal thickening.  Partially empty sella.  IMPRESSION: No acute infarct.  Mild to moderate small vessel disease type changes.  Please see above.  Original Report Authenticated By: Fuller Canada, M.D.   US Abdomen Complete  09/25/2011  *RADIOLOGY REPORT*  Clinical Data:  70 year old female with abdominal pain and nausea. History of lumbar surgery 3 weeks ago.  ABDOMINAL ULTRASOUND COMPLETE  Comparison:  05/12/2007 ultrasound  Findings:   Gallbladder:  A 5 mm gallbladder polyp is present.  There is no evidence of gallstones, gallbladder wall thickening, or pericholecystic fluid.  Common Bile Duct:  There is no evidence of intrahepatic or extrahepatic biliary dilation. The CBD measures 5.5 mm in greatest diameter.  Liver:  The liver is within normal limits in parenchymal echogenicity. No focal abnormalities are identified.  IVC:  Appears normal.  Pancreas:  Although the pancreas is difficult to visualize in its entirety, no focal pancreatic abnormality is identified.  Spleen:  Within normal limits in size and echotexture.  Right kidney:  The right kidney is normal in size and parenchymal echogenicity.  There is no evidence of solid mass, hydronephrosis or definite renal calculi.  The right kidney measures 9.2 cm.  Left kidney:  The left kidney is normal in size and parenchymal echogenicity.  There is no evidence of solid mass, hydronephrosis or definite renal calculi.   The left kidney measures 9.2 cm.  Abdominal Aorta:  No abdominal aortic aneurysm identified.  There is no evidence of ascites.  IMPRESSION: 5 mm gallbladder polyp, otherwise negative abdominal ultrasound.  Original Report Authenticated By: Rosendo Gros, M.D.    Medications:  Scheduled:    . atorvastatin  20 mg Oral Daily  . docusate sodium  100 mg Oral BID  . metoprolol tartrate  25 mg Oral BID  . pantoprazole  40 mg Oral BID AC  . Warfarin - Pharmacist Dosing Inpatient   Does not apply q1800  . DISCONTD: diltiazem  240 mg Oral Daily   Continuous:    . sodium chloride 75 mL/hr at 09/26/11 0820   WUJ:[id number], acetaminophen, albuterol, cyclobenzaprine, metoCLOPramide, ondansetron (ZOFRAN) IV, ondansetron, oxyCODONE  Assessment/Plan:  Principal Problem:  *Near syncope Active Problems:  GERD  HEADACHE, CHRONIC  Hypertension  Tachycardia, paroxysmal  DVT (deep venous thrombosis)  Hyponatremia  Nausea  Syncope due to orthostatic hypotension    Near  syncope with paroxysmal tachycardia, which is postural most of the time She was orthostatic yesterday. But not today. Metoprolol seems to be helping. Tele did not show further episodes of tachycardia. She also seems to be hydrated now. Cardiac enzymes are normal. No PE seen on CT. Appreciate cardiology input. TSH was normal. ECHO is unremarkable for valvular disease. UA was unremarkable. Carotid Dopplers are unremarkable.  History of recent deep venous thrombosis Continue with warfarin.  Nausea Improved. Possibly be related to above. Cholelithiasis was noted on CT. But patient does not have any abdominal pain. LFT's are normal. US abdomen was negative except for a 5 mm GB polyp. Continue PPI.  Recent Lumbar Laminectomy Stable with no neurological deficits. PT/OT  History of hypertension Now on metoprolol. Monitor blood pressures closely.  Hyponatremia Improved with hydration. Could have been low due to dehydration.   History of GERD Continue with proton pump inhibitors.   CODE STATUS She is a full code.   DVT prophylaxis She is already on warfarin.  Disposition: Monitor one more day. Have PT/OT re-evaluate. Possible dc in AM.   LOS: 2 days   [facility]  [facility] Pager [phone] 09/26/2011, 10:19 AM

## 2011-09-26 NOTE — Progress Notes (Signed)
Cardiology Progress Note Patient Name: [name] Date of Encounter: 09/26/2011, 8:03 AM     Subjective  Patient reports feeling significantly better today. She was able to walk to the bathroom without feeling poorly.   Objective   Telemetry: sinus rhythm, mainly 60-80s, some increased rates in the 100-110s  Medications: . atorvastatin  20 mg Oral Daily  . docusate sodium  100 mg Oral BID  . metoprolol tartrate  25 mg Oral BID  . pantoprazole  40 mg Oral BID AC  . Warfarin - Pharmacist Dosing Inpatient   Does not apply q1800  . DISCONTD: diltiazem  240 mg Oral Daily   . sodium chloride 75 mL/hr at 09/25/11 0951    Physical Exam: Temp:  [98 F (36.7 C)-98.3 F (36.8 C)] 98.2 F (36.8 C) (07/19 0500) Pulse Rate:  [51-136] 73  (07/19 0507) Resp:  [16-18] 16  (07/19 0507) BP: (105-138)/(71-89) 105/72 mmHg (07/19 0507) SpO2:  [95 %-99 %] 98 % (07/19 0507)  General: Pleasant elderly white female, in no acute distress. Head: Normocephalic, atraumatic, sclera non-icteric, nares are without discharge.  Neck: Supple. Negative for carotid bruits or JVD Lungs: Clear bilaterally to auscultation without wheezes, rales, or rhonchi. Breathing is unlabored. Heart: RRR S1 S2 without murmurs, rubs, or gallops.  Abdomen: Soft, non-tender, non-distended with normoactive bowel sounds. No rebound/guarding. No obvious abdominal masses. Msk:  Strength and tone appear normal for age. Extremities: Trace BLE edema, L>R. No clubbing or cyanosis. Distal pedal pulses are intact and equal bilaterally. Neuro: Alert and oriented X 3. Moves all extremities spontaneously. Psych:  Responds to questions appropriately with a normal affect.   Intake/Output Summary (Last 24 hours) at 09/26/11 0803 Last data filed at 09/25/11 1400  Gross per 24 hour  Intake      0 ml  Output    750 ml  Net   -750 ml    Labs:  [facility] 09/26/11 0545 09/25/11 0225  NA 140 135  K 3.8 3.8  CL 104 99  CO2 24  24  GLUCOSE 94 117*  BUN 13 16  CREATININE 0.86 0.92  CALCIUM 9.0 9.0   Basename 09/26/11 0545 09/25/11 0225  AST 14 11  ALT 11 13  ALKPHOS 57 62  BILITOT 0.4 0.4  PROT 6.1 6.3  ALBUMIN 3.3* 3.4*   Basename 09/26/11 0545 09/25/11 0225  WBC 7.0 8.2  HGB 12.5 13.1  HCT 37.5 38.9  MCV 84.5 83.1  PLT 401* 379     09/26/2011 05:45  Prothrombin Time 37.8 (H)  INR 3.77 (H)   Basename 09/25/11 1200 09/25/11 0241 09/24/11 1859  CKTOTAL 57 45 52  CKMB 1.8 1.4 1.5  TROPONINI <0.30 <0.30 <0.30   Basename 09/24/11 1859  TSH 2.234   Radiology/Studies:   09/25/11 - 2D Echo Study Conclusions:  Left ventricle: The cavity size was normal. Gyasi Hazzard thickness was increased in a pattern of mild LVH. Systolic function was vigorous. The estimated ejection fraction was in the range of 65% to 70%. There was an increased relative contribution of atrial contraction to ventricular filling.   09/24/2011 - Chest 2 View Findings: Heart and mediastinal contours are within normal limits. No focal opacities or effusions.  No acute bony abnormality.  IMPRESSION: No active cardiopulmonary disease.   09/24/2011 - Ct Head Wo Contrast  Findings: The ventricular system is stable in size and configuration, and the septum is in a normal midline position.  The fourth ventricle and  basilar cisterns appear stable.  Mild small vessel ischemic change is noted in the periventricular white matter.  No acute hemorrhage, mass lesion, or infarction is seen. On bone window images, no calvarial abnormality is seen.  IMPRESSION: No acute intracranial abnormality.  Mild small vessel ischemic change is stable  09/24/2011  - Ct Angio Chest W/cm &/or Wo Cm Findings: No filling defects in the pulmonary arteries to suggest pulmonary emboli.  Heart is upper limits normal in size.  Aorta is normal caliber. No mediastinal, hilar, or axillary adenopathy. Visualized thyroid and chest Lezlie Ritchey soft tissues unremarkable. Imaging into the upper  abdomen shows no acute findings.  Small gallstone noted within the gallbladder.  Minimal lingular and left base atelectasis or scarring.  Right lung is clear.  No effusions.  No acute bony abnormality.  Mild degenerative changes in the thoracic spine.  IMPRESSION: No evidence of pulmonary embolus.  No acute process.  Cholelithiasis   09/25/2011 - Mr Brain Wo Contrast Findings: Artifact left cerebellum.  Small artifact right pons. Overall, no acute infarct noted.  Remote small infarct mid right corona radiata.  Mild to moderate small vessel disease type changes.  No intracranial hemorrhage.  No intracranial mass lesion detected on this unenhanced exam.  Major intracranial vascular structures are patent.  Mild global atrophy without hydrocephalus.  Exophthalmos.  Minimal paranasal sinus mucosal thickening.  Partially empty sella.  IMPRESSION: No acute infarct.  Mild to moderate small vessel disease type changes.      09/25/2011 - US Abdomen Complete  Findings:  Gallbladder:  A 5 mm gallbladder polyp is present.  There is no evidence of gallstones, gallbladder Collie Kittel thickening, or pericholecystic fluid.  Common Bile Duct:  There is no evidence of intrahepatic or extrahepatic biliary dilation. The CBD measures 5.5 mm in greatest diameter.  Liver:  The liver is within normal limits in parenchymal echogenicity. No focal abnormalities are identified.  IVC:  Appears normal.  Pancreas:  Although the pancreas is difficult to visualize in its entirety, no focal pancreatic abnormality is identified.  Spleen:  Within normal limits in size and echotexture.  Right kidney:  The right kidney is normal in size and parenchymal echogenicity.  There is no evidence of solid mass, hydronephrosis or definite renal calculi.  The right kidney measures 9.2 cm.  Left kidney:  The left kidney is normal in size and parenchymal echogenicity.  There is no evidence of solid mass, hydronephrosis or definite renal calculi.   The left kidney  measures 9.2 cm.  Abdominal Aorta:  No abdominal aortic aneurysm identified.  There is no evidence of ascites.  IMPRESSION: 5 mm gallbladder polyp, otherwise negative abdominal ultrasound.     Assessment and Plan  71 y.o. female w/ PMHx significant for HTN, GERD, migraines, obesity, recent lumbar laminectomy (09/03/11) and recent LLE DVT (on chronic Coumadin) who was admitted to [facility] on 09/24/11 for presyncope.  1. Sinus tachycardia 2. Orthostatic Hypotension 3. Presyncope 4. S/p lumbar laminectomy 09/03/11 w/ post op DVT (on chronic coumadin) 5. HTN  Rates appear improved since initiation of metoprolol yesterday. Rates have been < 90bpm since 2100 last night. No postural tachycardia this morning. HCTZ and diltiazem were dc'd yesterday. BP has remained stable. Will order TED hose for LE edema. INR remains supratherapeutic this am, coumadin on hold. Echo revealed nl LV systolic fxn EF 65-70%, no significant valvular abnls. Carotid doppler without significant ICA stenosis.  Signed, HOPE, JESSICA PA-C  Patient examined and agree.   Maisie Fus  Dwayn Moravek, MD 09/26/2011 10:18 AM

## 2011-09-27 LAB — PROTIME-INR
INR: 3.19 — ABNORMAL HIGH (ref 0.00–1.49)
Prothrombin Time: 33.2 seconds — ABNORMAL HIGH (ref 11.6–15.2)

## 2011-09-27 MED ORDER — WARFARIN SODIUM 5 MG PO TABS: 5.0000 mg | ORAL_TABLET | Freq: Every day | ORAL | Status: DC

## 2011-09-27 MED ORDER — METOPROLOL TARTRATE 25 MG PO TABS: 25.0000 mg | ORAL_TABLET | Freq: Two times a day (BID) | ORAL | Status: DC

## 2011-09-27 MED ORDER — WARFARIN SODIUM 5 MG PO TABS: ORAL_TABLET | ORAL | Status: DC

## 2011-09-27 NOTE — Discharge Summary (Signed)
Physician Discharge Summary  Patient ID: [name] MRN: [id number] DOB/AGE: 70-Oct-1942 70 y.o.  Admit date: 09/24/2011 Discharge date: 09/27/2011  PCP: Beverley Fiedler, MD  DISCHARGE DIAGNOSES:  Active Problems:  GERD  HEADACHE, CHRONIC  Hypertension  Tachycardia, paroxysmal  DVT (deep venous thrombosis)  Hyponatremia  Nausea   RECOMMENDATIONS TO PCP: 1. INR needs to be checked on Monday 7/22   DISCHARGE CONDITION: fair  INITIAL HISTORY: This is a 71 year old, white female with a past medical history of migraine headaches and Hypertension. She recently underwent lumbar laminectomy for spinal stenosis on September 03, 2011. Subsequent to this surgical procedure she started having left leg pain, and was found to have a deep venous thrombosis. She was started on warfarin earlier this month. She tells me that ever since her surgery she's had difficulty with dizziness. Whenever she would stand up she would feel that her heart was racing. She would feel dizzy, and have a funny feeling in her head. Denies any sensation of spinning of the room around her. Had some nausea, poor appetite for the last couple weeks. EMT was called the day prior to admission and they checked her white of signs in everything was stable. They suggested that she may have to hydrate herself and so, she drank Gatorade and fluids. However, her symptoms persisted, and, so, she decided to seek attention. She's had occasional episodes of right hand numbness with position, which goes away when she extends her elbow. She also has history of migraine headaches, but denied any headache at this time. She denied any syncopal episodes. No history of fever. She's had some chills. Denies any sick contacts. No diarrhea or vomiting. No chest pains. Some shortness of breath with exertion which is chronic. She tells me that she has had these symptoms evaluated many times over the last many years and most recently by Dr. Shirlee Latch in  February of 2013 . She underwent a stress test earlier this year, which was unremarkable. EF was normal. No medication changes were made. She's had the urine test for catecholamines, which also been unremarkable according to the patient. Tells me that her back and her legs are feeling much better after the surgery and hasn't had any complications.   HOSPITAL COURSE:   Near syncope with paroxysmal tachycardia, which is postural most of the time  She was orthostatic at the time of admission. Patient was instructed on getting up slowly from lying position. She was also started on Metoprolol by cardiology.  With this her symptoms have resolved. She has been able ambulate with no difficulty. Tele has not shown further episodes of tachycardia. Since it was felt she may have been dehydrated, she was given IV fluids. Cardiac enzymes were normal. No PE seen on CT. TSH was normal. ECHO was unremarkable for valvular disease. UA was unremarkable. Carotid Dopplers was unremarkable. Please see below.  History of recent deep venous thrombosis  Continue with warfarin. Since INR is still supratherapeutic we will ask her to hold her dose today. And have her take half a tablet tomorrow and have her get an INR checked on Monday.  Nausea  This has improved. It was possibly related to the tachycardia and orthostatic hypotension. Cholelithiasis was noted on CT. But patient did not have any abdominal pain. LFT's were normal. US abdomen was done which was negative except for a 5 mm GB polyp. Continue PPI.   Recent Lumbar Laminectomy  Stable with no neurological deficits. PT/OT.    History of hypertension  Now on metoprolol. Her other anti-hypertensives have been discontinued.   Hyponatremia  Improved with hydration. Could have been low due to dehydration.   History of GERD  Continue with proton pump inhibitors.   Patient is feeling better. She has ambulated today with no difficulty. She is stable for  discharge.   VASCULAR STUDIES  2D ECHOCARDIOGRAM Study Conclusions  Left ventricle: The cavity size was normal. Wall thickness was increased in a pattern of mild LVH. Systolic function was vigorous. The estimated ejection fraction was in the range of 65% to 70%. There was an increased relative contribution of atrial contraction to ventricular filling.  CAROTID DOPPLERS Summary: No significant ICA stenosis of the ICAs. No significant plaque noted bilaterally.    PERTINENT LABS:  INR is 3.19 today. TSH 2.23  IMAGING STUDIES Dg Chest 2 View  09/24/2011  *RADIOLOGY REPORT*  Clinical Data: Tachycardia, dizziness, shortness of breath.  CHEST - 2 VIEW  Comparison: 09/02/2011  Findings: Heart and mediastinal contours are within normal limits. No focal opacities or effusions.  No acute bony abnormality.  IMPRESSION: No active cardiopulmonary disease.  Original Report Authenticated By: Cyndie Chime, M.D.   Dg Chest 2 View  09/02/2011  *RADIOLOGY REPORT*  Clinical Data: Preop for lumbar laminectomy.  Hiatal hernia. Hypertension.  CHEST - 2 VIEW  Comparison: 06/05/2007  Findings: Lateral view degraded by patient arm position.  Moderate S-shaped thoracolumbar spine curvature. Midline trachea. Borderline cardiomegaly.     Mediastinal contours otherwise within normal limits.  No pleural effusion or pneumothorax.  Clear lungs.  IMPRESSION: No acute cardiopulmonary disease.  Original Report Authenticated By: Consuello Bossier, M.D.   Dg Lumbar Spine 2-3 Views  09/03/2011  *RADIOLOGY REPORT*  Clinical Data: Back pain  LUMBAR SPINE - 2-3 VIEW  Comparison: 08/08/2011  Findings: Film #1 demonstrates a needle from a posterior approach directed most closely toward the L3 vertebral body.  Film #2 demonstrates an angled probe from posterior approach directed most closely toward the L2-3 interspace.  IMPRESSION: As above.  Original Report Authenticated By: Elsie Stain, M.D.   Ct Head Wo Contrast  09/24/2011   *RADIOLOGY REPORT*  Clinical Data: Dizziness, the patient is on blood thinners  CT HEAD WITHOUT CONTRAST  Technique:  Contiguous axial images were obtained from the base of the skull through the vertex without contrast.  Comparison: CT brain of 06/05/2007  Findings: The ventricular system is stable in size and configuration, and the septum is in a normal midline position.  The fourth ventricle and basilar cisterns appear stable.  Mild small vessel ischemic change is noted in the periventricular white matter.  No acute hemorrhage, mass lesion, or infarction is seen. On bone window images, no calvarial abnormality is seen.  IMPRESSION: No acute intracranial abnormality.  Mild small vessel ischemic change is stable.  Original Report Authenticated By: Juline Patch, M.D.   Ct Angio Chest W/cm &/or Wo Cm  09/24/2011  *RADIOLOGY REPORT*  Clinical Data: Tachycardia, shortness of breath.  DVT.  CT ANGIOGRAPHY CHEST  Technique:  Multidetector CT imaging of the chest using the standard protocol during bolus administration of intravenous contrast. Multiplanar reconstructed images including MIPs were obtained and reviewed to evaluate the vascular anatomy.  Contrast: OMNIPAQUE IOHEXOL 350 MG/ML SOLN  Comparison: 06/05/2007  Findings: No filling defects in the pulmonary arteries to suggest pulmonary emboli.  Heart is upper limits normal in size.  Aorta is normal caliber. No mediastinal, hilar, or axillary adenopathy. Visualized thyroid and chest  wall soft tissues unremarkable. Imaging into the upper abdomen shows no acute findings.  Small gallstone noted within the gallbladder.  Minimal lingular and left base atelectasis or scarring.  Right lung is clear.  No effusions.  No acute bony abnormality.  Mild degenerative changes in the thoracic spine.  IMPRESSION: No evidence of pulmonary embolus.  No acute process.  Cholelithiasis.  Original Report Authenticated By: Cyndie Chime, M.D.   Mr Brain Wo Contrast  09/25/2011   *RADIOLOGY REPORT*  Clinical Data: Dizziness.  Elevated heart rate.  2 weeks postop from back surgery.  MRI HEAD WITHOUT CONTRAST  Technique:  Multiplanar, multiecho pulse sequences of the brain and surrounding structures were obtained according to standard protocol without intravenous contrast.  Comparison: 09/24/2011 head CT.  No comparison brain MR.  Findings: Artifact left cerebellum.  Small artifact right pons. Overall, no acute infarct noted.  Remote small infarct mid right corona radiata.  Mild to moderate small vessel disease type changes.  No intracranial hemorrhage.  No intracranial mass lesion detected on this unenhanced exam.  Major intracranial vascular structures are patent.  Mild global atrophy without hydrocephalus.  Exophthalmos.  Minimal paranasal sinus mucosal thickening.  Partially empty sella.  IMPRESSION: No acute infarct.  Mild to moderate small vessel disease type changes.  Please see above.  Original Report Authenticated By: Fuller Canada, M.D.   US Abdomen Complete  09/25/2011  *RADIOLOGY REPORT*  Clinical Data:  71 year old female with abdominal pain and nausea. History of lumbar surgery 3 weeks ago.  ABDOMINAL ULTRASOUND COMPLETE  Comparison:  05/12/2007 ultrasound  Findings:  Gallbladder:  A 5 mm gallbladder polyp is present.  There is no evidence of gallstones, gallbladder wall thickening, or pericholecystic fluid.  Common Bile Duct:  There is no evidence of intrahepatic or extrahepatic biliary dilation. The CBD measures 5.5 mm in greatest diameter.  Liver:  The liver is within normal limits in parenchymal echogenicity. No focal abnormalities are identified.  IVC:  Appears normal.  Pancreas:  Although the pancreas is difficult to visualize in its entirety, no focal pancreatic abnormality is identified.  Spleen:  Within normal limits in size and echotexture.  Right kidney:  The right kidney is normal in size and parenchymal echogenicity.  There is no evidence of solid mass,  hydronephrosis or definite renal calculi.  The right kidney measures 9.2 cm.  Left kidney:  The left kidney is normal in size and parenchymal echogenicity.  There is no evidence of solid mass, hydronephrosis or definite renal calculi.   The left kidney measures 9.2 cm.  Abdominal Aorta:  No abdominal aortic aneurysm identified.  There is no evidence of ascites.  IMPRESSION: 5 mm gallbladder polyp, otherwise negative abdominal ultrasound.  Original Report Authenticated By: Rosendo Gros, M.D.    DISCHARGE EXAMINATION: Blood pressure 127/70, pulse 73, temperature 97.7 F (36.5 C), temperature source Oral, resp. rate 17, height 5' (1.524 m), weight 81.194 kg (179 lb), SpO2 95.00%. General appearance: alert, cooperative, appears stated age and no distress Head: Normocephalic, without obvious abnormality, atraumatic Resp: clear to auscultation bilaterally Cardio: regular rate and rhythm, S1, S2 normal, no murmur, click, rub or gallop GI: soft, non-tender; bowel sounds normal; no masses,  no organomegaly Extremities: extremities normal, atraumatic, no cyanosis or edema Neurologic: Alert and oriented X 3, normal strength and tone. Normal symmetric reflexes. Normal coordination and gait  DISPOSITION: Home  Discharge Orders    Future Orders Please Complete By Expires   Diet - low sodium heart  healthy      Increase activity slowly      Comments:   While getting up from lying or sitting position, do so slowly.   Discharge instructions      Comments:   Have your INR level checked on Monday, July 22 for further instructions regarding your coumadin/warfarin.     Current Discharge Medication List    START taking these medications   Details  metoprolol tartrate (LOPRESSOR) 25 MG tablet Take 1 tablet (25 mg total) by mouth 2 (two) times daily. Qty: 60 tablet, Refills: 2      CONTINUE these medications which have CHANGED   Details  warfarin (COUMADIN) 5 MG tablet DON'T TAKE ANY TODAY, July 20.  TAKE HALF A TABLET ON Sunday (JULY 21). HAVE YOUR INR CHECKED ON Monday July 22.      CONTINUE these medications which have NOT CHANGED   Details  aspirin 81 MG tablet Take 81 mg by mouth daily as needed.    atorvastatin (LIPITOR) 20 MG tablet Take 20 mg by mouth daily.      Cyanocobalamin (VITAMIN B-12 IJ) Inject as directed every 30 (thirty) days.    cyclobenzaprine (FLEXERIL) 10 MG tablet Take 10 mg by mouth 3 (three) times daily as needed. Muscle spasms    esomeprazole (NEXIUM) 40 MG capsule Take 1 capsule (40 mg total) by mouth 2 (two) times daily. Qty: 180 capsule, Refills: 3    metoCLOPramide (REGLAN) 10 MG tablet Take 10 mg by mouth 2 (two) times daily as needed. For migraine headache    promethazine (PHENERGAN) 25 MG suppository Place 25 mg rectally every 6 (six) hours as needed.      promethazine (PHENERGAN) 25 MG tablet Take 25 mg by mouth 3 (three) times daily as needed.        STOP taking these medications     diltiazem (DILACOR XR) 240 MG 24 hr capsule      lisinopril-hydrochlorothiazide (PRINZIDE,ZESTORETIC) 20-25 MG per tablet        Follow-up Information    Follow up with Beverley Fiedler, MD. Schedule an appointment as soon as possible for a visit on 09/29/2011. (To have your INR checked)    Contact information:   [address] [place] [address] [phone]       Follow up with Marca Ancona, MD. Schedule an appointment as soon as possible for a visit in 1 week. (post hospitalization follow up)    Contact information:   [address] [place] [address] [phone]          TOTAL DISCHARGE TIME: 35 MINS  [facility]  [facility] Pager [phone]  09/27/2011, 10:13 AM

## 2011-09-27 NOTE — Progress Notes (Signed)
ANTICOAGULATION CONSULT NOTE - Follow Up Consult  Pharmacy Consult for coumadin Indication: recent DVT  No Known Allergies  Patient Measurements: Height: 5' (152.4 cm) Weight: 179 lb (81.194 kg) IBW/kg (Calculated) : 45.5  Heparin Dosing Weight:   Vital Signs: Temp: 97.7 F (36.5 C) (07/20 0500) Temp src: Oral (07/20 0500) BP: 127/70 mmHg (07/20 0500) Pulse Rate: 73  (07/20 0500)  Labs:  [name] 09/27/11 0722 09/26/11 0545 09/25/11 1200 09/25/11 0241 09/25/11 0225 09/24/11 1859 09/24/11 1215  HGB -- 12.5 -- -- 13.1 -- --  HCT -- 37.5 -- -- 38.9 -- 41.8  PLT -- 401* -- -- 379 -- 439*  APTT -- -- -- -- -- -- --  LABPROT 33.2* 37.8* -- -- 36.9* -- --  INR 3.19* 3.77* -- -- 3.66* -- --  HEPARINUNFRC -- -- -- -- -- -- --  CREATININE -- 0.86 -- -- 0.92 -- 0.88  CKTOTAL -- -- 57 45 -- 52 --  CKMB -- -- 1.8 1.4 -- 1.5 --  TROPONINI -- -- <0.30 <0.30 -- <0.30 --    Estimated Creatinine Clearance: 57.5 ml/min (by C-G formula based on Cr of 0.86).  Assessment: Patient is a 71 y.o F on coumadin for recent DVT.  INR is still supratherapeutic but has decreased from 3.77 to 3.19 after dose held for 2 days.  Goal of Therapy:  INR 2-3  Plan:  1) will continue to hold coumadin for one more day  [name] P 09/27/2011,9:37 AM

## 2011-10-08 ENCOUNTER — Ambulatory Visit (INDEPENDENT_AMBULATORY_CARE_PROVIDER_SITE_OTHER): Payer: Medicare Other | Admitting: Physician Assistant

## 2011-10-08 ENCOUNTER — Encounter: Payer: Self-pay | Admitting: Physician Assistant

## 2011-10-08 VITALS — BP 154/88 | HR 90 | Ht 60.0 in | Wt 189.0 lb

## 2011-10-08 DIAGNOSIS — I479 Paroxysmal tachycardia, unspecified: Secondary | ICD-10-CM

## 2011-10-08 DIAGNOSIS — R002 Palpitations: Secondary | ICD-10-CM

## 2011-10-08 DIAGNOSIS — I10 Essential (primary) hypertension: Secondary | ICD-10-CM

## 2011-10-08 MED ORDER — METOPROLOL TARTRATE 50 MG PO TABS: 50.0000 mg | ORAL_TABLET | Freq: Two times a day (BID) | ORAL | Status: DC

## 2011-10-08 MED ORDER — OXYCODONE-ACETAMINOPHEN 5-500 MG PO CAPS: 1.0000 | ORAL_CAPSULE | ORAL | Status: AC | PRN

## 2011-10-08 NOTE — Patient Instructions (Addendum)
Your physician has recommended that you wear an event monitor DX 785.1. Event monitors are medical devices that record the heart's electrical activity. Doctors most often [place] these monitors to diagnose arrhythmias. Arrhythmias are problems with the speed or rhythm of the heartbeat. The monitor is a small, portable device. You can wear one while you do your normal daily activities. This is usually used to diagnose what is causing palpitations/syncope (passing out).  Your physician has recommended you make the following change in your medication: INCREASE METOPROLOL TO 50 MG TWICE DAILY, A NEW PRESCRIPTION FOR THE 50 MG TABLET WAS SENT IN TO [facility] TODAY FOR YOU   Your physician recommends that you schedule a follow-up appointment in: 6-8 WEEKS WITH DR. Shirlee Latch

## 2011-10-08 NOTE — Progress Notes (Signed)
[address] [place], [place]  [address] Phone: [phone] Fax:  [phone]  Date:  10/08/2011   Name:  [name]   DOB:  [birth year]   MRN:  [id number]  PCP:  Beverley Fiedler, MD  Primary Cardiologist:  Dr. Marca Ancona  Primary Electrophysiologist:  None    History of Present Illness: [name] is a 71 y.o. female who returns for post hospital follow up.  She has a history of HTN, GERD, and migraines. She has had several symptoms, including palpitations, chest pain and dyspnea.  48 hour holter monitor showed occasional PACs.  Lexiscan myoview showed normal EF with breast attenuation and no ischemia or infarction.  Labs (2/13): BNP 35.   She underwent lumbar laminectomy 08/2011. Her postoperative course was complicated by left lower extremity DVT and she was placed on chronic Coumadin. She was then admitted 7/17-7/20 with rapid palpitations and near syncope. She ruled out for myocardial infarction by enzymes. TSH was normal. Chest CT was negative for pulmonary embolism. She did have significant blood pressure drop from lying to standing. She was seen in consultation by Dr. Graciela Husbands who noted that she had developed autonomic insufficiency after spinal surgery. He reviewed the data and information from the 1990s indicated autonomic insufficiency occurring as much as 20% of patients after lumbar laminectomy. He discontinued her HCTZ and diltiazem and placed on low-dose beta blocker. Echocardiogram 09/24/11: Mild LVH, EF 65-70%. Discharge notes indicate she was symptomatically improved on beta blocker therapy. She returns today for followup.  She has a lot of concerns about her BP today.  She shared with me her notebook that records all of her BPs dating back several mos.  She has been taking orthostatic VS at home as well.  Dizziness is better.  She denies syncope or near syncope.  She is noting a lot of palpitations.  Worse when she takes her BP.  Denies CP.   Denies significant dyspnea.  No orthopnea, PND.  No LE edema.    Wt Readings from Last 3 Encounters:  10/08/11 189 lb (85.73 kg)  09/24/11 179 lb (81.194 kg)  09/02/11 188 lb 7.9 oz (85.5 kg)     Potassium  Date/Time Value Range Status  09/26/2011  5:45 AM 3.8  3.5 - 5.1 mEq/L Final     Creatinine, Ser  Date/Time Value Range Status  09/26/2011  5:45 AM 0.86  0.50 - 1.10 mg/dL Final     ALT  Date/Time Value Range Status  09/26/2011  5:45 AM 11  0 - 35 U/L Final     TSH  Date/Time Value Range Status  09/24/2011  6:59 PM 2.234  0.350 - 4.500 uIU/mL Final     Hemoglobin  Date/Time Value Range Status  09/26/2011  5:45 AM 12.5  12.0 - 15.0 g/dL Final   Past Medical History: 1. GERD with esophageal stricture and hiatal hernia.  2. Low back pain  3. Hyperlipidemia  4. HTN  5. Migraines.  6. Echo (5/11): EF 60-65%, no significant valvular abnormalities.  7. Scoliosis  8. B12 deficiency  9. Osteoarthritis  10. Diverticulitis with surgery in 2001  11. Atypical chest pain: Lexiscan myoview (2/13) with EF 80%, breast attenuation, no evidence for ischemia or infarction.  12. Palpitations: Holter (2/13) with occasional PACs.   Current Outpatient Prescriptions  Medication Sig Dispense Refill  . aspirin 81 MG tablet Take 81 mg by mouth daily as needed.      [name] atorvastatin (LIPITOR) 20 MG  tablet Take 20 mg by mouth daily.        . Cyanocobalamin (VITAMIN B-12 IJ) Inject as directed every 30 (thirty) days.      . cyclobenzaprine (FLEXERIL) 10 MG tablet Take 10 mg by mouth 3 (three) times daily as needed. Muscle spasms      . esomeprazole (NEXIUM) 40 MG capsule Take 1 capsule (40 mg total) by mouth 2 (two) times daily.  180 capsule  3  . metoCLOPramide (REGLAN) 10 MG tablet Take 10 mg by mouth 2 (two) times daily as needed. For migraine headache      . metoprolol tartrate (LOPRESSOR) 25 MG tablet Take 1 tablet (25 mg total) by mouth 2 (two) times daily.  60 tablet  2  . promethazine  (PHENERGAN) 25 MG suppository Place 25 mg rectally every 6 (six) hours as needed.        . warfarin (COUMADIN) 5 MG tablet DON'T TAKE ANY TODAY, July 20. TAKE HALF A TABLET ON Sunday (JULY 21). HAVE YOUR INR CHECKED ON Monday July 22.      [name] DISCONTD: promethazine (PHENERGAN) 25 MG tablet Take 25 mg by mouth 3 (three) times daily as needed.          Allergies: No Known Allergies  History  Substance Use Topics  . Smoking status: Never Smoker   . Smokeless tobacco: Never Used  . Alcohol Use: No     ROS:  Please see the history of present illness.   Notes urge incontinence.  PCP checked u/a recently.    All other systems reviewed and negative.   PHYSICAL EXAM: VS:  BP 154/88  Pulse 90  Ht 5' (1.524 m)  Wt 189 lb (85.73 kg)  BMI 36.91 kg/m2 Well nourished, well developed, in no acute distress HEENT: normal Neck: no JVD at 90 degrees Cardiac:  normal S1, S2; RRR; no murmur Lungs:  clear to auscultation bilaterally, no wheezing, rhonchi or rales Abd: soft, nontender, no hepatomegaly Ext: no edema Skin: warm and dry Neuro:  CNs 2-12 intact, no focal abnormalities noted   ASSESSMENT AND PLAN:  1.  Hypertension Still uncontrolled. I spent a great deal of time with her today on this issue. I took her BP with her wrist cuff and it read 190/96.  My BP by manual cuff was 164/84.  I have explained to her that I believe her cuff is inaccurate.  She will try to get a new cuff.  We also discussed that she does not need to take her BP as often. I will increase her Metoprolol to 50 mg bid.  2.  Autonomic Insufficiency I spent a large amount of time discussing this issue as well. Her husband is frustrated that no one is telling her what the problem is.  I spent time discussing physiology and the reason her HR and BP change from lying to standing. This may be exacerbated by her spine surgery.  She can discuss this further with her surgeon to discuss the frequency of this problem.  3.   Palpitations This is problematic for her. Arrange event monitor. Adjust metoprolol. Follow up with Dr. Marca Ancona in 6-8 weeks.  4.  Incontinence I have asked her to follow up with her neurosurgeon for this problems as she had recent lumbar spine surgery.  Signed, Tereso Newcomer, PA-C  3:41 PM 10/08/2011

## 2011-10-09 ENCOUNTER — Encounter (INDEPENDENT_AMBULATORY_CARE_PROVIDER_SITE_OTHER): Payer: Medicare Other

## 2011-10-09 DIAGNOSIS — R002 Palpitations: Secondary | ICD-10-CM

## 2011-10-29 ENCOUNTER — Other Ambulatory Visit: Payer: Self-pay | Admitting: Neurosurgery

## 2011-10-29 DIAGNOSIS — M5137 Other intervertebral disc degeneration, lumbosacral region: Secondary | ICD-10-CM

## 2011-10-29 DIAGNOSIS — M4716 Other spondylosis with myelopathy, lumbar region: Secondary | ICD-10-CM

## 2011-10-29 DIAGNOSIS — IMO0002 Reserved for concepts with insufficient information to code with codable children: Secondary | ICD-10-CM

## 2011-10-29 DIAGNOSIS — M51379 Other intervertebral disc degeneration, lumbosacral region without mention of lumbar back pain or lower extremity pain: Secondary | ICD-10-CM

## 2011-10-29 DIAGNOSIS — M48061 Spinal stenosis, lumbar region without neurogenic claudication: Secondary | ICD-10-CM

## 2011-11-05 ENCOUNTER — Ambulatory Visit
Admission: RE | Admit: 2011-11-05 | Discharge: 2011-11-05 | Disposition: A | Discharge status: Home / Self Care | Payer: Medicare Other | Source: Ambulatory Visit | Attending: Neurosurgery | Admitting: Neurosurgery

## 2011-11-05 DIAGNOSIS — M4716 Other spondylosis with myelopathy, lumbar region: Secondary | ICD-10-CM

## 2011-11-05 DIAGNOSIS — IMO0002 Reserved for concepts with insufficient information to code with codable children: Secondary | ICD-10-CM

## 2011-11-05 DIAGNOSIS — M48061 Spinal stenosis, lumbar region without neurogenic claudication: Secondary | ICD-10-CM

## 2011-11-05 DIAGNOSIS — M5137 Other intervertebral disc degeneration, lumbosacral region: Secondary | ICD-10-CM

## 2011-11-05 DIAGNOSIS — M51379 Other intervertebral disc degeneration, lumbosacral region without mention of lumbar back pain or lower extremity pain: Secondary | ICD-10-CM

## 2011-11-17 ENCOUNTER — Encounter: Payer: Self-pay | Admitting: Physician Assistant

## 2011-11-17 ENCOUNTER — Telehealth: Payer: Self-pay | Admitting: *Deleted

## 2011-11-17 NOTE — Telephone Encounter (Signed)
Called pt with the results of her event monitor which demonstrated  No significant arrhythmic events per Dr Shirlee Latch.  Pt states understanding.  She reports that she has not been able to "hold her water at all esp in the mornings".  She had an MRI but has not heard the results from it as of yet.  Pt also discussed prior "blood clot"  in her left leg.  She states her leg was swollen but is getting better.  She does not have any pain at this time.  She is on Coumadin and will follow as instructed.  She will call back if she develops more swelling or any pain in that leg.  She is scheduled for a follow up appointment with Dr Shirlee Latch 9/16 and she is aware.

## 2011-11-24 ENCOUNTER — Emergency Department (HOSPITAL_COMMUNITY)
Admission: EM | Admit: 2011-11-24 | Discharge: 2011-11-25 | Disposition: A | Discharge status: Home / Self Care | Payer: Medicare Other | Attending: Emergency Medicine | Admitting: Emergency Medicine

## 2011-11-24 ENCOUNTER — Emergency Department (HOSPITAL_COMMUNITY): Payer: Medicare Other

## 2011-11-24 ENCOUNTER — Ambulatory Visit (INDEPENDENT_AMBULATORY_CARE_PROVIDER_SITE_OTHER): Payer: Medicare Other | Admitting: Cardiology

## 2011-11-24 ENCOUNTER — Encounter: Payer: Self-pay | Admitting: Cardiology

## 2011-11-24 ENCOUNTER — Encounter (HOSPITAL_COMMUNITY): Payer: Self-pay | Admitting: *Deleted

## 2011-11-24 VITALS — BP 174/82 | HR 61 | Ht 60.0 in | Wt 191.8 lb

## 2011-11-24 DIAGNOSIS — I10 Essential (primary) hypertension: Secondary | ICD-10-CM | POA: Insufficient documentation

## 2011-11-24 DIAGNOSIS — R200 Anesthesia of skin: Secondary | ICD-10-CM

## 2011-11-24 DIAGNOSIS — K219 Gastro-esophageal reflux disease without esophagitis: Secondary | ICD-10-CM | POA: Insufficient documentation

## 2011-11-24 DIAGNOSIS — I82409 Acute embolism and thrombosis of unspecified deep veins of unspecified lower extremity: Secondary | ICD-10-CM

## 2011-11-24 DIAGNOSIS — Z833 Family history of diabetes mellitus: Secondary | ICD-10-CM | POA: Insufficient documentation

## 2011-11-24 DIAGNOSIS — I951 Orthostatic hypotension: Secondary | ICD-10-CM

## 2011-11-24 DIAGNOSIS — Z86718 Personal history of other venous thrombosis and embolism: Secondary | ICD-10-CM | POA: Insufficient documentation

## 2011-11-24 DIAGNOSIS — E669 Obesity, unspecified: Secondary | ICD-10-CM | POA: Insufficient documentation

## 2011-11-24 DIAGNOSIS — R000 Tachycardia, unspecified: Secondary | ICD-10-CM

## 2011-11-24 DIAGNOSIS — R0609 Other forms of dyspnea: Secondary | ICD-10-CM

## 2011-11-24 DIAGNOSIS — Z823 Family history of stroke: Secondary | ICD-10-CM | POA: Insufficient documentation

## 2011-11-24 DIAGNOSIS — Z7901 Long term (current) use of anticoagulants: Secondary | ICD-10-CM | POA: Insufficient documentation

## 2011-11-24 DIAGNOSIS — R202 Paresthesia of skin: Secondary | ICD-10-CM

## 2011-11-24 DIAGNOSIS — R0989 Other specified symptoms and signs involving the circulatory and respiratory systems: Secondary | ICD-10-CM

## 2011-11-24 DIAGNOSIS — R0600 Dyspnea, unspecified: Secondary | ICD-10-CM

## 2011-11-24 DIAGNOSIS — Z800 Family history of malignant neoplasm of digestive organs: Secondary | ICD-10-CM | POA: Insufficient documentation

## 2011-11-24 DIAGNOSIS — R209 Unspecified disturbances of skin sensation: Secondary | ICD-10-CM | POA: Insufficient documentation

## 2011-11-24 DIAGNOSIS — Z8249 Family history of ischemic heart disease and other diseases of the circulatory system: Secondary | ICD-10-CM | POA: Insufficient documentation

## 2011-11-24 LAB — CBC WITH DIFFERENTIAL/PLATELET
Basophils Absolute: 0 10*3/uL (ref 0.0–0.1)
Basophils Relative: 1 % (ref 0–1)
Eosinophils Absolute: 0.2 10*3/uL (ref 0.0–0.7)
HCT: 43 % (ref 36.0–46.0)
MCH: 27.8 pg (ref 26.0–34.0)
MCHC: 33.3 g/dL (ref 30.0–36.0)
Monocytes Absolute: 0.8 10*3/uL (ref 0.1–1.0)
Monocytes Relative: 9 % (ref 3–12)
Neutro Abs: 5.3 10*3/uL (ref 1.7–7.7)
Neutrophils Relative %: 65 % (ref 43–77)
RDW: 14.2 % (ref 11.5–15.5)

## 2011-11-24 LAB — PROTIME-INR
INR: 1.52 — ABNORMAL HIGH (ref 0.00–1.49)
Prothrombin Time: 18.6 seconds — ABNORMAL HIGH (ref 11.6–15.2)

## 2011-11-24 LAB — COMPREHENSIVE METABOLIC PANEL
AST: 21 U/L (ref 0–37)
Albumin: 3.9 g/dL (ref 3.5–5.2)
BUN: 16 mg/dL (ref 6–23)
Chloride: 105 mEq/L (ref 96–112)
Creatinine, Ser: 0.79 mg/dL (ref 0.50–1.10)
Total Bilirubin: 0.9 mg/dL (ref 0.3–1.2)
Total Protein: 7.1 g/dL (ref 6.0–8.3)

## 2011-11-24 LAB — TROPONIN I: Troponin I: <0.3 ng/mL (ref <0.30)

## 2011-11-24 MED ORDER — MORPHINE SULFATE 4 MG/ML IJ SOLN: 4.0000 mg | Freq: Once | INTRAMUSCULAR | Status: DC

## 2011-11-24 MED ORDER — HYDROCHLOROTHIAZIDE 25 MG PO TABS: 25.0000 mg | ORAL_TABLET | Freq: Every day | ORAL | Status: DC

## 2011-11-24 NOTE — ED Notes (Signed)
The pt had an episode of lt tongue  Numbness and some lt arm numbness this am.  ems saw here but her symptoms had resolve..She is taking various herbal and vitamin products, **her*. bp has been high and she has an irregular heart beat that is normal.  Nauseated and she has a headache she took her migraine  Headache med.  Now the headache is gone.  She does not feel well

## 2011-11-24 NOTE — ED Notes (Signed)
The pt also has back pain and past back surgery

## 2011-11-24 NOTE — Patient Instructions (Addendum)
Start hydrochlorothiazide 25mg  daily.  Your physician recommends that you return for lab work in: 2 weeks--BMET.   Take and record your blood pressure daily. I will call you in about 2 weeks to get the readings. Luana Shu [phone].  Use compression stockings to help the swelling in your feet and ankles. I have given you a prescription today for knee high stockings 20-30 mmHg.   Your physician recommends that you schedule a follow-up appointment in: 2 months with Dr Shirlee Latch.

## 2011-11-24 NOTE — ED Provider Notes (Addendum)
History     CSN: [id number] Arrival date & time 11/24/11  1612 First MD Initiated Contact with Patient 11/24/11 2200      Chief Complaint  Patient presents with  . multiple complaints    HPI Pt was worried that she had symptoms of a stroke.  Pt developed numbness on the left side of her tongue.  She also had tingling and numbness in her left hand.  The symptoms lasted for a few minutes.  By the time EMS arrived the symptoms had resolved.  She has not had symptoms like this before.  She has had migraines but has not had this before.    Past Medical History  Diagnosis Date  . Obesity, unspecified   . Stricture and stenosis of esophagus   . Diverticulosis of colon (without mention of hemorrhage)   . Personal history of colonic polyps 05/31/2007    adenomatous polyp  . Esophageal reflux   . Unspecified gastritis and gastroduodenitis without mention of hemorrhage   . Hiatal hernia   . Scoliosis   . Hyperlipidemia   . Hypertension   . Osteoporosis   . Family history of malignant neoplasm of gastrointestinal tract   . Dysrhythmia     notes in epic  . DVT of leg (deep venous thrombosis) 09/2011    LLE  . Pneumonia     "3 times at least" (09/24/11)  . Migraines 09/24/11    "often recently;  more controlled now"  . Rheumatoid arthritis   . Arthritis     "got alot; especially in my spine"  . Fibromyalgia     "dx'd w/it when it first came out"  . Chronic back pain     "all of it"  . Palpitations     event monitor 8/13: NSR, Sinus brady, Sinus Tachy, PAC    Past Surgical History  Procedure Date  . Sigmoid resection / rectopexy 09/1999    Dr Earlene Plater  . Salivary gland surgery ~ 1980    1 removed  . Shoulder surgery 2008    right shoulder and collar bone  . Colonoscopy 2009  . Upper gastrointestinal endoscopy   . Breast surgery     cyst lanced and drainded  . Tumor removal 1960's    left leg  . Lumbar laminectomy/decompression microdiscectomy 09/03/2011    Procedure: LUMBAR  LAMINECTOMY/DECOMPRESSION MICRODISCECTOMY 2 LEVELS;  Surgeon: Mariam Dollar, MD;  Location: [facility];  Service: Neurosurgery;  Laterality: Left;  Left Lumbar Three-Four, Lumbar Four-Five Decompressive Laminectomy  . Exploratory laparotomy 04/2000    "for ruptured colon"  . Esophageal dilation     "several times"  . Trigger finger release 2004    left thumb  . Breast cyst incision and drainage 2012    left; "golf-ball sized"  . Back surgery   . Hematoma evacuation 09/2011    LLE  . Colon surgery     Family History  Problem Relation Age of Onset  . Pancreatic cancer Sister   . Stroke Mother   . Hypertension Mother   . Stroke Father   . Hypertension Father   . Diabetes Father   . Diabetes Sister     x 2    History  Substance Use Topics  . Smoking status: Never Smoker   . Smokeless tobacco: Never Used  . Alcohol Use: No    OB History    Grav Para Term Preterm Abortions TAB SAB Ect Mult Living  Review of Systems  Constitutional: Negative for fever.  Cardiovascular: Negative for chest pain.  Musculoskeletal: Positive for back pain.  Neurological: Positive for numbness. Negative for tremors, seizures and speech difficulty.  All other systems reviewed and are negative.    Allergies  Review of patient's allergies indicates no known allergies.  Home Medications   Current Outpatient Rx  Name Route Sig Dispense Refill  . ATORVASTATIN CALCIUM 20 MG PO TABS Oral Take 20 mg by mouth at bedtime.     [name] VITAMIN B-12 IJ Injection Inject as directed every 30 (thirty) days.    [name] ESOMEPRAZOLE MAGNESIUM 40 MG PO CPDR Oral Take 40 mg by mouth daily before breakfast.    . METOCLOPRAMIDE HCL 10 MG PO TABS Oral Take 10 mg by mouth 2 (two) times daily as needed. For migraine headache    . METOPROLOL TARTRATE 50 MG PO TABS Oral Take 1 tablet (50 mg total) by mouth 2 (two) times daily. 60 tablet 11  . OXYCODONE-ACETAMINOPHEN 5-500 MG PO CAPS Oral Take 1 capsule by mouth  every 4 (four) hours as needed. For migraine headaches    . PROMETHAZINE HCL 25 MG RE SUPP Rectal Place 25 mg rectally every 6 (six) hours as needed. For nausea    . PROMETHAZINE HCL 25 MG PO TABS Oral Take 25 mg by mouth every 6 (six) hours as needed. For nausea    . WARFARIN SODIUM 5 MG PO TABS Oral Take 2.5-5 mg by mouth at bedtime. 0.5 half tab (2.5 mg total) Tues, Thurs, Sat, and Sun; All other day 1 tab (5 mg total)      BP 184/53  Pulse 67  Temp 98.2 F (36.8 C) (Oral)  Resp 18  SpO2 98%  Physical Exam  Nursing note and vitals reviewed. Constitutional: She is oriented to person, place, and time. She appears well-developed and well-nourished. No distress.  HENT:  Head: Normocephalic and atraumatic.  Right Ear: External ear normal.  Left Ear: External ear normal.  Mouth/Throat: Oropharynx is clear and moist.  Eyes: Conjunctivae normal are normal. Right eye exhibits no discharge. Left eye exhibits no discharge. No scleral icterus.  Neck: Neck supple. No tracheal deviation present.  Cardiovascular: Normal rate, regular rhythm and intact distal pulses.   Pulmonary/Chest: Effort normal and breath sounds normal. No stridor. No respiratory distress. She has no wheezes. She has no rales.  Abdominal: Soft. Bowel sounds are normal. She exhibits no distension. There is no tenderness. There is no rebound and no guarding.  Musculoskeletal: She exhibits no edema and no tenderness.  Neurological: She is alert and oriented to person, place, and time. She has normal strength. No cranial nerve deficit ( no gross defecits noted) or sensory deficit. She exhibits normal muscle tone. She displays no seizure activity. Coordination normal.       No pronator drift bilateral upper extrem, able to hold both legs off bed for 5 seconds, sensation intact in all extremities, no visual field cuts, no left or right sided neglect  Skin: Skin is warm and dry. No rash noted.  Psychiatric: She has a normal mood and  affect.    ED Course  Procedures (including critical care time)  Rate: 68  Rhythm: normal sinus rhythm  QRS Axis: normal  Intervals: normal  ST/T Wave abnormalities: normal  Conduction Disutrbances:none  Narrative Interpretation:   Old EKG Reviewed: none available  Labs Reviewed  CBC WITH DIFFERENTIAL - Abnormal; Notable for the following:    RBC 5.14 (*)  All other components within normal limits  COMPREHENSIVE METABOLIC PANEL - Abnormal; Notable for the following:    Glucose, Bld 108 (*)     GFR calc non Af Amer 82 (*)     All other components within normal limits  PROTIME-INR - Abnormal; Notable for the following:    Prothrombin Time 18.6 (*)     INR 1.52 (*)     All other components within normal limits  TROPONIN I   Ct Head Wo Contrast  11/24/2011  *RADIOLOGY REPORT*  Clinical Data: Left-sided arm and face numbness.  CT HEAD WITHOUT CONTRAST  Technique:  Contiguous axial images were obtained from the base of the skull through the vertex without contrast.  Comparison: 09/25/2011 MRI, 09/24/2011 CT  Findings: Periventricular and subcortical white matter hypodensities are most in keeping with chronic microangiopathic change.  Remote lacunar infarction centered about the right corona radiata/caudate head.  There is no evidence for acute hemorrhage, hydrocephalus, mass lesion, or abnormal extra-axial fluid collection.  No definite CT evidence for acute infarction.  The visualized paranasal sinuses and mastoid air cells are predominately clear.  IMPRESSION: White matter changes without CT evidence of acute intracranial abnormality.   Original Report Authenticated By: Waneta Martins, M.D.      1. Numbness and tingling       MDM  The patient had transient episodes of tingling earlier today. The symptoms have all resolved at this point. She never had any focal weakness or trouble with her speech. I reviewed her old records. The patient recently had an MRI of her brain in  July of this year that was unremarkable.  She also mentions having a tia workup with carotid dopplers and an echocardiogram.  I do not fell repeat workup is necessary at this time.  Pt can closely follow up with her PCP this week.    Celene Kras, MD 11/24/11 [phone]

## 2011-11-25 DIAGNOSIS — I951 Orthostatic hypotension: Secondary | ICD-10-CM | POA: Insufficient documentation

## 2011-11-25 NOTE — Progress Notes (Signed)
Patient ID: [name], female   DOB: [birth year], 71 y.o.   MRN: [id number] PCP: Dr. Barbaraann Walker  71 y.o. with history of HTN, GERD, autonomic insufficiency, and migraines returns for cardiology followup.  Since the last time I saw her, she had a lumbar laminectomy (6/13).  After this, she had a left lower leg DVT.  After the operation, she developed orthostatic symptoms manifested predominantly as postural tachycardia.  HCTZ and diltiazem were stopped and she was left on metoprolol.  She actually feels better now.  Less tachypalpitations.  Unfortunately, her BP is now running high.  She denies lightheadedness, falls, or syncope.  SBP is running 150s-160s in the morning when she checks at home.  Stable exertional dyspnea with moderate activity.  No chest pain.   Labs (2/13): BNP 35 Labs (7/13): K 3.8, creatinine 0.86  PMH: 1. GERD with esophageal stricture and hiatal hernia.   2. Low back pain: s/p lumbar laminectomy in 6/13.  3. Hyperlipidemia 4. HTN 5. Migraines.  6. Echo (7/13): EF 65-70%, mild LVH  7. Scoliosis 8. B12 deficiency 9. Osteoarthritis 10. Diverticulitis with surgery in 2001 11. Atypical chest pain: Lexiscan myoview (2/13) with EF 80%, breast attenuation, no evidence for ischemia or infarction.  12. Palpitations: Holter (2/13) with occasional PACs.  13. Carotid dopplers (7/13) with minimal disease.  14. Autonomic insufficiency: post-lumbar laminectomy in 6/13.  Predominantly manifested as postural tachycardia.  15. DVT 6/13 post-surgery.   SH: Lives in [place], married, nonsmoker.   FH: Father with "heart trouble"  ROS: All systems reviewed and negative except as per HPI.   Current Outpatient Prescriptions  Medication Sig Dispense Refill  . atorvastatin (LIPITOR) 20 MG tablet Take 20 mg by mouth at bedtime.       . Cyanocobalamin (VITAMIN B-12 IJ) Inject as directed every 30 (thirty) days.      . metoCLOPramide (REGLAN) 10 MG tablet Take 10 mg by mouth 2 (two) times  daily as needed. For migraine headache      . metoprolol tartrate (LOPRESSOR) 50 MG tablet Take 1 tablet (50 mg total) by mouth 2 (two) times daily.  60 tablet  11  . promethazine (PHENERGAN) 25 MG suppository Place 25 mg rectally every 6 (six) hours as needed. For nausea      . esomeprazole (NEXIUM) 40 MG capsule Take 40 mg by mouth daily before breakfast.      . oxyCODONE-acetaminophen (TYLOX) 5-500 MG per capsule Take 1 capsule by mouth every 4 (four) hours as needed. For migraine headaches      . promethazine (PHENERGAN) 25 MG tablet Take 25 mg by mouth every 6 (six) hours as needed. For nausea      . warfarin (COUMADIN) 5 MG tablet Take 2.5-5 mg by mouth at bedtime. 0.5 half tab (2.5 mg total) Tues, Thurs, Sat, and Sun; All other day 1 tab (5 mg total)       No current facility-administered medications for this visit.   Facility-Administered Medications Ordered in Other Visits  Medication Dose Route Frequency Provider Last Rate Last Dose  . DISCONTD: morphine 4 MG/ML injection 4 mg  4 mg Intravenous Once Celene Kras, MD        BP 174/82  Pulse 61  Ht 5' (1.524 m)  Wt 191 lb 12.8 oz (87 kg)  BMI 37.46 kg/m2 General: NAD, obese.  Neck: No JVD, no thyromegaly or thyroid nodule.  Lungs: Clear to auscultation bilaterally with normal respiratory effort. CV: Nondisplaced PMI.  Heart  regular S1/S2, no S3/S4, no murmur.  L>R lower leg swelling with varicosities left lower leg.  No carotid bruit.  Normal pedal pulses.  Abdomen: Soft, nontender, no hepatosplenomegaly, no distention.  Neurologic: Alert and oriented x 3.  Psych: Normal affect. Extremities: No clubbing or cyanosis.   Assessment/Plan: 1. Autonomic insufficiency Improved. Possibly related to lumbar laminectomy (can be seen post-spinal surgery).  Predominantly had postural tachycardia.  Continue metoprolol.  I will have her wear compression stockings.  2. Exertional dyspnea Chronic.  No ischemia or infarction on prior  myoview.  I suspect that the exertional dyspnea is primarily due to obesity and deconditioning.  3. Hypertension  BP running high. I will try restarting her HCTZ 25 mg daily.  BMET and BP check in 2 wks.  She will also wear compression stockings.  If she develops orthostatic symptoms after starting HCTZ, she will call us.  4. DVT Post-surgical.  Since she is on warfarin, she can stop ASA.   Dalton [employer]

## 2011-12-08 ENCOUNTER — Other Ambulatory Visit (INDEPENDENT_AMBULATORY_CARE_PROVIDER_SITE_OTHER): Payer: Medicare Other

## 2011-12-08 DIAGNOSIS — R000 Tachycardia, unspecified: Secondary | ICD-10-CM

## 2011-12-09 ENCOUNTER — Telehealth: Payer: Self-pay | Admitting: *Deleted

## 2011-12-09 DIAGNOSIS — I10 Essential (primary) hypertension: Secondary | ICD-10-CM

## 2011-12-09 LAB — BASIC METABOLIC PANEL
Chloride: 105 mEq/L (ref 96–112)
GFR: 54.97 mL/min — ABNORMAL LOW (ref >60.00)
Potassium: 4.6 mEq/L (ref 3.5–5.1)
Sodium: 140 mEq/L (ref 135–145)

## 2011-12-09 NOTE — Telephone Encounter (Signed)
.   Hypertension  BP running high. I will try restarting her HCTZ 25 mg daily. BMET and BP check in 2 wks. She will also wear compression stockings. If she develops orthostatic symptoms after starting HCTZ, she will call us.   12/09/11--spoke with pt. Recent BP readings-11/25/11 156/69 11/26/11 189/89  11/27/11 159/79 11/28/11 151/67 11/29/11 149/68 12/04/11 130/65 12/09/11 146/82 (pt states she has not taken metoprolol this morning).BMET done 12/08/11. I will forward to Dr Shirlee Latch for review.

## 2011-12-10 NOTE — Telephone Encounter (Signed)
NA

## 2011-12-10 NOTE — Telephone Encounter (Signed)
BP still rather high.  Add lisinopril 10 mg daily with BMET/BP check in 2 wks.  Have her call if she develops orthostatic symptoms.

## 2011-12-15 NOTE — Telephone Encounter (Signed)
NA

## 2011-12-17 MED ORDER — LISINOPRIL 10 MG PO TABS: 10.0000 mg | ORAL_TABLET | Freq: Every day | ORAL | Status: DC

## 2011-12-17 NOTE — Telephone Encounter (Signed)
Pt will call before 01/02/12 if she develops orthostatic symptoms.

## 2011-12-17 NOTE — Telephone Encounter (Signed)
Spoke with pt. Pt will begin lisinopril 10mg  daily, return for BMET 01/01/12, and call our office with her BP readings 01/02/12.

## 2012-01-01 ENCOUNTER — Other Ambulatory Visit (INDEPENDENT_AMBULATORY_CARE_PROVIDER_SITE_OTHER): Payer: Medicare Other

## 2012-01-01 DIAGNOSIS — I10 Essential (primary) hypertension: Secondary | ICD-10-CM

## 2012-01-02 LAB — BASIC METABOLIC PANEL
CO2: 28 mEq/L (ref 19–32)
Calcium: 8.9 mg/dL (ref 8.4–10.5)
Chloride: 104 mEq/L (ref 96–112)
Glucose, Bld: 85 mg/dL (ref 70–99)
Potassium: 4 mEq/L (ref 3.5–5.1)
Sodium: 139 mEq/L (ref 135–145)

## 2012-01-06 ENCOUNTER — Other Ambulatory Visit: Payer: Self-pay | Admitting: Neurosurgery

## 2012-01-06 DIAGNOSIS — M549 Dorsalgia, unspecified: Secondary | ICD-10-CM

## 2012-01-07 ENCOUNTER — Telehealth: Payer: Self-pay | Admitting: *Deleted

## 2012-01-07 NOTE — Telephone Encounter (Signed)
Message copied by Burnell Blanks on Wed Jan 07, 2012 10:55 AM ------      Message from: Laurey Morale      Created: Fri Jan 02, 2012  3:40 PM       Lab ok

## 2012-01-07 NOTE — Telephone Encounter (Signed)
Advised patient of lab results  

## 2012-01-08 ENCOUNTER — Ambulatory Visit
Admission: RE | Admit: 2012-01-08 | Discharge: 2012-01-08 | Disposition: A | Discharge status: Home / Self Care | Payer: Medicare Other | Source: Ambulatory Visit | Attending: Neurosurgery | Admitting: Neurosurgery

## 2012-01-08 DIAGNOSIS — M549 Dorsalgia, unspecified: Secondary | ICD-10-CM

## 2012-01-28 ENCOUNTER — Encounter: Payer: Self-pay | Admitting: Cardiology

## 2012-01-28 ENCOUNTER — Ambulatory Visit (INDEPENDENT_AMBULATORY_CARE_PROVIDER_SITE_OTHER): Payer: Medicare Other | Admitting: Cardiology

## 2012-01-28 VITALS — BP 136/80 | HR 56 | Ht 60.0 in | Wt 198.0 lb

## 2012-01-28 DIAGNOSIS — R0600 Dyspnea, unspecified: Secondary | ICD-10-CM

## 2012-01-28 DIAGNOSIS — R0609 Other forms of dyspnea: Secondary | ICD-10-CM

## 2012-01-28 DIAGNOSIS — I951 Orthostatic hypotension: Secondary | ICD-10-CM

## 2012-01-28 DIAGNOSIS — R0989 Other specified symptoms and signs involving the circulatory and respiratory systems: Secondary | ICD-10-CM

## 2012-01-28 DIAGNOSIS — I82409 Acute embolism and thrombosis of unspecified deep veins of unspecified lower extremity: Secondary | ICD-10-CM

## 2012-01-28 DIAGNOSIS — R002 Palpitations: Secondary | ICD-10-CM

## 2012-01-28 MED ORDER — ATORVASTATIN CALCIUM 20 MG PO TABS: 20.0000 mg | ORAL_TABLET | Freq: Every day | ORAL | Status: DC

## 2012-01-28 MED ORDER — METOPROLOL TARTRATE 50 MG PO TABS: 50.0000 mg | ORAL_TABLET | Freq: Two times a day (BID) | ORAL | Status: DC

## 2012-01-28 MED ORDER — LISINOPRIL 10 MG PO TABS: 10.0000 mg | ORAL_TABLET | Freq: Every day | ORAL | Status: DC

## 2012-01-28 MED ORDER — HYDROCHLOROTHIAZIDE 25 MG PO TABS: 25.0000 mg | ORAL_TABLET | Freq: Every day | ORAL | Status: DC

## 2012-01-28 NOTE — Progress Notes (Signed)
Patient ID: [name], female   DOB: 1941/02/15, 71 y.o.   MRN: [id number] PCP: Dr. Barbaraann Barthel  71 yo with history of HTN, GERD, autonomic insufficiency, and migraines returns for cardiology followup.  She had a lumbar laminectomy in 6/13.  After this, she had a left lower leg DVT.  Also after the operation, she developed orthostatic symptoms manifested predominantly as postural tachycardia.  HCTZ and diltiazem were stopped and she was left on metoprolol.  BP started to run high after this.   At last appointment, I titrated back up her BP meds.  She has had no further orthostatic symptoms.  She is wearing compression stockings. She is not very active but does her housework and walks to the mailbox without problems.  She continues on coumadin for DVT.   Labs (2/13): BNP 35 Labs (7/13): K 3.8, creatinine 0.86 Labs (10/13): K 4, creatinine 0.8  PMH: 1. GERD with esophageal stricture and hiatal hernia.   2. Low back pain: s/p lumbar laminectomy in 6/13.  3. Hyperlipidemia 4. HTN 5. Migraines.  6. Echo (7/13): EF 65-70%, mild LVH  7. Scoliosis 8. B12 deficiency 9. Osteoarthritis 10. Diverticulitis with surgery in 2001 11. Atypical chest pain: Lexiscan myoview (2/13) with EF 80%, breast attenuation, no evidence for ischemia or infarction.  12. Palpitations: Holter (2/13) with occasional PACs.  13. Carotid dopplers (7/13) with minimal disease.  14. Autonomic insufficiency: post-lumbar laminectomy in 6/13.  Predominantly manifested as postural tachycardia.  15. DVT 6/13 post-surgery.   SH: Lives in [place], married, nonsmoker.   FH: Father with "heart trouble"   Current Outpatient Prescriptions  Medication Sig Dispense Refill  . Cyanocobalamin (VITAMIN B-12 IJ) Inject as directed every 30 (thirty) days.      [name] esomeprazole (NEXIUM) 40 MG capsule Take 40 mg by mouth daily before breakfast.      . hydrochlorothiazide (HYDRODIURIL) 25 MG tablet Take 1 tablet (25 mg total) by mouth  daily.  90 tablet  3  . metoCLOPramide (REGLAN) 10 MG tablet Take 10 mg by mouth 2 (two) times daily as needed. For migraine headache      . oxyCODONE-acetaminophen (TYLOX) 5-500 MG per capsule Take 1 capsule by mouth every 4 (four) hours as needed. For migraine headaches      . promethazine (PHENERGAN) 25 MG tablet Take 25 mg by mouth every 6 (six) hours as needed. For nausea      . warfarin (COUMADIN) 5 MG tablet Take 2.5-5 mg by mouth at bedtime. 0.5 half tab (2.5 mg total) Tues, Thurs, Sat, and Sun; All other day 1 tab (5 mg total)      . [DISCONTINUED] atorvastatin (LIPITOR) 20 MG tablet Take 20 mg by mouth at bedtime.       . [DISCONTINUED] lisinopril (PRINIVIL,ZESTRIL) 10 MG tablet Take 1 tablet (10 mg total) by mouth daily.  30 tablet  6  . [DISCONTINUED] metoprolol tartrate (LOPRESSOR) 50 MG tablet Take 1 tablet (50 mg total) by mouth 2 (two) times daily.  60 tablet  11  . atorvastatin (LIPITOR) 20 MG tablet Take 1 tablet (20 mg total) by mouth at bedtime.  90 tablet  3  . lisinopril (PRINIVIL,ZESTRIL) 10 MG tablet Take 1 tablet (10 mg total) by mouth daily.  90 tablet  3  . metoprolol (LOPRESSOR) 50 MG tablet Take 1 tablet (50 mg total) by mouth 2 (two) times daily.  180 tablet  3  . [DISCONTINUED] promethazine (PHENERGAN) 25 MG suppository Place 25 mg rectally every  6 (six) hours as needed. For nausea        BP 136/80  Pulse 56  Ht 5' (1.524 m)  Wt 198 lb (89.812 kg)  BMI 38.67 kg/m2 General: NAD, obese.  Neck: No JVD, no thyromegaly or thyroid nodule.  Lungs: Clear to auscultation bilaterally with normal respiratory effort. CV: Nondisplaced PMI.  Heart regular S1/S2, no S3/S4, no murmur.  1+ edema to knee on left with varicosities left lower leg.  No carotid bruit.  Normal pedal pulses.  Abdomen: Soft, nontender, no hepatosplenomegaly, no distention.  Neurologic: Alert and oriented x 3.  Psych: Normal affect. Extremities: No clubbing or cyanosis.   Assessment/Plan: 1.  Autonomic insufficiency Resolved. Possibly related to lumbar laminectomy (can be seen post-spinal surgery).   2. Exertional dyspnea Chronic.  No ischemia or infarction on prior  myoview. I suspect that the exertional dyspnea is primarily due to obesity and deconditioning.  She needs to try to get more active - I suggested more walking.  3. Hypertension  BP control is improved.  Continue current meds.  4. DVT Post-surgical.  She can stop warfarin in 1/14 and start ASA 81 mg daily at that time.   Marca Ancona 01/28/2012 2:17 PM

## 2012-01-28 NOTE — Patient Instructions (Signed)
When you stop coumadin take aspirin 81mg  daily.  Your physician wants you to follow-up in: 1 year with Dr Shirlee Latch. (November 2014).  You will receive a reminder letter in the mail two months in advance. If you don't receive a letter, please call our office to schedule the follow-up appointment.

## 2012-03-23 ENCOUNTER — Other Ambulatory Visit: Payer: Self-pay | Admitting: Family Medicine

## 2012-03-23 DIAGNOSIS — O2230 Deep phlebothrombosis in pregnancy, unspecified trimester: Secondary | ICD-10-CM

## 2012-03-23 DIAGNOSIS — I82409 Acute embolism and thrombosis of unspecified deep veins of unspecified lower extremity: Secondary | ICD-10-CM

## 2012-03-31 ENCOUNTER — Other Ambulatory Visit: Payer: Medicare Other

## 2012-04-05 ENCOUNTER — Other Ambulatory Visit: Payer: Medicare Other

## 2012-04-05 ENCOUNTER — Ambulatory Visit
Admission: RE | Admit: 2012-04-05 | Discharge: 2012-04-05 | Disposition: A | Discharge status: Home / Self Care | Payer: Medicare Other | Source: Ambulatory Visit | Attending: Family Medicine | Admitting: Family Medicine

## 2012-04-05 DIAGNOSIS — I82409 Acute embolism and thrombosis of unspecified deep veins of unspecified lower extremity: Secondary | ICD-10-CM

## 2012-04-08 ENCOUNTER — Ambulatory Visit: Payer: Medicare Other | Admitting: Gastroenterology

## 2012-04-13 ENCOUNTER — Ambulatory Visit: Payer: Medicare Other | Admitting: Gastroenterology

## 2012-04-14 ENCOUNTER — Telehealth: Payer: Self-pay | Admitting: *Deleted

## 2012-04-14 NOTE — Telephone Encounter (Signed)
Message copied by Florene Glen on Wed Apr 14, 2012 11:30 AM ------      Message from: Jarold Motto, DAVID R      Created: Tue Apr 13, 2012  2:53 PM       No show..do not refill meds

## 2012-04-14 NOTE — Telephone Encounter (Signed)
Noted . Pt cancelled her appt on 04/08/12 and was a NS yesterday.

## 2012-04-19 ENCOUNTER — Telehealth: Payer: Self-pay | Admitting: *Deleted

## 2012-04-19 NOTE — Telephone Encounter (Signed)
Patient walked in to get Nexium samples because of weather patient said that she will not be able to come tomorrow for her follow up visit.  I gave patient a weeks worth of samples and rescheduled her for next Tuesday.  I advised patient that she must keep this appointment for further refills because we have not seen her in 2 yrs.  Patient verbalized understanding.

## 2012-04-20 ENCOUNTER — Ambulatory Visit: Payer: Medicare Other | Admitting: Gastroenterology

## 2012-04-27 ENCOUNTER — Encounter: Payer: Self-pay | Admitting: Gastroenterology

## 2012-04-27 ENCOUNTER — Ambulatory Visit (INDEPENDENT_AMBULATORY_CARE_PROVIDER_SITE_OTHER): Payer: Medicare Other | Admitting: Gastroenterology

## 2012-04-27 VITALS — BP 120/72 | HR 76 | Ht 60.0 in | Wt 204.6 lb

## 2012-04-27 DIAGNOSIS — R05 Cough: Secondary | ICD-10-CM

## 2012-04-27 DIAGNOSIS — R053 Chronic cough: Secondary | ICD-10-CM

## 2012-04-27 DIAGNOSIS — R059 Cough, unspecified: Secondary | ICD-10-CM

## 2012-04-27 DIAGNOSIS — K219 Gastro-esophageal reflux disease without esophagitis: Secondary | ICD-10-CM

## 2012-04-27 DIAGNOSIS — E669 Obesity, unspecified: Secondary | ICD-10-CM

## 2012-04-27 MED ORDER — ESOMEPRAZOLE MAGNESIUM 40 MG PO CPDR: 40.0000 mg | DELAYED_RELEASE_CAPSULE | Freq: Every day | ORAL | Status: DC

## 2012-04-27 NOTE — Patient Instructions (Addendum)
We have sent the following medications to your mail order pharmacy for you: Nexium in 90 day supply. Samples of Nexium have been given until you receive your prescription by mail.

## 2012-04-27 NOTE — Progress Notes (Signed)
This is a complicated 72 year old Caucasian female with chronic GERD doing well on daily Nexium therapy.  She has had over the last year had a nonproductive chronic cough and been on multiple antibiotics and inhalers.  She questions if her pulmonary symptoms are not causing her acid reflux to be worsen.  She's had thorough GI workup which have been unremarkable except for nonerosive GERD.  There has been no evidence of esophageal motility disorder, her gut obstruction.  She is on B12 replacement therapy.  There no symptoms of Raynaud's phenomenon or collagen vascular disease.  In the past, she was on twice a day Nexium, but does not feel that she needs that at this time.  Her medical care has been complicated by back surgery with what sounds like deep vein thromboses requiring Coumadin therapy over the last 6 months.  Apparently evaluation for recurrent pulmonary emboli has been negative.  She has recently discontinued Coumadin.  She also has chronic migraine headaches and uses Reglan when necessary.  There is no history of known hepatobiliary or lower gastrointestinal issues at this time.  She is up-to-date on her endoscopic and colonoscopy exams.   Current Medications, Allergies, Past Medical History, Past Surgical History, Family History and Social History were reviewed in [employer] record.  ROS: All systems were reviewed and are negative unless otherwise stated in the HPI.          Physical Exam: Obese female in no acute distress.  I cannot appreciate stigmata of chronic liver disease.  Blood pressure 120/72, pulse 76 and regular, and weight 204 pounds the BMI of 39.6.  Oxygen saturation 98% on room air.  Chest is entirely clear to percussion and auscultation.  She is in a regular rhythm without murmurs gallops or rubs.  Her abdomen is obese without definite organomegaly, masses or tenderness.  Bowel sounds are normal.  She has TED stockings, but there is no evidence of  active phlebitis.  Mental status is normal.    Assessment and Plan: Chronic nonerosive GERD doing well on daily Nexium therapy.  I've renewed her prescription reviewed antireflux regime with her.  This patient does not desire any further GI evaluation at this time.  I've suggested high-resolution esophageal manometry should her symptoms worsen.  I've also suggested possible ENT referral, but will leave this up to her primary care physician.  Her pulmonary exam today seems unremarkable. No diagnosis found.

## 2013-01-27 ENCOUNTER — Ambulatory Visit (INDEPENDENT_AMBULATORY_CARE_PROVIDER_SITE_OTHER): Payer: Medicare Other | Admitting: Cardiology

## 2013-01-27 ENCOUNTER — Encounter: Payer: Self-pay | Admitting: *Deleted

## 2013-01-27 ENCOUNTER — Encounter: Payer: Self-pay | Admitting: Cardiology

## 2013-01-27 VITALS — BP 126/74 | Ht 60.0 in | Wt 211.0 lb

## 2013-01-27 DIAGNOSIS — R0600 Dyspnea, unspecified: Secondary | ICD-10-CM

## 2013-01-27 DIAGNOSIS — R002 Palpitations: Secondary | ICD-10-CM

## 2013-01-27 DIAGNOSIS — R0609 Other forms of dyspnea: Secondary | ICD-10-CM

## 2013-01-27 DIAGNOSIS — I739 Peripheral vascular disease, unspecified: Secondary | ICD-10-CM

## 2013-01-27 DIAGNOSIS — M79609 Pain in unspecified limb: Secondary | ICD-10-CM

## 2013-01-27 DIAGNOSIS — R0989 Other specified symptoms and signs involving the circulatory and respiratory systems: Secondary | ICD-10-CM

## 2013-01-27 DIAGNOSIS — I82409 Acute embolism and thrombosis of unspecified deep veins of unspecified lower extremity: Secondary | ICD-10-CM

## 2013-01-27 DIAGNOSIS — I951 Orthostatic hypotension: Secondary | ICD-10-CM

## 2013-01-27 MED ORDER — METOPROLOL TARTRATE 50 MG PO TABS: 50.0000 mg | ORAL_TABLET | Freq: Two times a day (BID) | ORAL | Status: DC

## 2013-01-27 MED ORDER — LISINOPRIL 10 MG PO TABS: 10.0000 mg | ORAL_TABLET | Freq: Every day | ORAL | Status: DC

## 2013-01-27 MED ORDER — ATORVASTATIN CALCIUM 20 MG PO TABS: 20.0000 mg | ORAL_TABLET | Freq: Every day | ORAL | Status: DC

## 2013-01-27 MED ORDER — HYDROCHLOROTHIAZIDE 25 MG PO TABS: 25.0000 mg | ORAL_TABLET | Freq: Every day | ORAL | Status: DC

## 2013-01-27 NOTE — Patient Instructions (Signed)
Your physician has requested that you have a lower extremity arterial duplex. This test is an ultrasound of the arteries in the legs. It looks at arterial blood flow in the legs. Allow one hour for Lower Arterial scans. There are no restrictions or special instructions  Your physician wants you to follow-up in: 1 year with Dr Shirlee Latch. ( November 2015).  You will receive a reminder letter in the mail two months in advance. If you don't receive a letter, please call our office to schedule the follow-up appointment.

## 2013-01-28 DIAGNOSIS — M79606 Pain in leg, unspecified: Secondary | ICD-10-CM | POA: Insufficient documentation

## 2013-01-28 NOTE — Progress Notes (Signed)
Patient ID: [name], female   DOB: 04/19/1940, 72 y.o.   MRN: [id number] PCP: Dr. Barbaraann Barthel  72 yo with history of HTN, GERD, autonomic insufficiency, and migraines returns for cardiology followup.  She had a lumbar laminectomy in 6/13.  After this, she had a left lower leg DVT.  Also after the operation, she developed orthostatic symptoms manifested predominantly as postural tachycardia. HCTZ and diltiazem were stopped and she was left on metoprolol.  She later tolerated the addition of lisinopril for HTN.  Since I last saw her, she has done reasonably well.  She is short of breath walking longer distances (over 100 yards or so).  She has low back pain and has not been able to get much exercise.  Weight is up 13 lbs.  No orthopnea or PND.  She is wearing compression stockings.  BP is controlled.  She does report pain in her feet and calves bilaterally, not necessarily with exertion.   Labs (2/13): BNP 35 Labs (7/13): K 3.8, creatinine 0.86 Labs (10/13): K 4, creatinine 0.8  PMH: 1. GERD with esophageal stricture and hiatal hernia.   2. Low back pain: s/p lumbar laminectomy in 6/13.  3. Hyperlipidemia 4. HTN 5. Migraines.  6. Echo (7/13): EF 65-70%, mild LVH  7. Scoliosis 8. B12 deficiency 9. Osteoarthritis 10. Diverticulitis with surgery in 2001 11. Atypical chest pain: Lexiscan myoview (2/13) with EF 80%, breast attenuation, no evidence for ischemia or infarction.  12. Palpitations: Holter (2/13) with occasional PACs.  13. Carotid dopplers (7/13) with minimal disease.  14. Autonomic insufficiency: post-lumbar laminectomy in 6/13.  Predominantly manifested as postural tachycardia.  15. DVT 6/13 post-surgery.   SH: Lives in [place], married, nonsmoker.   FH: Father with "heart trouble"  ROS: All systems reviewed and negative except as per HPI.     Current Outpatient Prescriptions  Medication Sig Dispense Refill  . atorvastatin (LIPITOR) 20 MG tablet Take 1 tablet (20 mg  total) by mouth at bedtime.  90 tablet  3  . Cyanocobalamin (VITAMIN B-12 IJ) Inject as directed every 30 (thirty) days.      [name] esomeprazole (NEXIUM) 40 MG capsule Take 1 capsule (40 mg total) by mouth daily before breakfast.  90 capsule  3  . hydrochlorothiazide (HYDRODIURIL) 25 MG tablet Take 1 tablet (25 mg total) by mouth daily.  90 tablet  3  . lisinopril (PRINIVIL,ZESTRIL) 10 MG tablet Take 1 tablet (10 mg total) by mouth daily.  90 tablet  3  . metoCLOPramide (REGLAN) 10 MG tablet Take 10 mg by mouth 2 (two) times daily as needed. For migraine headache      . metoprolol (LOPRESSOR) 50 MG tablet Take 1 tablet (50 mg total) by mouth 2 (two) times daily.  180 tablet  3  . oxyCODONE-acetaminophen (TYLOX) 5-500 MG per capsule Take 1 capsule by mouth every 4 (four) hours as needed. For migraine headaches      . promethazine (PHENERGAN) 25 MG tablet Take 25 mg by mouth every 6 (six) hours as needed. For nausea       No current facility-administered medications for this visit.    BP 126/74  Ht 5' (1.524 m)  Wt 95.709 kg (211 lb)  BMI 41.21 kg/m2 General: NAD, obese.  Neck: No JVD, no thyromegaly or thyroid nodule.  Lungs: Clear to auscultation bilaterally with normal respiratory effort. CV: Nondisplaced PMI.  Heart regular S1/S2, no S3/S4, no murmur.  1+ edema 1/3 to knees bilaterally L>R with varicosities left  lower leg.  No carotid bruit.  Unable to feel PT pulses bilaterally, can feel DP pulses.  Abdomen: Soft, nontender, no hepatosplenomegaly, no distention.  Neurologic: Alert and oriented x 3.  Psych: Normal affect. Extremities: No clubbing or cyanosis.   Assessment/Plan: 1. Autonomic insufficiency Resolved. Possibly related to lumbar laminectomy (can be seen post-spinal surgery).   2. Exertional dyspnea Chronic.  No ischemia or infarction on prior  myoview. I suspect that the exertional dyspnea is primarily due to obesity and deconditioning.  It is hard for her to exercise more due  to low back pain/spinal stenosis.  3. Hypertension  BP control is improved.  Continue current meds.  4. DVT Post-surgical.  She completed warfarin and is now on ASA only.  5. Foot/leg pain Pulses are difficult to palpate.  I will obtain ABIs to assess for PAD.   Marca Ancona 01/28/2013 8:12 PM

## 2013-02-04 ENCOUNTER — Ambulatory Visit (HOSPITAL_COMMUNITY): Payer: Medicare Other | Attending: Cardiology

## 2013-02-04 DIAGNOSIS — R002 Palpitations: Secondary | ICD-10-CM | POA: Insufficient documentation

## 2013-02-04 DIAGNOSIS — I739 Peripheral vascular disease, unspecified: Secondary | ICD-10-CM | POA: Insufficient documentation

## 2013-03-01 IMAGING — US US ABDOMEN COMPLETE
1 series · 13 of 25 positions shown · non-contrast
Comparison: 05/12/2007 ultrasound

CLINICAL DATA: 71-year-old female with abdominal pain and nausea.
History of lumbar surgery 3 weeks ago.

ABDOMINAL ULTRASOUND COMPLETE

[Series 1: us abdomen complete · 0.32mm/px · 13 of 81 slices shown]
[im 1/81]
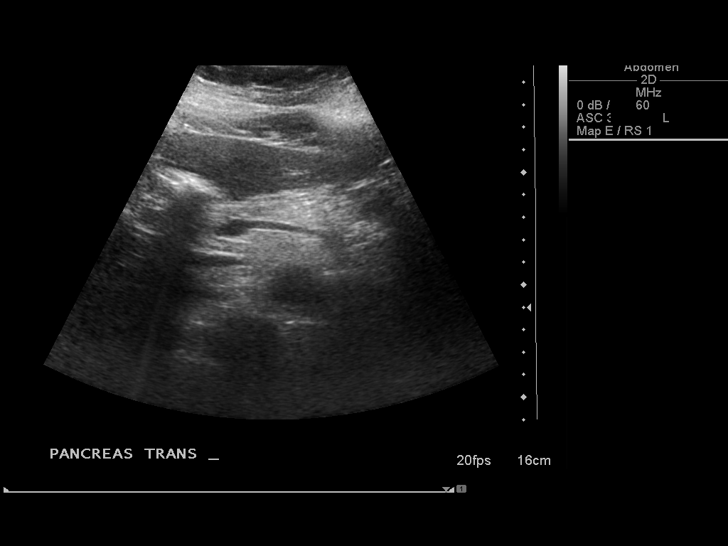
[im 7/81]
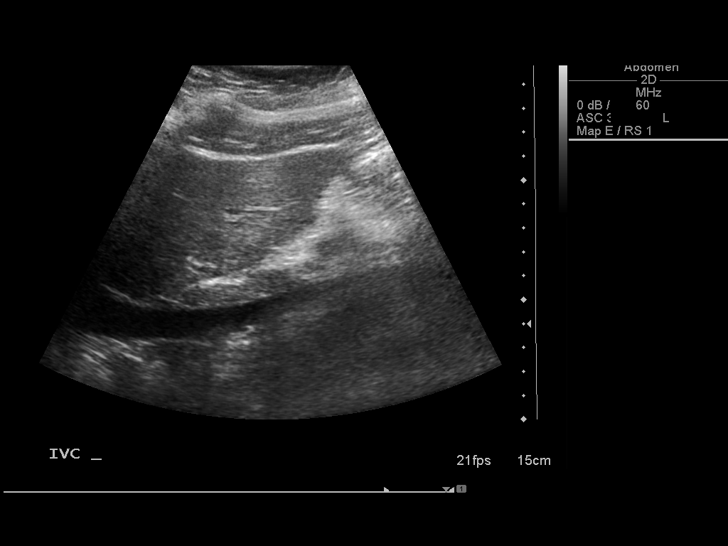
[im 14/81]
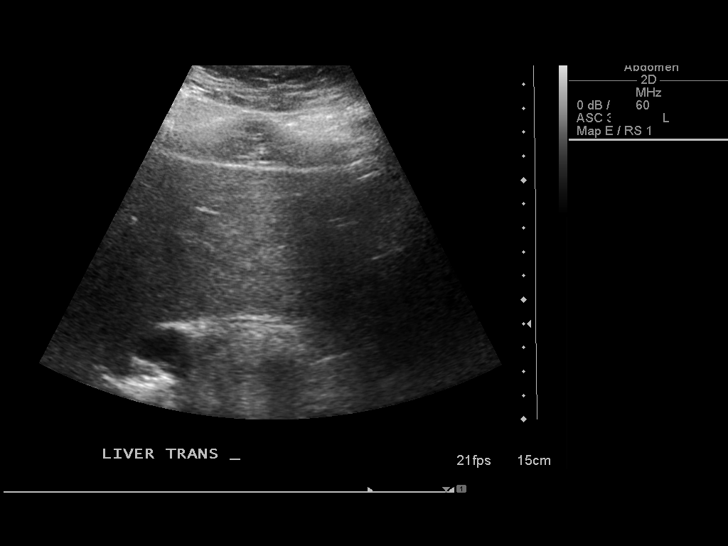
[im 21/81]
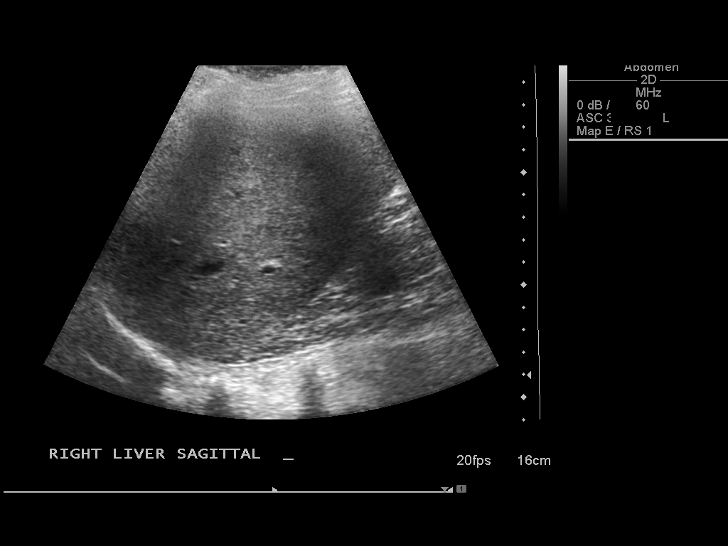
[im 27/81]
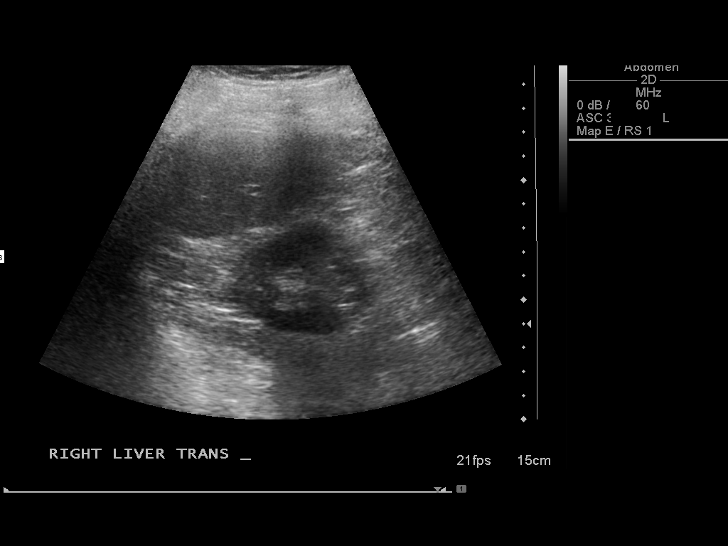
[im 34/81]
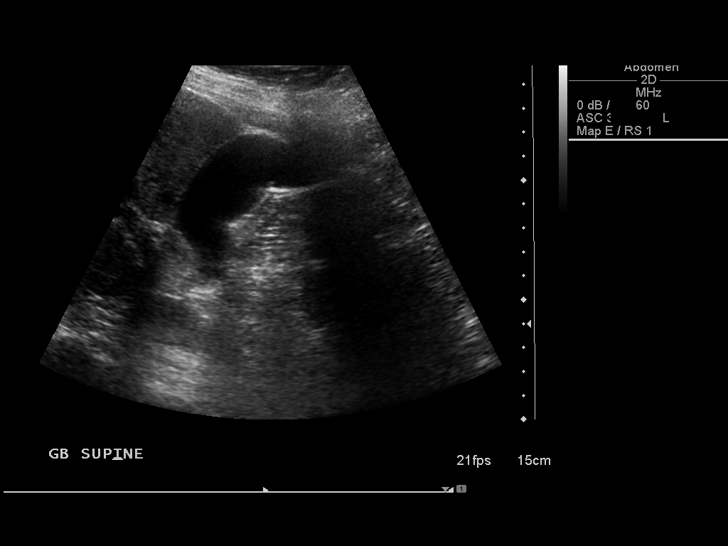
[im 41/81]
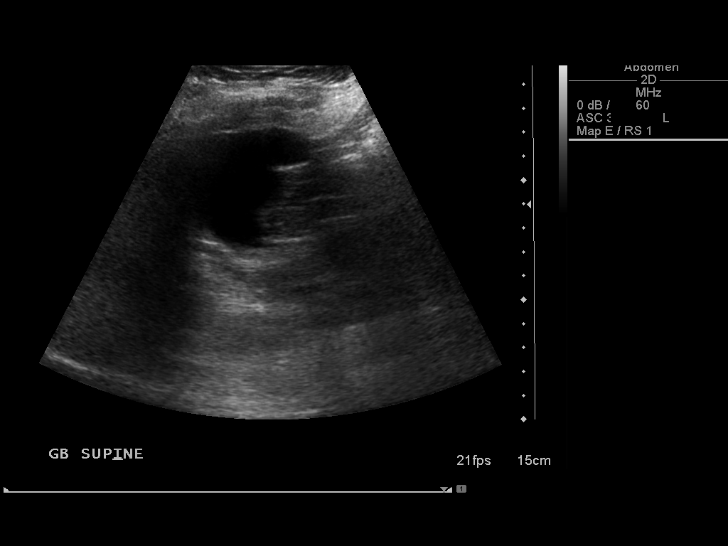
[im 47/81]
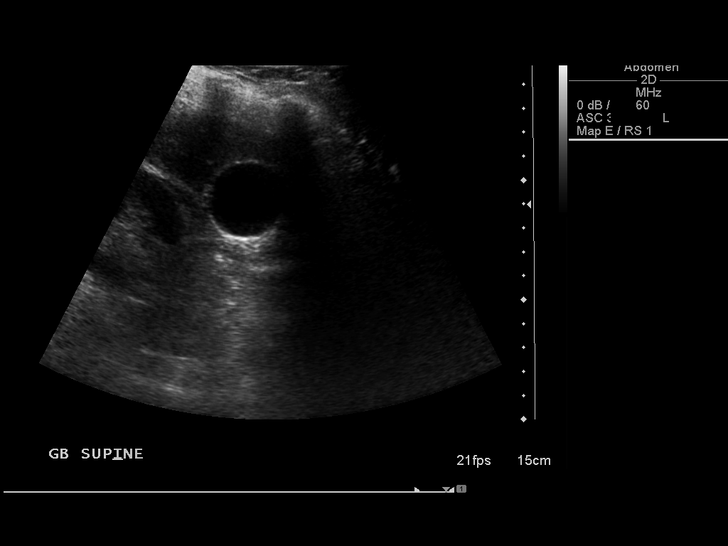
[im 54/81]
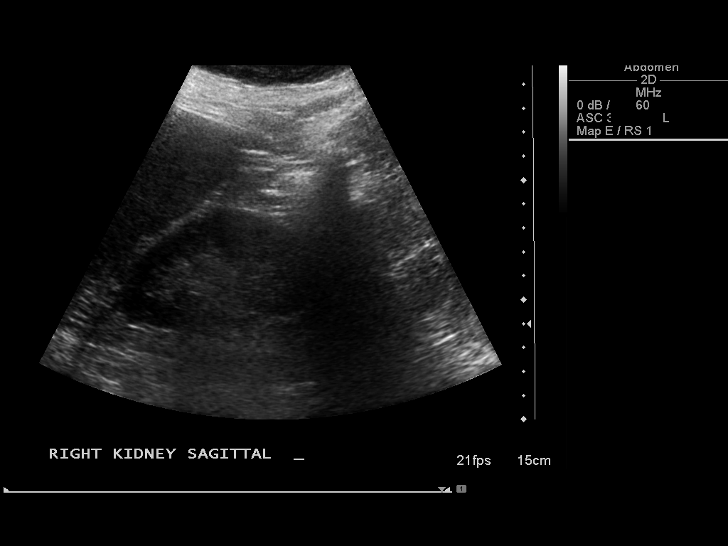
[im 61/81]
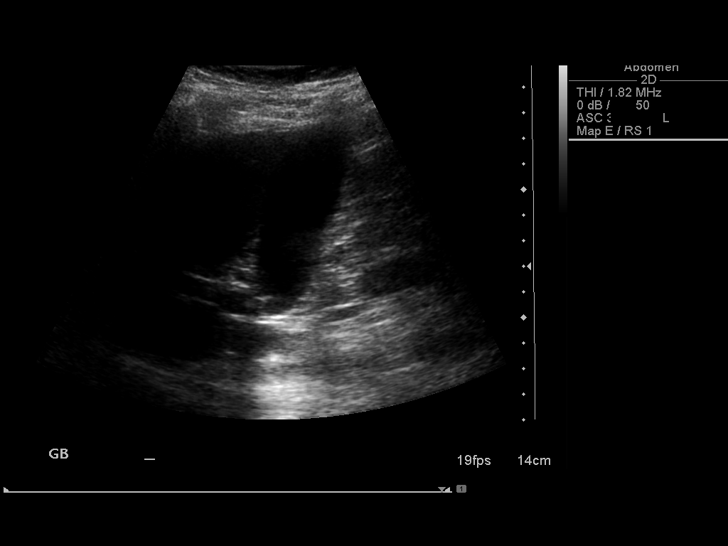
[im 67/81]
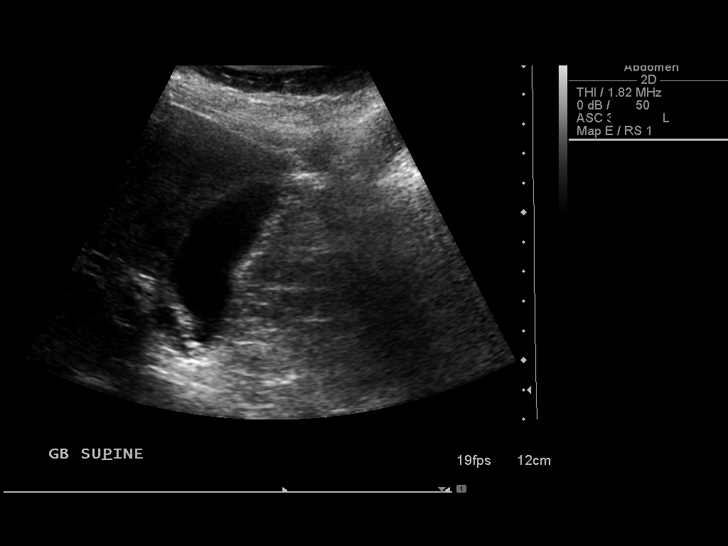
[im 74/81]
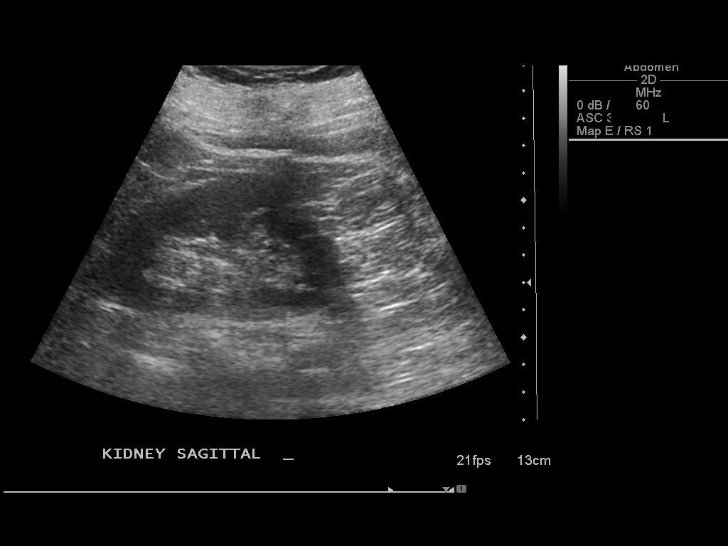
[im 81/81]
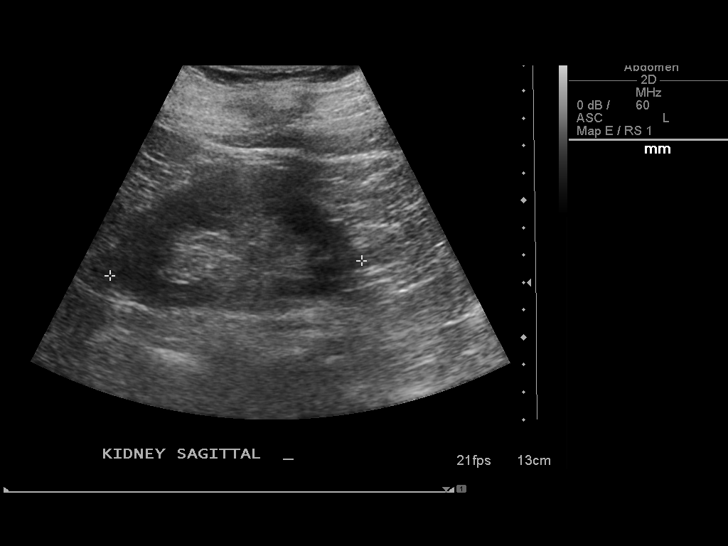

[13 of 25 positions shown; findings below may reference images not displayed]

FINDINGS: Gallbladder:  A 5 mm gallbladder polyp is present.  There is no
evidence of gallstones, gallbladder wall thickening, or
pericholecystic fluid.

Common Bile Duct:  There is no evidence of intrahepatic or
extrahepatic biliary dilation. The CBD measures 5.5 mm in greatest
diameter.

Liver:  The liver is within normal limits in parenchymal
echogenicity. No focal abnormalities are identified.

IVC:  Appears normal.

Pancreas:  Although the pancreas is difficult to visualize in its
entirety, no focal pancreatic abnormality is identified.

Spleen:  Within normal limits in size and echotexture.

Right kidney:  The right kidney is normal in size and parenchymal
echogenicity.  There is no evidence of solid mass, hydronephrosis
or definite renal calculi.  The right kidney measures 9.2 cm.

Left kidney:  The left kidney is normal in size and parenchymal
echogenicity.  There is no evidence of solid mass, hydronephrosis
or definite renal calculi.   The left kidney measures 9.2 cm.

Abdominal Aorta:  No abdominal aortic aneurysm identified.

There is no evidence of ascites.
IMPRESSION: 5 mm gallbladder polyp, otherwise negative abdominal
ultrasound.

## 2013-03-25 ENCOUNTER — Telehealth: Payer: Self-pay | Admitting: Gastroenterology

## 2013-03-25 NOTE — Telephone Encounter (Signed)
CALLED PATIENT BACK, NO NEXIUM SAMPLES AVAILABLE LMOM FOR PATIENT TO CALL BACK

## 2013-03-31 ENCOUNTER — Encounter: Payer: Self-pay | Admitting: Gastroenterology

## 2013-03-31 ENCOUNTER — Ambulatory Visit (INDEPENDENT_AMBULATORY_CARE_PROVIDER_SITE_OTHER): Payer: Medicare Other | Admitting: Gastroenterology

## 2013-03-31 VITALS — BP 136/70 | HR 68 | Ht 60.0 in | Wt 208.0 lb

## 2013-03-31 DIAGNOSIS — K219 Gastro-esophageal reflux disease without esophagitis: Secondary | ICD-10-CM

## 2013-03-31 DIAGNOSIS — K5730 Diverticulosis of large intestine without perforation or abscess without bleeding: Secondary | ICD-10-CM

## 2013-03-31 MED ORDER — ESOMEPRAZOLE MAGNESIUM 40 MG PO CPDR: 40.0000 mg | DELAYED_RELEASE_CAPSULE | Freq: Two times a day (BID) | ORAL | Status: DC

## 2013-03-31 NOTE — Patient Instructions (Signed)
Please make a follow up appointment in four months with Dr. Carlean Purl   New prescription for Nexium, twice daily was sent to your pharmacy

## 2013-03-31 NOTE — Progress Notes (Signed)
This is a 73 year old Caucasian female with chronic GERD doing well on daily Nexium 40 mg.  She has some breakthrough symptoms at nighttime, she occasionally uses twice a day Nexium.  She denies dysphagia, lower GI or hepatobiliary complaints.  She is up-to-date on endoscopy and colonoscopy.  She does use metoclopramide when necessary for headaches.  She also has B12 deficiency and is on parenteral replacement therapy.  She comes in today mostly for her drug refills.  Current Medications, Allergies, Past Medical History, Past Surgical History, Family History and Social History were reviewed in [employer] record.  ROS: All systems were reviewed and are negative unless otherwise stated in the HPI.          Physical Exam: Blood pressure 130/70, pulse 68 and regular and weight 208 with a BMI of 40.62.  General physical exam was not performed.    Assessment and Plan: Chronic GERD an obese elderly patient who is actually doing well on daily Nexium.  Have renewed her prescription we'll care for twice a day dosage if needed.  She wishes to establish in the future with Dr. Carlean Purl , bicarbonate I would see him in 4 months time.  She is up-to-date her colonoscopy exams.

## 2013-05-30 ENCOUNTER — Telehealth: Payer: Self-pay | Admitting: *Deleted

## 2013-05-30 MED ORDER — ESOMEPRAZOLE MAGNESIUM 40 MG PO CPDR: 40.0000 mg | DELAYED_RELEASE_CAPSULE | Freq: Two times a day (BID) | ORAL | Status: DC

## 2013-05-30 NOTE — Telephone Encounter (Signed)
Yes for 1 year 

## 2013-05-30 NOTE — Telephone Encounter (Signed)
Patient was last seen with Dr. Sharlett Iles on 03-31-2013. Patient requested to see you in the future. Is it ok to refill Nexium?

## 2013-11-13 ENCOUNTER — Emergency Department (HOSPITAL_COMMUNITY)
Admission: EM | Admit: 2013-11-13 | Discharge: 2013-11-13 | Disposition: A | Discharge status: Home / Self Care | Payer: Medicare Other | Attending: Emergency Medicine | Admitting: Emergency Medicine

## 2013-11-13 ENCOUNTER — Encounter (HOSPITAL_COMMUNITY): Payer: Self-pay | Admitting: Emergency Medicine

## 2013-11-13 DIAGNOSIS — G8929 Other chronic pain: Secondary | ICD-10-CM | POA: Diagnosis not present

## 2013-11-13 DIAGNOSIS — Z860100 Personal history of colon polyps, unspecified: Secondary | ICD-10-CM | POA: Insufficient documentation

## 2013-11-13 DIAGNOSIS — E669 Obesity, unspecified: Secondary | ICD-10-CM | POA: Diagnosis not present

## 2013-11-13 DIAGNOSIS — G43909 Migraine, unspecified, not intractable, without status migrainosus: Secondary | ICD-10-CM | POA: Insufficient documentation

## 2013-11-13 DIAGNOSIS — Z7982 Long term (current) use of aspirin: Secondary | ICD-10-CM | POA: Diagnosis not present

## 2013-11-13 DIAGNOSIS — R110 Nausea: Secondary | ICD-10-CM | POA: Insufficient documentation

## 2013-11-13 DIAGNOSIS — IMO0002 Reserved for concepts with insufficient information to code with codable children: Secondary | ICD-10-CM | POA: Diagnosis not present

## 2013-11-13 DIAGNOSIS — I10 Essential (primary) hypertension: Secondary | ICD-10-CM | POA: Diagnosis not present

## 2013-11-13 DIAGNOSIS — Z8701 Personal history of pneumonia (recurrent): Secondary | ICD-10-CM | POA: Diagnosis not present

## 2013-11-13 DIAGNOSIS — Z79899 Other long term (current) drug therapy: Secondary | ICD-10-CM | POA: Diagnosis not present

## 2013-11-13 DIAGNOSIS — Z86718 Personal history of other venous thrombosis and embolism: Secondary | ICD-10-CM | POA: Insufficient documentation

## 2013-11-13 DIAGNOSIS — N390 Urinary tract infection, site not specified: Secondary | ICD-10-CM | POA: Diagnosis not present

## 2013-11-13 DIAGNOSIS — I499 Cardiac arrhythmia, unspecified: Secondary | ICD-10-CM | POA: Diagnosis not present

## 2013-11-13 DIAGNOSIS — R300 Dysuria: Secondary | ICD-10-CM | POA: Insufficient documentation

## 2013-11-13 DIAGNOSIS — K219 Gastro-esophageal reflux disease without esophagitis: Secondary | ICD-10-CM | POA: Insufficient documentation

## 2013-11-13 DIAGNOSIS — E785 Hyperlipidemia, unspecified: Secondary | ICD-10-CM | POA: Diagnosis not present

## 2013-11-13 DIAGNOSIS — M069 Rheumatoid arthritis, unspecified: Secondary | ICD-10-CM | POA: Insufficient documentation

## 2013-11-13 DIAGNOSIS — R319 Hematuria, unspecified: Secondary | ICD-10-CM

## 2013-11-13 LAB — LIPASE, BLOOD: Lipase: 28 U/L (ref 11–59)

## 2013-11-13 LAB — CBC WITH DIFFERENTIAL/PLATELET
BASOS ABS: 0 10*3/uL (ref 0.0–0.1)
BASOS PCT: 1 % (ref 0–1)
Eosinophils Absolute: 0.1 10*3/uL (ref 0.0–0.7)
Eosinophils Relative: 1 % (ref 0–5)
HCT: 41.3 % (ref 36.0–46.0)
HEMOGLOBIN: 13.8 g/dL (ref 12.0–15.0)
Lymphocytes Relative: 32 % (ref 12–46)
Lymphs Abs: 2.4 10*3/uL (ref 0.7–4.0)
MCH: 27.8 pg (ref 26.0–34.0)
MCHC: 33.4 g/dL (ref 30.0–36.0)
MCV: 83.3 fL (ref 78.0–100.0)
MONOS PCT: 8 % (ref 3–12)
Monocytes Absolute: 0.6 10*3/uL (ref 0.1–1.0)
NEUTROS ABS: 4.4 10*3/uL (ref 1.7–7.7)
Neutrophils Relative %: 58 % (ref 43–77)
Platelets: 208 10*3/uL (ref 150–400)
RBC: 4.96 MIL/uL (ref 3.87–5.11)
RDW: 13.7 % (ref 11.5–15.5)
WBC: 7.5 10*3/uL (ref 4.0–10.5)

## 2013-11-13 LAB — COMPREHENSIVE METABOLIC PANEL
ALBUMIN: 4 g/dL (ref 3.5–5.2)
ALK PHOS: 57 U/L (ref 39–117)
ALT: 13 U/L (ref 0–35)
AST: 17 U/L (ref 0–37)
Anion gap: 15 (ref 5–15)
BILIRUBIN TOTAL: 1.5 mg/dL — AB (ref 0.3–1.2)
BUN: 14 mg/dL (ref 6–23)
CHLORIDE: 101 meq/L (ref 96–112)
CO2: 24 mEq/L (ref 19–32)
Calcium: 9.6 mg/dL (ref 8.4–10.5)
Creatinine, Ser: 0.92 mg/dL (ref 0.50–1.10)
GFR calc Af Amer: 70 mL/min — ABNORMAL LOW (ref >90)
GFR calc non Af Amer: 61 mL/min — ABNORMAL LOW (ref >90)
Glucose, Bld: 98 mg/dL (ref 70–99)
POTASSIUM: 4 meq/L (ref 3.7–5.3)
SODIUM: 140 meq/L (ref 137–147)
Total Protein: 7.1 g/dL (ref 6.0–8.3)

## 2013-11-13 LAB — URINE MICROSCOPIC-ADD ON

## 2013-11-13 LAB — URINALYSIS, ROUTINE W REFLEX MICROSCOPIC
BILIRUBIN URINE: NEGATIVE
Glucose, UA: NEGATIVE mg/dL
Ketones, ur: NEGATIVE mg/dL
Nitrite: NEGATIVE
Protein, ur: NEGATIVE mg/dL
Specific Gravity, Urine: 1.014 (ref 1.005–1.030)
Urobilinogen, UA: 1 mg/dL (ref 0.0–1.0)
pH: 6.5 (ref 5.0–8.0)

## 2013-11-13 MED ORDER — CEPHALEXIN 500 MG PO CAPS: 500.0000 mg | ORAL_CAPSULE | Freq: Four times a day (QID) | ORAL | Status: DC

## 2013-11-13 MED ORDER — DEXTROSE 5 % IV SOLN
1.0000 g | Freq: Once | INTRAVENOUS | Status: AC
  Administered 2013-11-13: 1 g via INTRAVENOUS
  Filled 2013-11-13: qty 10

## 2013-11-13 NOTE — ED Notes (Signed)
Pt states that she has been having low back pain and dysuria.  Pt states that last night she began having hematuria.  Voided 4 times w/ blood.  States it has "slacked off" a little since the bleeding started.  Denies vomiting or diarrhea.

## 2013-11-13 NOTE — ED Provider Notes (Signed)
CSN: [id number]     Arrival date & time 11/13/13  1109 History   First MD Initiated Contact with Patient 11/13/13 1224     Chief Complaint  Patient presents with  . Dysuria  . Back Pain     (Consider location/radiation/quality/duration/timing/severity/associated sxs/prior Treatment) Patient is a 73 y.o. female presenting with dysuria and back pain.  Dysuria Pain quality:  Burning Pain severity:  Moderate Onset quality:  Gradual Duration:  1 day Timing:  Constant Progression:  Unchanged Chronicity:  New Recent urinary tract infections: no   Relieved by:  Nothing Worsened by:  Nothing tried Ineffective treatments:  None tried Urinary symptoms: hematuria   Associated symptoms: nausea   Associated symptoms: no abdominal pain, no fever and no vomiting   Associated symptoms comment:  Intermittent left low back pain for several weeks  Back Pain Associated symptoms: dysuria   Associated symptoms: no abdominal pain, no chest pain, no fever and no headaches     Past Medical History  Diagnosis Date  . Obesity, unspecified   . Stricture and stenosis of esophagus   . Diverticulosis of colon (without mention of hemorrhage)   . Personal history of colonic polyps 05/31/2007    adenomatous polyp  . Esophageal reflux   . Unspecified gastritis and gastroduodenitis without mention of hemorrhage   . Hiatal hernia   . Scoliosis   . Hyperlipidemia   . Hypertension   . Osteoporosis   . Family history of malignant neoplasm of gastrointestinal tract   . Dysrhythmia     notes in epic  . DVT of leg (deep venous thrombosis) 09/2011    LLE  . Pneumonia     "3 times at least" (09/24/11)  . Migraines 09/24/11    "often recently;  more controlled now"  . Rheumatoid arthritis(714.0)   . Arthritis     "got alot; especially in my spine"  . Fibromyalgia     "dx'd w/it when it first came out"  . Chronic back pain     "all of it"  . Palpitations     event monitor 8/13: NSR, Sinus brady, Sinus  Tachy, PAC   Past Surgical History  Procedure Laterality Date  . Sigmoid resection / rectopexy  09/1999    Dr Rosana Hoes  . Salivary gland surgery  ~ 1980    1 removed  . Shoulder surgery  2008    right shoulder and collar bone  . Colonoscopy  2009  . Upper gastrointestinal endoscopy    . Breast surgery      cyst lanced and drainded  . Tumor removal  1960's    left leg  . Lumbar laminectomy/decompression microdiscectomy  09/03/2011    Procedure: LUMBAR LAMINECTOMY/DECOMPRESSION MICRODISCECTOMY 2 LEVELS;  Surgeon: Elaina Hoops, MD;  Location: [facility];  Service: Neurosurgery;  Laterality: Left;  Left Lumbar Three-Four, Lumbar Four-Five Decompressive Laminectomy  . Exploratory laparotomy  04/2000    "for ruptured colon"  . Esophageal dilation      "several times"  . Trigger finger release  2004    left thumb  . Breast cyst incision and drainage  2012    left; "golf-ball sized"  . Back surgery    . Hematoma evacuation  09/2011    LLE  . Colon surgery     Family History  Problem Relation Age of Onset  . Pancreatic cancer Sister   . Stroke Mother   . Hypertension Mother   . Stroke Father   . Hypertension Father   .  Diabetes Father   . Diabetes Sister     x 2   History  Substance Use Topics  . Smoking status: Never Smoker   . Smokeless tobacco: Never Used  . Alcohol Use: No   OB History   Grav Para Term Preterm Abortions TAB SAB Ect Mult Living                 Review of Systems  Constitutional: Negative for fever and fatigue.  HENT: Negative for congestion and drooling.   Eyes: Negative for pain.  Respiratory: Negative for cough and shortness of breath.   Cardiovascular: Negative for chest pain.  Gastrointestinal: Positive for nausea. Negative for vomiting, abdominal pain and diarrhea.  Genitourinary: Positive for dysuria. Negative for hematuria.  Musculoskeletal: Positive for back pain. Negative for gait problem and neck pain.  Skin: Negative for color change.   Neurological: Negative for dizziness and headaches.  Hematological: Negative for adenopathy.  Psychiatric/Behavioral: Negative for behavioral problems.  All other systems reviewed and are negative.     Allergies  Review of patient's allergies indicates no known allergies.  Home Medications   Prior to Admission medications   Medication Sig Start Date End Date Taking? Authorizing Provider  aspirin EC 81 MG tablet Take 81 mg by mouth daily.   Yes Historical Provider, MD  atorvastatin (LIPITOR) 20 MG tablet Take 1 tablet (20 mg total) by mouth at bedtime. 01/27/13  Yes Larey Dresser, MD  Cyanocobalamin (VITAMIN B-12 IJ) Inject as directed every 30 (thirty) days.   Yes Historical Provider, MD  esomeprazole (NEXIUM) 40 MG capsule Take 1 capsule (40 mg total) by mouth 2 (two) times daily before a meal. 05/30/13  Yes Gatha Mayer, MD  fluticasone (FLONASE) 50 MCG/ACT nasal spray Place 1 spray into both nostrils daily.   Yes Historical Provider, MD  hydrochlorothiazide (HYDRODIURIL) 25 MG tablet Take 1 tablet (25 mg total) by mouth daily. 01/27/13  Yes Larey Dresser, MD  lisinopril (PRINIVIL,ZESTRIL) 10 MG tablet Take 1 tablet (10 mg total) by mouth daily. 01/27/13  Yes Larey Dresser, MD  metoCLOPramide (REGLAN) 10 MG tablet Take 10 mg by mouth 2 (two) times daily as needed. For migraine headache   Yes Historical Provider, MD  metoprolol (LOPRESSOR) 50 MG tablet Take 1 tablet (50 mg total) by mouth 2 (two) times daily. 01/27/13 01/27/14 Yes Larey Dresser, MD  oxyCODONE-acetaminophen (TYLOX) 5-500 MG per capsule Take 1 capsule by mouth every 4 (four) hours as needed. For migraine headaches   Yes Historical Provider, MD   BP 155/60  Pulse 63  Temp(Src) 97.9 F (36.6 C) (Oral)  Resp 20  SpO2 97% Physical Exam  Nursing note and vitals reviewed. Constitutional: She is oriented to person, place, and time. She appears well-developed and well-nourished.  HENT:  Head: Normocephalic and  atraumatic.  Mouth/Throat: Oropharynx is clear and moist. No oropharyngeal exudate.  Eyes: Conjunctivae and EOM are normal. Pupils are equal, round, and reactive to light.  Neck: Normal range of motion. Neck supple.  Cardiovascular: Normal rate, regular rhythm, normal heart sounds and intact distal pulses.  Exam reveals no gallop and no friction rub.   No murmur heard. Pulmonary/Chest: Effort normal and breath sounds normal. No respiratory distress. She has no wheezes.  Abdominal: Soft. Bowel sounds are normal. There is tenderness (mild suprapubic ttp). There is no rebound and no guarding.  Musculoskeletal: Normal range of motion. She exhibits no edema and no tenderness.  No CVA ttp  Neurological: She is alert and oriented to person, place, and time.  Skin: Skin is warm and dry.  Psychiatric: She has a normal mood and affect. Her behavior is normal.    ED Course  Procedures (including critical care time) Labs Review Labs Reviewed  COMPREHENSIVE METABOLIC PANEL - Abnormal; Notable for the following:    Total Bilirubin 1.5 (*)    GFR calc non Af Amer 61 (*)    GFR calc Af Amer 70 (*)    All other components within normal limits  URINALYSIS, ROUTINE W REFLEX MICROSCOPIC - Abnormal; Notable for the following:    APPearance CLOUDY (*)    Hgb urine dipstick MODERATE (*)    Leukocytes, UA SMALL (*)    All other components within normal limits  URINE MICROSCOPIC-ADD ON - Abnormal; Notable for the following:    Squamous Epithelial / LPF MANY (*)    All other components within normal limits  URINE CULTURE  CBC WITH DIFFERENTIAL  LIPASE, BLOOD    Imaging Review No results found.   EKG Interpretation None      MDM   Final diagnoses:  UTI (lower urinary tract infection)  Hematuria    12:50 PM 73 y.o. female who presents with 4 episodes of hematuria which occurred in the last 24 hours. She states that her urine has cleared in the last 4-6 hours. She notes some mild suprapubic  pain. She states that she has had some mild left lower back pain intermittently over the last few weeks and had been evaluated by her PCP and urology. She does not have this pain currently. She denies any fevers but does occasionally feel nauseous. She denies any vomiting or diarrhea. She is afebrile and vital signs are unremarkable here. Screening labs sent prior to my evaluation. Likely UTI.  2:49 PM: I interpreted/reviewed the labs and/or imaging which were non-contributory.  Pt did have some leuks and hgb in her UA. Will tx for UTI given suprapubic pain. It is possible that pt may have passed a kidney stone as well given her brief hematuria and back pain over the last few weeks which has resolved. VS remain unremarkable and pt is comfortable and well appearing.  I have discussed the diagnosis/risks/treatment options with the patient and family and believe the pt to be eligible for discharge home to follow-up with her Urologist. We also discussed returning to the ED immediately if new or worsening sx occur. We discussed the sx which are most concerning (e.g., fever, return of bp, vomiting) that necessitate immediate return. Medications administered to the patient during their visit and any new prescriptions provided to the patient are listed below.  Medications given during this visit Medications  cefTRIAXone (ROCEPHIN) 1 g in dextrose 5 % 50 mL IVPB (0 g Intravenous Stopped 11/13/13 1444)    New Prescriptions   CEPHALEXIN (KEFLEX) 500 MG CAPSULE    Take 1 capsule (500 mg total) by mouth 4 (four) times daily.     Pamella Pert, MD 11/14/13 [phone]

## 2013-11-13 NOTE — ED Notes (Signed)
She states she felt urinary urgency yesterday evening; and the past few times she has voided she has had "some blood in my urine".  She recalls seeing her urology PA some two weeks ago for left flank area pain which they diagnosed as "muscle inflammation".  She is in no distress.

## 2013-11-13 NOTE — ED Notes (Signed)
Patient refused to allow this phlebotomist to obtain blood for labs. Patient requested the "vien team" RN Octavia Bruckner made aware

## 2013-11-15 LAB — URINE CULTURE: Colony Count: 30000

## 2013-11-17 NOTE — ED Notes (Signed)
Urine culture(+), currently treated with Cephalexin, OK per Katherine Roan, NP

## 2014-01-13 ENCOUNTER — Other Ambulatory Visit: Payer: Self-pay

## 2014-01-13 MED ORDER — LISINOPRIL 10 MG PO TABS: 10.0000 mg | ORAL_TABLET | Freq: Every day | ORAL | Status: DC

## 2014-01-31 ENCOUNTER — Encounter: Payer: Self-pay | Admitting: Internal Medicine

## 2014-01-31 ENCOUNTER — Ambulatory Visit (INDEPENDENT_AMBULATORY_CARE_PROVIDER_SITE_OTHER): Payer: Medicare Other | Admitting: Internal Medicine

## 2014-01-31 VITALS — BP 130/72 | HR 55 | Ht 60.0 in | Wt 196.0 lb

## 2014-01-31 DIAGNOSIS — R1310 Dysphagia, unspecified: Secondary | ICD-10-CM

## 2014-01-31 DIAGNOSIS — R1319 Other dysphagia: Secondary | ICD-10-CM

## 2014-01-31 DIAGNOSIS — Z86010 Personal history of colonic polyps: Secondary | ICD-10-CM

## 2014-01-31 DIAGNOSIS — R1314 Dysphagia, pharyngoesophageal phase: Secondary | ICD-10-CM

## 2014-01-31 DIAGNOSIS — K219 Gastro-esophageal reflux disease without esophagitis: Secondary | ICD-10-CM

## 2014-01-31 NOTE — Patient Instructions (Signed)
Follow up with Dr Carlean Purl in a year or sooner if needed.   We will refill your Nexium in January 2016 to your mail order pharmacy per your request.    I appreciate the opportunity to care for you.

## 2014-01-31 NOTE — Progress Notes (Signed)
Subjective:    Patient ID: [name], female    DOB: 26-May-1940, 73 y.o.   MRN: [id number]  HPI Elderly woman with a hx of GERD here to see me for first time after retirement of Dr. Sharlett Iles. Has rare solid dysphagia but " I am not ready to have a dilation:. Las esophageal dilation 2009. No heartburn on Nexium 40 mg bid. Would like a refill in Jan 2016 after out of doughnut hole. Due for colonoscopy 2017. Elderly sister had 1, dx 43's and died 56. Patient had adenoma removed 2009. Patient is s/p segmental colectomy for diverticulitis.  No Known Allergies Outpatient Prescriptions Prior to Visit  Medication Sig Dispense Refill  . aspirin EC 81 MG tablet Take 81 mg by mouth daily.    [name] atorvastatin (LIPITOR) 20 MG tablet Take 1 tablet (20 mg total) by mouth at bedtime. 90 tablet 3  . Cyanocobalamin (VITAMIN B-12 IJ) Inject as directed every 30 (thirty) days.    [name] esomeprazole (NEXIUM) 40 MG capsule Take 1 capsule (40 mg total) by mouth 2 (two) times daily before a meal. 180 capsule 4  . fluticasone (FLONASE) 50 MCG/ACT nasal spray Place 1 spray into both nostrils daily.    . hydrochlorothiazide (HYDRODIURIL) 25 MG tablet Take 1 tablet (25 mg total) by mouth daily. 90 tablet 3  . lisinopril (PRINIVIL,ZESTRIL) 10 MG tablet Take 1 tablet (10 mg total) by mouth daily. 90 tablet 0  . metoCLOPramide (REGLAN) 10 MG tablet Take 10 mg by mouth 2 (two) times daily as needed. For migraine headache    . oxyCODONE-acetaminophen (TYLOX) 5-500 MG per capsule Take 1 capsule by mouth every 4 (four) hours as needed. For migraine headaches    . cephALEXin (KEFLEX) 500 MG capsule Take 1 capsule (500 mg total) by mouth 4 (four) times daily. 28 capsule 0  . metoprolol (LOPRESSOR) 50 MG tablet Take 1 tablet (50 mg total) by mouth 2 (two) times daily. 180 tablet 3   No facility-administered medications prior to visit.   Past Medical History  Diagnosis Date  . Obesity, unspecified   . Stricture and  stenosis of esophagus   . Diverticulosis of colon (without mention of hemorrhage)   . Personal history of colonic polyps 05/31/2007    adenomatous polyp  . Esophageal reflux   . Unspecified gastritis and gastroduodenitis without mention of hemorrhage   . Hiatal hernia   . Scoliosis   . Hyperlipidemia   . Hypertension   . Osteoporosis   . Family history of malignant neoplasm of gastrointestinal tract   . Dysrhythmia     notes in epic  . DVT of leg (deep venous thrombosis) 09/2011    LLE  . Pneumonia     "3 times at least" (09/24/11)  . Migraines 09/24/11    "often recently;  more controlled now"  . Rheumatoid arthritis(714.0)   . Arthritis     "got alot; especially in my spine"  . Fibromyalgia     "dx'd w/it when it first came out"  . Chronic back pain     "all of it"  . Palpitations     event monitor 8/13: NSR, Sinus brady, Sinus Tachy, PAC   Past Surgical History  Procedure Laterality Date  . Sigmoid resection / rectopexy  09/1999    Dr Rosana Hoes  . Salivary gland surgery  ~ 1980    1 removed  . Shoulder surgery  2008    right shoulder and collar bone  .  Colonoscopy  2009  . Upper gastrointestinal endoscopy    . Breast surgery      cyst lanced and drainded  . Tumor removal  1960's    left leg  . Lumbar laminectomy/decompression microdiscectomy  09/03/2011    Procedure: LUMBAR LAMINECTOMY/DECOMPRESSION MICRODISCECTOMY 2 LEVELS;  Surgeon: Elaina Hoops, MD;  Location: [facility];  Service: Neurosurgery;  Laterality: Left;  Left Lumbar Three-Four, Lumbar Four-Five Decompressive Laminectomy  . Exploratory laparotomy  04/2000    "for ruptured colon"  . Esophageal dilation      "several times"  . Trigger finger release  2004    left thumb  . Breast cyst incision and drainage  2012    left; "golf-ball sized"  . Back surgery    . Hematoma evacuation  09/2011    LLE  . Colon surgery       Review of Systems As above    Objective:   Physical Exam Obese, NAD    Assessment  & Plan:  Gastroesophageal reflux disease without esophagitis  Esophageal dysphagia  Hx of adenomatous polyp of colon  1. Continue PPI 2. She will call when ready to schedule esophageal dilation - she may want to do at hospital which is ok 3. Colonoscopy 02/2016  PV:[id number], MD

## 2014-02-03 ENCOUNTER — Telehealth: Payer: Self-pay | Admitting: Cardiology
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
## 2014-02-03 NOTE — Telephone Encounter (Signed)
Spoke with pt, she reports the last time she had labs drawn they "blew her vein." She would like to have future labs drawn at [place]. Explained to the patient that going to [facility] will be fine. She will need to make sure she tells the staff she wants to go to [facility] if she needs labs. Patient voiced understanding

## 2014-02-06 ENCOUNTER — Ambulatory Visit (INDEPENDENT_AMBULATORY_CARE_PROVIDER_SITE_OTHER): Payer: Medicare Other | Admitting: Physician Assistant

## 2014-02-06 ENCOUNTER — Encounter: Payer: Self-pay | Admitting: Physician Assistant

## 2014-02-06 VITALS — BP 140/69 | HR 58 | Ht 60.0 in | Wt 197.0 lb

## 2014-02-06 DIAGNOSIS — I10 Essential (primary) hypertension: Secondary | ICD-10-CM

## 2014-02-06 DIAGNOSIS — I951 Orthostatic hypotension: Secondary | ICD-10-CM

## 2014-02-06 DIAGNOSIS — I82409 Acute embolism and thrombosis of unspecified deep veins of unspecified lower extremity: Secondary | ICD-10-CM

## 2014-02-06 DIAGNOSIS — R0600 Dyspnea, unspecified: Secondary | ICD-10-CM

## 2014-02-06 DIAGNOSIS — R0609 Other forms of dyspnea: Secondary | ICD-10-CM

## 2014-02-06 NOTE — Patient Instructions (Signed)
Your physician wants you to follow-up in: [name]. You will receive a reminder letter in the mail two months in advance. If you don't receive a letter, please call our office to schedule the follow-up appointment.   Your physician recommends that you continue on your current medications as directed. Please refer to the Current Medication list given to you today.[name]

## 2014-02-06 NOTE — Progress Notes (Signed)
Cardiology Office Note   Date:  02/06/2014   ID:  [name], DOB 1940/12/09, MRN [id number]  PCP:  Milagros Evener, MD  Cardiologist:  Dr. Loralie Champagne     History of Present Illness: [name] is a 73 y.o. female with a hx of HTN, GERD, autonomic insufficiency, and migraines.  She had a lumbar laminectomy in 6/13. After this, she had a left lower leg DVT. Also after the operation, she developed orthostatic symptoms manifested predominantly as postural tachycardia. HCTZ and diltiazem were stopped and she was left on metoprolol. She later tolerated the addition of lisinopril for HTN.  Last seen by Dr. Loralie Champagne 01/2013.  Autonomic insufficiency was felt to possibly be related to lumbar laminectomy.    She returns for follow-up. Since last seen, she has done very well. She uses caution with going from lying/sitting to standing. She denies significant dizziness. She denies significant dyspnea. She denies chest pain. She denies syncope. She denies orthopnea, PND or significant pedal edema.   Studies:   - Echo (7/13):  Mild LVH, EF 65-70%  - Nuclear (2/13):  Normal stress nuclear study. Mild anteroapical attenuation from breast shadow.  EF 80%   Recent Labs: 11/13/2013: ALT 13; BUN 14; Creatinine 0.92; Hemoglobin 13.8; Potassium 4.0; Sodium 140    Recent Radiology: No results found.    Wt Readings from Last 3 Encounters:  01/31/14 196 lb (88.905 kg)  03/31/13 208 lb (94.348 kg)  01/27/13 211 lb (95.709 kg)     Past Medical History: 1. GERD with esophageal stricture and hiatal hernia.  2. Low back pain: s/p lumbar laminectomy in 6/13.  3. Hyperlipidemia 4. HTN 5. Migraines.  6. Echo (7/13): EF 65-70%, mild LVH  7. Scoliosis 8. B12 deficiency 9. Osteoarthritis 10. Diverticulitis with surgery in 2001 11. Atypical chest pain: Lexiscan myoview (2/13) with EF 80%, breast attenuation, no evidence for ischemia or infarction.  12. Palpitations: Holter  (2/13) with occasional PACs.  13. Carotid dopplers (7/13) with minimal disease.  14. Autonomic insufficiency: post-lumbar laminectomy in 6/13. Predominantly manifested as postural tachycardia.  15. DVT 6/13 post-surgery.  Past Medical History  Diagnosis Date  . Obesity, unspecified   . Stricture and stenosis of esophagus   . Diverticulosis of colon (without mention of hemorrhage)   . Personal history of colonic polyps 05/31/2007    adenomatous polyp  . Esophageal reflux   . Unspecified gastritis and gastroduodenitis without mention of hemorrhage   . Hiatal hernia   . Scoliosis   . Hyperlipidemia   . Hypertension   . Osteoporosis   . Family history of malignant neoplasm of gastrointestinal tract   . Dysrhythmia     notes in epic  . DVT of leg (deep venous thrombosis) 09/2011    LLE  . Pneumonia     "3 times at least" (09/24/11)  . Migraines 09/24/11    "often recently;  more controlled now"  . Rheumatoid arthritis(714.0)   . Arthritis     "got alot; especially in my spine"  . Fibromyalgia     "dx'd w/it when it first came out"  . Chronic back pain     "all of it"  . Palpitations     event monitor 8/13: NSR, Sinus brady, Sinus Tachy, PAC    Current Outpatient Prescriptions  Medication Sig Dispense Refill  . aspirin EC 81 MG tablet Take 81 mg by mouth daily.    [name] atorvastatin (LIPITOR) 20 MG tablet Take 1 tablet (20  mg total) by mouth at bedtime. 90 tablet 3  . Cyanocobalamin (VITAMIN B-12 IJ) Inject as directed every 30 (thirty) days.    [name] esomeprazole (NEXIUM) 40 MG capsule Take 1 capsule (40 mg total) by mouth 2 (two) times daily before a meal. 180 capsule 4  . fluticasone (FLONASE) 50 MCG/ACT nasal spray Place 1 spray into both nostrils daily.    . hydrochlorothiazide (HYDRODIURIL) 25 MG tablet Take 1 tablet (25 mg total) by mouth daily. 90 tablet 3  . lisinopril (PRINIVIL,ZESTRIL) 10 MG tablet Take 1 tablet (10 mg total) by mouth daily. 90 tablet 0  . metoCLOPramide  (REGLAN) 10 MG tablet Take 10 mg by mouth 2 (two) times daily as needed. For migraine headache    . metoprolol (LOPRESSOR) 50 MG tablet Take 1 tablet (50 mg total) by mouth 2 (two) times daily. 180 tablet 3  . oxyCODONE-acetaminophen (TYLOX) 5-500 MG per capsule Take 1 capsule by mouth every 4 (four) hours as needed. For migraine headaches     No current facility-administered medications for this visit.     Allergies:   Review of patient's allergies indicates no known allergies.   Social History:  The patient  reports that she has never smoked. She has never used smokeless tobacco. She reports that she does not drink alcohol or use illicit drugs.   Family History:  The patient's family history includes Colon cancer (age of onset: 37) in her sister; Diabetes in her father and sister; Hypertension in her father and mother; Pancreatic cancer in her sister; Stroke in her father and mother.    ROS:  Please see the history of present illness.   She has a lot of bilateral foot pain.   All other systems reviewed and negative.    PHYSICAL EXAM: VS:  BP 140/69 mmHg  Pulse 58  Ht 5' (1.524 m)  Wt 197 lb (89.359 kg)  BMI 38.47 kg/m2 Well nourished, well developed, in no acute distress HEENT: normal Neck: no JVD  at 90 Cardiac:  normal S1, S2;  RRR; no murmur   Lungs:   clear to auscultation bilaterally, no wheezing, rhonchi or rales Abd: soft, nontender, no hepatomegaly Ext: no edema Skin: warm and dry Neuro:  CNs 2-12 intact, no focal abnormalities noted  EKG:  Sinus bradycardia, HR 58, normal axis, nonspecific ST-T wave changes, no change from prior tracing      ASSESSMENT AND PLAN:  1. Autonomic insufficiency:  Overall controlled. Continue current therapy.  2. Exertional dyspnea:   This is chronic and mild and likely multifactorial related to deconditioning, obesity, orthopedic issues. 3. Hypertension:  Controlled. 4. DVT:  Post-surgical. She completed warfarin and is now on ASA  only.    Disposition:   FU with Dr. Aundra Dubin one year.   Signed, Versie Starks, MHS 02/06/2014 3:39 PM    [facility] [place], [place], [place]  [address] Phone: [phone]; Fax: [phone]

## 2014-03-04 ENCOUNTER — Other Ambulatory Visit: Payer: Self-pay | Admitting: Cardiology

## 2014-03-07 ENCOUNTER — Other Ambulatory Visit: Payer: Self-pay

## 2014-03-07 MED ORDER — LISINOPRIL 10 MG PO TABS: 10.0000 mg | ORAL_TABLET | Freq: Every day | ORAL | Status: DC

## 2014-03-11 ENCOUNTER — Other Ambulatory Visit: Payer: Self-pay | Admitting: Cardiology

## 2014-03-13 ENCOUNTER — Telehealth: Payer: Self-pay

## 2014-03-13 MED ORDER — ESOMEPRAZOLE MAGNESIUM 40 MG PO CPDR: DELAYED_RELEASE_CAPSULE | ORAL | Status: DC

## 2014-03-13 NOTE — Telephone Encounter (Signed)
-----   Message from Reya Aurich E [place], [place] sent at 01/31/2014 12:34 PM EST ----- Send her Nexium rx Jan 1st or 4th to her mail order [facility] for her , ok per Dr CG, 3 mos. At a time for 1 year refills.

## 2014-03-13 NOTE — Telephone Encounter (Signed)
Patient informed that her Nexium sent in as requested.  She was appreciative.

## 2014-06-02 ENCOUNTER — Telehealth: Payer: Self-pay | Admitting: Cardiology

## 2014-06-02 MED ORDER — METOPROLOL TARTRATE 50 MG PO TABS: ORAL_TABLET | ORAL | Status: DC

## 2014-06-02 NOTE — Telephone Encounter (Signed)
Patient requesting refill of Metoprolol 50 mg for 90 days sent to [facility]. Completed reorder.

## 2014-06-02 NOTE — Telephone Encounter (Signed)
New problem   Pt need new prescription for Metoprolol 50mg  for 90 days sent to [facility]. Please advise pt.

## 2014-07-07 ENCOUNTER — Telehealth: Payer: Self-pay | Admitting: Internal Medicine

## 2014-07-07 NOTE — Telephone Encounter (Signed)
Attempted to call patient and the phone just rang and rang.  Will try to reach her again at a later time.

## 2014-07-10 NOTE — Telephone Encounter (Signed)
Left message on patient's voice mail to call me back.

## 2014-07-12 NOTE — Telephone Encounter (Signed)
Spoke with patient, she thinks her Nexium needs a prior authorization.  I told her I haven't seen anything come across.  I will investigate it after clinic today and be back in touch with her.

## 2014-07-13 NOTE — Telephone Encounter (Signed)
Have done CoverMyMeds and will await the outcome for her Nexium rx.

## 2014-07-14 NOTE — Telephone Encounter (Signed)
Patient's insurance company called to let me know that Nexium has been denied.  She is faxing over the information to do an appeal letter.

## 2014-07-14 NOTE — Telephone Encounter (Signed)
Left detailed message for patient that we are still working on getting her Nexium approved.

## 2014-07-31 ENCOUNTER — Encounter: Payer: Self-pay | Admitting: Internal Medicine

## 2014-07-31 NOTE — Telephone Encounter (Signed)
Appeal letter faxed to [employer] today at # [phone].

## 2014-08-02 NOTE — Telephone Encounter (Signed)
Received call from [employer] informing us of an approval of the recent appeal we sent in.  They will notify patient as well.

## 2014-11-08 ENCOUNTER — Encounter: Payer: Self-pay | Admitting: Gastroenterology

## 2014-11-28 ENCOUNTER — Ambulatory Visit: Payer: Medicare Other | Admitting: Internal Medicine

## 2014-12-05 ENCOUNTER — Other Ambulatory Visit: Payer: Self-pay | Admitting: Neurosurgery

## 2014-12-05 DIAGNOSIS — M5137 Other intervertebral disc degeneration, lumbosacral region: Secondary | ICD-10-CM

## 2014-12-18 ENCOUNTER — Other Ambulatory Visit: Payer: Self-pay | Admitting: Cardiology

## 2014-12-27 ENCOUNTER — Ambulatory Visit
Admission: RE | Admit: 2014-12-27 | Discharge: 2014-12-27 | Disposition: A | Discharge status: Home / Self Care | Payer: Medicare Other | Source: Ambulatory Visit | Attending: Neurosurgery | Admitting: Neurosurgery

## 2014-12-27 DIAGNOSIS — M5137 Other intervertebral disc degeneration, lumbosacral region: Secondary | ICD-10-CM

## 2014-12-27 MED ORDER — GADOBENATE DIMEGLUMINE 529 MG/ML IV SOLN: 19.0000 mL | Freq: Once | INTRAVENOUS | Status: DC | PRN

## 2015-02-07 ENCOUNTER — Other Ambulatory Visit: Payer: Self-pay | Admitting: Cardiology

## 2015-03-15 ENCOUNTER — Other Ambulatory Visit: Payer: Self-pay | Admitting: Cardiology

## 2015-03-16 ENCOUNTER — Other Ambulatory Visit: Payer: Self-pay | Admitting: Cardiology

## 2015-03-22 NOTE — Progress Notes (Signed)
Cardiology Office Note    Date:  03/23/2015   ID:  [name], DOB Oct 23, 1940, MRN [id number]  PCP:  [name], MD  Cardiologist:  Dr. Loralie Champagne   Electrophysiologist:  n/a   Chief Complaint  Patient presents with  . Follow-up    HTN    History of Present Illness:  [name] is a 75 y.o. female with a hx of HTN, GERD, autonomic insufficiency, and migraines. She had a lumbar laminectomy in 6/13. After this, she had a left lower leg DVT. Also after the operation, she developed orthostatic symptoms manifested predominantly as postural tachycardia. HCTZ and diltiazem were stopped and she was left on metoprolol. She later tolerated the addition of lisinopril for HTN. Autonomic insufficiency has been felt to possibly be related to lumbar laminectomy.   I saw her last in 11/15.  She returns for FU. Overall, she is doing well. She does note increasing chest symptoms that she relates to indigestion. She has not been taking her Nexium on a regular basis. She has chronic dyspnea and exertion. She is significantly limited by back pain. Her dyspnea seems to be related to deconditioning from this. She denies any significant change. She denies orthopnea, PND or edema. She denies syncope. Blood pressures at home typically range 150s-160s over 90s.   Past Medical History  Diagnosis Date  . Obesity, unspecified   . Stricture and stenosis of esophagus   . Diverticulosis of colon (without mention of hemorrhage)   . Personal history of colonic polyps 05/31/2007    adenomatous polyp  . Esophageal reflux   . Unspecified gastritis and gastroduodenitis without mention of hemorrhage   . Hiatal hernia   . Scoliosis   . Hyperlipidemia   . Hypertension   . Osteoporosis   . Family history of malignant neoplasm of gastrointestinal tract   . Dysrhythmia     notes in epic  . DVT of leg (deep venous thrombosis) ([place]) 09/2011    LLE  . Pneumonia     "3 times at least" (09/24/11)   . Migraines 09/24/11    "often recently;  more controlled now"  . Rheumatoid arthritis(714.0)   . Arthritis     "got alot; especially in my spine"  . Fibromyalgia     "dx'd w/it when it first came out"  . Chronic back pain     "all of it"  . Palpitations     event monitor 8/13: NSR, Sinus brady, Sinus Tachy, PAC  1. GERD with esophageal stricture and hiatal hernia.  2. Low back pain: s/p lumbar laminectomy in 6/13.  3. Hyperlipidemia 4. HTN 5. Migraines.  6. Echo (7/13): EF 65-70%, mild LVH  7. Scoliosis 8. B12 deficiency 9. Osteoarthritis 10. Diverticulitis with surgery in 2001 11. Atypical chest pain: Lexiscan myoview (2/13) with EF 80%, breast attenuation, no evidence for ischemia or infarction.  12. Palpitations: Holter (2/13) with occasional PACs.  13. Carotid dopplers (7/13) with minimal disease.  14. Autonomic insufficiency: post-lumbar laminectomy in 6/13. Predominantly manifested as postural tachycardia.  15. DVT 6/13 post-surgery.   Past Surgical History  Procedure Laterality Date  . Sigmoid resection / rectopexy  09/1999    Dr Rosana Hoes  . Salivary gland surgery  ~ 1980    1 removed  . Shoulder surgery  2008    right shoulder and collar bone  . Colonoscopy  2009  . Upper gastrointestinal endoscopy    . Breast surgery      cyst  lanced and drainded  . Tumor removal  1960's    left leg  . Lumbar laminectomy/decompression microdiscectomy  09/03/2011    Procedure: LUMBAR LAMINECTOMY/DECOMPRESSION MICRODISCECTOMY 2 LEVELS;  Surgeon: Elaina Hoops, MD;  Location: [facility];  Service: Neurosurgery;  Laterality: Left;  Left Lumbar Three-Four, Lumbar Four-Five Decompressive Laminectomy  . Exploratory laparotomy  04/2000    "for ruptured colon"  . Esophageal dilation      "several times"  . Trigger finger release  2004    left thumb  . Breast cyst incision and drainage  2012    left; "golf-ball sized"  . Back surgery    . Hematoma evacuation  09/2011    LLE    . Colon surgery      Current Medications:   Outpatient Prescriptions Prior to Visit  Medication Sig Dispense Refill  . aspirin EC 81 MG tablet Take 81 mg by mouth daily.    . Cyanocobalamin (VITAMIN B-12 IJ) Inject as directed every 30 (thirty) days.    [name] esomeprazole (NEXIUM) 40 MG capsule Take one capsule 30 minutes before breakfast and supper daily. 180 capsule 3  . fluticasone (FLONASE) 50 MCG/ACT nasal spray Place 1 spray into both nostrils daily as needed for allergies or rhinitis.     [name] metoCLOPramide (REGLAN) 10 MG tablet Take 10 mg by mouth 2 (two) times daily as needed. For migraine headache    . oxyCODONE-acetaminophen (TYLOX) 5-500 MG per capsule Take 1 capsule by mouth every 4 (four) hours as needed. For migraine headaches    . hydrochlorothiazide (HYDRODIURIL) 25 MG tablet Take 1 tablet (25 mg total) by mouth daily. 90 tablet 0  . lisinopril (PRINIVIL,ZESTRIL) 10 MG tablet Take 1 tablet (10 mg total) by mouth daily. 90 tablet 0  . metoprolol (LOPRESSOR) 50 MG tablet Take (1) one tablet by mouth twice daily. 180 tablet 3  . atorvastatin (LIPITOR) 20 MG tablet TAKE 1 BY MOUTH AT BEDTIME (Patient not taking: Reported on 03/23/2015) 90 tablet 0   No facility-administered medications prior to visit.     Allergies:   Review of patient's allergies indicates no known allergies.   Social History   Social History  . Marital Status: Married    Spouse Name: N/A  . Number of Children: 1  . Years of Education: N/A   Occupational History  .     Social History Main Topics  . Smoking status: Never Smoker   . Smokeless tobacco: Never Used  . Alcohol Use: No  . Drug Use: No  . Sexual Activity: Not Currently    Birth Control/ Protection: Post-menopausal   Other Topics Concern  . None   Social History Narrative     Family History:  The patient's family history includes Cancer in her sister; Colon cancer (age of onset: 78) in her sister; Diabetes in her father and sister;  Hypertension in her father and mother; Pancreatic cancer in her sister; Stroke in her father and mother. There is no history of Heart attack.   ROS:   Please see the history of present illness.    ROS All other systems reviewed and are negative.   PHYSICAL EXAM:   VS:  BP 150/78 mmHg  Pulse 63  Ht 5' (1.524 m)  Wt 207 lb (93.895 kg)  BMI 40.43 kg/m2   GEN: Well nourished, well developed, in no acute distress HEENT: normal Neck: no JVD, no masses Cardiac: Normal S1/S2, RRR; no murmurs, rubs, or gallops, trace bilateral ankle  edema;  no carotid bruits,   Respiratory:  clear to auscultation bilaterally; no wheezing, rhonchi or rales GI: soft, nontender, nondistended MS: no deformity or atrophy Skin: warm and dry, no rash Neuro:  no focal deficits  Psych: Alert and oriented x 3, normal affect  Wt Readings from Last 3 Encounters:  03/23/15 207 lb (93.895 kg)  02/06/14 197 lb (89.359 kg)  01/31/14 196 lb (88.905 kg)      Studies/Labs Reviewed:   EKG:  EKG is  ordered today.  The ekg ordered today demonstrates NSR, HR 63, normal axis, no ST changes, QTc 413 ms, no change compared to prior tracings  Recent Labs: No results found for requested labs within last 365 days.   Recent Lipid Panel    Component Value Date/Time   CHOL  06/06/2007 0040    200        ATP III CLASSIFICATION:  <200     mg/dL   Desirable  200-239  mg/dL   Borderline High  >=240    mg/dL   High   TRIG 100 06/06/2007 0040   HDL 44 06/06/2007 0040   CHOLHDL 4.5 06/06/2007 0040   VLDL 20 06/06/2007 0040   LDLCALC * 06/06/2007 0040    136        Total Cholesterol/HDL:CHD Risk Coronary Heart Disease Risk Table                     Men   Women  1/2 Average Risk   3.4   3.3    Additional studies/ records that were reviewed today include:   Echo (7/13):  Mild LVH, EF 65-70%  Nuclear (2/13):  Normal stress nuclear study. Mild anteroapical attenuation from breast shadow. EF 80%    ASSESSMENT:      1. Essential hypertension   2. Other chest pain   3. Autonomic orthostatic hypotension   4. DVT (deep venous thrombosis), unspecified laterality   5. Hyperlipidemia     PLAN:  In order of problems listed above:  1. Hypertension - Borderline control. I have asked her to monitor her blood pressure daily over the next 2 weeks and call us with readings. If her blood pressure remains 150/90 or higher, increase lisinopril to 20 mg daily. Request recent labs from PCP ([facility]).  2. Chest pain - This seems to be related to her GERD.  She has not been c/w taking her PPI.  We discussed proceeding with stress testing (last nuclear scan was 4 years ago). She prefers to hold off for now.    -  Resume Nexium 40 mg QD  -  Call in next 2 weeks if no relief with Nexium >> if no relief, get stress nuclear study.  3. Autonomic insufficiency: Overall controlled. Continue current therapy.  4. Hx of DVT: Post-surgical. She completed warfarin and is now on ASA only.   5. HL - Continue statin. Request recent lipids and LFTs from primary care.    Medication Adjustments/Labs and Tests Ordered: Current medicines are reviewed at length with the patient today.  Concerns regarding medicines are outlined above.  Medication changes, Labs and Tests ordered today are outlined in the Patient Instructions noted below.   Signed, Richardson Dopp, PA-C  03/23/2015 3:01 PM    [facility] [place], [place], [place]  [address] Phone: [phone]; Fax: [phone]    Patient Instructions  Medication Instructions:  1. REFILLS HAVE BEEN SENT TO [facility] FOR  LIPITOR, HCTZ, LISINOPRIL, METOPROLOL  2. RESUME NEXIUM DAILY  Labwork: NONE  Testing/Procedures: NONE  Follow-Up: Your physician wants you to follow-up in: [place] [name]. You will receive a reminder letter in the mail two months in advance. If you don't receive a letter, please call our office to schedule the  follow-up appointment.   Any Other Special Instructions Will Be Listed Below (If Applicable). CHECK BP DAILY FOR 2 WEEKS THEN CALL WITH READINGS AFTER THE 2 WEEKS  IF CHEST PAIN IS NO BETTER WITH THE NEXIUM ON A DAILY BASIS THEN PLEASE CALL THE OFFICE TO SCHEDULE A STRESS TEST PER [name], PAC    If you need a refill on your cardiac medications before your next appointment, please call your pharmacy.

## 2015-03-23 ENCOUNTER — Encounter: Payer: Self-pay | Admitting: Physician Assistant

## 2015-03-23 ENCOUNTER — Ambulatory Visit (INDEPENDENT_AMBULATORY_CARE_PROVIDER_SITE_OTHER): Payer: Medicare Other | Admitting: Physician Assistant

## 2015-03-23 VITALS — BP 150/78 | HR 63 | Ht 60.0 in | Wt 207.0 lb

## 2015-03-23 DIAGNOSIS — I10 Essential (primary) hypertension: Secondary | ICD-10-CM | POA: Diagnosis not present

## 2015-03-23 DIAGNOSIS — R0789 Other chest pain: Secondary | ICD-10-CM

## 2015-03-23 DIAGNOSIS — I951 Orthostatic hypotension: Secondary | ICD-10-CM

## 2015-03-23 DIAGNOSIS — I82409 Acute embolism and thrombosis of unspecified deep veins of unspecified lower extremity: Secondary | ICD-10-CM

## 2015-03-23 DIAGNOSIS — E785 Hyperlipidemia, unspecified: Secondary | ICD-10-CM

## 2015-03-23 MED ORDER — ATORVASTATIN CALCIUM 20 MG PO TABS: 20.0000 mg | ORAL_TABLET | Freq: Every day | ORAL | Status: DC

## 2015-03-23 MED ORDER — LISINOPRIL 10 MG PO TABS: 10.0000 mg | ORAL_TABLET | Freq: Every day | ORAL | Status: DC

## 2015-03-23 MED ORDER — HYDROCHLOROTHIAZIDE 25 MG PO TABS: 25.0000 mg | ORAL_TABLET | Freq: Every day | ORAL | Status: DC

## 2015-03-23 MED ORDER — METOPROLOL TARTRATE 50 MG PO TABS: ORAL_TABLET | ORAL | Status: DC

## 2015-03-23 NOTE — Patient Instructions (Signed)
Medication Instructions:  1. REFILLS HAVE BEEN SENT TO [facility] FOR LIPITOR, HCTZ, LISINOPRIL, METOPROLOL  2. RESUME NEXIUM DAILY  Labwork: NONE  Testing/Procedures: NONE  Follow-Up: Your physician wants you to follow-up in: [place] Aundra Dubin. You will receive a reminder letter in the mail two months in advance. If you don't receive a letter, please call our office to schedule the follow-up appointment.   Any Other Special Instructions Will Be Listed Below (If Applicable). CHECK BP DAILY FOR 2 WEEKS THEN CALL WITH READINGS AFTER THE 2 WEEKS  IF CHEST PAIN IS NO BETTER WITH THE NEXIUM ON A DAILY BASIS THEN PLEASE CALL THE OFFICE TO SCHEDULE A STRESS TEST PER SCOTT WEAVER, PAC    If you need a refill on your cardiac medications before your next appointment, please call your pharmacy.

## 2015-04-16 DIAGNOSIS — E538 Deficiency of other specified B group vitamins: Secondary | ICD-10-CM | POA: Diagnosis not present

## 2015-05-14 DIAGNOSIS — D81819 Biotin-dependent carboxylase deficiency, unspecified: Secondary | ICD-10-CM | POA: Diagnosis not present

## 2015-05-28 DIAGNOSIS — Z0000 Encounter for general adult medical examination without abnormal findings: Secondary | ICD-10-CM | POA: Diagnosis not present

## 2015-05-28 DIAGNOSIS — E7800 Pure hypercholesterolemia, unspecified: Secondary | ICD-10-CM | POA: Diagnosis not present

## 2015-05-28 DIAGNOSIS — E538 Deficiency of other specified B group vitamins: Secondary | ICD-10-CM | POA: Diagnosis not present

## 2015-05-28 DIAGNOSIS — L659 Nonscarring hair loss, unspecified: Secondary | ICD-10-CM | POA: Diagnosis not present

## 2015-05-28 DIAGNOSIS — I10 Essential (primary) hypertension: Secondary | ICD-10-CM | POA: Diagnosis not present

## 2015-06-04 DIAGNOSIS — E538 Deficiency of other specified B group vitamins: Secondary | ICD-10-CM | POA: Diagnosis not present

## 2015-06-04 DIAGNOSIS — E7800 Pure hypercholesterolemia, unspecified: Secondary | ICD-10-CM | POA: Diagnosis not present

## 2015-06-18 DIAGNOSIS — E538 Deficiency of other specified B group vitamins: Secondary | ICD-10-CM | POA: Diagnosis not present

## 2015-07-09 DIAGNOSIS — R21 Rash and other nonspecific skin eruption: Secondary | ICD-10-CM | POA: Diagnosis not present

## 2015-07-09 DIAGNOSIS — L739 Follicular disorder, unspecified: Secondary | ICD-10-CM | POA: Diagnosis not present

## 2015-07-20 DIAGNOSIS — E538 Deficiency of other specified B group vitamins: Secondary | ICD-10-CM | POA: Diagnosis not present

## 2015-08-09 DIAGNOSIS — M5137 Other intervertebral disc degeneration, lumbosacral region: Secondary | ICD-10-CM | POA: Diagnosis not present

## 2015-08-09 DIAGNOSIS — M4120 Other idiopathic scoliosis, site unspecified: Secondary | ICD-10-CM | POA: Diagnosis not present

## 2015-08-10 DIAGNOSIS — M170 Bilateral primary osteoarthritis of knee: Secondary | ICD-10-CM | POA: Diagnosis not present

## 2015-08-10 DIAGNOSIS — M1711 Unilateral primary osteoarthritis, right knee: Secondary | ICD-10-CM | POA: Diagnosis not present

## 2015-08-10 DIAGNOSIS — M1712 Unilateral primary osteoarthritis, left knee: Secondary | ICD-10-CM | POA: Diagnosis not present

## 2015-08-21 DIAGNOSIS — E538 Deficiency of other specified B group vitamins: Secondary | ICD-10-CM | POA: Diagnosis not present

## 2015-09-10 DIAGNOSIS — M4120 Other idiopathic scoliosis, site unspecified: Secondary | ICD-10-CM | POA: Diagnosis not present

## 2015-09-10 DIAGNOSIS — M47817 Spondylosis without myelopathy or radiculopathy, lumbosacral region: Secondary | ICD-10-CM | POA: Diagnosis not present

## 2015-09-20 DIAGNOSIS — R252 Cramp and spasm: Secondary | ICD-10-CM | POA: Diagnosis not present

## 2015-09-20 DIAGNOSIS — R7301 Impaired fasting glucose: Secondary | ICD-10-CM | POA: Diagnosis not present

## 2015-09-20 DIAGNOSIS — E538 Deficiency of other specified B group vitamins: Secondary | ICD-10-CM | POA: Diagnosis not present

## 2015-09-20 DIAGNOSIS — I10 Essential (primary) hypertension: Secondary | ICD-10-CM | POA: Diagnosis not present

## 2015-10-09 DIAGNOSIS — I10 Essential (primary) hypertension: Secondary | ICD-10-CM | POA: Diagnosis not present

## 2015-10-09 DIAGNOSIS — Z6839 Body mass index (BMI) 39.0-39.9, adult: Secondary | ICD-10-CM | POA: Diagnosis not present

## 2015-10-09 DIAGNOSIS — M542 Cervicalgia: Secondary | ICD-10-CM | POA: Diagnosis not present

## 2015-10-23 DIAGNOSIS — E538 Deficiency of other specified B group vitamins: Secondary | ICD-10-CM | POA: Diagnosis not present

## 2015-11-20 DIAGNOSIS — Z6839 Body mass index (BMI) 39.0-39.9, adult: Secondary | ICD-10-CM | POA: Diagnosis not present

## 2015-11-20 DIAGNOSIS — M542 Cervicalgia: Secondary | ICD-10-CM | POA: Diagnosis not present

## 2015-11-20 DIAGNOSIS — I10 Essential (primary) hypertension: Secondary | ICD-10-CM | POA: Diagnosis not present

## 2015-11-28 DIAGNOSIS — E538 Deficiency of other specified B group vitamins: Secondary | ICD-10-CM | POA: Diagnosis not present

## 2015-12-05 DIAGNOSIS — Z23 Encounter for immunization: Secondary | ICD-10-CM | POA: Diagnosis not present

## 2015-12-27 DIAGNOSIS — E538 Deficiency of other specified B group vitamins: Secondary | ICD-10-CM | POA: Diagnosis not present

## 2016-01-09 ENCOUNTER — Emergency Department (HOSPITAL_COMMUNITY): Payer: Medicare Other

## 2016-01-09 ENCOUNTER — Encounter (HOSPITAL_COMMUNITY): Payer: Self-pay | Admitting: Emergency Medicine

## 2016-01-09 ENCOUNTER — Emergency Department (HOSPITAL_COMMUNITY)
Admission: EM | Admit: 2016-01-09 | Discharge: 2016-01-09 | Disposition: A | Discharge status: Home / Self Care | Payer: Medicare Other | Attending: Emergency Medicine | Admitting: Emergency Medicine

## 2016-01-09 DIAGNOSIS — G43909 Migraine, unspecified, not intractable, without status migrainosus: Secondary | ICD-10-CM | POA: Insufficient documentation

## 2016-01-09 DIAGNOSIS — I10 Essential (primary) hypertension: Secondary | ICD-10-CM | POA: Insufficient documentation

## 2016-01-09 DIAGNOSIS — Z7982 Long term (current) use of aspirin: Secondary | ICD-10-CM | POA: Insufficient documentation

## 2016-01-09 DIAGNOSIS — G43009 Migraine without aura, not intractable, without status migrainosus: Secondary | ICD-10-CM

## 2016-01-09 DIAGNOSIS — R51 Headache: Secondary | ICD-10-CM | POA: Diagnosis not present

## 2016-01-09 LAB — URINALYSIS, ROUTINE W REFLEX MICROSCOPIC
Bilirubin Urine: NEGATIVE
Glucose, UA: NEGATIVE mg/dL
Hgb urine dipstick: NEGATIVE
Ketones, ur: 15 mg/dL — AB
LEUKOCYTES UA: NEGATIVE
Nitrite: NEGATIVE
PROTEIN: NEGATIVE mg/dL
Specific Gravity, Urine: 1.013 (ref 1.005–1.030)
pH: 7.5 (ref 5.0–8.0)

## 2016-01-09 LAB — COMPREHENSIVE METABOLIC PANEL
ALK PHOS: 54 U/L (ref 38–126)
ALT: 16 U/L (ref 14–54)
AST: 25 U/L (ref 15–41)
Albumin: 3.9 g/dL (ref 3.5–5.0)
Anion gap: 12 (ref 5–15)
BUN: 12 mg/dL (ref 6–20)
CALCIUM: 9.2 mg/dL (ref 8.9–10.3)
CHLORIDE: 103 mmol/L (ref 101–111)
CO2: 22 mmol/L (ref 22–32)
CREATININE: 0.83 mg/dL (ref 0.44–1.00)
Glucose, Bld: 163 mg/dL — ABNORMAL HIGH (ref 65–99)
Potassium: 3.5 mmol/L (ref 3.5–5.1)
Sodium: 137 mmol/L (ref 135–145)
Total Bilirubin: 1.6 mg/dL — ABNORMAL HIGH (ref 0.3–1.2)
Total Protein: 6.7 g/dL (ref 6.5–8.1)

## 2016-01-09 LAB — CBC
HCT: 43.4 % (ref 36.0–46.0)
Hemoglobin: 14.8 g/dL (ref 12.0–15.0)
MCH: 28.7 pg (ref 26.0–34.0)
MCHC: 34.1 g/dL (ref 30.0–36.0)
MCV: 84.3 fL (ref 78.0–100.0)
PLATELETS: 249 10*3/uL (ref 150–400)
RBC: 5.15 MIL/uL — AB (ref 3.87–5.11)
RDW: 13.2 % (ref 11.5–15.5)
WBC: 8.8 10*3/uL (ref 4.0–10.5)

## 2016-01-09 LAB — LIPASE, BLOOD: LIPASE: 32 U/L (ref 11–51)

## 2016-01-09 MED ORDER — DIPHENHYDRAMINE HCL 50 MG/ML IJ SOLN
12.5000 mg | Freq: Once | INTRAMUSCULAR | Status: AC
  Administered 2016-01-09: 12.5 mg via INTRAVENOUS
  Filled 2016-01-09: qty 1

## 2016-01-09 MED ORDER — ONDANSETRON 4 MG PO TBDP
8.0000 mg | ORAL_TABLET | Freq: Once | ORAL | Status: AC
  Administered 2016-01-09: 8 mg via ORAL

## 2016-01-09 MED ORDER — ONDANSETRON 4 MG PO TBDP
ORAL_TABLET | ORAL | Status: AC
  Filled 2016-01-09: qty 2

## 2016-01-09 MED ORDER — SODIUM CHLORIDE 0.9 % IV BOLUS (SEPSIS)
1000.0000 mL | Freq: Once | INTRAVENOUS | Status: AC
  Administered 2016-01-09: 1000 mL via INTRAVENOUS

## 2016-01-09 MED ORDER — PROCHLORPERAZINE EDISYLATE 5 MG/ML IJ SOLN
5.0000 mg | Freq: Once | INTRAMUSCULAR | Status: AC
  Administered 2016-01-09: 5 mg via INTRAVENOUS
  Filled 2016-01-09: qty 2

## 2016-01-09 NOTE — ED Triage Notes (Signed)
Patient with nausea that started yesterday, increased during the day and evening, now with dry heaving in triage.  Patient denies any abdominal pain, but she states she has had a headache for the last few days and has some photophobia.  She has equal hand grips, no slurred speech, no facial droop.

## 2016-01-09 NOTE — ED Notes (Signed)
Pt returned to room from MRI.

## 2016-01-09 NOTE — ED Notes (Signed)
Patient has returned from being out of the department

## 2016-01-09 NOTE — ED Provider Notes (Signed)
Patient care is assumed from Dr. Dina Rich for follow-up on MRI results and final disposition. MRI does not identify any acute CVA. Patient does have strong history of migraines but with this headache experienced word finding difficulty. No focal neurologic deficits were identified.  I have rechecked the patient, she is alert and nontoxic. Movements are coordinated purposeful and symmetric. Patient feels significantly improved.  At this time, plan will be to follow up with PCP and the patient's headache specialist. Family and patient are counseled on signs and symptoms for which to return.   Charlesetta Shanks, MD 01/09/16 [phone]

## 2016-01-09 NOTE — ED Notes (Signed)
Patient transported to MRI 

## 2016-01-09 NOTE — ED Provider Notes (Signed)
[facility] DEPT Provider Note   CSN: [id number] Arrival date & time: 01/09/16  [id number]     History   Chief Complaint Chief Complaint  Patient presents with  . Nausea  . Emesis    HPI [name] is a 75 y.o. female.  HPI  This is a 75 year old female with a history of migraines who presents with headache, nausea, and speech difficulty. She has a history of classic migraines. Has not had a migraine in over 2 years. Has had a 2 to three-day history of worsening left-sided headache. She states that it was 10 out of 10 at home but has improved somewhat. Has not taken anything for her pain. Her husband got concerned when he woke up at 1 AM and she was having word finding difficulty and saying things that didn't make sense. He last saw her normal at 9 PM. Patient reports photosensitivity and flashing lights. Denies weakness, numbness, tingling.  Past Medical History:  Diagnosis Date  . Arthritis    "got alot; especially in my spine"  . Chronic back pain    "all of it"  . Diverticulosis of colon (without mention of hemorrhage)   . DVT of leg (deep venous thrombosis) ([place]) 09/2011   LLE  . Dysrhythmia    notes in epic  . Esophageal reflux   . Family history of malignant neoplasm of gastrointestinal tract   . Fibromyalgia    "dx'd w/it when it first came out"  . Hiatal hernia   . Hyperlipidemia   . Hypertension   . Migraines 09/24/11   "often recently;  more controlled now"  . Obesity, unspecified   . Osteoporosis   . Palpitations    event monitor 8/13: NSR, Sinus brady, Sinus Tachy, PAC  . Personal history of colonic polyps 05/31/2007   adenomatous polyp  . Pneumonia    "3 times at least" (09/24/11)  . Rheumatoid arthritis(714.0)   . Scoliosis   . Stricture and stenosis of esophagus   . Unspecified gastritis and gastroduodenitis without mention of hemorrhage     Patient Active Problem List   Diagnosis Date Noted  . Leg pain 01/28/2013  . Autonomic orthostatic  hypotension 11/25/2011  . Nausea 09/25/2011  . Tachycardia, paroxysmal ([place]) 09/24/2011  . DVT (deep venous thrombosis) ([place]) 09/24/2011  . Hyponatremia 09/24/2011  . Hypertension 05/13/2011  . Exertional dyspnea 04/23/2011  . Palpitations 04/23/2011  . Hyperlipidemia 04/23/2011  . Personal history of colonic polyps 02/06/2011  . Family history of malignant neoplasm of gastrointestinal tract 02/06/2011  . Status post partial resection of colon 02/06/2011  . Obesity 02/06/2011  . EXOGENOUS OBESITY 07/01/2007  . GERD 07/01/2007  . HEADACHE, CHRONIC 07/01/2007  . Hx of adenomatous polyp of colon 06/02/2007  . GASTRITIS 05/31/2007  . HIATAL HERNIA 05/31/2007  . DIVERTICULOSIS, COLON 05/31/2007  . ESOPHAGEAL STRICTURE 02/18/2002    Past Surgical History:  Procedure Laterality Date  . BACK SURGERY    . BREAST CYST INCISION AND DRAINAGE  2012   left; "golf-ball sized"  . BREAST SURGERY     cyst lanced and drainded  . COLON SURGERY    . COLONOSCOPY  2009  . ESOPHAGEAL DILATION     "several times"  . EXPLORATORY LAPAROTOMY  04/2000   "for ruptured colon"  . HEMATOMA EVACUATION  09/2011   LLE  . LUMBAR LAMINECTOMY/DECOMPRESSION MICRODISCECTOMY  09/03/2011   Procedure: LUMBAR LAMINECTOMY/DECOMPRESSION MICRODISCECTOMY 2 LEVELS;  Surgeon: Elaina Hoops, MD;  Location: [facility];  Service:  Neurosurgery;  Laterality: Left;  Left Lumbar Three-Four, Lumbar Four-Five Decompressive Laminectomy  . SALIVARY GLAND SURGERY  ~ 1980   1 removed  . SHOULDER SURGERY  2008   right shoulder and collar bone  . SIGMOID RESECTION / RECTOPEXY  09/1999   Dr Rosana Hoes  . TRIGGER FINGER RELEASE  2004   left thumb  . TUMOR REMOVAL  1960's   left leg  . UPPER GASTROINTESTINAL ENDOSCOPY      OB History    No data available       Home Medications    Prior to Admission medications   Medication Sig Start Date End Date Taking? Authorizing Provider  albuterol (PROVENTIL HFA;VENTOLIN HFA) 108 (90 Base)  MCG/ACT inhaler Inhale 1-2 puffs into the lungs every 6 (six) hours as needed for wheezing or shortness of breath.   Yes Historical Provider, MD  aspirin EC 81 MG tablet Take 81 mg by mouth daily.   Yes Historical Provider, MD  atorvastatin (LIPITOR) 20 MG tablet Take 1 tablet (20 mg total) by mouth daily. 03/23/15  Yes Scott T Kathlen Mody, PA-C  Cyanocobalamin (VITAMIN B-12 IJ) Inject as directed every 30 (thirty) days.   Yes Historical Provider, MD  esomeprazole (NEXIUM) 40 MG capsule Take one capsule 30 minutes before breakfast and supper daily. 03/13/14  Yes Gatha Mayer, MD  fluticasone (FLONASE) 50 MCG/ACT nasal spray Place 1 spray into both nostrils daily as needed for allergies or rhinitis.    Yes Historical Provider, MD  hydrochlorothiazide (HYDRODIURIL) 25 MG tablet Take 1 tablet (25 mg total) by mouth daily. 03/23/15  Yes Scott Joylene Draft, PA-C  lisinopril (PRINIVIL,ZESTRIL) 10 MG tablet Take 1 tablet (10 mg total) by mouth daily. 03/23/15  Yes Scott Joylene Draft, PA-C  metoCLOPramide (REGLAN) 10 MG tablet Take 10 mg by mouth 2 (two) times daily as needed. For migraine headache   Yes Historical Provider, MD  metoprolol (LOPRESSOR) 50 MG tablet Take (1) one tablet by mouth twice daily. 03/23/15  Yes Scott Joylene Draft, PA-C  oxyCODONE-acetaminophen (TYLOX) 5-500 MG per capsule Take 1 capsule by mouth every 4 (four) hours as needed. For migraine headaches   Yes Historical Provider, MD    Family History Family History  Problem Relation Age of Onset  . Stroke Mother   . Hypertension Mother   . Stroke Father   . Hypertension Father   . Diabetes Father   . Pancreatic cancer Sister     55 at death  . Diabetes Sister     x 2  . Colon cancer Sister 70  . Cancer Sister   . Heart attack Neg Hx     Social History Social History  Substance Use Topics  . Smoking status: Never Smoker  . Smokeless tobacco: Never Used  . Alcohol use No     Allergies   Review of patient's allergies indicates no known  allergies.   Review of Systems Review of Systems  Constitutional: Negative for fever.  Respiratory: Negative for shortness of breath.   Cardiovascular: Negative for chest pain.  Gastrointestinal: Positive for nausea. Negative for abdominal pain and vomiting.  Neurological: Positive for speech difficulty and headaches. Negative for weakness and numbness.  All other systems reviewed and are negative.    Physical Exam Updated Vital Signs BP 179/69   Pulse 79   Temp 98 F (36.7 C) (Oral)   Resp 19   SpO2 100%   Physical Exam  Constitutional: She is oriented to person, place, and time.  No distress.  Elderly, no acute distress  HENT:  Head: Normocephalic and atraumatic.  Eyes: Pupils are equal, round, and reactive to light.  Neck: Normal range of motion. Neck supple.  Cardiovascular: Normal rate, regular rhythm and normal heart sounds.   No murmur heard. Pulmonary/Chest: Effort normal and breath sounds normal. No respiratory distress. She has no wheezes.  Abdominal: Soft. Bowel sounds are normal. She exhibits no distension.  Neurological: She is alert and oriented to person, place, and time.  Cranial nerves II through XII intact, speech garbled at times and patient says things that does not make sense, she is able to name, difficulty repeating, 5 out of 5 strength in all 4 extremities, no dysmetria to finger-nose-finger  Skin: Skin is warm and dry.  Psychiatric: She has a normal mood and affect.  Nursing note and vitals reviewed.    ED Treatments / Results  Labs (all labs ordered are listed, but only abnormal results are displayed) Labs Reviewed  COMPREHENSIVE METABOLIC PANEL - Abnormal; Notable for the following:       Result Value   Glucose, Bld 163 (*)    Total Bilirubin 1.6 (*)    All other components within normal limits  CBC - Abnormal; Notable for the following:    RBC 5.15 (*)    All other components within normal limits  LIPASE, BLOOD  URINALYSIS, ROUTINE W  REFLEX MICROSCOPIC (NOT AT [facility])    EKG  EKG Interpretation None       Radiology Ct Head Wo Contrast  Result Date: 01/09/2016 CLINICAL DATA:  Headache and photophobia for 3 days. EXAM: CT HEAD WITHOUT CONTRAST TECHNIQUE: Contiguous axial images were obtained from the base of the skull through the vertex without intravenous contrast. COMPARISON:  11/24/2011 FINDINGS: Brain: No evidence of acute infarction, hemorrhage, hydrocephalus, extra-axial collection or mass lesion/mass effect. Gray matter and white matter are unremarkable, with normal differentiation. Brain volume is normal for age. Vascular: No hyperdense vessel or unexpected calcification. Skull: Normal. Negative for fracture or focal lesion. Sinuses/Orbits: No acute finding. IMPRESSION: Normal brain Electronically Signed   By: Andreas Newport M.D.   On: 01/09/2016 05:55    Procedures Procedures (including critical care time)  Medications Ordered in ED Medications  ondansetron (ZOFRAN-ODT) disintegrating tablet 8 mg (8 mg Oral Given 01/09/16 0348)  sodium chloride 0.9 % bolus 1,000 mL (0 mLs Intravenous Stopped 01/09/16 0722)  prochlorperazine (COMPAZINE) injection 5 mg (5 mg Intravenous Given 01/09/16 0617)  diphenhydrAMINE (BENADRYL) injection 12.5 mg (12.5 mg Intravenous Given 01/09/16 0615)     Initial Impression / Assessment and Plan / ED Course  I have reviewed the triage vital signs and the nursing notes.  Pertinent labs & imaging results that were available during my care of the patient were reviewed by me and considered in my medical decision making (see chart for details).  Clinical Course    Patient presents with headache. History of migraines. Only additional symptom today is slurred speech and ordered finding difficulty which is obvious on exam. This is new. She is out of the window for TPA. Patient given medication for her migraine. Will obtain imaging.  CT negative. Patient resting comfortably. Will obtain  MRI. MRI pending.  Final Clinical Impressions(s) / ED Diagnoses   Final diagnoses:  None    New Prescriptions New Prescriptions   No medications on file     Merryl Hacker, MD 01/09/16 [phone]

## 2016-01-09 NOTE — ED Notes (Signed)
Patient d/c'd from IV, continuous pulse oximetry and blood pressure cuff; patient getting dressed to be discharged home; visitor at bedside

## 2016-01-09 NOTE — ED Notes (Signed)
Pt's family states she is a "very hard stick" and always has to have the "iv specialists" start her IVs.  Would prefer IV team be consulted at this time.

## 2016-01-13 ENCOUNTER — Telehealth: Payer: Self-pay | Admitting: Cardiovascular Disease

## 2016-01-13 NOTE — Telephone Encounter (Signed)
[name] called to report that her BP has remained high over recent days, averaging in the [id number] systolic but she has also seen readings in the [id number] systolic.  In reviewing her medication list, she has not be taking her HCTZ as prescribed but instead on a PRN basis.  She will start taking her HCTZ 25mg  PO QDAY as prescribed including first dose tonight, and also take her Metoprolol and Lisinopril as prescribed.  [name] is also going to call Dr. Oleh Genin office in the morning to schedule an appointment.  Clayborne Dana MD

## 2016-01-21 ENCOUNTER — Telehealth: Payer: Self-pay | Admitting: Physician Assistant

## 2016-01-21 ENCOUNTER — Ambulatory Visit (INDEPENDENT_AMBULATORY_CARE_PROVIDER_SITE_OTHER): Payer: Medicare Other | Admitting: Physician Assistant

## 2016-01-21 ENCOUNTER — Encounter: Payer: Self-pay | Admitting: Physician Assistant

## 2016-01-21 ENCOUNTER — Other Ambulatory Visit: Payer: Self-pay | Admitting: *Deleted

## 2016-01-21 VITALS — BP 136/60 | HR 54 | Ht 61.0 in | Wt 198.8 lb

## 2016-01-21 DIAGNOSIS — R002 Palpitations: Secondary | ICD-10-CM | POA: Diagnosis not present

## 2016-01-21 DIAGNOSIS — R519 Headache, unspecified: Secondary | ICD-10-CM

## 2016-01-21 DIAGNOSIS — I10 Essential (primary) hypertension: Secondary | ICD-10-CM

## 2016-01-21 DIAGNOSIS — G8929 Other chronic pain: Secondary | ICD-10-CM

## 2016-01-21 DIAGNOSIS — R51 Headache: Secondary | ICD-10-CM

## 2016-01-21 DIAGNOSIS — E7800 Pure hypercholesterolemia, unspecified: Secondary | ICD-10-CM | POA: Diagnosis not present

## 2016-01-21 DIAGNOSIS — R0602 Shortness of breath: Secondary | ICD-10-CM

## 2016-01-21 MED ORDER — LISINOPRIL 10 MG PO TABS: 10.0000 mg | ORAL_TABLET | Freq: Every evening | ORAL | 3 refills | Status: DC

## 2016-01-21 MED ORDER — PRAVASTATIN SODIUM 20 MG PO TABS: 20.0000 mg | ORAL_TABLET | Freq: Every evening | ORAL | 3 refills | Status: DC

## 2016-01-21 NOTE — Progress Notes (Signed)
That would be fine 
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
## 2016-01-21 NOTE — Telephone Encounter (Signed)
Please call her and make sure she knows that I wanted her to take: HCTZ and Metoprolol in the morning Lisinopril and Metoprolol in the evening  Take medications 12 hours apart. Thanks, Richardson Dopp, PA-C   01/21/2016 1:23 PM

## 2016-01-21 NOTE — Telephone Encounter (Signed)
I did call the pt per request of Brynda Rim. PA to make sure pt knows to make sure to take HCTZ and Metoprolol in the AM and to take Lisinopril and Metoprolol in the PM and to make sure these are spaced out 12 hours apart. Pt verified yes she understands these instructions and said it was very nice of [place] to call and check on her.

## 2016-01-21 NOTE — Patient Instructions (Addendum)
Medication Instructions:  1. STOP ATORVASTATIN 2. START PRAVASTATIN 20 MG AT BEDTIME 3. CHANGE LISINOPRIL 10 MG TO BEDITIME  Labwork: 1. TODAY BMET, MAG LEVEL, TSH AND A 24 HOUR URINE FOR CATECHOLAMINES AND METANEPHRINE'S   2. YOU WILL NEED IN 3 MONTHS FASTING LIPID AND LIVER PANEL  Testing/Procedures: Your physician has requested that you have an echocardiogram. Echocardiography is a painless test that uses sound waves to create images of your heart. It provides your doctor with information about the size and shape of your heart and how well your heart's chambers and valves are working. This procedure takes approximately one hour. There are no restrictions for this procedure.  Follow-Up: DR. END IN 1-2 MONTHS  Any Other Special Instructions Will Be Listed Below (If Applicable).  If you need a refill on your cardiac medications before your next appointment, please call your pharmacy.

## 2016-01-22 ENCOUNTER — Telehealth: Payer: Self-pay | Admitting: *Deleted

## 2016-01-22 DIAGNOSIS — G44021 Chronic cluster headache, intractable: Secondary | ICD-10-CM | POA: Diagnosis not present

## 2016-01-22 LAB — MAGNESIUM: MAGNESIUM: 2.1 mg/dL (ref 1.5–2.5)

## 2016-01-22 LAB — BASIC METABOLIC PANEL
BUN: 19 mg/dL (ref 7–25)
CHLORIDE: 99 mmol/L (ref 98–110)
CO2: 27 mmol/L (ref 20–31)
Calcium: 9.3 mg/dL (ref 8.6–10.4)
Creat: 0.93 mg/dL (ref 0.60–0.93)
Glucose, Bld: 84 mg/dL (ref 65–99)
POTASSIUM: 4.3 mmol/L (ref 3.5–5.3)
SODIUM: 138 mmol/L (ref 135–146)

## 2016-01-22 LAB — TSH: TSH: 2.75 m[IU]/L

## 2016-01-22 NOTE — Telephone Encounter (Signed)
Pt notified of lab results by phone with verbal understanding.  

## 2016-01-28 DIAGNOSIS — E538 Deficiency of other specified B group vitamins: Secondary | ICD-10-CM | POA: Diagnosis not present

## 2016-02-08 DIAGNOSIS — R079 Chest pain, unspecified: Secondary | ICD-10-CM

## 2016-02-08 HISTORY — DX: Chest pain, unspecified: R07.9

## 2016-02-10 ENCOUNTER — Encounter (HOSPITAL_COMMUNITY): Payer: Self-pay | Admitting: Emergency Medicine

## 2016-02-10 DIAGNOSIS — I10 Essential (primary) hypertension: Secondary | ICD-10-CM | POA: Diagnosis not present

## 2016-02-10 DIAGNOSIS — G43809 Other migraine, not intractable, without status migrainosus: Secondary | ICD-10-CM | POA: Diagnosis not present

## 2016-02-10 DIAGNOSIS — Z7982 Long term (current) use of aspirin: Secondary | ICD-10-CM | POA: Insufficient documentation

## 2016-02-10 DIAGNOSIS — Z5181 Encounter for therapeutic drug level monitoring: Secondary | ICD-10-CM | POA: Diagnosis not present

## 2016-02-10 DIAGNOSIS — R51 Headache: Secondary | ICD-10-CM | POA: Diagnosis present

## 2016-02-10 DIAGNOSIS — Z79899 Other long term (current) drug therapy: Secondary | ICD-10-CM | POA: Diagnosis not present

## 2016-02-10 DIAGNOSIS — R930 Abnormal findings on diagnostic imaging of skull and head, not elsewhere classified: Secondary | ICD-10-CM | POA: Diagnosis not present

## 2016-02-10 NOTE — ED Triage Notes (Addendum)
Pt c/o L sided headache and expressive aphasia.  Husband reports symptoms ongoing for the last 3 days and are getting worse.  States she was seen 3 weeks ago for same.  No arm drift.  No facial droop.  No leg drift.  Speech clear- just having difficulty finding the right words.  Pt thinks symptoms are related to taking 1/2 of a Reglan.

## 2016-02-11 ENCOUNTER — Emergency Department (HOSPITAL_COMMUNITY): Payer: Medicare Other

## 2016-02-11 ENCOUNTER — Emergency Department (HOSPITAL_COMMUNITY)
Admission: EM | Admit: 2016-02-11 | Discharge: 2016-02-11 | Disposition: A | Discharge status: Home / Self Care | Payer: Medicare Other | Attending: Emergency Medicine | Admitting: Emergency Medicine

## 2016-02-11 DIAGNOSIS — R930 Abnormal findings on diagnostic imaging of skull and head, not elsewhere classified: Secondary | ICD-10-CM | POA: Diagnosis not present

## 2016-02-11 DIAGNOSIS — I10 Essential (primary) hypertension: Secondary | ICD-10-CM | POA: Diagnosis not present

## 2016-02-11 DIAGNOSIS — G43809 Other migraine, not intractable, without status migrainosus: Secondary | ICD-10-CM

## 2016-02-11 LAB — CBC
HCT: 43.9 % (ref 36.0–46.0)
HEMOGLOBIN: 15.5 g/dL — AB (ref 12.0–15.0)
MCH: 29.1 pg (ref 26.0–34.0)
MCHC: 35.3 g/dL (ref 30.0–36.0)
MCV: 82.5 fL (ref 78.0–100.0)
PLATELETS: 275 10*3/uL (ref 150–400)
RBC: 5.32 MIL/uL — AB (ref 3.87–5.11)
RDW: 13.1 % (ref 11.5–15.5)
WBC: 9 10*3/uL (ref 4.0–10.5)

## 2016-02-11 LAB — DIFFERENTIAL
BASOS ABS: 0.1 10*3/uL (ref 0.0–0.1)
Basophils Relative: 1 %
EOS ABS: 0.2 10*3/uL (ref 0.0–0.7)
Eosinophils Relative: 2 %
LYMPHS PCT: 33 %
Lymphs Abs: 3 10*3/uL (ref 0.7–4.0)
MONO ABS: 0.8 10*3/uL (ref 0.1–1.0)
Monocytes Relative: 9 %
NEUTROS PCT: 55 %
Neutro Abs: 4.9 10*3/uL (ref 1.7–7.7)
SMEAR REVIEW: ADEQUATE

## 2016-02-11 LAB — COMPREHENSIVE METABOLIC PANEL
ALBUMIN: 4.1 g/dL (ref 3.5–5.0)
ALT: 15 U/L (ref 14–54)
AST: 22 U/L (ref 15–41)
Alkaline Phosphatase: 55 U/L (ref 38–126)
Anion gap: 14 (ref 5–15)
BUN: 18 mg/dL (ref 6–20)
CHLORIDE: 96 mmol/L — AB (ref 101–111)
CO2: 23 mmol/L (ref 22–32)
CREATININE: 1.06 mg/dL — AB (ref 0.44–1.00)
Calcium: 9.6 mg/dL (ref 8.9–10.3)
GFR calc Af Amer: 58 mL/min — ABNORMAL LOW (ref >60)
GFR, EST NON AFRICAN AMERICAN: 50 mL/min — AB (ref >60)
Glucose, Bld: 120 mg/dL — ABNORMAL HIGH (ref 65–99)
POTASSIUM: 3.7 mmol/L (ref 3.5–5.1)
SODIUM: 133 mmol/L — AB (ref 135–145)
Total Bilirubin: 1.5 mg/dL — ABNORMAL HIGH (ref 0.3–1.2)
Total Protein: 6.7 g/dL (ref 6.5–8.1)

## 2016-02-11 LAB — APTT: APTT: 30 s (ref 24–36)

## 2016-02-11 LAB — I-STAT TROPONIN, ED: TROPONIN I, POC: 0 ng/mL (ref 0.00–0.08)

## 2016-02-11 LAB — PROTIME-INR
INR: 0.86
PROTHROMBIN TIME: 11.7 s (ref 11.4–15.2)

## 2016-02-11 MED ORDER — ONDANSETRON HCL 4 MG/2ML IJ SOLN
4.0000 mg | Freq: Once | INTRAMUSCULAR | Status: AC
Start: 2016-02-11 — End: 2016-02-11
  Administered 2016-02-11: 4 mg via INTRAVENOUS
  Filled 2016-02-11: qty 2

## 2016-02-11 MED ORDER — MORPHINE SULFATE (PF) 4 MG/ML IV SOLN
4.0000 mg | Freq: Once | INTRAVENOUS | Status: AC
  Administered 2016-02-11: 4 mg via INTRAVENOUS
  Filled 2016-02-11: qty 1

## 2016-02-11 MED ORDER — SODIUM CHLORIDE 0.9 % IV BOLUS (SEPSIS)
500.0000 mL | Freq: Once | INTRAVENOUS | Status: AC
  Administered 2016-02-11: 500 mL via INTRAVENOUS

## 2016-02-11 MED ORDER — KETOROLAC TROMETHAMINE 30 MG/ML IJ SOLN
30.0000 mg | Freq: Once | INTRAMUSCULAR | Status: AC
  Administered 2016-02-11: 30 mg via INTRAVENOUS
  Filled 2016-02-11: qty 1

## 2016-02-11 NOTE — ED Provider Notes (Signed)
[facility] DEPT Provider Note   CSN: [id number] Arrival date & time: 02/10/16  2330  By signing my name below, I, Neta Mends, attest that this documentation has been prepared under the direction and in the presence of Jola Schmidt, MD . Electronically Signed: Neta Mends, ED Scribe. 02/11/2016. 1:51 AM.    History   Chief Complaint Chief Complaint  Patient presents with  . Headache  . expresssive aphasia     The history is provided by the patient. No language interpreter was used.   HPI Comments:  [name] is a 75 y.o. female with PMHx of HTN and migraines who presents to the Emergency Department complaining of a constant headache that began 3 hours ago. Pt reports that the headache is primarily left-sided. Pt states that her headache is similar to her previous migraines, and states that she has had 3 similar headaches over the past 3 weeks. Pt states that she did not have headaches like this for the past 2 years. Pt complains of associated nausea, tingling in her toes, and photophobia. Husband reports that pt was here in the ED for the same 3 weeks ago and was slurring her speech, but is not slurring her speech today; pt had an MRI and CT at this visit that were negative. Pt sees Dr. Domingo Cocking for her headaches. Pt has taken Reglan with no relief. Pt denies fever, URI symptoms, vomiting.   Past Medical History:  Diagnosis Date  . Arthritis    "got alot; especially in my spine"  . Chronic back pain    "all of it"  . Diverticulosis of colon (without mention of hemorrhage)   . DVT of leg (deep venous thrombosis) ([place]) 09/2011   LLE  . Dysrhythmia    notes in epic  . Esophageal reflux   . Family history of malignant neoplasm of gastrointestinal tract   . Fibromyalgia    "dx'd w/it when it first came out"  . Hiatal hernia   . Hyperlipidemia   . Hypertension   . Migraines 09/24/11   "often recently;  more controlled now"  . Obesity, unspecified   .  Osteoporosis   . Palpitations    event monitor 8/13: NSR, Sinus brady, Sinus Tachy, PAC  . Personal history of colonic polyps 05/31/2007   adenomatous polyp  . Pneumonia    "3 times at least" (09/24/11)  . Rheumatoid arthritis(714.0)   . Scoliosis   . Stricture and stenosis of esophagus   . Unspecified gastritis and gastroduodenitis without mention of hemorrhage     Patient Active Problem List   Diagnosis Date Noted  . Leg pain 01/28/2013  . Autonomic orthostatic hypotension 11/25/2011  . Nausea 09/25/2011  . Tachycardia, paroxysmal ([place]) 09/24/2011  . DVT (deep venous thrombosis) ([place]) 09/24/2011  . Hyponatremia 09/24/2011  . Hypertension 05/13/2011  . Exertional dyspnea 04/23/2011  . Palpitations 04/23/2011  . Hyperlipidemia 04/23/2011  . Personal history of colonic polyps 02/06/2011  . Family history of malignant neoplasm of gastrointestinal tract 02/06/2011  . Status post partial resection of colon 02/06/2011  . Obesity 02/06/2011  . EXOGENOUS OBESITY 07/01/2007  . GERD 07/01/2007  . HEADACHE, CHRONIC 07/01/2007  . Hx of adenomatous polyp of colon 06/02/2007  . GASTRITIS 05/31/2007  . HIATAL HERNIA 05/31/2007  . DIVERTICULOSIS, COLON 05/31/2007  . ESOPHAGEAL STRICTURE 02/18/2002    Past Surgical History:  Procedure Laterality Date  . BACK SURGERY    . BREAST CYST INCISION AND DRAINAGE  2012  left; "golf-ball sized"  . BREAST SURGERY     cyst lanced and drainded  . COLON SURGERY    . COLONOSCOPY  2009  . ESOPHAGEAL DILATION     "several times"  . EXPLORATORY LAPAROTOMY  04/2000   "for ruptured colon"  . HEMATOMA EVACUATION  09/2011   LLE  . LUMBAR LAMINECTOMY/DECOMPRESSION MICRODISCECTOMY  09/03/2011   Procedure: LUMBAR LAMINECTOMY/DECOMPRESSION MICRODISCECTOMY 2 LEVELS;  Surgeon: Elaina Hoops, MD;  Location: [facility];  Service: Neurosurgery;  Laterality: Left;  Left Lumbar Three-Four, Lumbar Four-Five Decompressive Laminectomy  . SALIVARY GLAND SURGERY  ~  1980   1 removed  . SHOULDER SURGERY  2008   right shoulder and collar bone  . SIGMOID RESECTION / RECTOPEXY  09/1999   Dr Rosana Hoes  . TRIGGER FINGER RELEASE  2004   left thumb  . TUMOR REMOVAL  1960's   left leg  . UPPER GASTROINTESTINAL ENDOSCOPY      OB History    No data available       Home Medications    Prior to Admission medications   Medication Sig Start Date End Date Taking? Authorizing Provider  aspirin EC 81 MG tablet Take 81 mg by mouth daily.   Yes Historical Provider, MD  Cyanocobalamin (VITAMIN B-12 IJ) Inject as directed every 30 (thirty) days.   Yes Historical Provider, MD  fluticasone (FLONASE) 50 MCG/ACT nasal spray Place 1 spray into both nostrils daily as needed for allergies or rhinitis.    Yes Historical Provider, MD  hydrochlorothiazide (HYDRODIURIL) 25 MG tablet Take 1 tablet (25 mg total) by mouth daily. 03/23/15  Yes Scott Joylene Draft, PA-C  lisinopril (PRINIVIL,ZESTRIL) 10 MG tablet Take 1 tablet (10 mg total) by mouth every evening. 01/21/16 04/20/16 Yes Scott T Weaver, PA-C  metoCLOPramide (REGLAN) 10 MG tablet Take 5-10 mg by mouth every 8 (eight) hours as needed (for migraine). For migraine headache   Yes Historical Provider, MD  metoprolol (LOPRESSOR) 50 MG tablet Take (1) one tablet by mouth twice daily. 03/23/15  Yes Scott Joylene Draft, PA-C  oxyCODONE-acetaminophen (TYLOX) 5-500 MG per capsule Take 1 capsule by mouth every 4 (four) hours as needed. For migraine headaches   Yes Historical Provider, MD  pravastatin (PRAVACHOL) 20 MG tablet Take 1 tablet (20 mg total) by mouth every evening. 01/21/16 04/20/16 Yes Scott T Weaver, PA-C  esomeprazole (NEXIUM) 40 MG capsule Take one capsule 30 minutes before breakfast and supper daily. Patient not taking: Reported on 02/11/2016 03/13/14   Gatha Mayer, MD    Family History Family History  Problem Relation Age of Onset  . Stroke Mother   . Hypertension Mother   . Stroke Father   . Hypertension Father   .  Diabetes Father   . Pancreatic cancer Sister     63 at death  . Diabetes Sister     x 2  . Colon cancer Sister 3  . Cancer Sister   . Heart attack Neg Hx     Social History Social History  Substance Use Topics  . Smoking status: Never Smoker  . Smokeless tobacco: Never Used  . Alcohol use No     Allergies   Patient has no known allergies.   Review of Systems Review of Systems 10 Systems reviewed and are negative for acute change except as noted in the HPI.   Physical Exam Updated Vital Signs BP 167/75 (BP Location: Left Arm)   Pulse 72   Temp 97.9 F (36.6  C) (Oral)   Resp 20   Ht 5' (1.524 m)   Wt 195 lb (88.5 kg)   SpO2 100%   BMI 38.08 kg/m   Physical Exam  Constitutional: She is oriented to person, place, and time. She appears well-developed and well-nourished. No distress.  HENT:  Head: Normocephalic and atraumatic.  Eyes: EOM are normal. Pupils are equal, round, and reactive to light.  Neck: Normal range of motion.  Cardiovascular: Normal rate, regular rhythm and normal heart sounds.   Pulmonary/Chest: Effort normal and breath sounds normal.  Abdominal: Soft. She exhibits no distension. There is no tenderness.  Musculoskeletal: Normal range of motion.  Neurological: She is alert and oriented to person, place, and time.  5/5 strength in major muscle groups of  bilateral upper and lower extremities. Speech normal. No facial asymetry.   Skin: Skin is warm and dry.  Psychiatric: She has a normal mood and affect. Judgment normal.  Nursing note and vitals reviewed.    ED Treatments / Results  DIAGNOSTIC STUDIES:  Oygen Saturation is 100% on RA, normal by my interpretation.    COORDINATION OF CARE:  1:51 AM Discussed treatment plan with pt at bedside and pt agreed to plan.   Labs (all labs ordered are listed, but only abnormal results are displayed) Labs Reviewed  CBC - Abnormal; Notable for the following:       Result Value   RBC 5.32 (*)     Hemoglobin 15.5 (*)    All other components within normal limits  COMPREHENSIVE METABOLIC PANEL - Abnormal; Notable for the following:    Sodium 133 (*)    Chloride 96 (*)    Glucose, Bld 120 (*)    Creatinine, Ser 1.06 (*)    Total Bilirubin 1.5 (*)    GFR calc non Af Amer 50 (*)    GFR calc Af Amer 58 (*)    All other components within normal limits  PROTIME-INR  APTT  DIFFERENTIAL  I-STAT TROPOININ, ED    EKG  EKG Interpretation  Date/Time:  Sunday February 10 2016 23:48:20 EST Ventricular Rate:  66 PR Interval:  164 QRS Duration: 84 QT Interval:  410 QTC Calculation: 429 R Axis:   25 Text Interpretation:  Normal sinus rhythm Cannot rule out Anterior infarct , age undetermined Abnormal ECG No significant change was found Confirmed by Soham Hollett  MD, Lennette Bihari ([id number]) on 02/11/2016 1:50:41 AM       Radiology Ct Head Wo Contrast  Result Date: 02/11/2016 CLINICAL DATA:  Symptoms of cerebrovascular accident. Initial encounter. EXAM: CT HEAD WITHOUT CONTRAST TECHNIQUE: Contiguous axial images were obtained from the base of the skull through the vertex without intravenous contrast. COMPARISON:  CT of the head and MRI of the brain performed 01/09/2016 FINDINGS: Brain: No evidence of acute infarction, hemorrhage, hydrocephalus, extra-axial collection or mass lesion/mass effect. Mild periventricular and subcortical white matter change may reflect small vessel ischemic microangiopathy. The posterior fossa, including the cerebellum, brainstem and fourth ventricle, is within normal limits. The third and lateral ventricles, and basal ganglia are unremarkable in appearance. The cerebral hemispheres are symmetric in appearance, with normal gray-white differentiation. No mass effect or midline shift is seen. Vascular: No hyperdense vessel or unexpected calcification. Skull: There is no evidence of fracture; visualized osseous structures are unremarkable in appearance. Sinuses/Orbits: The visualized  portions of the orbits are within normal limits. The paranasal sinuses and mastoid air cells are well-aerated. Other: No significant soft tissue abnormalities are seen. IMPRESSION: 1. No  acute intracranial pathology seen on CT. 2. Mild small vessel ischemic microangiopathy. Electronically Signed   By: Garald Balding M.D.   On: 02/11/2016 00:37    Procedures Procedures (including critical care time)  Medications Ordered in ED Medications  morphine 4 MG/ML injection 4 mg (4 mg Intravenous Given 02/11/16 0220)  ketorolac (TORADOL) 30 MG/ML injection 30 mg (30 mg Intravenous Given 02/11/16 0220)  ondansetron (ZOFRAN) injection 4 mg (4 mg Intravenous Given 02/11/16 0220)  sodium chloride 0.9 % bolus 500 mL (0 mLs Intravenous Stopped 02/11/16 0300)     Initial Impression / Assessment and Plan / ED Course  I have reviewed the triage vital signs and the nursing notes.  Pertinent labs & imaging results that were available during my care of the patient were reviewed by me and considered in my medical decision making (see chart for details).  Clinical Course     3:36 AM Patient feels much better at this time.  Nonspecific headache.  Likely migraine component to this.  Primary care and neurology follow-up.  She understands to return the emergency department for new or worsening symptoms.  Final Clinical Impressions(s) / ED Diagnoses   Final diagnoses:  None    New Prescriptions New Prescriptions   No medications on file   I personally performed the services described in this documentation, which was scribed in my presence. The recorded information has been reviewed and is accurate.        Jola Schmidt, MD 02/11/16 [phone]

## 2016-02-12 ENCOUNTER — Telehealth: Payer: Self-pay | Admitting: Physician Assistant

## 2016-02-12 ENCOUNTER — Emergency Department (HOSPITAL_COMMUNITY): Payer: Medicare Other

## 2016-02-12 ENCOUNTER — Observation Stay (HOSPITAL_COMMUNITY)
Admission: EM | Admit: 2016-02-12 | Discharge: 2016-02-15 | Disposition: A | Discharge status: Home / Self Care | Payer: Medicare Other | Attending: Internal Medicine | Admitting: Internal Medicine

## 2016-02-12 ENCOUNTER — Encounter (HOSPITAL_COMMUNITY): Payer: Self-pay | Admitting: Emergency Medicine

## 2016-02-12 DIAGNOSIS — R51 Headache: Secondary | ICD-10-CM | POA: Diagnosis not present

## 2016-02-12 DIAGNOSIS — R519 Headache, unspecified: Secondary | ICD-10-CM

## 2016-02-12 DIAGNOSIS — Z7982 Long term (current) use of aspirin: Secondary | ICD-10-CM | POA: Diagnosis not present

## 2016-02-12 DIAGNOSIS — Z79899 Other long term (current) drug therapy: Secondary | ICD-10-CM | POA: Diagnosis not present

## 2016-02-12 DIAGNOSIS — R0600 Dyspnea, unspecified: Secondary | ICD-10-CM | POA: Diagnosis not present

## 2016-02-12 DIAGNOSIS — R079 Chest pain, unspecified: Secondary | ICD-10-CM

## 2016-02-12 DIAGNOSIS — R072 Precordial pain: Secondary | ICD-10-CM | POA: Diagnosis not present

## 2016-02-12 DIAGNOSIS — R0789 Other chest pain: Principal | ICD-10-CM | POA: Insufficient documentation

## 2016-02-12 DIAGNOSIS — I10 Essential (primary) hypertension: Secondary | ICD-10-CM | POA: Diagnosis not present

## 2016-02-12 DIAGNOSIS — R0602 Shortness of breath: Secondary | ICD-10-CM | POA: Diagnosis not present

## 2016-02-12 HISTORY — DX: Chest pain, unspecified: R07.9

## 2016-02-12 LAB — BASIC METABOLIC PANEL
ANION GAP: 12 (ref 5–15)
BUN: 13 mg/dL (ref 6–20)
CALCIUM: 9.6 mg/dL (ref 8.9–10.3)
CO2: 22 mmol/L (ref 22–32)
Chloride: 93 mmol/L — ABNORMAL LOW (ref 101–111)
Creatinine, Ser: 0.91 mg/dL (ref 0.44–1.00)
Glucose, Bld: 97 mg/dL (ref 65–99)
Potassium: 3.8 mmol/L (ref 3.5–5.1)
SODIUM: 127 mmol/L — AB (ref 135–145)

## 2016-02-12 LAB — I-STAT TROPONIN, ED: TROPONIN I, POC: 0 ng/mL (ref 0.00–0.08)

## 2016-02-12 LAB — CBC
HCT: 41.7 % (ref 36.0–46.0)
HEMOGLOBIN: 14.7 g/dL (ref 12.0–15.0)
MCH: 28.2 pg (ref 26.0–34.0)
MCHC: 35.3 g/dL (ref 30.0–36.0)
MCV: 80 fL (ref 78.0–100.0)
Platelets: 259 10*3/uL (ref 150–400)
RBC: 5.21 MIL/uL — AB (ref 3.87–5.11)
RDW: 12.2 % (ref 11.5–15.5)
WBC: 7.5 10*3/uL (ref 4.0–10.5)

## 2016-02-12 LAB — BRAIN NATRIURETIC PEPTIDE: B Natriuretic Peptide: 127.4 pg/mL — ABNORMAL HIGH (ref 0.0–100.0)

## 2016-02-12 MED ORDER — MORPHINE SULFATE (PF) 4 MG/ML IV SOLN
4.0000 mg | Freq: Once | INTRAVENOUS | Status: AC
  Administered 2016-02-12: 4 mg via INTRAVENOUS
  Filled 2016-02-12: qty 1

## 2016-02-12 MED ORDER — SODIUM CHLORIDE 0.9 % IV BOLUS (SEPSIS)
1000.0000 mL | Freq: Once | INTRAVENOUS | Status: AC
  Administered 2016-02-12: 1000 mL via INTRAVENOUS

## 2016-02-12 MED ORDER — ONDANSETRON HCL 4 MG/2ML IJ SOLN
4.0000 mg | Freq: Once | INTRAMUSCULAR | Status: AC
  Administered 2016-02-12: 4 mg via INTRAVENOUS
  Filled 2016-02-12: qty 2

## 2016-02-12 MED ORDER — KETOROLAC TROMETHAMINE 30 MG/ML IJ SOLN
30.0000 mg | Freq: Once | INTRAMUSCULAR | Status: AC
  Administered 2016-02-12: 30 mg via INTRAVENOUS
  Filled 2016-02-12: qty 1

## 2016-02-12 NOTE — ED Provider Notes (Signed)
[facility] DEPT Provider Note   CSN: [id number] Arrival date & time: 02/12/16  1730     History   Chief Complaint Chief Complaint  Patient presents with  . Chest Pain    HPI [name] is a 75 y.o. female with a past medical history significant for DVT, diverticulosis, hypertension, hyperlipidemia, GI neoplasm, and severe migraines who presents with chest pain, shortness of breath, nausea, vomiting, and developed a severe headache while in triage. Patient is coming by her husband who together report most of the history. They reported the patient has had severe headaches for the last few weeks and they have been to this facility to other times for headaches. They report that she has had workups including CTs and MRI which were reportedly reassuring. Patient says that she was not having a headache today but did have worsened chest pain. She says that over the last several months she has had intermittent chest pain but today it worsened. She describes it as in her central, left chest. She describes it radiating towards her shoulder. She describes associated shortness of breath with nausea and vomiting. She said the nausea has been ongoing for the last few days. She said that she called her PCP who directed her to come to the ED for evaluation.   Therefore that while the patient was in triage getting her EKG and chest x-ray, she had development of headache. She says that the headache is in her left head, and her occiput. She describes the pain as 10 out of 10 severity and associated with photophobia. She reports continued nausea and spitting up emesis. She is very tearful and agitated. She is wearing glasses because of photophobia.   He denies fevers, chills, cough, abdominal pain, constipation, diarrhea, or dysuria. She denies any focal neurologic deficits.     The history is provided by the patient, medical records, a significant other and the spouse. No language interpreter was  used.  Headache   This is a recurrent problem. The current episode started 1 to 2 hours ago. The problem occurs constantly. The problem has not changed since onset.The headache is associated with bright light. The pain is located in the left unilateral region. The quality of the pain is described as throbbing and dull. The pain is at a severity of 10/10. The pain is severe. The pain does not radiate. Associated symptoms include chest pressure, shortness of breath, nausea and vomiting. Pertinent negatives include no fever, no near-syncope, no palpitations and no syncope. She has tried nothing for the symptoms.  Chest Pain   This is a recurrent problem. The current episode started 6 to 12 hours ago. The problem occurs constantly. The problem has not changed since onset.The pain is present in the substernal region and lateral region. The pain is at a severity of 6/10. The pain is moderate. The quality of the pain is described as pressure-like and heavy. The pain radiates to the left shoulder. The symptoms are aggravated by deep breathing and exertion. Associated symptoms include cough, headaches, nausea, shortness of breath and vomiting. Pertinent negatives include no abdominal pain, no back pain, no diaphoresis, no dizziness, no fever, no hemoptysis, no near-syncope, no numbness, no palpitations and no syncope. She has tried nothing for the symptoms. The treatment provided no relief.  Pertinent negatives for past medical history include no seizures.    Past Medical History:  Diagnosis Date  . Arthritis    "got alot; especially in my spine"  . Chronic back  pain    "all of it"  . Diverticulosis of colon (without mention of hemorrhage)   . DVT of leg (deep venous thrombosis) ([place]) 09/2011   LLE  . Dysrhythmia    notes in epic  . Esophageal reflux   . Family history of malignant neoplasm of gastrointestinal tract   . Fibromyalgia    "dx'd w/it when it first came out"  . Hiatal hernia   .  Hyperlipidemia   . Hypertension   . Migraines 09/24/11   "often recently;  more controlled now"  . Obesity, unspecified   . Osteoporosis   . Palpitations    event monitor 8/13: NSR, Sinus brady, Sinus Tachy, PAC  . Personal history of colonic polyps 05/31/2007   adenomatous polyp  . Pneumonia    "3 times at least" (09/24/11)  . Rheumatoid arthritis(714.0)   . Scoliosis   . Stricture and stenosis of esophagus   . Unspecified gastritis and gastroduodenitis without mention of hemorrhage     Patient Active Problem List   Diagnosis Date Noted  . Leg pain 01/28/2013  . Autonomic orthostatic hypotension 11/25/2011  . Nausea 09/25/2011  . Tachycardia, paroxysmal ([place]) 09/24/2011  . DVT (deep venous thrombosis) ([place]) 09/24/2011  . Hyponatremia 09/24/2011  . Hypertension 05/13/2011  . Exertional dyspnea 04/23/2011  . Palpitations 04/23/2011  . Hyperlipidemia 04/23/2011  . Personal history of colonic polyps 02/06/2011  . Family history of malignant neoplasm of gastrointestinal tract 02/06/2011  . Status post partial resection of colon 02/06/2011  . Obesity 02/06/2011  . EXOGENOUS OBESITY 07/01/2007  . GERD 07/01/2007  . HEADACHE, CHRONIC 07/01/2007  . Hx of adenomatous polyp of colon 06/02/2007  . GASTRITIS 05/31/2007  . HIATAL HERNIA 05/31/2007  . DIVERTICULOSIS, COLON 05/31/2007  . ESOPHAGEAL STRICTURE 02/18/2002    Past Surgical History:  Procedure Laterality Date  . BACK SURGERY    . BREAST CYST INCISION AND DRAINAGE  2012   left; "golf-ball sized"  . BREAST SURGERY     cyst lanced and drainded  . COLON SURGERY    . COLONOSCOPY  2009  . ESOPHAGEAL DILATION     "several times"  . EXPLORATORY LAPAROTOMY  04/2000   "for ruptured colon"  . HEMATOMA EVACUATION  09/2011   LLE  . LUMBAR LAMINECTOMY/DECOMPRESSION MICRODISCECTOMY  09/03/2011   Procedure: LUMBAR LAMINECTOMY/DECOMPRESSION MICRODISCECTOMY 2 LEVELS;  Surgeon: Elaina Hoops, MD;  Location: [facility];  Service:  Neurosurgery;  Laterality: Left;  Left Lumbar Three-Four, Lumbar Four-Five Decompressive Laminectomy  . SALIVARY GLAND SURGERY  ~ 1980   1 removed  . SHOULDER SURGERY  2008   right shoulder and collar bone  . SIGMOID RESECTION / RECTOPEXY  09/1999   Dr Rosana Hoes  . TRIGGER FINGER RELEASE  2004   left thumb  . TUMOR REMOVAL  1960's   left leg  . UPPER GASTROINTESTINAL ENDOSCOPY      OB History    No data available       Home Medications    Prior to Admission medications   Medication Sig Start Date End Date Taking? Authorizing Provider  aspirin EC 81 MG tablet Take 81 mg by mouth daily.    Historical Provider, MD  Cyanocobalamin (VITAMIN B-12 IJ) Inject as directed every 30 (thirty) days.    Historical Provider, MD  fluticasone (FLONASE) 50 MCG/ACT nasal spray Place 1 spray into both nostrils daily as needed for allergies or rhinitis.     Historical Provider, MD  hydrochlorothiazide (HYDRODIURIL) 25 MG tablet  Take 1 tablet (25 mg total) by mouth daily. 03/23/15   Liliane Shi, PA-C  lisinopril (PRINIVIL,ZESTRIL) 10 MG tablet Take 1 tablet (10 mg total) by mouth every evening. 01/21/16 04/20/16  Liliane Shi, PA-C  metoCLOPramide (REGLAN) 10 MG tablet Take 5-10 mg by mouth every 8 (eight) hours as needed (for migraine). For migraine headache    Historical Provider, MD  metoprolol (LOPRESSOR) 50 MG tablet Take (1) one tablet by mouth twice daily. 03/23/15   Liliane Shi, PA-C  oxyCODONE-acetaminophen (TYLOX) 5-500 MG per capsule Take 1 capsule by mouth every 4 (four) hours as needed. For migraine headaches    Historical Provider, MD  pravastatin (PRAVACHOL) 20 MG tablet Take 1 tablet (20 mg total) by mouth every evening. 01/21/16 04/20/16  Liliane Shi, PA-C    Family History Family History  Problem Relation Age of Onset  . Stroke Mother   . Hypertension Mother   . Stroke Father   . Hypertension Father   . Diabetes Father   . Pancreatic cancer Sister     52 at death  .  Diabetes Sister     x 2  . Colon cancer Sister 53  . Cancer Sister   . Heart attack Neg Hx     Social History Social History  Substance Use Topics  . Smoking status: Never Smoker  . Smokeless tobacco: Never Used  . Alcohol use No     Allergies   Patient has no known allergies.   Review of Systems Review of Systems  Constitutional: Negative for appetite change, chills, diaphoresis, fatigue and fever.  HENT: Negative for congestion.   Eyes: Positive for photophobia.  Respiratory: Positive for cough, chest tightness and shortness of breath. Negative for hemoptysis, choking, wheezing and stridor.   Cardiovascular: Positive for chest pain. Negative for palpitations, syncope and near-syncope.  Gastrointestinal: Positive for nausea and vomiting. Negative for abdominal distention, abdominal pain, constipation and diarrhea.  Genitourinary: Negative for dysuria, flank pain and frequency.  Musculoskeletal: Negative for back pain.  Skin: Negative for rash and wound.  Neurological: Positive for headaches. Negative for dizziness, seizures, facial asymmetry, light-headedness and numbness.  All other systems reviewed and are negative.    Physical Exam Updated Vital Signs BP 137/68   Pulse (!) 59   Temp 98.3 F (36.8 C) (Oral)   Resp 18   SpO2 98%   Physical Exam  Constitutional: She is oriented to person, place, and time. She appears well-developed and well-nourished. No distress.  HENT:  Head: Normocephalic and atraumatic.  Nose: Nose normal.  Mouth/Throat: Oropharynx is clear and moist. No oropharyngeal exudate.  Eyes: Conjunctivae and EOM are normal. Pupils are equal, round, and reactive to light. No scleral icterus.  Neck: Neck supple.  Cardiovascular: Normal rate, regular rhythm, normal heart sounds and intact distal pulses.   No murmur heard. Pulmonary/Chest: Effort normal and breath sounds normal. No respiratory distress. She has no wheezes. She has no rales. She  exhibits tenderness.  Abdominal: Soft. There is no tenderness.  Musculoskeletal: She exhibits no edema or tenderness.  Neurological: She is alert and oriented to person, place, and time. She displays normal reflexes. No cranial nerve deficit or sensory deficit. She exhibits normal muscle tone. Coordination normal.  Skin: Skin is warm and dry. Capillary refill takes less than 2 seconds. She is not diaphoretic.  Psychiatric: She has a normal mood and affect.  Nursing note and vitals reviewed.    ED Treatments / Results  Labs (  all labs ordered are listed, but only abnormal results are displayed) Labs Reviewed  BASIC METABOLIC PANEL - Abnormal; Notable for the following:       Result Value   Sodium 127 (*)    Chloride 93 (*)    All other components within normal limits  CBC - Abnormal; Notable for the following:    RBC 5.21 (*)    All other components within normal limits  BRAIN NATRIURETIC PEPTIDE - Abnormal; Notable for the following:    B Natriuretic Peptide 127.4 (*)    All other components within normal limits  Alphonzo Lemmings, ED    EKG  EKG Interpretation  Date/Time:  Tuesday February 12 2016 17:35:11 EST Ventricular Rate:  62 PR Interval:  190 QRS Duration: 78 QT Interval:  402 QTC Calculation: 408 R Axis:   128 Text Interpretation:  Normal sinus rhythm Left posterior fascicular block Cannot rule out Anterior infarct , age undetermined Abnormal ECG When compared to prior, no signficant changes were seen no STEMI Confirmed by Sherry Ruffing MD, Lateefa Crosby [phone]) on 02/12/2016 10:28:26 PM       Radiology Dg Chest 2 View  Result Date: 02/12/2016 CLINICAL DATA:  Chest pain. EXAM: CHEST  2 VIEW COMPARISON:  Radiographs of April 05, 2012. FINDINGS: The heart size and mediastinal contours are within normal limits. Both lungs are clear. No pneumothorax or pleural effusion is noted. Mild dextroscoliosis of lower thoracic spine is noted. IMPRESSION: No active  cardiopulmonary disease. Electronically Signed   By: Marijo Conception, M.D.   On: 02/12/2016 19:27    Procedures Procedures (including critical care time)  Medications Ordered in ED Medications  morphine 4 MG/ML injection 4 mg (4 mg Intravenous Given 02/12/16 2337)  ketorolac (TORADOL) 30 MG/ML injection 30 mg (30 mg Intravenous Given 02/12/16 2337)  sodium chloride 0.9 % bolus 1,000 mL (1,000 mLs Intravenous New Bag/Given 02/12/16 2336)  ondansetron (ZOFRAN) injection 4 mg (4 mg Intravenous Given 02/12/16 2336)  morphine 4 MG/ML injection 4 mg (4 mg Intravenous Given 02/13/16 0030)     Initial Impression / Assessment and Plan / ED Course  I have reviewed the triage vital signs and the nursing notes.  Pertinent labs & imaging results that were available during my care of the patient were reviewed by me and considered in my medical decision making (see chart for details).  Clinical Course     GRIZEL FORYS is a 75 y.o. female with a past medical history significant for DVT, diverticulosis, hypertension, hyperlipidemia, GI neoplasm, and severe migraines who presents with chest pain, shortness of breath, nausea, vomiting, and developed a severe headache while in triage.  History and exam are seen above.  Patient is a very difficult historian and had to be constantly redirected to obtain any information. On exam, patient is tearful and crying. Patient is holding the left side of her head and pain. Her lungs are clear. Her chest is slightly tender to palpation. Her abdomen is nontender. She is able to follow all commands and has no focal neurologic deficits. Normal strength, sensation and coordination on exam. Patient did not have significant leg edema on exam. Patient reported some left leg tenderness.  EKG showed no significant changes from prior however there were inverted T waves. No STEMI.   Heart score calculated as a 5 on the description of symptoms, patient's age, and EKG  findings.  Given patient's history of DVT and her chest pain with shortness of breath, patient will need  a CT PE study to look for embolism. Patient will also have troponins, chest x-ray, and other laboratory testing. As patient has no focal neurologic deficits and, as the patient has had 2 CT scans and a head MRI in the last week, do not feel patient needs repeat imaging at this time. Patient will be given the combination of headache medications that improved her headache last visit.  Ultrasound IV was placed by me as described above after nursing and IV team was unable to obtain access.  Patient is currently awaiting results of diagnostic workup and PE study. After workup is completed, feel patient will require admission due to high risk chest pain with elevated heart score. Patient will also need further management of her headache as this is her third visit for uncontrolled headache complaint.   Care transferred to Dr. Roxanne Mins while awaiting results of diagnostic imaging.     Final Clinical Impressions(s) / ED Diagnoses   Final diagnoses:  Chest pain, unspecified type  Acute intractable headache, unspecified headache type     Clinical Impression: 1. Chest pain, unspecified type   2. Acute intractable headache, unspecified headache type     Disposition: Transferred care to Dr. Roxanne Mins while awaiting PE study before likely admission for CP workup.   Condition: Good     Gwenyth Allegra Billey Wojciak, MD 02/13/16 [phone]

## 2016-02-12 NOTE — Telephone Encounter (Signed)
Agree. Evaluate in ED. Richardson Dopp, PA-C   02/12/2016 4:53 PM

## 2016-02-12 NOTE — Telephone Encounter (Signed)
Pt c/o chest pain throughout the day, nausea, SOB, fluctuating BP, and feels weak. She states she does not have NTG to take. I advised her to call 911 or go to ED for eval of symptoms. She voiced understanding and agreed with plan.

## 2016-02-12 NOTE — Telephone Encounter (Signed)
New message  [name] has recommended a 30 day event monitor  Please call pt and advise

## 2016-02-12 NOTE — ED Triage Notes (Signed)
Pt sts left sided CP x 2 days; pt sts seen here x 2 this week for migraine HA

## 2016-02-12 NOTE — Telephone Encounter (Signed)
New message  Chest pains  1. Not having chest pains now/30 min ago 2. Nausea 3-4 days, SOB, dry heaves, BP goes up briefly 3. Going on for some time, more so this week 4. Come and go 5. Does not have nitro  Pt has really bad migraine headaches

## 2016-02-12 NOTE — Telephone Encounter (Signed)
See 02/12/16 @ 4:32 pm phone note from [facility], LPN pt to be evaluated in ED for chest pain.

## 2016-02-12 NOTE — ED Notes (Signed)
Main ab contacted to add on bnp

## 2016-02-12 NOTE — ED Notes (Signed)
Pt came to nurse first to advise she is having a migraine aura.  Is seeing zigzag lines.  This aura precedes onset of migraine.  No pain at this time.

## 2016-02-13 ENCOUNTER — Encounter (HOSPITAL_COMMUNITY): Payer: Self-pay | Admitting: Internal Medicine

## 2016-02-13 ENCOUNTER — Telehealth: Payer: Self-pay | Admitting: Neurology

## 2016-02-13 ENCOUNTER — Observation Stay (HOSPITAL_BASED_OUTPATIENT_CLINIC_OR_DEPARTMENT_OTHER): Payer: Medicare Other

## 2016-02-13 ENCOUNTER — Emergency Department (HOSPITAL_COMMUNITY): Payer: Medicare Other

## 2016-02-13 DIAGNOSIS — R51 Headache: Secondary | ICD-10-CM | POA: Diagnosis not present

## 2016-02-13 DIAGNOSIS — I10 Essential (primary) hypertension: Secondary | ICD-10-CM

## 2016-02-13 DIAGNOSIS — R079 Chest pain, unspecified: Secondary | ICD-10-CM | POA: Diagnosis present

## 2016-02-13 DIAGNOSIS — I208 Other forms of angina pectoris: Secondary | ICD-10-CM

## 2016-02-13 DIAGNOSIS — R0600 Dyspnea, unspecified: Secondary | ICD-10-CM | POA: Diagnosis not present

## 2016-02-13 LAB — T4, FREE: Free T4: 0.99 ng/dL (ref 0.61–1.12)

## 2016-02-13 LAB — MRSA PCR SCREENING: MRSA by PCR: NEGATIVE

## 2016-02-13 LAB — TROPONIN I

## 2016-02-13 LAB — ECHOCARDIOGRAM COMPLETE
HEIGHTINCHES: 60 in
Weight: 3249.6 oz

## 2016-02-13 LAB — BASIC METABOLIC PANEL
Anion gap: 8 (ref 5–15)
BUN: 11 mg/dL (ref 6–20)
CO2: 24 mmol/L (ref 22–32)
Calcium: 8.4 mg/dL — ABNORMAL LOW (ref 8.9–10.3)
Chloride: 96 mmol/L — ABNORMAL LOW (ref 101–111)
Creatinine, Ser: 0.91 mg/dL (ref 0.44–1.00)
GFR calc Af Amer: >60 mL/min (ref >60)
GLUCOSE: 87 mg/dL (ref 65–99)
POTASSIUM: 4 mmol/L (ref 3.5–5.1)
Sodium: 128 mmol/L — ABNORMAL LOW (ref 135–145)

## 2016-02-13 LAB — SODIUM, URINE, RANDOM: Sodium, Ur: 24 mmol/L

## 2016-02-13 LAB — OSMOLALITY: OSMOLALITY: 271 mosm/kg — AB (ref 275–295)

## 2016-02-13 LAB — PROTIME-INR
INR: 1.03
Prothrombin Time: 13.5 seconds (ref 11.4–15.2)

## 2016-02-13 LAB — TSH: TSH: 2.569 u[IU]/mL (ref 0.350–4.500)

## 2016-02-13 LAB — OSMOLALITY, URINE: Osmolality, Ur: 437 mOsm/kg (ref 300–900)

## 2016-02-13 MED ORDER — ACETAMINOPHEN 325 MG PO TABS: 650.0000 mg | ORAL_TABLET | ORAL | Status: DC | PRN

## 2016-02-13 MED ORDER — DIPHENHYDRAMINE HCL 50 MG/ML IJ SOLN
25.0000 mg | Freq: Once | INTRAMUSCULAR | Status: AC
  Administered 2016-02-13: 25 mg via INTRAVENOUS
  Filled 2016-02-13: qty 1

## 2016-02-13 MED ORDER — PROCHLORPERAZINE EDISYLATE 5 MG/ML IJ SOLN
10.0000 mg | Freq: Once | INTRAMUSCULAR | Status: AC
  Administered 2016-02-13: 10 mg via INTRAVENOUS
  Filled 2016-02-13: qty 2

## 2016-02-13 MED ORDER — HYDRALAZINE HCL 20 MG/ML IJ SOLN
10.0000 mg | INTRAMUSCULAR | Status: DC | PRN
Start: 2016-02-13 — End: 2016-02-15

## 2016-02-13 MED ORDER — METOPROLOL TARTRATE 50 MG PO TABS
50.0000 mg | ORAL_TABLET | Freq: Two times a day (BID) | ORAL | Status: DC
  Filled 2016-02-13: qty 1

## 2016-02-13 MED ORDER — METOPROLOL TARTRATE 25 MG PO TABS
25.0000 mg | ORAL_TABLET | Freq: Once | ORAL | Status: AC
Start: 2016-02-13 — End: 2016-02-13
  Administered 2016-02-13: 25 mg via ORAL

## 2016-02-13 MED ORDER — LISINOPRIL 10 MG PO TABS
10.0000 mg | ORAL_TABLET | Freq: Every evening | ORAL | Status: DC
  Administered 2016-02-13 – 2016-02-14 (×2): 10 mg via ORAL
  Filled 2016-02-13 (×2): qty 1

## 2016-02-13 MED ORDER — ONDANSETRON HCL 4 MG/2ML IJ SOLN: 4.0000 mg | Freq: Four times a day (QID) | INTRAMUSCULAR | Status: DC | PRN

## 2016-02-13 MED ORDER — IOPAMIDOL (ISOVUE-370) INJECTION 76%
INTRAVENOUS | Status: AC
  Administered 2016-02-13: 100 mL
  Filled 2016-02-13: qty 100

## 2016-02-13 MED ORDER — ASPIRIN EC 81 MG PO TBEC: 81.0000 mg | DELAYED_RELEASE_TABLET | Freq: Four times a day (QID) | ORAL | Status: DC | PRN

## 2016-02-13 MED ORDER — SODIUM CHLORIDE 0.9 % IV SOLN
INTRAVENOUS | Status: AC
  Administered 2016-02-13 (×2): via INTRAVENOUS

## 2016-02-13 MED ORDER — OXYCODONE-ACETAMINOPHEN 5-325 MG PO TABS: 1.0000 | ORAL_TABLET | ORAL | Status: DC | PRN

## 2016-02-13 MED ORDER — MORPHINE SULFATE (PF) 4 MG/ML IV SOLN
4.0000 mg | Freq: Once | INTRAVENOUS | Status: AC
  Administered 2016-02-13: 4 mg via INTRAVENOUS
  Filled 2016-02-13: qty 1

## 2016-02-13 MED ORDER — CALCIUM CARBONATE ANTACID 500 MG PO CHEW: 3.0000 | CHEWABLE_TABLET | Freq: Every day | ORAL | Status: DC | PRN

## 2016-02-13 MED ORDER — PRAVASTATIN SODIUM 20 MG PO TABS
20.0000 mg | ORAL_TABLET | Freq: Every evening | ORAL | Status: DC
  Administered 2016-02-13 – 2016-02-14 (×2): 20 mg via ORAL
  Filled 2016-02-13 (×2): qty 1

## 2016-02-13 MED ORDER — CARVEDILOL 6.25 MG PO TABS
6.2500 mg | ORAL_TABLET | Freq: Two times a day (BID) | ORAL | Status: DC
  Administered 2016-02-13 – 2016-02-14 (×3): 6.25 mg via ORAL
  Filled 2016-02-13 (×4): qty 1

## 2016-02-13 NOTE — Telephone Encounter (Signed)
Returned call and spoke to pt's husband who says that plans are to discharge her from hospital tomorrow. Let him know that there are not any available sooner appts at this time and that Dr. Jannifer Franklin would not be able to prescribe migraine medication prior to seeing the pt. Agreed to check w/ discharging physician on providing meds until new patient appt. Can call back for cancellations. Verbalized understanding and appreciation for call.
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
## 2016-02-13 NOTE — Progress Notes (Signed)
[name] is a 75 y.o. female with history of hypertension, hyperlipidemia was admitted for atypical chest pain, migraine headaches,.  She was admitted for evaluation of ACS. Cardiology consultation requested, recommended echocardiogram.  Further evaluation for hyponatremia in progress.  Continue to monitor.  Hosie Poisson, MD [phone]

## 2016-02-13 NOTE — Care Management Obs Status (Signed)
[facility] NOTIFICATION   Patient Details  Name: MADDILYN HEINSOHN MRN: [id number] Date of Birth: 1940/05/31   Medicare Observation Status Notification Given:  Yes    CrutchfieldAntony Haste, RN 02/13/2016, 10:56 AM

## 2016-02-13 NOTE — ED Notes (Signed)
Family refusing blood draw for protime INR.

## 2016-02-13 NOTE — ED Notes (Signed)
Patient transported to CT 

## 2016-02-13 NOTE — ED Provider Notes (Signed)
75 year old female initially evaluated by Dr. Gustavus Messing for chest pain and intractable headache. She's been in the ED several times in the past week with problems related to her headache. She also is having some dyspnea. She was signed out to me pending results of CT angiogram of chest. CT angiogram shows no evidence of pulmonary embolism. She continues to have headache and nausea but is chest pain-free. Case is discussed with Dr. Hal Hope of [facility] who agrees to admit the patient under observation status.  Results for orders placed or performed during the hospital encounter of [id number]  Basic metabolic panel  Result Value Ref Range   Sodium 127 (L) 135 - 145 mmol/L   Potassium 3.8 3.5 - 5.1 mmol/L   Chloride 93 (L) 101 - 111 mmol/L   CO2 22 22 - 32 mmol/L   Glucose, Bld 97 65 - 99 mg/dL   BUN 13 6 - 20 mg/dL   Creatinine, Ser 0.91 0.44 - 1.00 mg/dL   Calcium 9.6 8.9 - 10.3 mg/dL   GFR calc non Af Amer >60 >60 mL/min   GFR calc Af Amer >60 >60 mL/min   Anion gap 12 5 - 15  CBC  Result Value Ref Range   WBC 7.5 4.0 - 10.5 K/uL   RBC 5.21 (H) 3.87 - 5.11 MIL/uL   Hemoglobin 14.7 12.0 - 15.0 g/dL   HCT 41.7 36.0 - 46.0 %   MCV 80.0 78.0 - 100.0 fL   MCH 28.2 26.0 - 34.0 pg   MCHC 35.3 30.0 - 36.0 g/dL   RDW 12.2 11.5 - 15.5 %   Platelets 259 150 - 400 K/uL  Brain natriuretic peptide  Result Value Ref Range   B Natriuretic Peptide 127.4 (H) 0.0 - 100.0 pg/mL  Protime-INR  Result Value Ref Range   Prothrombin Time 13.5 11.4 - 15.2 seconds   INR 1.03   I-stat troponin, ED  Result Value Ref Range   Troponin i, poc 0.00 0.00 - 0.08 ng/mL   Comment 3           Dg Chest 2 View  Result Date: 02/12/2016 CLINICAL DATA:  Chest pain. EXAM: CHEST  2 VIEW COMPARISON:  Radiographs of April 05, 2012. FINDINGS: The heart size and mediastinal contours are within normal limits. Both lungs are clear. No pneumothorax or pleural effusion is noted. Mild dextroscoliosis of lower thoracic  spine is noted. IMPRESSION: No active cardiopulmonary disease. Electronically Signed   By: Marijo Conception, M.D.   On: 02/12/2016 19:27   Ct Head Wo Contrast  Result Date: 02/11/2016 CLINICAL DATA:  Symptoms of cerebrovascular accident. Initial encounter. EXAM: CT HEAD WITHOUT CONTRAST TECHNIQUE: Contiguous axial images were obtained from the base of the skull through the vertex without intravenous contrast. COMPARISON:  CT of the head and MRI of the brain performed 01/09/2016 FINDINGS: Brain: No evidence of acute infarction, hemorrhage, hydrocephalus, extra-axial collection or mass lesion/mass effect. Mild periventricular and subcortical white matter change may reflect small vessel ischemic microangiopathy. The posterior fossa, including the cerebellum, brainstem and fourth ventricle, is within normal limits. The third and lateral ventricles, and basal ganglia are unremarkable in appearance. The cerebral hemispheres are symmetric in appearance, with normal gray-white differentiation. No mass effect or midline shift is seen. Vascular: No hyperdense vessel or unexpected calcification. Skull: There is no evidence of fracture; visualized osseous structures are unremarkable in appearance. Sinuses/Orbits: The visualized portions of the orbits are within normal limits. The paranasal sinuses and mastoid air cells  are well-aerated. Other: No significant soft tissue abnormalities are seen. IMPRESSION: 1. No acute intracranial pathology seen on CT. 2. Mild small vessel ischemic microangiopathy. Electronically Signed   By: Garald Balding M.D.   On: 02/11/2016 00:37   Ct Angio Chest Pe W And/or Wo Contrast  Result Date: 02/13/2016 CLINICAL DATA:  Chest pain and dyspnea. EXAM: CT ANGIOGRAPHY CHEST WITH CONTRAST TECHNIQUE: Multidetector CT imaging of the chest was performed using the standard protocol during bolus administration of intravenous contrast. Multiplanar CT image reconstructions and MIPs were obtained to  evaluate the vascular anatomy. CONTRAST:  80 mL Isovue 370 intravenous COMPARISON:  09/24/2011 FINDINGS: Cardiovascular: Satisfactory opacification of the pulmonary arteries to the segmental level. No evidence of pulmonary embolism. Normal heart size. No pericardial effusion. Mediastinum/Nodes: No enlarged mediastinal, hilar, or axillary lymph nodes. Thyroid gland, trachea, and esophagus demonstrate no significant findings. Lungs/Pleura: Lungs are clear. No pleural effusion or pneumothorax. Upper Abdomen: Moderate hiatal hernia. No acute findings are evident. Musculoskeletal: No significant skeletal lesion. Incidental vertebral hemangioma at T11. Review of the MIP images confirms the above findings. IMPRESSION: Negative for pulmonary embolism or other acute abnormality. Moderate hiatal hernia. Electronically Signed   By: Andreas Newport M.D.   On: 02/13/2016 02:02   Images viewed by me.    Delora Fuel, MD [phone] [id number]

## 2016-02-13 NOTE — Telephone Encounter (Signed)
Noted  

## 2016-02-13 NOTE — H&P (Signed)
History and Physical    [name] [id number] DOB: Sep 30, 1940 DOA: 02/12/2016  PCP: [name], MD  Patient coming from: Home.  Chief Complaint: Chest pain.  HPI: [name] is a 75 y.o. female with history of hypertension, hyperlipidemia was advised to come to the ER after patient called her cardiology office. Patient is mildly confused after coming to the ER after patient started developing migraine headache. Patient states she had some chest pain yesterday but at this time is not able to clearly say how long it lasted and the character of the pain. Patient points to the left anterior chest wall. Since patient has had previous history of DVT CT angiogram of the chest was done which was negative for PE. Troponins were negative EKG was showing nonspecific findings.  While in the ER patient started developing headache typical of her migraine. Patient's headache is mostly in the left side of the head with photophobia and nausea and vomiting. Patient has chronic migraines and has not had any attacks last 2 years but over the last 2 weeks this is her third attack. Last visit to the ER patient had CT head 2 days ago which was unremarkable and patient's headache improved with Toradol and Benadryl. Patient appears nonfocal otherwise.   ED Course: Morphine Toradol were given.  Review of Systems: As per HPI, rest all negative.   Past Medical History:  Diagnosis Date  . Arthritis    "got alot; especially in my spine"  . Chronic back pain    "all of it"  . Diverticulosis of colon (without mention of hemorrhage)   . DVT of leg (deep venous thrombosis) ([place]) 09/2011   LLE  . Dysrhythmia    notes in epic  . Esophageal reflux   . Family history of malignant neoplasm of gastrointestinal tract   . Fibromyalgia    "dx'd w/it when it first came out"  . Hiatal hernia   . Hyperlipidemia   . Hypertension   . Migraines 09/24/11   "often recently;  more controlled now"  .  Obesity, unspecified   . Osteoporosis   . Palpitations    event monitor 8/13: NSR, Sinus brady, Sinus Tachy, PAC  . Personal history of colonic polyps 05/31/2007   adenomatous polyp  . Pneumonia    "3 times at least" (09/24/11)  . Rheumatoid arthritis(714.0)   . Scoliosis   . Stricture and stenosis of esophagus   . Unspecified gastritis and gastroduodenitis without mention of hemorrhage     Past Surgical History:  Procedure Laterality Date  . BACK SURGERY    . BREAST CYST INCISION AND DRAINAGE  2012   left; "golf-ball sized"  . BREAST SURGERY     cyst lanced and drainded  . COLON SURGERY    . COLONOSCOPY  2009  . ESOPHAGEAL DILATION     "several times"  . EXPLORATORY LAPAROTOMY  04/2000   "for ruptured colon"  . HEMATOMA EVACUATION  09/2011   LLE  . LUMBAR LAMINECTOMY/DECOMPRESSION MICRODISCECTOMY  09/03/2011   Procedure: LUMBAR LAMINECTOMY/DECOMPRESSION MICRODISCECTOMY 2 LEVELS;  Surgeon: Elaina Hoops, MD;  Location: [facility];  Service: Neurosurgery;  Laterality: Left;  Left Lumbar Three-Four, Lumbar Four-Five Decompressive Laminectomy  . SALIVARY GLAND SURGERY  ~ 1980   1 removed  . SHOULDER SURGERY  2008   right shoulder and collar bone  . SIGMOID RESECTION / RECTOPEXY  09/1999   Dr Rosana Hoes  . TRIGGER FINGER RELEASE  2004   left  thumb  . TUMOR REMOVAL  1960's   left leg  . UPPER GASTROINTESTINAL ENDOSCOPY       reports that she has never smoked. She has never used smokeless tobacco. She reports that she does not drink alcohol or use drugs.  Allergies  Allergen Reactions  . Reglan [Metoclopramide] Other (See Comments)    Speech is garbled, extreme trouble making words    Family History  Problem Relation Age of Onset  . Stroke Mother   . Hypertension Mother   . Stroke Father   . Hypertension Father   . Diabetes Father   . Pancreatic cancer Sister     67 at death  . Diabetes Sister     x 2  . Colon cancer Sister 75  . Cancer Sister   . Heart attack Neg Hx       Prior to Admission medications   Medication Sig Start Date End Date Taking? Authorizing Provider  aspirin EC 81 MG tablet Take 81 mg by mouth every 6 (six) hours as needed for moderate pain.    Yes Historical Provider, MD  calcium carbonate (TUMS EX) 750 MG chewable tablet Chew 2 tablets by mouth daily as needed for heartburn.    Yes Historical Provider, MD  Cyanocobalamin (VITAMIN B-12 IJ) Inject as directed every 30 (thirty) days.   Yes Historical Provider, MD  hydrochlorothiazide (HYDRODIURIL) 25 MG tablet Take 1 tablet (25 mg total) by mouth daily. 03/23/15  Yes Scott Joylene Draft, PA-C  lisinopril (PRINIVIL,ZESTRIL) 10 MG tablet Take 1 tablet (10 mg total) by mouth every evening. 01/21/16 04/20/16 Yes Scott T Weaver, PA-C  metoCLOPramide (REGLAN) 10 MG tablet Take 5-10 mg by mouth every 8 (eight) hours as needed (for migraine). For migraine headache   Yes Historical Provider, MD  metoprolol (LOPRESSOR) 50 MG tablet Take (1) one tablet by mouth twice daily. 03/23/15  Yes Liliane Shi, PA-C  oxyCODONE-acetaminophen (PERCOCET/ROXICET) 5-325 MG tablet Take 1 tablet by mouth every 4 (four) hours as needed for severe pain.   Yes Historical Provider, MD  pravastatin (PRAVACHOL) 20 MG tablet Take 1 tablet (20 mg total) by mouth every evening. 01/21/16 04/20/16 Yes Liliane Shi, PA-C    Physical Exam: Vitals:   02/13/16 0145 02/13/16 0200 02/13/16 0215 02/13/16 0230  BP: 159/73 153/67 161/66 152/64  Pulse: 71 67 71 68  Resp: 23 26 24  (!) 28  Temp:      TempSrc:      SpO2: 93% 100% 99% 97%      Constitutional: Moderately built and nourished. Vitals:   02/13/16 0145 02/13/16 0200 02/13/16 0215 02/13/16 0230  BP: 159/73 153/67 161/66 152/64  Pulse: 71 67 71 68  Resp: 23 26 24  (!) 28  Temp:      TempSrc:      SpO2: 93% 100% 99% 97%   Eyes: Anicteric. no pallor. ENMT: No discharge from the ears eyes nose and mouth. Neck: No mass felt. No neck rigidity. Respiratory: No rhonchi or  crepitations. Cardiovascular: S1 and S2 heard no murmurs appreciated. Abdomen: Soft nontender bowel sounds present. No guarding or rigidity. Musculoskeletal: No edema. No joint effusion. Skin: No skin rash. Skin appears warm. Neurologic: Alert awake mildly confused but oriented to name and place. Moves all extremities. Psychiatric: Mildly confused.   Labs on Admission: I have personally reviewed following labs and imaging studies  CBC:  Recent Labs Lab 02/10/16 2350 02/12/16 1745  WBC 9.0 7.5  NEUTROABS 4.9  --  HGB 15.5* 14.7  HCT 43.9 41.7  MCV 82.5 80.0  PLT 275 Q000111Q   Basic Metabolic Panel:  Recent Labs Lab 02/10/16 2350 02/12/16 1745  NA 133* 127*  K 3.7 3.8  CL 96* 93*  CO2 23 22  GLUCOSE 120* 97  BUN 18 13  CREATININE 1.06* 0.91  CALCIUM 9.6 9.6   GFR: Estimated Creatinine Clearance: 53.7 mL/min (by C-G formula based on SCr of 0.91 mg/dL). Liver Function Tests:  Recent Labs Lab 02/10/16 2350  AST 22  ALT 15  ALKPHOS 55  BILITOT 1.5*  PROT 6.7  ALBUMIN 4.1   No results for input(s): LIPASE, AMYLASE in the last 168 hours. No results for input(s): AMMONIA in the last 168 hours. Coagulation Profile:  Recent Labs Lab 02/10/16 2350 02/13/16 0033  INR 0.86 1.03   Cardiac Enzymes: No results for input(s): CKTOTAL, CKMB, CKMBINDEX, TROPONINI in the last 168 hours. BNP (last 3 results) No results for input(s): PROBNP in the last 8760 hours. HbA1C: No results for input(s): HGBA1C in the last 72 hours. CBG: No results for input(s): GLUCAP in the last 168 hours. Lipid Profile: No results for input(s): CHOL, HDL, LDLCALC, TRIG, CHOLHDL, LDLDIRECT in the last 72 hours. Thyroid Function Tests: No results for input(s): TSH, T4TOTAL, FREET4, T3FREE, THYROIDAB in the last 72 hours. Anemia Panel: No results for input(s): VITAMINB12, FOLATE, FERRITIN, TIBC, IRON, RETICCTPCT in the last 72 hours. Urine analysis:    Component Value Date/Time    COLORURINE YELLOW 01/09/2016 [place] 01/09/2016 0847   LABSPEC 1.013 01/09/2016 0847   PHURINE 7.5 01/09/2016 0847   GLUCOSEU NEGATIVE 01/09/2016 0847   HGBUR NEGATIVE 01/09/2016 0847   BILIRUBINUR NEGATIVE 01/09/2016 0847   KETONESUR 15 (A) 01/09/2016 0847   PROTEINUR NEGATIVE 01/09/2016 0847   UROBILINOGEN 1.0 11/13/2013 1232   NITRITE NEGATIVE 01/09/2016 0847   LEUKOCYTESUR NEGATIVE 01/09/2016 0847   Sepsis Labs: @LABRCNTIP (procalcitonin:4,lacticidven:4) )No results found for this or any previous visit (from the past 240 hour(s)).   Radiological Exams on Admission: Dg Chest 2 View  Result Date: 02/12/2016 CLINICAL DATA:  Chest pain. EXAM: CHEST  2 VIEW COMPARISON:  Radiographs of April 05, 2012. FINDINGS: The heart size and mediastinal contours are within normal limits. Both lungs are clear. No pneumothorax or pleural effusion is noted. Mild dextroscoliosis of lower thoracic spine is noted. IMPRESSION: No active cardiopulmonary disease. Electronically Signed   By: Marijo Conception, M.D.   On: 02/12/2016 19:27   Ct Angio Chest Pe W And/or Wo Contrast  Result Date: 02/13/2016 CLINICAL DATA:  Chest pain and dyspnea. EXAM: CT ANGIOGRAPHY CHEST WITH CONTRAST TECHNIQUE: Multidetector CT imaging of the chest was performed using the standard protocol during bolus administration of intravenous contrast. Multiplanar CT image reconstructions and MIPs were obtained to evaluate the vascular anatomy. CONTRAST:  80 mL Isovue 370 intravenous COMPARISON:  09/24/2011 FINDINGS: Cardiovascular: Satisfactory opacification of the pulmonary arteries to the segmental level. No evidence of pulmonary embolism. Normal heart size. No pericardial effusion. Mediastinum/Nodes: No enlarged mediastinal, hilar, or axillary lymph nodes. Thyroid gland, trachea, and esophagus demonstrate no significant findings. Lungs/Pleura: Lungs are clear. No pleural effusion or pneumothorax. Upper Abdomen: Moderate  hiatal hernia. No acute findings are evident. Musculoskeletal: No significant skeletal lesion. Incidental vertebral hemangioma at T11. Review of the MIP images confirms the above findings. IMPRESSION: Negative for pulmonary embolism or other acute abnormality. Moderate hiatal hernia. Electronically Signed   By: Andreas Newport M.D.   On:  02/13/2016 02:02    EKG: Independently reviewed. Normal sinus rhythm with nonspecific ST changes.  Assessment/Plan Principal Problem:   Chest pain Active Problems:   Acute intractable headache   Hypertension    1. Chest pain - patient at this time is not able to give a good history. So the character of the chest pain is not clear. CT angiogram is being negative for PE. We will cycle cardiac markers check 2-D echo control blood pressure and keep patient nothing by mouth in anticipation of cardiac procedure. Patient is on statins metoprolol and aspirin. Consult cardiologist in a.m. 2. Migraine headaches - CT head 2 days ago was showing nothing acute. I have discussed with Dr. Cheral Marker, on-call neurologist who advised to give Compazine 10 mg and Benadryl. If there is no much improvement, may need magnesium sulfate or Phenergan. Closely observe. 3. Hypertension - patient states her blood pressure has been uncontrolled last few weeks. Continue present medications including lisinopril metoprolol and I have placed patient on when necessary IV hydralazine. Closely follow blood pressure trends. I'm holding off patient's hydrochlorothiazide since patient is getting hydrated. 4. Hyperlipidemia on statins.   DVT prophylaxis: SCDs. Code Status: Full code.  Family Communication: Patient's son and husband.  Disposition Plan: Home.  Consults called: Discussed with neurologist.  Admission status: Observation.    Rise Patience MD [facility] Pager [phone].  If 7PM-7AM, please contact night-coverage [email or web address] Password [email or web address]  02/13/2016, 4:13 AM

## 2016-02-13 NOTE — Telephone Encounter (Signed)
Patient called and requested to speak with someone regarding medication. She is a new with Dr. Jannifer Franklin and is seeing him on the 18th but she would like to know if she can come in sooner or possibly get some medication. I informed her that there were no open apts but I would send a note over to the nurse regarding these issues. She is currently in the hospital at [facility] but would like a call back. Please call and advise.

## 2016-02-13 NOTE — Progress Notes (Signed)
Patient arrived from ED to 3W16. Currently having nausea, spit up some clear phlegm. States she also has a headache and photophobia. Paged [facility] of patient's arrival. MD Hal Hope called and gave a verbal order to give Compazine 10 mg IV once followed by Benadryl 10 mg IV once. Will continue to monitor patient.

## 2016-02-14 DIAGNOSIS — R51 Headache: Secondary | ICD-10-CM | POA: Diagnosis not present

## 2016-02-14 DIAGNOSIS — I10 Essential (primary) hypertension: Secondary | ICD-10-CM | POA: Diagnosis not present

## 2016-02-14 DIAGNOSIS — I208 Other forms of angina pectoris: Secondary | ICD-10-CM | POA: Diagnosis not present

## 2016-02-14 DIAGNOSIS — R079 Chest pain, unspecified: Secondary | ICD-10-CM | POA: Diagnosis not present

## 2016-02-14 LAB — BASIC METABOLIC PANEL
Anion gap: 7 (ref 5–15)
BUN: 12 mg/dL (ref 6–20)
CHLORIDE: 99 mmol/L — AB (ref 101–111)
CO2: 25 mmol/L (ref 22–32)
Calcium: 8.3 mg/dL — ABNORMAL LOW (ref 8.9–10.3)
Creatinine, Ser: 0.85 mg/dL (ref 0.44–1.00)
GFR calc Af Amer: >60 mL/min (ref >60)
GFR calc non Af Amer: >60 mL/min (ref >60)
Glucose, Bld: 111 mg/dL — ABNORMAL HIGH (ref 65–99)
POTASSIUM: 3.9 mmol/L (ref 3.5–5.1)
SODIUM: 131 mmol/L — AB (ref 135–145)

## 2016-02-14 MED ORDER — SUMATRIPTAN SUCCINATE 25 MG PO TABS
25.0000 mg | ORAL_TABLET | ORAL | Status: DC | PRN
Start: 2016-02-14 — End: 2016-02-15
  Administered 2016-02-14: 25 mg via ORAL
  Filled 2016-02-14 (×5): qty 1

## 2016-02-14 NOTE — Plan of Care (Signed)
Problem: Activity: Goal: Risk for activity intolerance will decrease Outcome: Progressing Pt up to commode several times this shift with assistance. Pt encouraged to ambulate while awake with staff assistance in order to decrease skin break down. Pt verbalized understanding.

## 2016-02-14 NOTE — Progress Notes (Signed)
PROGRESS NOTE    BRUNELL PEASLEY  [id number] DOB: 11/20/1940 DOA: 02/12/2016 PCP: [name], MD    Brief Narrative:  Amala Lakatos Phillipsis a 75 y.o.femalewith history of hypertension, hyperlipidemia was admitted for atypical chest pain, migraine headaches,.  She was admitted for evaluation of ACS. Cardiology consultation requested, recommended echocardiogram. Echocardiogram is not significant.   Assessment & Plan:   Principal Problem:   Chest pain Active Problems:   Acute intractable headache   Hypertension  Atypical chest pain: Resolved.  ACS ruled out.  Echocardiogram shows good LVEF. Diastolic parameters are within normal limits. Cardiology signed off.    Hypertension: well controlled.    Migraine headaches: improved with sumatriptan.  She feels having an aura, but no headache at this time. CT head without contrast on 12/5 no acute finding. Monitor overnight and if no headaches, can be discharged in am with sumatriptan.    DVT prophylaxis: (scd's) Code Status: (Full) Family Communication: none at bedside.  Disposition Plan: home in am.    Consultants:  Cardiology.   Procedures: Echocardiogram.    Antimicrobials: none.    Subjective: Reports feeling an aura of migraine coming about.  Doesn't want to go home.   Objective: Vitals:   02/14/16 1025 02/14/16 1245 02/14/16 1642 02/14/16 1700  BP: 140/61 120/62 (!) 135/59   Pulse:  61  62  Resp:      Temp:  98.5 F (36.9 C)  98.6 F (37 C)  TempSrc:  Oral  Oral  SpO2: 100% 99%  99%  Weight:      Height:        Intake/Output Summary (Last 24 hours) at 02/14/16 1722 Last data filed at 02/14/16 1600  Gross per 24 hour  Intake           1097.5 ml  Output             2900 ml  Net          -1802.5 ml   Filed Weights   02/13/16 0400 02/14/16 0459  Weight: 92.1 kg (203 lb 1.6 oz) 92.6 kg (204 lb 1.6 oz)    Examination:  General exam: Appears anxious.  Respiratory system: Clear to  auscultation. Respiratory effort normal. Cardiovascular system: S1 & S2 heard, RRR. No JVD, murmurs, rubs, gallops or clicks. No pedal edema. Gastrointestinal system: Abdomen is nondistended, soft and nontender. No organomegaly or masses felt. Normal bowel sounds heard. Central nervous system: Alert and oriented. No focal neurological deficits. Extremities: Symmetric 5 x 5 power. Skin: No rashes, lesions or ulcers     Data Reviewed: I have personally reviewed following labs and imaging studies  CBC:  Recent Labs Lab 02/10/16 2350 02/12/16 1745  WBC 9.0 7.5  NEUTROABS 4.9  --   HGB 15.5* 14.7  HCT 43.9 41.7  MCV 82.5 80.0  PLT 275 Q000111Q   Basic Metabolic Panel:  Recent Labs Lab 02/10/16 2350 02/12/16 1745 02/13/16 1058 02/14/16 0343  NA 133* 127* 128* 131*  K 3.7 3.8 4.0 3.9  CL 96* 93* 96* 99*  CO2 23 22 24 25   GLUCOSE 120* 97 87 111*  BUN 18 13 11 12   CREATININE 1.06* 0.91 0.91 0.85  CALCIUM 9.6 9.6 8.4* 8.3*   GFR: Estimated Creatinine Clearance: 58.9 mL/min (by C-G formula based on SCr of 0.85 mg/dL). Liver Function Tests:  Recent Labs Lab 02/10/16 2350  AST 22  ALT 15  ALKPHOS 55  BILITOT 1.5*  PROT 6.7  ALBUMIN 4.1  No results for input(s): LIPASE, AMYLASE in the last 168 hours. No results for input(s): AMMONIA in the last 168 hours. Coagulation Profile:  Recent Labs Lab 02/10/16 2350 02/13/16 0033  INR 0.86 1.03   Cardiac Enzymes:  Recent Labs Lab 02/13/16 0503 02/13/16 1058 02/13/16 1541  TROPONINI <0.03 <0.03 <0.03   BNP (last 3 results) No results for input(s): PROBNP in the last 8760 hours. HbA1C: No results for input(s): HGBA1C in the last 72 hours. CBG: No results for input(s): GLUCAP in the last 168 hours. Lipid Profile: No results for input(s): CHOL, HDL, LDLCALC, TRIG, CHOLHDL, LDLDIRECT in the last 72 hours. Thyroid Function Tests:  Recent Labs  02/13/16 1541  TSH 2.569  FREET4 0.99   Anemia Panel: No results  for input(s): VITAMINB12, FOLATE, FERRITIN, TIBC, IRON, RETICCTPCT in the last 72 hours. Sepsis Labs: No results for input(s): PROCALCITON, LATICACIDVEN in the last 168 hours.  Recent Results (from the past 240 hour(s))  MRSA PCR Screening     Status: None   Collection Time: 02/13/16  5:09 AM  Result Value Ref Range Status   MRSA by PCR NEGATIVE NEGATIVE Final    Comment:        The GeneXpert MRSA Assay (FDA approved for NASAL specimens only), is one component of a comprehensive MRSA colonization surveillance program. It is not intended to diagnose MRSA infection nor to guide or monitor treatment for MRSA infections.          Radiology Studies: Dg Chest 2 View  Result Date: 02/12/2016 CLINICAL DATA:  Chest pain. EXAM: CHEST  2 VIEW COMPARISON:  Radiographs of April 05, 2012. FINDINGS: The heart size and mediastinal contours are within normal limits. Both lungs are clear. No pneumothorax or pleural effusion is noted. Mild dextroscoliosis of lower thoracic spine is noted. IMPRESSION: No active cardiopulmonary disease. Electronically Signed   By: Marijo Conception, M.D.   On: 02/12/2016 19:27   Ct Angio Chest Pe W And/or Wo Contrast  Result Date: 02/13/2016 CLINICAL DATA:  Chest pain and dyspnea. EXAM: CT ANGIOGRAPHY CHEST WITH CONTRAST TECHNIQUE: Multidetector CT imaging of the chest was performed using the standard protocol during bolus administration of intravenous contrast. Multiplanar CT image reconstructions and MIPs were obtained to evaluate the vascular anatomy. CONTRAST:  80 mL Isovue 370 intravenous COMPARISON:  09/24/2011 FINDINGS: Cardiovascular: Satisfactory opacification of the pulmonary arteries to the segmental level. No evidence of pulmonary embolism. Normal heart size. No pericardial effusion. Mediastinum/Nodes: No enlarged mediastinal, hilar, or axillary lymph nodes. Thyroid gland, trachea, and esophagus demonstrate no significant findings. Lungs/Pleura: Lungs are  clear. No pleural effusion or pneumothorax. Upper Abdomen: Moderate hiatal hernia. No acute findings are evident. Musculoskeletal: No significant skeletal lesion. Incidental vertebral hemangioma at T11. Review of the MIP images confirms the above findings. IMPRESSION: Negative for pulmonary embolism or other acute abnormality. Moderate hiatal hernia. Electronically Signed   By: Andreas Newport M.D.   On: 02/13/2016 02:02        Scheduled Meds: . carvedilol  6.25 mg Oral BID WC  . lisinopril  10 mg Oral QPM  . pravastatin  20 mg Oral QPM   Continuous Infusions:   LOS: 0 days    Time spent: 30 minutes.     Hosie Poisson, MD [facility] Pager [phone]  If 7PM-7AM, please contact night-coverage [email or web address] Password [email or web address] 02/14/2016, 5:22 PM

## 2016-02-14 NOTE — Progress Notes (Signed)
Pt stated she had an aura and can feel a migraine coming on. Md on call made aware. New order received for Imitrex. Will cont to monitor pt.

## 2016-02-14 NOTE — Plan of Care (Signed)
Problem: Skin Integrity: Goal: Risk for impaired skin integrity will decrease Outcome: Progressing Pt encouraged to turn q2h to encourage circulation and prevent skin breakdown. Pt verbalized understanding. Skin check performed and no signs of skin break down present. Will continue to monitor and perform hourly rounding. Call light within reach and pt encouraged to use before getting out of bed to prevent falls. Pt verbalized understanding.

## 2016-02-14 NOTE — Progress Notes (Signed)
Patient Name: [name] Date of Encounter: 02/14/2016  Primary Cardiologist: Dr. Aundra Dubin Dr. [facility] Problem List     Principal Problem:   Chest pain Active Problems:   Acute intractable headache   Hypertension     Subjective   Feeling well.  Without complaint.   Inpatient Medications    Scheduled Meds: . carvedilol  6.25 mg Oral BID WC  . lisinopril  10 mg Oral QPM  . pravastatin  20 mg Oral QPM   Continuous Infusions:  PRN Meds: acetaminophen, aspirin EC, calcium carbonate, hydrALAZINE, ondansetron (ZOFRAN) IV, oxyCODONE-acetaminophen   Vital Signs    Vitals:   02/14/16 0004 02/14/16 0459 02/14/16 0844 02/14/16 1025  BP: (!) 105/55 (!) 110/53 133/73 140/61  Pulse: (!) 57 78    Resp: 15 18    Temp: 97.7 F (36.5 C) 97.7 F (36.5 C) 98.6 F (37 C)   TempSrc: Oral Oral Oral   SpO2: 95% 100% 97% 100%  Weight:  92.6 kg (204 lb 1.6 oz)    Height:        Intake/Output Summary (Last 24 hours) at 02/14/16 1156 Last data filed at 02/14/16 0721  Gross per 24 hour  Intake          2133.75 ml  Output             1800 ml  Net           333.75 ml   Filed Weights   02/13/16 0400 02/14/16 0459  Weight: 92.1 kg (203 lb 1.6 oz) 92.6 kg (204 lb 1.6 oz)    Physical Exam   GEN: Well nourished, well developed, in no acute distress.  HEENT: Grossly normal.  Neck: Supple, no JVD, carotid bruits, or masses. Cardiac: RRR, no murmurs, rubs, or gallops. No clubbing, cyanosis, edema.  Radials/DP/PT 2+ and equal bilaterally.  Respiratory:  Respirations regular and unlabored, clear to auscultation bilaterally. GI: Soft, nontender, nondistended, BS + x 4. MS: no deformity or atrophy. Skin: warm and dry, no rash. Neuro:  Strength and sensation are intact. Psych: AAOx3.  Normal affect.  Labs    CBC  Recent Labs  02/12/16 1745  WBC 7.5  HGB 14.7  HCT 41.7  MCV 80.0  PLT [id number]   Basic Metabolic Panel  Recent Labs  02/13/16 1058 02/14/16 0343    NA 128* 131*  K 4.0 3.9  CL 96* 99*  CO2 24 25  GLUCOSE 87 111*  BUN 11 12  CREATININE 0.91 0.85  CALCIUM 8.4* 8.3*   Liver Function Tests No results for input(s): AST, ALT, ALKPHOS, BILITOT, PROT, ALBUMIN in the last 72 hours. No results for input(s): LIPASE, AMYLASE in the last 72 hours. Cardiac Enzymes  Recent Labs  02/13/16 0503 02/13/16 1058 02/13/16 1541  TROPONINI <0.03 <0.03 <0.03   BNP Invalid input(s): POCBNP D-Dimer No results for input(s): DDIMER in the last 72 hours. Hemoglobin A1C No results for input(s): HGBA1C in the last 72 hours. Fasting Lipid Panel No results for input(s): CHOL, HDL, LDLCALC, TRIG, CHOLHDL, LDLDIRECT in the last 72 hours. Thyroid Function Tests  Recent Labs  02/13/16 1541  TSH 2.569    Telemetry    Sinus rhythm, sinus bradycardia.  Rates 50s-70s - Personally Reviewed  ECG    n/a  Radiology    Dg Chest 2 View  Result Date: 02/12/2016 CLINICAL DATA:  Chest pain. EXAM: CHEST  2 VIEW COMPARISON:  Radiographs of April 05, 2012. FINDINGS: The heart  size and mediastinal contours are within normal limits. Both lungs are clear. No pneumothorax or pleural effusion is noted. Mild dextroscoliosis of lower thoracic spine is noted. IMPRESSION: No active cardiopulmonary disease. Electronically Signed   By: Marijo Conception, M.D.   On: 02/12/2016 19:27   Ct Angio Chest Pe W And/or Wo Contrast  Result Date: 02/13/2016 CLINICAL DATA:  Chest pain and dyspnea. EXAM: CT ANGIOGRAPHY CHEST WITH CONTRAST TECHNIQUE: Multidetector CT imaging of the chest was performed using the standard protocol during bolus administration of intravenous contrast. Multiplanar CT image reconstructions and MIPs were obtained to evaluate the vascular anatomy. CONTRAST:  80 mL Isovue 370 intravenous COMPARISON:  09/24/2011 FINDINGS: Cardiovascular: Satisfactory opacification of the pulmonary arteries to the segmental level. No evidence of pulmonary embolism. Normal heart  size. No pericardial effusion. Mediastinum/Nodes: No enlarged mediastinal, hilar, or axillary lymph nodes. Thyroid gland, trachea, and esophagus demonstrate no significant findings. Lungs/Pleura: Lungs are clear. No pleural effusion or pneumothorax. Upper Abdomen: Moderate hiatal hernia. No acute findings are evident. Musculoskeletal: No significant skeletal lesion. Incidental vertebral hemangioma at T11. Review of the MIP images confirms the above findings. IMPRESSION: Negative for pulmonary embolism or other acute abnormality. Moderate hiatal hernia. Electronically Signed   By: Andreas Newport M.D.   On: 02/13/2016 02:02    Cardiac Studies   Echo 02/13/16: Study Conclusions  - Left ventricle: The cavity size was normal. Wall thickness was   increased in a pattern of mild LVH. Systolic function was normal.   The estimated ejection fraction was in the range of 50% to 55%.   Wall motion was normal; there were no regional wall motion   abnormalities.   Patient Profile     [name] is a 80F with hypertension, prior DVT and GERD here with chest pain and a migraine.  EKG and cardiac enzymes are unremarkable.  Assessment & Plan    # Atypical chest pain: Symptoms have resolved and LVEF 50-55% with no wall motion abnormality on echo.  No ischemia evaluation at this time.  # Bradycardia: Improved after switching metoprolol to carvedilol.  # Hyperlipidemia: Continue pravastatin.  # Hypertension: BP controlled on carvedilol and lisinopril.   Signed, Skeet Latch, MD  02/14/2016, 11:56 AM

## 2016-02-15 DIAGNOSIS — R51 Headache: Secondary | ICD-10-CM

## 2016-02-15 MED ORDER — SUMATRIPTAN SUCCINATE 25 MG PO TABS: 25.0000 mg | ORAL_TABLET | ORAL | 0 refills | Status: DC | PRN

## 2016-02-15 MED ORDER — METOPROLOL TARTRATE 25 MG PO TABS: 25.0000 mg | ORAL_TABLET | Freq: Two times a day (BID) | ORAL | 0 refills | Status: DC

## 2016-02-15 MED ORDER — POLYETHYLENE GLYCOL 3350 17 G PO PACK: 17.0000 g | PACK | Freq: Every day | ORAL | Status: DC

## 2016-02-15 MED ORDER — SENNA 8.6 MG PO TABS: 2.0000 | ORAL_TABLET | Freq: Every day | ORAL | Status: DC | PRN

## 2016-02-15 MED ORDER — POLYETHYLENE GLYCOL 3350 17 G PO PACK: 17.0000 g | PACK | Freq: Every day | ORAL | 0 refills | Status: DC

## 2016-02-15 NOTE — Discharge Summary (Signed)
Discharge Summary  [name] [id number] DOB: 1940/07/05  PCP: [name], MD  Admit date: 02/12/2016 Discharge date: 02/15/2016   Recommendations for Outpatient Follow-up:  1. PCP 2 weeks   Discharge Diagnoses:  Active Hospital Problems   Diagnosis Date Noted  . Chest pain 02/13/2016  . Hypertension 05/13/2011  . Acute intractable headache 07/01/2007    Resolved Hospital Problems   Diagnosis Date Noted Date Resolved  No resolved problems to display.    Discharge Condition: Stable   Diet recommendation: Cardiac  Vitals:   02/15/16 0622 02/15/16 0821  BP: 140/73 (!) 127/92  Pulse: 64 80  Resp:    Temp: 97.8 F (36.6 C) 98.4 F (36.9 C)    HPI and Brief Hospital Course:  [name] is a 75 y.o. female with history of hypertension, hyperlipidemia was advised to come to the ER after patient called her cardiology office. Patient is mildly confused after coming to the ER after patient started developing migraine headache. Patient states she had some chest pain yesterday but at this time is not able to clearly say how long it lasted and the character of the pain. Patient points to the left anterior chest wall. Since patient has had previous history of DVT CT angiogram of the chest was done which was negative for PE. Troponins were negative EKG was showing nonspecific findings.  While in the ER patient started developing headache typical of her migraine. Patient's headache is mostly in the left side of the head with photophobia and nausea and vomiting. Patient has chronic migraines and has not had any attacks last 2 years but over the last 2 weeks this is her third attack. Last visit to the ER patient had CT head 2 days ago which was unremarkable and patient's headache improved with Toradol and Benadryl. Patient appears nonfocal otherwise.    Discharge details, plan of care and follow up instructions were discussed with patient and any available family or  care providers. Patient and family are in agreement with discharge from the hospital today and all questions were answered to their satisfaction.  Details of Hospital Course:  Atypical chest pain: Resolved.  ACS ruled out.  Echocardiogram shows good LVEF. Diastolic parameters are within normal limits. Cardiology signed off.  Metoprolol changed to coreg due to bradycardia. Patient felt she does not tolerate, so changed back to metoprolol but lower dose this AM.  Hypertension: well controlled.   Migraine headaches: improved with sumatriptan, she will be sent home with this today. CT head without contrast on 12/5 no acute finding.  She was not sent home yesterday because she felt she might have a migraine. She never did.  Procedures:  Echo  CT head    Consultations:  cardiology   Discharge Exam: BP (!) 127/92 (BP Location: Left Arm)   Pulse 80   Temp 98.4 F (36.9 C) (Oral)   Resp 18   Ht 5' (1.524 m)   Wt 89.9 kg (198 lb 4.8 oz)   SpO2 100%   BMI 38.73 kg/m  General:  Alert, oriented, calm, in no acute distress  Eyes: EOMI, clear sclerea Neck: supple, no masses, trachea mildline  Cardiovascular: RRR, no murmurs or rubs, no peripheral edema  Respiratory: clear to auscultation bilaterally, no wheezes, no crackles  Abdomen: soft, nontender, nondistended, normal bowel tones heard  Skin: dry, no rashes  Musculoskeletal: no joint effusions, normal range of motion  Psychiatric: appropriate affect, normal speech  Neurologic: extraocular muscles intact, clear speech,  moving all extremities with intact sensorium    Discharge Instructions You were cared for by a hospitalist during your hospital stay. If you have any questions about your discharge medications or the care you received while you were in the hospital after you are discharged, you can call the unit and asked to speak with the hospitalist on call if the hospitalist that took care of you is not available. Once you are  discharged, your primary care physician will handle any further medical issues. Please note that NO REFILLS for any discharge medications will be authorized once you are discharged, as it is imperative that you return to your primary care physician (or establish a relationship with a primary care physician if you do not have one) for your aftercare needs so that they can reassess your need for medications and monitor your lab values.  Discharge Instructions    Diet - low sodium heart healthy    Complete by:  As directed    Increase activity slowly    Complete by:  As directed        Medication List    TAKE these medications   aspirin EC 81 MG tablet Take 81 mg by mouth every 6 (six) hours as needed for moderate pain.   calcium carbonate 750 MG chewable tablet Commonly known as:  TUMS EX Chew 2 tablets by mouth daily as needed for heartburn.   hydrochlorothiazide 25 MG tablet Commonly known as:  HYDRODIURIL Take 1 tablet (25 mg total) by mouth daily.   lisinopril 10 MG tablet Commonly known as:  PRINIVIL,ZESTRIL Take 1 tablet (10 mg total) by mouth every evening.   metoCLOPramide 10 MG tablet Commonly known as:  REGLAN Take 5-10 mg by mouth every 8 (eight) hours as needed (for migraine). For migraine headache   metoprolol tartrate 25 MG tablet Commonly known as:  LOPRESSOR Take 1 tablet (25 mg total) by mouth 2 (two) times daily. Take (1) one tablet by mouth twice daily. What changed:  medication strength  how much to take  how to take this  when to take this   oxyCODONE-acetaminophen 5-325 MG tablet Commonly known as:  PERCOCET/ROXICET Take 1 tablet by mouth every 4 (four) hours as needed for severe pain.   polyethylene glycol packet Commonly known as:  MIRALAX / GLYCOLAX Take 17 g by mouth daily.   pravastatin 20 MG tablet Commonly known as:  PRAVACHOL Take 1 tablet (20 mg total) by mouth every evening.   SUMAtriptan 25 MG tablet Commonly known as:   IMITREX Take 1 tablet (25 mg total) by mouth every 2 (two) hours as needed for migraine or headache. May repeat in 2 hours if headache persists or recurs.   VITAMIN B-12 IJ Inject as directed every 30 (thirty) days.      Allergies  Allergen Reactions  . Reglan [Metoclopramide] Other (See Comments)    Speech is garbled, extreme trouble making words   Follow-up Information    [name], MD On 02/27/2016.   Specialty:  Family Medicine Why:  Hospital follow up arrive @ 1130 am Contact information: [place] [address] [phone]        Loralie Champagne, MD.   Specialty:  Cardiology Contact information: [id number] [place] [address] [phone]            The results of significant diagnostics from this hospitalization (including imaging, microbiology, ancillary and laboratory) are listed below for reference.  Significant Diagnostic Studies: Dg Chest 2 View  Result Date: 02/12/2016 CLINICAL DATA:  Chest pain. EXAM: CHEST  2 VIEW COMPARISON:  Radiographs of April 05, 2012. FINDINGS: The heart size and mediastinal contours are within normal limits. Both lungs are clear. No pneumothorax or pleural effusion is noted. Mild dextroscoliosis of lower thoracic spine is noted. IMPRESSION: No active cardiopulmonary disease. Electronically Signed   By: Marijo Conception, M.D.   On: 02/12/2016 19:27   Ct Head Wo Contrast  Result Date: 02/11/2016 CLINICAL DATA:  Symptoms of cerebrovascular accident. Initial encounter. EXAM: CT HEAD WITHOUT CONTRAST TECHNIQUE: Contiguous axial images were obtained from the base of the skull through the vertex without intravenous contrast. COMPARISON:  CT of the head and MRI of the brain performed 01/09/2016 FINDINGS: Brain: No evidence of acute infarction, hemorrhage, hydrocephalus, extra-axial collection or mass lesion/mass effect. Mild periventricular and subcortical white matter change may reflect  small vessel ischemic microangiopathy. The posterior fossa, including the cerebellum, brainstem and fourth ventricle, is within normal limits. The third and lateral ventricles, and basal ganglia are unremarkable in appearance. The cerebral hemispheres are symmetric in appearance, with normal gray-white differentiation. No mass effect or midline shift is seen. Vascular: No hyperdense vessel or unexpected calcification. Skull: There is no evidence of fracture; visualized osseous structures are unremarkable in appearance. Sinuses/Orbits: The visualized portions of the orbits are within normal limits. The paranasal sinuses and mastoid air cells are well-aerated. Other: No significant soft tissue abnormalities are seen. IMPRESSION: 1. No acute intracranial pathology seen on CT. 2. Mild small vessel ischemic microangiopathy. Electronically Signed   By: Garald Balding M.D.   On: 02/11/2016 00:37   Ct Angio Chest Pe W And/or Wo Contrast  Result Date: 02/13/2016 CLINICAL DATA:  Chest pain and dyspnea. EXAM: CT ANGIOGRAPHY CHEST WITH CONTRAST TECHNIQUE: Multidetector CT imaging of the chest was performed using the standard protocol during bolus administration of intravenous contrast. Multiplanar CT image reconstructions and MIPs were obtained to evaluate the vascular anatomy. CONTRAST:  80 mL Isovue 370 intravenous COMPARISON:  09/24/2011 FINDINGS: Cardiovascular: Satisfactory opacification of the pulmonary arteries to the segmental level. No evidence of pulmonary embolism. Normal heart size. No pericardial effusion. Mediastinum/Nodes: No enlarged mediastinal, hilar, or axillary lymph nodes. Thyroid gland, trachea, and esophagus demonstrate no significant findings. Lungs/Pleura: Lungs are clear. No pleural effusion or pneumothorax. Upper Abdomen: Moderate hiatal hernia. No acute findings are evident. Musculoskeletal: No significant skeletal lesion. Incidental vertebral hemangioma at T11. Review of the MIP images confirms  the above findings. IMPRESSION: Negative for pulmonary embolism or other acute abnormality. Moderate hiatal hernia. Electronically Signed   By: Andreas Newport M.D.   On: 02/13/2016 02:02    Microbiology: Recent Results (from the past 240 hour(s))  MRSA PCR Screening     Status: None   Collection Time: 02/13/16  5:09 AM  Result Value Ref Range Status   MRSA by PCR NEGATIVE NEGATIVE Final    Comment:        The GeneXpert MRSA Assay (FDA approved for NASAL specimens only), is one component of a comprehensive MRSA colonization surveillance program. It is not intended to diagnose MRSA infection nor to guide or monitor treatment for MRSA infections.      Labs: Basic Metabolic Panel:  Recent Labs Lab 02/10/16 2350 02/12/16 1745 02/13/16 1058 02/14/16 0343  NA 133* 127* 128* 131*  K 3.7 3.8 4.0 3.9  CL 96* 93* 96* 99*  CO2 23 22 24 25   GLUCOSE 120* 97  87 111*  BUN 18 13 11 12   CREATININE 1.06* 0.91 0.91 0.85  CALCIUM 9.6 9.6 8.4* 8.3*   Liver Function Tests:  Recent Labs Lab 02/10/16 2350  AST 22  ALT 15  ALKPHOS 55  BILITOT 1.5*  PROT 6.7  ALBUMIN 4.1   No results for input(s): LIPASE, AMYLASE in the last 168 hours. No results for input(s): AMMONIA in the last 168 hours. CBC:  Recent Labs Lab 02/10/16 2350 02/12/16 1745  WBC 9.0 7.5  NEUTROABS 4.9  --   HGB 15.5* 14.7  HCT 43.9 41.7  MCV 82.5 80.0  PLT 275 259   Cardiac Enzymes:  Recent Labs Lab 02/13/16 0503 02/13/16 1058 02/13/16 1541  TROPONINI <0.03 <0.03 <0.03   BNP: BNP (last 3 results)  Recent Labs  02/12/16 1745  BNP 127.4*    ProBNP (last 3 results) No results for input(s): PROBNP in the last 8760 hours.  CBG: No results for input(s): GLUCAP in the last 168 hours.  Time spent: 21 minutes were spent in preparing this discharge including medication reconciliation, counseling, and coordination of care.  Signed:  Mir Marry Guan, MD  [facility] 02/15/2016, 11:01 AM

## 2016-02-15 NOTE — Discharge Instructions (Signed)
Nonspecific Chest Pain °Chest pain can be caused by many different conditions. There is always a chance that your pain could be related to something serious, such as a heart attack or a blood clot in your lungs. Chest pain can also be caused by conditions that are not life-threatening. If you have chest pain, it is very important to follow up with your health care provider. °What are the causes? °Causes of this condition include: °· Heartburn. °· Pneumonia or bronchitis. °· Anxiety or stress. °· Inflammation around your heart (pericarditis) or lung (pleuritis or pleurisy). °· A blood clot in your lung. °· A collapsed lung (pneumothorax). This can develop suddenly on its own (spontaneous pneumothorax) or from trauma to the chest. °· Shingles infection (varicella-zoster virus). °· Heart attack. °· Damage to the bones, muscles, and cartilage that make up your chest wall. This can include: °? Bruised bones due to injury. °? Strained muscles or cartilage due to frequent or repeated coughing or overwork. °? Fracture to one or more ribs. °? Sore cartilage due to inflammation (costochondritis). ° °What increases the risk? °Risk factors for this condition may include: °· Activities that increase your risk for trauma or injury to your chest. °· Respiratory infections or conditions that cause frequent coughing. °· Medical conditions or overeating that can cause heartburn. °· Heart disease or family history of heart disease. °· Conditions or health behaviors that increase your risk of developing a blood clot. °· Having had chicken pox (varicella zoster). ° °What are the signs or symptoms? °Chest pain can feel like: °· Burning or tingling on the surface of your chest or deep in your chest. °· Crushing, pressure, aching, or squeezing pain. °· Dull or sharp pain that is worse when you move, cough, or take a deep breath. °· Pain that is also felt in your back, neck, shoulder, or arm, or pain that spreads to any of these  areas. ° °Your chest pain may come and go, or it may stay constant. °How is this diagnosed? °Lab tests or other studies may be needed to find the cause of your pain. Your health care provider may have you take a test called an ECG (electrocardiogram). An ECG records your heartbeat patterns at the time the test is performed. You may also have other tests, such as: °· Transthoracic echocardiogram (TTE). In this test, sound waves are used to create a picture of the heart structures and to look at how blood flows through your heart. °· Transesophageal echocardiogram (TEE). This is a more advanced imaging test that takes images from inside your body. It allows your health care provider to see your heart in finer detail. °· Cardiac monitoring. This allows your health care provider to monitor your heart rate and rhythm in real time. °· Holter monitor. This is a portable device that records your heartbeat and can help to diagnose abnormal heartbeats. It allows your health care provider to track your heart activity for several days, if needed. °· Stress tests. These can be done through exercise or by taking medicine that makes your heart beat more quickly. °· Blood tests. °· Other imaging tests. ° °How is this treated? °Treatment depends on what is causing your chest pain. Treatment may include: °· Medicines. These may include: °? Acid blockers for heartburn. °? Anti-inflammatory medicine. °? Pain medicine for inflammatory conditions. °? Antibiotic medicine, if an infection is present. °? Medicines to dissolve blood clots. °? Medicines to treat coronary artery disease (CAD). °· Supportive care for conditions that   do not require medicines. This may include: °? Resting. °? Applying heat or cold packs to injured areas. °? Limiting activities until pain decreases. ° °Follow these instructions at home: °Medicines °· If you were prescribed an antibiotic, take it as told by your health care provider. Do not stop taking the  antibiotic even if you start to feel better. °· Take over-the-counter and prescription medicines only as told by your health care provider. °Lifestyle °· Do not use any products that contain nicotine or tobacco, such as cigarettes and e-cigarettes. If you need help quitting, ask your health care provider. °· Do not drink alcohol. °· Make lifestyle changes as directed by your health care provider. These may include: °? Getting regular exercise. Ask your health care provider to suggest some activities that are safe for you. °? Eating a heart-healthy diet. A registered dietitian can help you to learn healthy eating options. °? Maintaining a healthy weight. °? Managing diabetes, if necessary. °? Reducing stress, such as with yoga or relaxation techniques. °General instructions °· Avoid any activities that bring on chest pain. °· If heartburn is the cause for your chest pain, raise (elevate) the head of your bed about 6 inches (15 cm) by putting blocks under the legs. Sleeping with more pillows does not effectively relieve heartburn because it only changes the position of your head. °· Keep all follow-up visits as told by your health care provider. This is important. This includes any further testing if your chest pain does not go away. °Contact a health care provider if: °· Your chest pain does not go away. °· You have a rash with blisters on your chest. °· You have a fever. °· You have chills. °Get help right away if: °· Your chest pain is worse. °· You have a cough that gets worse, or you cough up blood. °· You have severe pain in your abdomen. °· You have severe weakness. °· You faint. °· You have sudden, unexplained chest discomfort. °· You have sudden, unexplained discomfort in your arms, back, neck, or jaw. °· You have shortness of breath at any time. °· You suddenly start to sweat, or your skin gets clammy. °· You feel nauseous or you vomit. °· You suddenly feel light-headed or dizzy. °· Your heart begins to beat  quickly, or it feels like it is skipping beats. °These symptoms may represent a serious problem that is an emergency. Do not wait to see if the symptoms will go away. Get medical help right away. Call your local emergency services (911 in the [place] Do not drive yourself to the hospital. °This information is not intended to replace advice given to you by your health care provider. Make sure you discuss any questions you have with your health care provider. °Document Released: 12/04/2004 Document Revised: 11/19/2015 Document Reviewed: 11/19/2015 °Elsevier Interactive Patient Education © 2017 [employer] ° °

## 2016-02-16 LAB — METANEPHRINES, URINE, 24 HOUR
Metaneph Total, Ur: 371 mcg/24 h (ref 224–832)
Metanephrines, Ur: 72 mcg/24 h — ABNORMAL LOW (ref 90–315)
Normetanephrine, 24H Ur: 299 mcg/24 h (ref 122–676)

## 2016-02-16 LAB — CATECHOLAMINES, FRACTIONATED, URINE, 24 HOUR
CALCULATED TOTAL (E+ NE): 23 ug/(24.h) — AB (ref 26–121)
CREATININE, URINE MG/DAY-CATEUR: 0.94 g/(24.h) (ref 0.63–2.50)
DOPAMINE, 24 HR URINE: 77 ug/(24.h) (ref 52–480)
NOREPINEPHRINE, 24 HR UR: 23 ug/(24.h) (ref 15–100)
Total Volume - CF 24Hr U: 1300 mL

## 2016-02-18 ENCOUNTER — Telehealth: Payer: Self-pay | Admitting: *Deleted

## 2016-02-18 NOTE — Telephone Encounter (Signed)
Tried x 2 to reach pt, no answer second time. These results have also been released into MY CHART for the pt.

## 2016-02-19 ENCOUNTER — Encounter: Payer: Self-pay | Admitting: Internal Medicine

## 2016-02-20 ENCOUNTER — Ambulatory Visit: Payer: Medicare Other | Admitting: Neurology

## 2016-02-25 ENCOUNTER — Ambulatory Visit (INDEPENDENT_AMBULATORY_CARE_PROVIDER_SITE_OTHER): Payer: Medicare Other | Admitting: Neurology

## 2016-02-25 ENCOUNTER — Encounter: Payer: Self-pay | Admitting: Neurology

## 2016-02-25 DIAGNOSIS — G43119 Migraine with aura, intractable, without status migrainosus: Secondary | ICD-10-CM | POA: Insufficient documentation

## 2016-02-25 HISTORY — DX: Migraine with aura, intractable, without status migrainosus: G43.119

## 2016-02-25 NOTE — Progress Notes (Signed)
Reason for visit: Migraine headache  Referring physician: Dr. Gerda Diss is a 75 y.o. female  History of present illness:  Ms. Moeder is a 75 year old white female with a history of migraine headaches since around age 34. The patient has had headaches off and on throughout her life but over the last 2 years or so her headaches have essentially disappeared until just recently. Within the last month, she has had about 4 or 5 headaches, she required admission to the hospital around 02/12/2016 with a severe headache. The headache will generally start with a visual distortion with a half-moon shape in her right visual field that will be associated with fortification spectra, and blue flashing lights. The visual disturbance may last 15-20 minutes and then improve and then a left-sided headache may begin associated with nausea and dry heaves. The patient has noted that if she can get to sleep this may help. She does have photophobia and phonophobia with the headache. Sometimes odors make worsen the headache. The patient has been followed through the [facility], she has been placed on Reglan which was not very effective for the headache. The patient may have some speech disturbance when the headache ensues, she may have some occasional numbness around the mouth and with the hands associated with the nausea. The patient was given a prescription of Imitrex 25 mg tabs to take for the headache, she has not had any headaches since being in the hospital. MRI of the brain during that hospitalization did not show any acute changes.  Past Medical History:  Diagnosis Date  . Arthritis    "got alot; especially in my spine"  . Chest pain 02/2016  . Chronic back pain    "all of it"  . Diverticulosis of colon (without mention of hemorrhage)   . DVT of leg (deep venous thrombosis) ([facility]) 09/2011   LLE  . Dysrhythmia    notes in epic  . Esophageal reflux   . Family history of  malignant neoplasm of gastrointestinal tract   . Fibromyalgia    "dx'd w/it when it first came out"  . Hiatal hernia   . Hyperlipidemia   . Hypertension   . Migraines 09/24/11   "often recently;  more controlled now"  . Obesity, unspecified   . Osteoporosis   . Palpitations    event monitor 8/13: NSR, Sinus brady, Sinus Tachy, PAC  . Personal history of colonic polyps 05/31/2007   adenomatous polyp  . Pneumonia    "3 times at least" (09/24/11)  . Rheumatoid arthritis(714.0)   . Scoliosis   . Stricture and stenosis of esophagus   . Unspecified gastritis and gastroduodenitis without mention of hemorrhage     Past Surgical History:  Procedure Laterality Date  . BACK SURGERY    . BREAST CYST INCISION AND DRAINAGE  2012   left; "golf-ball sized"  . BREAST SURGERY     cyst lanced and drainded  . COLON SURGERY    . COLONOSCOPY  2009  . ESOPHAGEAL DILATION     "several times"  . EXPLORATORY LAPAROTOMY  04/2000   "for ruptured colon"  . HEMATOMA EVACUATION  09/2011   LLE  . LUMBAR LAMINECTOMY/DECOMPRESSION MICRODISCECTOMY  09/03/2011   Procedure: LUMBAR LAMINECTOMY/DECOMPRESSION MICRODISCECTOMY 2 LEVELS;  Surgeon: Elaina Hoops, MD;  Location: [facility];  Service: Neurosurgery;  Laterality: Left;  Left Lumbar Three-Four, Lumbar Four-Five Decompressive Laminectomy  . SALIVARY GLAND SURGERY  ~ 1980   1  removed  . SHOULDER SURGERY  2008   right shoulder and collar bone  . SIGMOID RESECTION / RECTOPEXY  09/1999   Dr Rosana Hoes  . TRIGGER FINGER RELEASE  2004   left thumb  . TUMOR REMOVAL  1960's   left leg  . UPPER GASTROINTESTINAL ENDOSCOPY      Family History  Problem Relation Age of Onset  . Stroke Mother   . Hypertension Mother   . Stroke Father   . Hypertension Father   . Diabetes Father   . Pancreatic cancer Sister     72 at death  . Diabetes Sister     x 2  . Colon cancer Sister 6  . Cancer Sister   . Heart attack Neg Hx     Social history:  reports that she has  never smoked. She has never used smokeless tobacco. She reports that she does not drink alcohol or use drugs.  Medications:  Prior to Admission medications   Medication Sig Start Date End Date Taking? Authorizing Provider  aspirin EC 81 MG tablet Take 81 mg by mouth every 6 (six) hours as needed for moderate pain.    Yes Historical Provider, MD  calcium carbonate (TUMS EX) 750 MG chewable tablet Chew 2 tablets by mouth daily as needed for heartburn.    Yes Historical Provider, MD  Cyanocobalamin (VITAMIN B-12 IJ) Inject as directed every 30 (thirty) days.   Yes Historical Provider, MD  hydrochlorothiazide (HYDRODIURIL) 25 MG tablet Take 1 tablet (25 mg total) by mouth daily. 03/23/15  Yes Scott Joylene Draft, PA-C  lisinopril (PRINIVIL,ZESTRIL) 10 MG tablet Take 1 tablet (10 mg total) by mouth every evening. 01/21/16 04/20/16 Yes Scott T Weaver, PA-C  metoprolol (LOPRESSOR) 25 MG tablet Take 1 tablet (25 mg total) by mouth 2 (two) times daily. Take (1) one tablet by mouth twice daily. 02/15/16 03/16/16 Yes Mir Marry Guan, MD  oxyCODONE-acetaminophen (PERCOCET/ROXICET) 5-325 MG tablet Take 1 tablet by mouth every 4 (four) hours as needed for severe pain.   Yes Historical Provider, MD  polyethylene glycol (MIRALAX / GLYCOLAX) packet Take 17 g by mouth daily. 02/15/16  Yes Mir Marry Guan, MD  pravastatin (PRAVACHOL) 20 MG tablet Take 1 tablet (20 mg total) by mouth every evening. 01/21/16 04/20/16 Yes Scott Joylene Draft, PA-C  SUMAtriptan (IMITREX) 25 MG tablet Take 1 tablet (25 mg total) by mouth every 2 (two) hours as needed for migraine or headache. May repeat in 2 hours if headache persists or recurs. 02/15/16  Yes Mir Marry Guan, MD      Allergies  Allergen Reactions  . Reglan [Metoclopramide] Other (See Comments)    Speech is garbled, extreme trouble making words    ROS:  Out of a complete 14 system review of symptoms, the patient complains only of the following symptoms, and  all other reviewed systems are negative.  Fatigue Blurred vision Wheezing Joint pain, joint swelling, muscle cramps Migraine headache  Blood pressure (!) 142/76, pulse 76, height 5' (1.524 m), weight 197 lb (89.4 kg).  Physical Exam  General: The patient is alert and cooperative at the time of the examination. The patient is markedly obese.  Eyes: Pupils are equal, round, and reactive to light. Discs are flat bilaterally.  Neck: The neck is supple, no carotid bruits are noted.  Respiratory: The respiratory examination is clear.  Cardiovascular: The cardiovascular examination reveals a regular rate and rhythm, no obvious murmurs or rubs are noted.  Skin: Extremities are without significant  edema.  Neurologic Exam  Mental status: The patient is alert and oriented x 3 at the time of the examination. The patient has apparent normal recent and remote memory, with an apparently normal attention span and concentration ability.  Cranial nerves: Facial symmetry is present. There is good sensation of the face to pinprick and soft touch bilaterally. The strength of the facial muscles and the muscles to head turning and shoulder shrug are normal bilaterally. Speech is well enunciated, no aphasia or dysarthria is noted. Extraocular movements are full. Visual fields are full. The tongue is midline, and the patient has symmetric elevation of the soft palate. No obvious hearing deficits are noted.  Motor: The motor testing reveals 5 over 5 strength of all 4 extremities. Good symmetric motor tone is noted throughout.  Sensory: Sensory testing is intact to pinprick, soft touch, vibration sensation, and position sense on all 4 extremities. No evidence of extinction is noted.  Coordination: Cerebellar testing reveals good finger-nose-finger and heel-to-shin bilaterally.  Gait and station: Gait is normal. Tandem gait is slightly unsteady. Romberg is negative. No drift is seen.  Reflexes: Deep tendon  reflexes are symmetric and normal bilaterally. Toes are downgoing bilaterally.   Assessment/Plan:  1. Classic migraine  The patient is having migraine headaches that have increased in frequency over the last month. The patient does not wish to go on a daily medication this time, but if the headaches continue we will start Topamax. The patient has low-dose Imitrex to take if needed, she will call our office if this is not effective. Otherwise, she will follow-up in 3 months.  Jill Alexanders MD 02/25/2016 2:33 PM  [facility] [address] [place], [place] [address]  Phone [phone] Fax [phone]

## 2016-02-27 DIAGNOSIS — E538 Deficiency of other specified B group vitamins: Secondary | ICD-10-CM | POA: Diagnosis not present

## 2016-02-27 DIAGNOSIS — G43909 Migraine, unspecified, not intractable, without status migrainosus: Secondary | ICD-10-CM | POA: Diagnosis not present

## 2016-02-28 ENCOUNTER — Encounter: Payer: Self-pay | Admitting: *Deleted

## 2016-02-29 DIAGNOSIS — J019 Acute sinusitis, unspecified: Secondary | ICD-10-CM | POA: Diagnosis not present

## 2016-02-29 DIAGNOSIS — J209 Acute bronchitis, unspecified: Secondary | ICD-10-CM | POA: Diagnosis not present

## 2016-03-12 ENCOUNTER — Telehealth: Payer: Self-pay | Admitting: Internal Medicine

## 2016-03-12 NOTE — Telephone Encounter (Signed)
[place], [place]   03/12/2016 10:27 AM

## 2016-03-12 NOTE — Telephone Encounter (Signed)
Patient advised that her echo appt that is scheduled for 03/14/16 will be cx since she did this recently at [facility] on 02/13/16. Patient advise that these results will be available for review by her provider.

## 2016-03-12 NOTE — Telephone Encounter (Signed)
[name] is calling because she was admitted in the hospital on 02-13-16 and they hospital completed an echocardiogram while she was there, so she is calling to see if she could cancel the echo scheduled for 03-13-16. Please call, if no answer please leave message. Thanks.

## 2016-03-14 ENCOUNTER — Other Ambulatory Visit (HOSPITAL_COMMUNITY): Payer: Medicare Other

## 2016-03-17 DIAGNOSIS — Z01419 Encounter for gynecological examination (general) (routine) without abnormal findings: Secondary | ICD-10-CM | POA: Diagnosis not present

## 2016-03-21 ENCOUNTER — Encounter: Payer: Self-pay | Admitting: Internal Medicine

## 2016-03-21 ENCOUNTER — Ambulatory Visit (INDEPENDENT_AMBULATORY_CARE_PROVIDER_SITE_OTHER): Payer: Medicare Other | Admitting: Internal Medicine

## 2016-03-21 VITALS — BP 116/80 | HR 64 | Ht 60.0 in | Wt 196.6 lb

## 2016-03-21 DIAGNOSIS — I10 Essential (primary) hypertension: Secondary | ICD-10-CM | POA: Diagnosis not present

## 2016-03-21 DIAGNOSIS — G909 Disorder of the autonomic nervous system, unspecified: Secondary | ICD-10-CM | POA: Diagnosis not present

## 2016-03-21 DIAGNOSIS — R0789 Other chest pain: Secondary | ICD-10-CM

## 2016-03-21 DIAGNOSIS — E785 Hyperlipidemia, unspecified: Secondary | ICD-10-CM

## 2016-03-21 NOTE — Patient Instructions (Signed)
Medication Instructions:  Your physician recommends that you continue on your current medications as directed. Please refer to the Current Medication list given to you today.   Labwork: None   Testing/Procedures: None   Follow-Up: Your physician wants you to follow-up in: 1 year with Dr End. (January 2019).  You will receive a reminder letter in the mail two months in advance. If you don't receive a letter, please call our office to schedule the follow-up appointment.        If you need a refill on your cardiac medications before your next appointment, please call your pharmacy.   
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
## 2016-03-31 DIAGNOSIS — E538 Deficiency of other specified B group vitamins: Secondary | ICD-10-CM | POA: Diagnosis not present

## 2016-04-07 DIAGNOSIS — Z1231 Encounter for screening mammogram for malignant neoplasm of breast: Secondary | ICD-10-CM | POA: Diagnosis not present

## 2016-04-21 DIAGNOSIS — J302 Other seasonal allergic rhinitis: Secondary | ICD-10-CM | POA: Diagnosis not present

## 2016-04-21 DIAGNOSIS — H6590 Unspecified nonsuppurative otitis media, unspecified ear: Secondary | ICD-10-CM | POA: Diagnosis not present

## 2016-04-24 ENCOUNTER — Encounter: Payer: Self-pay | Admitting: Internal Medicine

## 2016-04-24 ENCOUNTER — Ambulatory Visit (INDEPENDENT_AMBULATORY_CARE_PROVIDER_SITE_OTHER): Payer: Medicare Other | Admitting: Internal Medicine

## 2016-04-24 VITALS — BP 142/72 | HR 68 | Ht 59.0 in | Wt 199.8 lb

## 2016-04-24 DIAGNOSIS — R1319 Other dysphagia: Secondary | ICD-10-CM

## 2016-04-24 DIAGNOSIS — K219 Gastro-esophageal reflux disease without esophagitis: Secondary | ICD-10-CM

## 2016-04-24 DIAGNOSIS — Z800 Family history of malignant neoplasm of digestive organs: Secondary | ICD-10-CM

## 2016-04-24 DIAGNOSIS — Z86010 Personal history of colonic polyps: Secondary | ICD-10-CM

## 2016-04-24 DIAGNOSIS — R1310 Dysphagia, unspecified: Secondary | ICD-10-CM

## 2016-04-24 MED ORDER — PANTOPRAZOLE SODIUM 40 MG PO TBEC: 40.0000 mg | DELAYED_RELEASE_TABLET | Freq: Two times a day (BID) | ORAL | 3 refills | Status: DC

## 2016-04-24 NOTE — Patient Instructions (Signed)
   We have sent the following medications to your pharmacy for you to pick up at your convenience: Pantoprazole   Please follow up with Dr Carlean Purl in 2 months.    I appreciate the opportunity to care for you.  Silvano Rusk, MD, [facility]

## 2016-04-24 NOTE — Progress Notes (Signed)
   [name] 76 y.o. May 12, 1940 [id number]  Assessment & Plan:   Encounter Diagnoses  Name Primary?  . Gastroesophageal reflux disease, esophagitis presence not specified Yes  . Esophageal dysphagia   . History of colonic polyps   . Family history of colon cancer     Restart PPI. We checked her formulary, and it looks like pantoprazole is very cost-effective for her. 40 mg twice a day as prescribed 1 year prescription. She will return in 2 months, and decide if EGD and possible dilation is necessary. She has responded to PPI in the past, and these are similar chronic symptoms and she's not sure she wants a dilation yet. She needs a surveillance colonoscopy for polyps as well and will make that decision whether or not she gets that alone or an EGD and a colonoscopy when she returns for follow-up.  I appreciate the opportunity to care for this patient. CC: Megan Nip, MD   Subjective:   Chief Complaint: Reflux  HPI  Patient is a very nice elderly woman recently turned 63 previously followed by Dr. Verl Blalock, with GERD history of colon polyps and a family history of colon cancer in a sister. She's had some dysphagia which has responded to PPI and dilation. When she was last seen in 2015 she felt like she was okay with rare dysphagia and modifying her diet and she was on Nexium. Subsequently that is off her formulary and is on affordable. She is using some over-the-counter intermittently and some antacids. She is having heartburn symptoms again, and having some dysphagia to solid foods. Nothing different than in the past and no unintentional weight loss. He brought her formulary information with her and we reviewed that and chose a PPI.  Medications, allergies, past medical history, past surgical history, family history and social history are reviewed and updated in the EMR.   Wt Readings from Last 3 Encounters:  04/24/16 199 lb 12.8 oz (90.6 kg)  03/21/16 196 lb  9.6 oz (89.2 kg)  02/25/16 197 lb (89.4 kg)     Review of Systems Recent problems with headaches atypical chest pain and hypotension, new migraine medication is helping metoprolol dose was adjusted. She was briefly hospitalized.  Objective:   Physical Exam BP (!) 142/72   Pulse 68   Ht 4\' 11"  (1.499 m)   Wt 199 lb 12.8 oz (90.6 kg)   BMI 40.35 kg/m  No acute distress, pleasant elderly obese woman  I reviewed previous GI notes colonoscopy and EGD reports  15 minutes time spent with patient > half in counseling coordination of care

## 2016-04-25 ENCOUNTER — Other Ambulatory Visit: Payer: Medicare Other | Admitting: *Deleted

## 2016-04-25 DIAGNOSIS — E7800 Pure hypercholesterolemia, unspecified: Secondary | ICD-10-CM | POA: Diagnosis not present

## 2016-04-25 NOTE — Addendum Note (Signed)
Addended by: Eulis Foster on: 04/25/2016 02:49 PM   Modules accepted: Orders

## 2016-04-26 LAB — HEPATIC FUNCTION PANEL
ALK PHOS: 50 IU/L (ref 39–117)
ALT: 13 IU/L (ref 0–32)
AST: 15 IU/L (ref 0–40)
Albumin: 4 g/dL (ref 3.5–4.8)
BILIRUBIN, DIRECT: 0.19 mg/dL (ref 0.00–0.40)
Bilirubin Total: 0.9 mg/dL (ref 0.0–1.2)
Total Protein: 6.4 g/dL (ref 6.0–8.5)

## 2016-04-26 LAB — LIPID PANEL
Chol/HDL Ratio: 3.1 ratio units (ref 0.0–4.4)
Cholesterol, Total: 173 mg/dL (ref 100–199)
HDL: 55 mg/dL (ref >39)
LDL Calculated: 92 mg/dL (ref 0–99)
TRIGLYCERIDES: 129 mg/dL (ref 0–149)
VLDL Cholesterol Cal: 26 mg/dL (ref 5–40)

## 2016-04-28 ENCOUNTER — Telehealth: Payer: Self-pay | Admitting: *Deleted

## 2016-04-28 DIAGNOSIS — I10 Essential (primary) hypertension: Secondary | ICD-10-CM | POA: Diagnosis not present

## 2016-04-28 DIAGNOSIS — Z6839 Body mass index (BMI) 39.0-39.9, adult: Secondary | ICD-10-CM | POA: Diagnosis not present

## 2016-04-28 DIAGNOSIS — M4120 Other idiopathic scoliosis, site unspecified: Secondary | ICD-10-CM | POA: Diagnosis not present

## 2016-04-28 DIAGNOSIS — M5137 Other intervertebral disc degeneration, lumbosacral region: Secondary | ICD-10-CM | POA: Diagnosis not present

## 2016-04-28 NOTE — Telephone Encounter (Signed)
Pt notified of lab results by phone with verbal understanding. Pt requested for lab to also be mailed to her as well. I verified pt address and will place labs in the mail today.

## 2016-05-01 DIAGNOSIS — E538 Deficiency of other specified B group vitamins: Secondary | ICD-10-CM | POA: Diagnosis not present

## 2016-06-04 DIAGNOSIS — E538 Deficiency of other specified B group vitamins: Secondary | ICD-10-CM | POA: Diagnosis not present

## 2016-06-05 ENCOUNTER — Encounter: Payer: Self-pay | Admitting: Neurology

## 2016-06-05 ENCOUNTER — Ambulatory Visit (INDEPENDENT_AMBULATORY_CARE_PROVIDER_SITE_OTHER): Payer: Medicare Other | Admitting: Neurology

## 2016-06-05 VITALS — BP 143/67 | HR 62 | Ht 59.0 in | Wt 203.5 lb

## 2016-06-05 DIAGNOSIS — G43119 Migraine with aura, intractable, without status migrainosus: Secondary | ICD-10-CM | POA: Diagnosis not present

## 2016-06-05 NOTE — Progress Notes (Signed)
Reason for visit: Migraine headache  [name] is an 76 y.o. female  History of present illness:  [name] is a 75 year old right-handed white female with a history of classic migraine headache. Since last seen she has done quite well with her headaches, she has had one visual aura and she took a Imitrex tablet as soon as she got the aura, and she sustained no headache. The patient otherwise has not had to take any medication since last seen. The patient is quite pleased with her progress. She returns for an evaluation.  Past Medical History:  Diagnosis Date  . Arthritis    "got alot; especially in my spine"  . Chest pain 02/2016  . Chronic back pain    "all of it"  . Classical migraine with intractable migraine 02/25/2016  . Diverticulosis of colon (without mention of hemorrhage)   . DVT of leg (deep venous thrombosis) ([place]) 09/2011   LLE  . Dysrhythmia    notes in epic  . Esophageal reflux   . Family history of malignant neoplasm of gastrointestinal tract   . Fibromyalgia    "dx'd w/it when it first came out"  . Hiatal hernia   . Hyperlipidemia   . Hypertension   . Migraines 09/24/11   "often recently;  more controlled now"  . Obesity, unspecified   . Osteoporosis   . Palpitations    event monitor 8/13: NSR, Sinus brady, Sinus Tachy, PAC  . Personal history of colonic polyps 05/31/2007   adenomatous polyp  . Pneumonia    "3 times at least" (09/24/11)  . Rheumatoid arthritis(714.0)   . Scoliosis   . Stricture and stenosis of esophagus   . Unspecified gastritis and gastroduodenitis without mention of hemorrhage     Past Surgical History:  Procedure Laterality Date  . BACK SURGERY    . BREAST CYST INCISION AND DRAINAGE  2012   left; "golf-ball sized"  . BREAST SURGERY     cyst lanced and drainded  . COLON SURGERY    . COLONOSCOPY  2009  . ESOPHAGEAL DILATION     "several times"  . EXPLORATORY LAPAROTOMY  04/2000   "for ruptured colon"  . HEMATOMA  EVACUATION  09/2011   LLE  . LUMBAR LAMINECTOMY/DECOMPRESSION MICRODISCECTOMY  09/03/2011   Procedure: LUMBAR LAMINECTOMY/DECOMPRESSION MICRODISCECTOMY 2 LEVELS;  Surgeon: Elaina Hoops, MD;  Location: [facility];  Service: Neurosurgery;  Laterality: Left;  Left Lumbar Three-Four, Lumbar Four-Five Decompressive Laminectomy  . SALIVARY GLAND SURGERY  ~ 1980   1 removed  . SHOULDER SURGERY  2008   right shoulder and collar bone  . SIGMOID RESECTION / RECTOPEXY  09/1999   Dr Rosana Hoes  . TRIGGER FINGER RELEASE  2004   left thumb  . TUMOR REMOVAL  1960's   left leg  . UPPER GASTROINTESTINAL ENDOSCOPY      Family History  Problem Relation Age of Onset  . Stroke Mother   . Hypertension Mother   . Stroke Father   . Hypertension Father   . Diabetes Father   . Pancreatic cancer Sister   . Diabetes Sister     x 2  . Colon cancer Sister 6  . Pancreatic cancer Sister   . Heart attack Neg Hx   . Stomach cancer Neg Hx     Social history:  reports that she has never smoked. She has never used smokeless tobacco. She reports that she does not drink alcohol or use drugs.  Allergies  Allergen Reactions  . Reglan [Metoclopramide] Other (See Comments)    Speech is garbled, extreme trouble making words    Medications:  Prior to Admission medications   Medication Sig Start Date End Date Taking? Authorizing Provider  aspirin EC 81 MG tablet Take 81 mg by mouth every 6 (six) hours as needed for moderate pain.    Yes Historical Provider, MD  calcium carbonate (TUMS EX) 750 MG chewable tablet Chew 2 tablets by mouth daily as needed for heartburn.    Yes Historical Provider, MD  Cyanocobalamin (VITAMIN B-12 IJ) Inject as directed every 30 (thirty) days.   Yes Historical Provider, MD  hydrochlorothiazide (HYDRODIURIL) 25 MG tablet Take 1 tablet (25 mg total) by mouth daily. 03/23/15  Yes Scott Joylene Draft, PA-C  metoprolol tartrate (LOPRESSOR) 25 MG tablet Take 12.5 mg by mouth 2 (two) times daily.   Yes  Historical Provider, MD  oxyCODONE-acetaminophen (PERCOCET/ROXICET) 5-325 MG tablet Take 1 tablet by mouth every 4 (four) hours as needed for severe pain.   Yes Historical Provider, MD  pantoprazole (PROTONIX) 40 MG tablet Take 1 tablet (40 mg total) by mouth 2 (two) times daily before a meal. 04/24/16  Yes Gatha Mayer, MD  polyethylene glycol (MIRALAX / GLYCOLAX) packet Take 17 g by mouth daily. Patient taking differently: Take 17 g by mouth daily as needed.  02/15/16  Yes Mir Marry Guan, MD  SUMAtriptan (IMITREX) 25 MG tablet Take 1 tablet (25 mg total) by mouth every 2 (two) hours as needed for migraine or headache. May repeat in 2 hours if headache persists or recurs. 02/15/16  Yes Mir Marry Guan, MD  lisinopril (PRINIVIL,ZESTRIL) 10 MG tablet Take 1 tablet (10 mg total) by mouth every evening. 01/21/16 04/20/16  Liliane Shi, PA-C  pravastatin (PRAVACHOL) 20 MG tablet Take 1 tablet (20 mg total) by mouth every evening. 01/21/16 04/20/16  Liliane Shi, PA-C    ROS:  Out of a complete 14 system review of symptoms, the patient complains only of the following symptoms, and all other reviewed systems are negative.  Migraine headache  Blood pressure (!) 143/67, pulse 62, height 4\' 11"  (1.499 m), weight 203 lb 8 oz (92.3 kg).  Physical Exam  General: The patient is alert and cooperative at the time of the examination. The patient is moderately to markedly obese.  Skin: No significant peripheral edema is noted.   Neurologic Exam  Mental status: The patient is alert and oriented x 3 at the time of the examination. The patient has apparent normal recent and remote memory, with an apparently normal attention span and concentration ability.   Cranial nerves: Facial symmetry is present. Speech is normal, no aphasia or dysarthria is noted. Extraocular movements are full. Visual fields are full.  Motor: The patient has good strength in all 4 extremities.  Sensory  examination: Soft touch sensation is symmetric on the face, arms, and legs.  Coordination: The patient has good finger-nose-finger and heel-to-shin bilaterally.  Gait and station: The patient has a limping type gait. Tandem gait is normal. Romberg is negative. No drift is seen.  Reflexes: Deep tendon reflexes are symmetric.   Assessment/Plan:  1. Migraine headache  The patient has done quite well with her headaches, she will take Imitrex if needed. If she is doing well, she will follow-up in one year, sooner if needed.  Jill Alexanders MD 06/05/2016 12:29 PM  [facility] [address] [place], [place] [address]  Phone [phone] Fax [phone]  

## 2016-06-17 ENCOUNTER — Other Ambulatory Visit: Payer: Self-pay | Admitting: *Deleted

## 2016-06-17 MED ORDER — METOPROLOL TARTRATE 25 MG PO TABS: 12.5000 mg | ORAL_TABLET | Freq: Two times a day (BID) | ORAL | 2 refills | Status: DC

## 2016-06-17 MED ORDER — HYDROCHLOROTHIAZIDE 25 MG PO TABS: 25.0000 mg | ORAL_TABLET | Freq: Every day | ORAL | 2 refills | Status: DC

## 2016-06-24 ENCOUNTER — Other Ambulatory Visit: Payer: Self-pay | Admitting: *Deleted

## 2016-06-24 MED ORDER — LISINOPRIL 10 MG PO TABS: 10.0000 mg | ORAL_TABLET | Freq: Every evening | ORAL | 2 refills | Status: DC

## 2016-06-26 ENCOUNTER — Telehealth: Payer: Self-pay | Admitting: *Deleted

## 2016-06-26 NOTE — Telephone Encounter (Signed)
Patient called to report that her rx for metoprolol was ordered incorrectly. It was sent in as below  Order Providers   Prescribing Provider Encounter Provider  Nelva Bush, MD Roberts Gaudy, CMA  Medication Detail    Disp Refills Start End   metoprolol tartrate (LOPRESSOR) 25 MG tablet 90 tablet 2 06/17/2016    Sig - Route: Take 0.5 tablets (12.5 mg total) by mouth 2 (two) times daily. - Oral   E-Prescribing Status: Receipt confirmed by pharmacy (06/17/2016 9:51 AM EDT)   Pharmacy   [place], [place]   Per patient she has been getting a 50 mg tablet and taking one-half of a tablet bid. This change was made to her med list at her last office visit but I do not see where Dr End told her to reduce her dose. She just received her shipment from [facility] for only ninety tablets. Please advise. Thanks, MI

## 2016-06-27 ENCOUNTER — Encounter: Payer: Self-pay | Admitting: Internal Medicine

## 2016-06-27 ENCOUNTER — Ambulatory Visit (INDEPENDENT_AMBULATORY_CARE_PROVIDER_SITE_OTHER): Payer: Medicare Other | Admitting: Internal Medicine

## 2016-06-27 VITALS — BP 118/68 | HR 72 | Ht 59.0 in | Wt 204.0 lb

## 2016-06-27 DIAGNOSIS — R1310 Dysphagia, unspecified: Secondary | ICD-10-CM | POA: Diagnosis not present

## 2016-06-27 DIAGNOSIS — Z86010 Personal history of colonic polyps: Secondary | ICD-10-CM

## 2016-06-27 DIAGNOSIS — K219 Gastro-esophageal reflux disease without esophagitis: Secondary | ICD-10-CM | POA: Diagnosis not present

## 2016-06-27 DIAGNOSIS — K222 Esophageal obstruction: Secondary | ICD-10-CM

## 2016-06-27 DIAGNOSIS — R1319 Other dysphagia: Secondary | ICD-10-CM

## 2016-06-27 MED ORDER — METOPROLOL TARTRATE 50 MG PO TABS: ORAL_TABLET | ORAL | 3 refills | Status: DC

## 2016-06-27 NOTE — Telephone Encounter (Signed)
If she feels well taking 25 mg BID, it is fine to refill her prescription for metoprolol tartrate 50 mg tablets, take 1/2 tablet BID. Thanks.

## 2016-06-27 NOTE — Telephone Encounter (Signed)
Pt's medication list at time of last office visit with Dr End January 2018 indicates pt had metoprolol tartrate 25 mg tablet directions 12.5 mg bid.  Pt states she has been on metoprolol 25 mg bid since December 2017, she has never taken 12.5 mg bid since December 2017.   Pt advised I will forward to Dr End for okay to refill metoprolol tartrate 50 mg tablet directions 1/2 tablet (25 mg) bid, for 90 days.

## 2016-06-27 NOTE — Telephone Encounter (Signed)
Pt states she feels fine on metoprolol tartrate 25 mg bid. Pt is aware I will send in a prescription for metoprolol tartrate 50 mg directions 1/2 tablet ( 25mg ) bid at her request.

## 2016-06-27 NOTE — Telephone Encounter (Signed)
Pt states since discharge from hospital in December 2017 she has been in metoprolol tartrate 25 mg bid--she has been taking 1/2 of a 50 mg tablet bid (she prefers this to a 25 mg tablet bid).

## 2016-06-27 NOTE — Progress Notes (Signed)
[name] 76 y.o. 01/23/1941 [id number]  Assessment & Plan:    Encounter Diagnoses  Name Primary?  . Esophageal dysphagia Yes  . GERD with stricture   . Hx of colonic polyps     EGD/dilation - symptomatic esophageal stricture suspected Colonoscopy - surveillance - polyps + FHx  The risks and benefits as well as alternatives of endoscopic procedure(s) have been discussed and reviewed. All questions answered. The patient agrees to proceed. She requests to be done at hospital due to prior colon surgery and just wantsmore back-up in case of complications. Wed June 13 130 PM  Clen Piq prep - she wants low volume prep  I appreciate the opportunity to care for this patient. [id number] R Rankins, MD   Subjective:   Chief Complaint: dysphagia, GERD, hx colon polyps  HPI Very nice 76 yo ww, last sen 2 mos ago - says GERD netter on bid PPI pantoprazole but having solid food dysphagia especially when eating out - has to regurgitate food.   Says she s ready for esophageal dilation. Also willing to schedule surveillance colonoscopy.  Allergies  Allergen Reactions  . Reglan [Metoclopramide] Other (See Comments)    Speech is garbled, extreme trouble making words   Current Meds  Medication Sig  . aspirin EC 81 MG tablet Take 81 mg by mouth every 6 (six) hours as needed for moderate pain.   . calcium carbonate (TUMS EX) 750 MG chewable tablet Chew 2 tablets by mouth daily as needed for heartburn.   . Cyanocobalamin (VITAMIN B-12 IJ) Inject as directed every 30 (thirty) days.  . hydrochlorothiazide (HYDRODIURIL) 25 MG tablet Take 1 tablet (25 mg total) by mouth daily.  [name] lisinopril (PRINIVIL,ZESTRIL) 10 MG tablet Take 1 tablet (10 mg total) by mouth every evening.  . metoprolol (LOPRESSOR) 50 MG tablet 1/2 tablet (25 mg) two times a day  . pantoprazole (PROTONIX) 40 MG tablet Take 1 tablet (40 mg total) by mouth 2 (two) times daily before a meal.  . polyethylene glycol  (MIRALAX / GLYCOLAX) packet Take 17 g by mouth daily. (Patient taking differently: Take 17 g by mouth daily as needed. )  . SUMAtriptan (IMITREX) 25 MG tablet Take 1 tablet (25 mg total) by mouth every 2 (two) hours as needed for migraine or headache. May repeat in 2 hours if headache persists or recurs.   Past Medical History:  Diagnosis Date  . Arthritis    "got alot; especially in my spine"  . Chest pain 02/2016  . Chronic back pain    "all of it"  . Classical migraine with intractable migraine 02/25/2016  . Diverticulosis of colon (without mention of hemorrhage)   . DVT of leg (deep venous thrombosis) ([place]) 09/2011   LLE  . Dysrhythmia    notes in epic  . Esophageal reflux   . Family history of malignant neoplasm of gastrointestinal tract   . Fibromyalgia    "dx'd w/it when it first came out"  . Hiatal hernia   . Hyperlipidemia   . Hypertension   . Migraines 09/24/11   "often recently;  more controlled now"  . Obesity, unspecified   . Osteoporosis   . Palpitations    event monitor 8/13: NSR, Sinus brady, Sinus Tachy, PAC  . Personal history of colonic polyps 05/31/2007   adenomatous polyp  . Pneumonia    "3 times at least" (09/24/11)  . Rheumatoid arthritis(714.0)   . Scoliosis   . Stricture and stenosis of esophagus   .  Unspecified gastritis and gastroduodenitis without mention of hemorrhage    Past Surgical History:  Procedure Laterality Date  . BACK SURGERY    . BREAST CYST INCISION AND DRAINAGE  2012   left; "golf-ball sized"  . BREAST SURGERY     cyst lanced and drainded  . COLON SURGERY    . COLONOSCOPY  2009  . ESOPHAGEAL DILATION     "several times"  . EXPLORATORY LAPAROTOMY  04/2000   "for ruptured colon"  . HEMATOMA EVACUATION  09/2011   LLE  . LUMBAR LAMINECTOMY/DECOMPRESSION MICRODISCECTOMY  09/03/2011   Procedure: LUMBAR LAMINECTOMY/DECOMPRESSION MICRODISCECTOMY 2 LEVELS;  Surgeon: Elaina Hoops, MD;  Location: [facility];  Service: Neurosurgery;   Laterality: Left;  Left Lumbar Three-Four, Lumbar Four-Five Decompressive Laminectomy  . SALIVARY GLAND SURGERY  ~ 1980   1 removed  . SHOULDER SURGERY  2008   right shoulder and collar bone  . SIGMOID RESECTION / RECTOPEXY  09/1999   Dr Rosana Hoes  . TRIGGER FINGER RELEASE  2004   left thumb  . TUMOR REMOVAL  1960's   left leg  . UPPER GASTROINTESTINAL ENDOSCOPY     Social History   Social History  . Marital status: Married    Spouse name: N/A  . Number of children: 1  . 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  Retired   Social History Main Topics  . Smoking status: Never Smoker  . Smokeless tobacco: Never Used  . Alcohol use No  . Drug use: No  . Sexual activity: Not Currently    Partners: Male    Birth control/ protection: Post-menopausal   Other Topics Concern  . None   Social History Narrative   Lives at home w/ her husband   Right-handed   Caffeine: usually drinks decaf and tea   family history includes Colon cancer (age of onset: 56) in her sister; Diabetes in her father and sister; Hypertension in her father and mother; Pancreatic cancer in her sister and sister; Stroke in her father and mother.     Review of Systems As above  Objective:   Physical Exam @BP  118/68   Pulse 72   Ht 4\' 11"  (1.499 m)   Wt 204 lb (92.5 kg)   BMI 41.20 kg/m @  General:  NAD - obese Eyes:   anicteric Lungs:  clear Heart::  S1S2 no rubs, murmurs or gallops     15 minutes time spent with patient > half in counseling coordination of care

## 2016-06-27 NOTE — Patient Instructions (Addendum)
You have been scheduled for an endoscopy and colonoscopy at the hospital. Please follow the written instructions given to you at your visit today. Please use your clenpiq sample kit. If you use inhalers (even only as needed), please bring them with you on the day of your procedure.   You may have a light breakfast the morning of prep day (the day before the procedure).   You may choose from one of the following items: eggs and toast or chicken noodle soup and crackers.   You should have your breakfast completed between 8:00 and 9:00 am the day before your procedure.  After you have had your light breakfast you should start a clear liquid dietonly NO SOLIDS. No additional solid food is allowed. You may continue to have clear liquid up to 3 hours prior to your procedure.    I appreciate the opportunity to care for you. Silvano Rusk, MD, [facility]

## 2016-07-07 DIAGNOSIS — E538 Deficiency of other specified B group vitamins: Secondary | ICD-10-CM | POA: Diagnosis not present

## 2016-08-07 DIAGNOSIS — N952 Postmenopausal atrophic vaginitis: Secondary | ICD-10-CM | POA: Diagnosis not present

## 2016-08-07 DIAGNOSIS — N3941 Urge incontinence: Secondary | ICD-10-CM | POA: Diagnosis not present

## 2016-08-07 DIAGNOSIS — E538 Deficiency of other specified B group vitamins: Secondary | ICD-10-CM | POA: Diagnosis not present

## 2016-08-07 DIAGNOSIS — R3915 Urgency of urination: Secondary | ICD-10-CM | POA: Diagnosis not present

## 2016-08-07 DIAGNOSIS — R102 Pelvic and perineal pain: Secondary | ICD-10-CM | POA: Diagnosis not present

## 2016-08-08 DIAGNOSIS — I639 Cerebral infarction, unspecified: Secondary | ICD-10-CM

## 2016-08-08 HISTORY — DX: Cerebral infarction, unspecified: I63.9

## 2016-08-12 DIAGNOSIS — R102 Pelvic and perineal pain: Secondary | ICD-10-CM | POA: Diagnosis not present

## 2016-08-12 DIAGNOSIS — N952 Postmenopausal atrophic vaginitis: Secondary | ICD-10-CM | POA: Diagnosis not present

## 2016-08-18 ENCOUNTER — Emergency Department (HOSPITAL_COMMUNITY): Payer: Medicare Other

## 2016-08-18 ENCOUNTER — Telehealth: Payer: Self-pay | Admitting: Neurology

## 2016-08-18 ENCOUNTER — Encounter (HOSPITAL_COMMUNITY): Payer: Self-pay

## 2016-08-18 ENCOUNTER — Observation Stay (HOSPITAL_COMMUNITY): Payer: Medicare Other

## 2016-08-18 ENCOUNTER — Inpatient Hospital Stay (HOSPITAL_COMMUNITY)
Admission: EM | Admit: 2016-08-18 | Discharge: 2016-08-21 | DRG: 092 | Disposition: A | Discharge status: Home / Self Care | Payer: Medicare Other | Attending: Family Medicine | Admitting: Family Medicine

## 2016-08-18 ENCOUNTER — Telehealth: Payer: Self-pay | Admitting: Internal Medicine

## 2016-08-18 DIAGNOSIS — Z7982 Long term (current) use of aspirin: Secondary | ICD-10-CM | POA: Diagnosis not present

## 2016-08-18 DIAGNOSIS — Z8249 Family history of ischemic heart disease and other diseases of the circulatory system: Secondary | ICD-10-CM

## 2016-08-18 DIAGNOSIS — E785 Hyperlipidemia, unspecified: Secondary | ICD-10-CM | POA: Diagnosis present

## 2016-08-18 DIAGNOSIS — Z888 Allergy status to other drugs, medicaments and biological substances status: Secondary | ICD-10-CM | POA: Diagnosis not present

## 2016-08-18 DIAGNOSIS — E860 Dehydration: Secondary | ICD-10-CM | POA: Diagnosis present

## 2016-08-18 DIAGNOSIS — G35 Multiple sclerosis: Secondary | ICD-10-CM | POA: Diagnosis not present

## 2016-08-18 DIAGNOSIS — I10 Essential (primary) hypertension: Secondary | ICD-10-CM | POA: Diagnosis not present

## 2016-08-18 DIAGNOSIS — M419 Scoliosis, unspecified: Secondary | ICD-10-CM | POA: Diagnosis present

## 2016-08-18 DIAGNOSIS — G8929 Other chronic pain: Secondary | ICD-10-CM | POA: Diagnosis present

## 2016-08-18 DIAGNOSIS — Z86718 Personal history of other venous thrombosis and embolism: Secondary | ICD-10-CM

## 2016-08-18 DIAGNOSIS — M1990 Unspecified osteoarthritis, unspecified site: Secondary | ICD-10-CM | POA: Diagnosis present

## 2016-08-18 DIAGNOSIS — Z833 Family history of diabetes mellitus: Secondary | ICD-10-CM | POA: Diagnosis not present

## 2016-08-18 DIAGNOSIS — K219 Gastro-esophageal reflux disease without esophagitis: Secondary | ICD-10-CM | POA: Diagnosis present

## 2016-08-18 DIAGNOSIS — Z800 Family history of malignant neoplasm of digestive organs: Secondary | ICD-10-CM

## 2016-08-18 DIAGNOSIS — Z823 Family history of stroke: Secondary | ICD-10-CM | POA: Diagnosis not present

## 2016-08-18 DIAGNOSIS — G9340 Encephalopathy, unspecified: Secondary | ICD-10-CM | POA: Diagnosis present

## 2016-08-18 DIAGNOSIS — G43119 Migraine with aura, intractable, without status migrainosus: Secondary | ICD-10-CM

## 2016-08-18 DIAGNOSIS — I672 Cerebral atherosclerosis: Secondary | ICD-10-CM | POA: Diagnosis present

## 2016-08-18 DIAGNOSIS — T50905A Adverse effect of unspecified drugs, medicaments and biological substances, initial encounter: Secondary | ICD-10-CM | POA: Diagnosis present

## 2016-08-18 DIAGNOSIS — R519 Headache, unspecified: Secondary | ICD-10-CM | POA: Diagnosis present

## 2016-08-18 DIAGNOSIS — M797 Fibromyalgia: Secondary | ICD-10-CM | POA: Diagnosis not present

## 2016-08-18 DIAGNOSIS — R4182 Altered mental status, unspecified: Secondary | ICD-10-CM | POA: Diagnosis not present

## 2016-08-18 DIAGNOSIS — G44201 Tension-type headache, unspecified, intractable: Secondary | ICD-10-CM | POA: Diagnosis not present

## 2016-08-18 DIAGNOSIS — E669 Obesity, unspecified: Secondary | ICD-10-CM | POA: Diagnosis present

## 2016-08-18 DIAGNOSIS — Z7951 Long term (current) use of inhaled steroids: Secondary | ICD-10-CM | POA: Diagnosis not present

## 2016-08-18 DIAGNOSIS — R7881 Bacteremia: Secondary | ICD-10-CM | POA: Diagnosis not present

## 2016-08-18 DIAGNOSIS — J9811 Atelectasis: Secondary | ICD-10-CM | POA: Diagnosis not present

## 2016-08-18 DIAGNOSIS — K449 Diaphragmatic hernia without obstruction or gangrene: Secondary | ICD-10-CM | POA: Diagnosis present

## 2016-08-18 DIAGNOSIS — Z8701 Personal history of pneumonia (recurrent): Secondary | ICD-10-CM

## 2016-08-18 DIAGNOSIS — Z6836 Body mass index (BMI) 36.0-36.9, adult: Secondary | ICD-10-CM

## 2016-08-18 DIAGNOSIS — R51 Headache: Secondary | ICD-10-CM | POA: Diagnosis not present

## 2016-08-18 DIAGNOSIS — A419 Sepsis, unspecified organism: Secondary | ICD-10-CM | POA: Diagnosis present

## 2016-08-18 DIAGNOSIS — E872 Acidosis: Secondary | ICD-10-CM | POA: Diagnosis present

## 2016-08-18 DIAGNOSIS — G92 Toxic encephalopathy: Principal | ICD-10-CM | POA: Diagnosis present

## 2016-08-18 DIAGNOSIS — I119 Hypertensive heart disease without heart failure: Secondary | ICD-10-CM | POA: Diagnosis present

## 2016-08-18 DIAGNOSIS — M069 Rheumatoid arthritis, unspecified: Secondary | ICD-10-CM | POA: Diagnosis not present

## 2016-08-18 DIAGNOSIS — R509 Fever, unspecified: Secondary | ICD-10-CM | POA: Diagnosis present

## 2016-08-18 DIAGNOSIS — W57XXXA Bitten or stung by nonvenomous insect and other nonvenomous arthropods, initial encounter: Secondary | ICD-10-CM | POA: Diagnosis present

## 2016-08-18 DIAGNOSIS — G43819 Other migraine, intractable, without status migrainosus: Secondary | ICD-10-CM | POA: Diagnosis not present

## 2016-08-18 LAB — CBC
HEMATOCRIT: 45.5 % (ref 36.0–46.0)
HEMOGLOBIN: 15.3 g/dL — AB (ref 12.0–15.0)
MCH: 28.1 pg (ref 26.0–34.0)
MCHC: 33.6 g/dL (ref 30.0–36.0)
MCV: 83.6 fL (ref 78.0–100.0)
Platelets: 233 10*3/uL (ref 150–400)
RBC: 5.44 MIL/uL — AB (ref 3.87–5.11)
RDW: 13.2 % (ref 11.5–15.5)
WBC: 11 10*3/uL — AB (ref 4.0–10.5)

## 2016-08-18 LAB — DIFFERENTIAL
BASOS ABS: 0 10*3/uL (ref 0.0–0.1)
BASOS PCT: 0 %
Eosinophils Absolute: 0 10*3/uL (ref 0.0–0.7)
Eosinophils Relative: 0 %
LYMPHS PCT: 10 %
Lymphs Abs: 1.1 10*3/uL (ref 0.7–4.0)
MONO ABS: 0.3 10*3/uL (ref 0.1–1.0)
MONOS PCT: 3 %
NEUTROS ABS: 9.6 10*3/uL — AB (ref 1.7–7.7)
Neutrophils Relative %: 87 %

## 2016-08-18 LAB — I-STAT ARTERIAL BLOOD GAS, ED
ACID-BASE EXCESS: 1 mmol/L (ref 0.0–2.0)
Bicarbonate: 24.5 mmol/L (ref 20.0–28.0)
O2 Saturation: 95 %
PH ART: 7.457 — AB (ref 7.350–7.450)
PO2 ART: 77 mmHg — AB (ref 83.0–108.0)
TCO2: 26 mmol/L (ref 0–100)
pCO2 arterial: 35.1 mmHg (ref 32.0–48.0)

## 2016-08-18 LAB — URINALYSIS, ROUTINE W REFLEX MICROSCOPIC
BILIRUBIN URINE: NEGATIVE
Glucose, UA: NEGATIVE mg/dL
HGB URINE DIPSTICK: NEGATIVE
Ketones, ur: 20 mg/dL — AB
Leukocytes, UA: NEGATIVE
NITRITE: NEGATIVE
PROTEIN: NEGATIVE mg/dL
Specific Gravity, Urine: 1.016 (ref 1.005–1.030)
pH: 7 (ref 5.0–8.0)

## 2016-08-18 LAB — COMPREHENSIVE METABOLIC PANEL
ALK PHOS: 54 U/L (ref 38–126)
ALT: 16 U/L (ref 14–54)
AST: 28 U/L (ref 15–41)
Albumin: 4.1 g/dL (ref 3.5–5.0)
Anion gap: 12 (ref 5–15)
BUN: 16 mg/dL (ref 6–20)
CALCIUM: 8.9 mg/dL (ref 8.9–10.3)
CHLORIDE: 101 mmol/L (ref 101–111)
CO2: 23 mmol/L (ref 22–32)
CREATININE: 1.15 mg/dL — AB (ref 0.44–1.00)
GFR, EST AFRICAN AMERICAN: 53 mL/min — AB (ref >60)
GFR, EST NON AFRICAN AMERICAN: 45 mL/min — AB (ref >60)
Glucose, Bld: 126 mg/dL — ABNORMAL HIGH (ref 65–99)
Potassium: 4.1 mmol/L (ref 3.5–5.1)
Sodium: 136 mmol/L (ref 135–145)
Total Bilirubin: 1.6 mg/dL — ABNORMAL HIGH (ref 0.3–1.2)
Total Protein: 6.8 g/dL (ref 6.5–8.1)

## 2016-08-18 LAB — I-STAT TROPONIN, ED: TROPONIN I, POC: 0 ng/mL (ref 0.00–0.08)

## 2016-08-18 LAB — RAPID URINE DRUG SCREEN, HOSP PERFORMED
AMPHETAMINES: NOT DETECTED
BARBITURATES: NOT DETECTED
BENZODIAZEPINES: NOT DETECTED
Cocaine: NOT DETECTED
Opiates: NOT DETECTED
Tetrahydrocannabinol: NOT DETECTED

## 2016-08-18 LAB — I-STAT CHEM 8, ED
BUN: 19 mg/dL (ref 6–20)
CALCIUM ION: 1.01 mmol/L — AB (ref 1.15–1.40)
CHLORIDE: 101 mmol/L (ref 101–111)
CREATININE: 1.1 mg/dL — AB (ref 0.44–1.00)
GLUCOSE: 124 mg/dL — AB (ref 65–99)
HCT: 46 % (ref 36.0–46.0)
Hemoglobin: 15.6 g/dL — ABNORMAL HIGH (ref 12.0–15.0)
POTASSIUM: 4.1 mmol/L (ref 3.5–5.1)
Sodium: 137 mmol/L (ref 135–145)
TCO2: 24 mmol/L (ref 0–100)

## 2016-08-18 LAB — I-STAT CG4 LACTIC ACID, ED: LACTIC ACID, VENOUS: 4.7 mmol/L — AB (ref 0.5–1.9)

## 2016-08-18 LAB — PROTIME-INR
INR: 0.94
Prothrombin Time: 12.6 seconds (ref 11.4–15.2)

## 2016-08-18 LAB — ETHANOL: Alcohol, Ethyl (B): <5 mg/dL (ref <5)

## 2016-08-18 LAB — APTT: APTT: 27 s (ref 24–36)

## 2016-08-18 MED ORDER — ACETAMINOPHEN 650 MG RE SUPP
650.0000 mg | RECTAL | Status: DC | PRN
  Administered 2016-08-18: 650 mg via RECTAL
  Filled 2016-08-18: qty 1

## 2016-08-18 MED ORDER — ACETAMINOPHEN 160 MG/5ML PO SOLN: 650.0000 mg | ORAL | Status: DC | PRN

## 2016-08-18 MED ORDER — SUMATRIPTAN SUCCINATE 100 MG PO TABS: 100.0000 mg | ORAL_TABLET | Freq: Two times a day (BID) | ORAL | 5 refills | Status: DC | PRN

## 2016-08-18 MED ORDER — ACETAMINOPHEN 325 MG PO TABS
650.0000 mg | ORAL_TABLET | ORAL | Status: DC | PRN
Start: 2016-08-18 — End: 2016-08-21

## 2016-08-18 MED ORDER — DOXYCYCLINE HYCLATE 100 MG IV SOLR
100.0000 mg | Freq: Two times a day (BID) | INTRAVENOUS | Status: DC
  Administered 2016-08-18 – 2016-08-20 (×4): 100 mg via INTRAVENOUS
  Filled 2016-08-18 (×6): qty 100

## 2016-08-18 MED ORDER — ONDANSETRON HCL 4 MG/2ML IJ SOLN
4.0000 mg | Freq: Once | INTRAMUSCULAR | Status: AC
  Administered 2016-08-18: 4 mg via INTRAVENOUS
  Filled 2016-08-18: qty 2

## 2016-08-18 MED ORDER — PROMETHAZINE HCL 25 MG PO TABS: 25.0000 mg | ORAL_TABLET | Freq: Four times a day (QID) | ORAL | 2 refills | Status: DC | PRN

## 2016-08-18 MED ORDER — DEXTROSE 5 % IV SOLN
2.0000 g | Freq: Once | INTRAVENOUS | Status: AC
  Administered 2016-08-18: 2 g via INTRAVENOUS
  Filled 2016-08-18: qty 2

## 2016-08-18 MED ORDER — VANCOMYCIN HCL IN DEXTROSE 750-5 MG/150ML-% IV SOLN
750.0000 mg | Freq: Two times a day (BID) | INTRAVENOUS | Status: DC
  Administered 2016-08-18 – 2016-08-20 (×4): 750 mg via INTRAVENOUS
  Filled 2016-08-18 (×5): qty 150

## 2016-08-18 MED ORDER — SODIUM CHLORIDE 0.9 % IV BOLUS (SEPSIS)
2200.0000 mL | Freq: Once | INTRAVENOUS | Status: AC
  Administered 2016-08-18: 2200 mL via INTRAVENOUS

## 2016-08-18 MED ORDER — SODIUM CHLORIDE 0.9 % IV BOLUS (SEPSIS)
500.0000 mL | Freq: Once | INTRAVENOUS | Status: AC
  Administered 2016-08-18: 500 mL via INTRAVENOUS

## 2016-08-18 MED ORDER — SODIUM CHLORIDE 0.9 % IV SOLN
INTRAVENOUS | Status: DC
  Administered 2016-08-18 – 2016-08-20 (×3): via INTRAVENOUS

## 2016-08-18 MED ORDER — FENTANYL CITRATE (PF) 100 MCG/2ML IJ SOLN
50.0000 ug | Freq: Once | INTRAMUSCULAR | Status: AC
  Administered 2016-08-18: 50 ug via INTRAVENOUS
  Filled 2016-08-18: qty 2

## 2016-08-18 NOTE — Progress Notes (Signed)
Consulted Dr. Tamela Gammon, Anesthesia about EKG from 02/12/2016 and per Dr. Tobias Alexander, EKG is OK for procedure.

## 2016-08-18 NOTE — Progress Notes (Signed)
Pharmacy Antibiotic Note  [name] is a 76 y.o. female admitted on 08/18/2016 with sepsis.  Pharmacy has been consulted for vancomycin dosing.  Plan: Vancomycin 750mg  IV every 12 hours.  Goal trough 15-20 mcg/mL.  Monitor culture data, renal function and clinical course VT at SS prn  Height: 5\' 2"  (157.5 cm) Weight: 198 lb (89.8 kg) IBW/kg (Calculated) : 50.1  Temp (24hrs), Avg:99.5 F (37.5 C), Min:98.3 F (36.8 C), Max:100.7 F (38.2 C)   Recent Labs Lab 08/18/16 1903 08/18/16 1904 08/18/16 2215  WBC  --  11.0*  --   CREATININE 1.10* 1.15*  --   LATICACIDVEN  --   --  4.70*    Estimated Creatinine Clearance: 44 mL/min (A) (by C-G formula based on SCr of 1.15 mg/dL (H)).    Allergies  Allergen Reactions  . Reglan [Metoclopramide] Other (See Comments)    Speech is garbled, extreme trouble making words     Andrey Cota. Diona Foley, PharmD, BCPS Clinical Pharmacist [phone] 08/18/2016 10:40 PM

## 2016-08-18 NOTE — Telephone Encounter (Signed)
The patient will be coming in today for an IV injection of 1 g of Depacon and 30 mg of Toradol IV.

## 2016-08-18 NOTE — ED Triage Notes (Signed)
Per Pt's Husband, Pt is coming from [facility] office. Pt started to have AMS today around 11 with complex migraine symptoms. Pt took medications at home with no relief. Pt went to [facility] to be evaluated. Pt was given migraine cocktail with no relief. Pt was sent to the ED for evaluation due to no relief from medications. Strength intact bilaterally

## 2016-08-18 NOTE — Telephone Encounter (Signed)
Pt called back said she needs something for nausea also

## 2016-08-18 NOTE — Telephone Encounter (Signed)
Called and spoke to husband. They will come for IV infusion. They will be here within 30-45min. Mindy in intrafusion aware. Husband verbalized understanding.

## 2016-08-18 NOTE — Telephone Encounter (Signed)
Pt needs refill for SUMAtriptan (IMITREX) 25 MG tablet sent to [facility]. Pt said last RX came from the hospital. Pt said HA has started this morning. She took the last 2 pills.

## 2016-08-18 NOTE — Addendum Note (Signed)
Addended by: Kathrynn Ducking on: 08/18/2016 02:08 PM   Modules accepted: Orders

## 2016-08-18 NOTE — H&P (Signed)
History and Physical    [name] NOM:[id number] DOB: 06-Apr-1940 DOA: 08/18/2016  PCP: Megan Nip, MD   Patient coming from: Neurology office [name])  Chief Complaint: Intractable headache and mental status changes  HPI: [name] is a 76 y.o. woman with a history of RA, severe scoliosis, chronic back pain, lumbar laminectomy, GERD with esophageal stricture, DVT in 2013, HTN, HLD, and migraine headaches with aura who presented to [name]' office today for management of intractable headache, presentation consistent with prior episodes of migraine headaches for her.  Headache was refractory to two imitrex tablets at home.  In the office, she received 1 gram of depacon, 30mg  of IV toradol, 25mg  of IV phenergan, and 500cc of NS.  Headache was refractory and she was referred to the ED for further evaluation and management.  ED Course: The patient received IV fentanyl 25mcg and 4mg  of IV zofran in the ED.  Headache resolved, but she continued to demonstrate increased somnolence and lethargy.  Hospitalist was asked to admit the patient.  At the time of my initial assessment, the patient was altered but following some commands.  She resisted by attempts to open her eyes.  She did not have evidence of adequate judgment or insight.  Her husband had already gone home and did not answer at either number listed on the facesheet.  She had a mild leukocytosis of 11.  Hgb 15.  Ketones in her urine but no evidence of infection.  Head CT negative for acute process.  EKG negative for acute changes.  Troponin negative.  The initial plan was to place the patient in observation with neurochecks, hydrate, and repeat assessment in the AM.  Mental status was attributed to polypharmacy.  However, I was approached directly by the ED attending about one hour after my initial assessment and advised that the patient was awake and alert, following commands, and able to give her own clinical  history.  She also developed a fever to 100.7 (rectal).  The patient was able to give a history of tick bite to her right leg on Friday while working outside in her yard.  She pulled the tick off, wrapped it up, and put it in her purse.  She had not had any fever or sweats before tonight.  She had one episode of chills that she did not think much of.  She had mild nausea today which she attributed to her migraine headache.  No rash. No diarrhea.  No chest pain.  No shortness of breath.  No swelling.  She has congestion which she attributes to allergies.  After fever was found, the ED attending ordered a lactic acid level which was elevated at 4.7.  Code Sepsis was activated.  The patient received 30cc/kg of NS.  She also received empiric Rocephin, vanc, and doxycycline.  Blood cultures were obtained and antibodies for RMSF, Borrelia, and Ehrlichia were ordered.  Chest xray showed cardiomegaly with minimal bibasilar atelectasis.  LP was offered in the ED and discussed with the patient by me, as well as the ED attending.  Risks vs benefits of the procedure were reviewed at length.  Patient's questions were answered to her apparent satisfaction.  She ultimately declined bedside LP in the ED; she would rather wait until the morning to have the procedure done under fluoro.  Admission orders were revised, and ampicillin was added to her empiric coverage per sepsis protocol for possible meningitis.  Review of Systems: 10 systems reviewed and  negative except as stated in the HPI.   Past Medical History:  Diagnosis Date  . Arthritis    "got alot; especially in my spine"  . Chest pain 02/2016  . Chronic back pain    "all of it"  . Classical migraine with intractable migraine 02/25/2016  . Diverticulosis of colon (without mention of hemorrhage)   . DVT of leg (deep venous thrombosis) ([place]) 09/2011   LLE  . Dysrhythmia    notes in epic  . Esophageal reflux   . Family history of malignant neoplasm of  gastrointestinal tract   . Fibromyalgia    "dx'd w/it when it first came out"  . Hiatal hernia   . Hyperlipidemia   . Hypertension   . Migraines 09/24/11   "often recently;  more controlled now"  . Obesity, unspecified   . Osteoporosis   . Palpitations    event monitor 8/13: NSR, Sinus brady, Sinus Tachy, PAC  . Personal history of colonic polyps 05/31/2007   adenomatous polyp  . Pneumonia    "3 times at least" (09/24/11)  . Rheumatoid arthritis(714.0)   . Scoliosis   . Stricture and stenosis of esophagus   . Unspecified gastritis and gastroduodenitis without mention of hemorrhage     Past Surgical History:  Procedure Laterality Date  . BACK SURGERY    . BREAST CYST INCISION AND DRAINAGE  2012   left; "golf-ball sized"  . BREAST SURGERY     cyst lanced and drainded  . COLON SURGERY    . COLONOSCOPY  2009  . ESOPHAGEAL DILATION     "several times"  . EXPLORATORY LAPAROTOMY  04/2000   "for ruptured colon"  . HEMATOMA EVACUATION  09/2011   LLE  . LUMBAR LAMINECTOMY/DECOMPRESSION MICRODISCECTOMY  09/03/2011   Procedure: LUMBAR LAMINECTOMY/DECOMPRESSION MICRODISCECTOMY 2 LEVELS;  Surgeon: Elaina Hoops, MD;  Location: [facility];  Service: Neurosurgery;  Laterality: Left;  Left Lumbar Three-Four, Lumbar Four-Five Decompressive Laminectomy  . SALIVARY GLAND SURGERY  ~ 1980   1 removed  . SHOULDER SURGERY  2008   right shoulder and collar bone  . SIGMOID RESECTION / RECTOPEXY  09/1999   Dr Rosana Hoes  . TRIGGER FINGER RELEASE  2004   left thumb  . TUMOR REMOVAL  1960's   left leg  . UPPER GASTROINTESTINAL ENDOSCOPY      SOCIAL HISTORY (previously documented): No active tobacco, EtOH, or illicit drug use.  Allergies  Allergen Reactions  . Reglan [Metoclopramide] Other (See Comments)    Speech is garbled, extreme trouble making words    Family History  Problem Relation Age of Onset  . Stroke Mother   . Hypertension Mother   . Stroke Father   . Hypertension Father   .  Diabetes Father   . Pancreatic cancer Sister   . Diabetes Sister        x 2  . Colon cancer Sister 108  . Pancreatic cancer Sister   . Heart attack Neg Hx   . Stomach cancer Neg Hx      Prior to Admission medications   Medication Sig Start Date End Date Taking? Authorizing Provider  SUMAtriptan (IMITREX) 100 MG tablet Take 1 tablet (100 mg total) by mouth 2 (two) times daily as needed for migraine. 08/18/16  Yes Kathrynn Ducking, MD  albuterol (PROVENTIL HFA;VENTOLIN HFA) 108 (90 Base) MCG/ACT inhaler Inhale 2 puffs into the lungs every 6 (six) hours as needed for wheezing or shortness of breath.    [provider]  aspirin EC 81 MG tablet Take 81 mg by mouth 2 (two) times a week.     [provider]  calcium carbonate (TUMS EX) 750 MG chewable tablet Chew 2 tablets by mouth daily as needed for heartburn.     [provider]  Cyanocobalamin (VITAMIN B-12 IJ) Inject as directed every 30 (thirty) days.    [provider]  fluticasone (FLONASE) 50 MCG/ACT nasal spray Place 1 spray into both nostrils daily as needed for allergies. 08/02/16   [provider]  hydrochlorothiazide (HYDRODIURIL) 25 MG tablet Take 1 tablet (25 mg total) by mouth daily. 06/17/16   End, Harrell Gave, MD  lisinopril (PRINIVIL,ZESTRIL) 10 MG tablet Take 1 tablet (10 mg total) by mouth every evening. 06/24/16 09/22/16  End, Harrell Gave, MD  loratadine (CLARITIN) 10 MG tablet Take 10 mg by mouth daily as needed for allergies.    [provider]  metoprolol (LOPRESSOR) 50 MG tablet 1/2 tablet (25 mg) two times a day 06/27/16   End, Harrell Gave, MD  pantoprazole (PROTONIX) 40 MG tablet Take 1 tablet (40 mg total) by mouth 2 (two) times daily before a meal. 04/24/16   Gatha Mayer, MD  polyethylene glycol (MIRALAX / Floria Raveling) packet Take 17 g by mouth daily. Patient taking differently: Take 17 g by mouth daily as needed.  02/15/16   Hollice Gong, Mir Mohammed, MD  pravastatin  (PRAVACHOL) 20 MG tablet Take 1 tablet (20 mg total) by mouth every evening. 01/21/16 04/20/16  Richardson Dopp T, PA-C  pravastatin (PRAVACHOL) 20 MG tablet Take 20 mg by mouth every evening.    [provider]  promethazine (PHENERGAN) 25 MG tablet Take 1 tablet (25 mg total) by mouth every 6 (six) hours as needed for nausea or vomiting. 08/18/16   Kathrynn Ducking, MD    Physical Exam: Vitals:   08/18/16 2000 08/18/16 2015 08/18/16 2019 08/18/16 2030  BP: (!) 119/42   (!) 110/26  Pulse: 89 87  87  Resp: 17 (!) 23 18 (!) 24  Temp:      TempSrc:      SpO2: 97% 97% 100% 95%  Weight:      Height:          Constitutional: NAD, calm, comfortable, NONtoxic appearing Vitals:   08/18/16 2000 08/18/16 2015 08/18/16 2019 08/18/16 2030  BP: (!) 119/42   (!) 110/26  Pulse: 89 87  87  Resp: 17 (!) 23 18 (!) 24  Temp:      TempSrc:      SpO2: 97% 97% 100% 95%  Weight:      Height:       Eyes: PERRL, lids and conjunctivae appear normal ENMT: Tongue is dry. Unable to visualize posterior oropharynx.  She is not wearing her upper dentures. Neck: thick but supple.  Denies tenderness to palpation over her cervical spine. Respiratory: clear to auscultation listening anteriorly.  No wheezing.  Normal respiratory effort. No accessory muscle use.  Cardiovascular: Normal rate, regular rhythm, no murmurs / rubs / gallops. No extremity edema. 2+ pedal pulses. GI: abdomen is obese but soft and compressible.  No distention.  No tenderness.  Bowel sounds are present. Musculoskeletal:  No joint deformity in upper and lower extremities.  No contractures.  Normal muscle tone.  Skin: erythematous area to medial aspect of her right leg consistent with site of prior tick bite, no disseminated rashes, pale, cool, dry Neurologic: CN 2-12 grossly intact.  Moves all four extremities spontaneously and  on command.   Psychiatric: Now awake, alert, and oriented x 4.  Normal insight and judgment.  Affect  appropriate.    Labs on Admission: I have personally reviewed following labs and imaging studies  CBC:  Recent Labs Lab 08/18/16 1903 08/18/16 1904  WBC  --  11.0*  NEUTROABS  --  9.6*  HGB 15.6* 15.3*  HCT 46.0 45.5  MCV  --  83.6  PLT  --  979   Basic Metabolic Panel:  Recent Labs Lab 08/18/16 1903 08/18/16 1904  NA 137 136  K 4.1 4.1  CL 101 101  CO2  --  23  GLUCOSE 124* 126*  BUN 19 16  CREATININE 1.10* 1.15*  CALCIUM  --  8.9   GFR: Estimated Creatinine Clearance: 44 mL/min (A) (by C-G formula based on SCr of 1.15 mg/dL (H)). Liver Function Tests:  Recent Labs Lab 08/18/16 1904  AST 28  ALT 16  ALKPHOS 54  BILITOT 1.6*  PROT 6.8  ALBUMIN 4.1   Coagulation Profile:  Recent Labs Lab 08/18/16 1904  INR 0.94   Urine analysis:    Component Value Date/Time   COLORURINE YELLOW 08/18/2016 2013   APPEARANCEUR CLEAR 08/18/2016 2013   LABSPEC 1.016 08/18/2016 2013   PHURINE 7.0 08/18/2016 2013   GLUCOSEU NEGATIVE 08/18/2016 2013   HGBUR NEGATIVE 08/18/2016 2013   [place] NEGATIVE 08/18/2016 2013   KETONESUR 20 (A) 08/18/2016 2013   PROTEINUR NEGATIVE 08/18/2016 2013   UROBILINOGEN 1.0 11/13/2013 1232   NITRITE NEGATIVE 08/18/2016 2013   LEUKOCYTESUR NEGATIVE 08/18/2016 2013    Radiological Exams on Admission: Ct Head Wo Contrast  Result Date: 08/18/2016 CLINICAL DATA:  Altered mental status today. EXAM: CT HEAD WITHOUT CONTRAST TECHNIQUE: Contiguous axial images were obtained from the base of the skull through the vertex without intravenous contrast. COMPARISON:  Head CT dated 02/11/2016. FINDINGS: Brain: Ventricles are normal in size and configuration. There is no mass, hemorrhage, edema or other evidence of acute parenchymal abnormality. No extra-axial hemorrhage. Mild chronic small vessel ischemic changes noted within the white matter. Vascular: There are chronic calcified atherosclerotic changes of the large vessels at the skull base. No  unexpected hyperdense vessel. Skull: Normal. Negative for fracture or focal lesion. Sinuses/Orbits: No acute finding. Other: None. IMPRESSION: No acute findings. No intracranial mass, hemorrhage or edema. Mild chronic small vessel ischemic changes within the white matter. Electronically Signed   By: Franki Cabot M.D.   On: 08/18/2016 19:18    EKG: Independently reviewed by me.  NSR.  No acute ST segment changes.  Assessment/Plan Principal Problem:   Acute encephalopathy Active Problems:   GERD   Acute intractable headache   Obesity      Sepsis with fever and acute encephalopathy.  Reported exposure to tick on Friday.  I think meningitis is less likely but must be considered in the context of fever, headache, and mental status changes (though symptoms have improved remarkably in the ED). --Agree with empiric coverage with IV vanc, rocephin, ampicillin, and doxycycline for now --Consider LP under fluoro with IR in the AM --Blood cultures are pending --Repeat lactic acid level now --Check procalcitonin --Will need to follow-up RMSF, Borrelia, and Ehrlichia antibodies.  Intractable migraine headache --Resolved for now --Monitor --Low threshold for neurology consult if it recurs  HLD --Pravachol  GERD --PPI  HTN --Hold diuretic because clinically she appears dehydrated --Lisinopril, metoprolol   DVT prophylaxis: Lovenox Code Status: FULL Family Communication: Husband did not answer at either number  listed on her facesheet. Disposition Plan: To be determined. Consults called: NONE Admission status: Place in observation, stepdown unit.     TIME SPENT: 75 minutes   Eber Jones MD [facility] Pager [phone]  If 7PM-7AM, please contact night-coverage [email or web address] Password [facility]  08/18/2016, 9:18 PM

## 2016-08-18 NOTE — ED Provider Notes (Signed)
[facility] DEPT Provider Note   CSN: [id number] Arrival date & time: 08/18/16  1718     History   Chief Complaint Chief Complaint  Patient presents with  . Altered Mental Status    HPI [name] is a 76 y.o. female.  The history is provided by the patient and the spouse. No language interpreter was used.  Altered Mental Status     [name] is a 76 y.o. female who presents to the Emergency Department complaining of HA.  Level V caveat due to AMS.  History is provided by the patient's husband. He states that she was in her routine set of health when about 11:00 today she developed a severe headache that is typical for her migraine headaches. She has had speech difficulty associated with this. She went to her neurologist's office and was referred to the emergency department for further evaluation. Symptoms are severe and constant nature. The only thing that the patient is able to complain of his her head hurting. Past Medical History:  Diagnosis Date  . Arthritis    "got alot; especially in my spine"  . Chest pain 02/2016  . Chronic back pain    "all of it"  . Classical migraine with intractable migraine 02/25/2016  . Diverticulosis of colon (without mention of hemorrhage)   . DVT of leg (deep venous thrombosis) ([facility]) 09/2011   LLE  . Dysrhythmia    notes in epic  . Esophageal reflux   . Family history of malignant neoplasm of gastrointestinal tract   . Fibromyalgia    "dx'd w/it when it first came out"  . Hiatal hernia   . Hyperlipidemia   . Hypertension   . Migraines 09/24/11   "often recently;  more controlled now"  . Obesity, unspecified   . Osteoporosis   . Palpitations    event monitor 8/13: NSR, Sinus brady, Sinus Tachy, PAC  . Personal history of colonic polyps 05/31/2007   adenomatous polyp  . Pneumonia    "3 times at least" (09/24/11)  . Rheumatoid arthritis(714.0)   . Scoliosis   . Stricture and stenosis of esophagus   . Unspecified  gastritis and gastroduodenitis without mention of hemorrhage     Patient Active Problem List   Diagnosis Date Noted  . Acute encephalopathy 08/18/2016  . Classical migraine with intractable migraine 02/25/2016  . Chest pain 02/13/2016  . Leg pain 01/28/2013  . Autonomic orthostatic hypotension 11/25/2011  . Nausea 09/25/2011  . Tachycardia, paroxysmal ([place]) 09/24/2011  . DVT (deep venous thrombosis) ([place]) 09/24/2011  . Hyponatremia 09/24/2011  . Hypertension 05/13/2011  . Exertional dyspnea 04/23/2011  . Palpitations 04/23/2011  . Hyperlipidemia 04/23/2011  . Personal history of colonic polyps 02/06/2011  . Family history of malignant neoplasm of gastrointestinal tract 02/06/2011  . Status post partial resection of colon 02/06/2011  . Obesity 02/06/2011  . EXOGENOUS OBESITY 07/01/2007  . GERD 07/01/2007  . Acute intractable headache 07/01/2007  . Hx of adenomatous polyp of colon 06/02/2007  . GASTRITIS 05/31/2007  . HIATAL HERNIA 05/31/2007  . DIVERTICULOSIS, COLON 05/31/2007  . ESOPHAGEAL STRICTURE 02/18/2002    Past Surgical History:  Procedure Laterality Date  . BACK SURGERY    . BREAST CYST INCISION AND DRAINAGE  2012   left; "golf-ball sized"  . BREAST SURGERY     cyst lanced and drainded  . COLON SURGERY    . COLONOSCOPY  2009  . ESOPHAGEAL DILATION     "several times"  . EXPLORATORY  LAPAROTOMY  04/2000   "for ruptured colon"  . HEMATOMA EVACUATION  09/2011   LLE  . LUMBAR LAMINECTOMY/DECOMPRESSION MICRODISCECTOMY  09/03/2011   Procedure: LUMBAR LAMINECTOMY/DECOMPRESSION MICRODISCECTOMY 2 LEVELS;  Surgeon: Elaina Hoops, MD;  Location: [facility];  Service: Neurosurgery;  Laterality: Left;  Left Lumbar Three-Four, Lumbar Four-Five Decompressive Laminectomy  . SALIVARY GLAND SURGERY  ~ 1980   1 removed  . SHOULDER SURGERY  2008   right shoulder and collar bone  . SIGMOID RESECTION / RECTOPEXY  09/1999   Dr Rosana Hoes  . TRIGGER FINGER RELEASE  2004   left thumb  .  TUMOR REMOVAL  1960's   left leg  . UPPER GASTROINTESTINAL ENDOSCOPY      OB History    No data available       Home Medications    Prior to Admission medications   Medication Sig Start Date End Date Taking? Authorizing Provider  SUMAtriptan (IMITREX) 100 MG tablet Take 1 tablet (100 mg total) by mouth 2 (two) times daily as needed for migraine. 08/18/16  Yes Kathrynn Ducking, MD  albuterol (PROVENTIL HFA;VENTOLIN HFA) 108 (90 Base) MCG/ACT inhaler Inhale 2 puffs into the lungs every 6 (six) hours as needed for wheezing or shortness of breath.    [provider]  aspirin EC 81 MG tablet Take 81 mg by mouth 2 (two) times a week.     [provider]  calcium carbonate (TUMS EX) 750 MG chewable tablet Chew 2 tablets by mouth daily as needed for heartburn.     [provider]  Cyanocobalamin (VITAMIN B-12 IJ) Inject as directed every 30 (thirty) days.    [provider]  fluticasone (FLONASE) 50 MCG/ACT nasal spray Place 1 spray into both nostrils daily as needed for allergies. 08/02/16   [provider]  hydrochlorothiazide (HYDRODIURIL) 25 MG tablet Take 1 tablet (25 mg total) by mouth daily. 06/17/16   End, Harrell Gave, MD  lisinopril (PRINIVIL,ZESTRIL) 10 MG tablet Take 1 tablet (10 mg total) by mouth every evening. 06/24/16 09/22/16  End, Harrell Gave, MD  loratadine (CLARITIN) 10 MG tablet Take 10 mg by mouth daily as needed for allergies.    [provider]  metoprolol (LOPRESSOR) 50 MG tablet 1/2 tablet (25 mg) two times a day 06/27/16   End, Harrell Gave, MD  pantoprazole (PROTONIX) 40 MG tablet Take 1 tablet (40 mg total) by mouth 2 (two) times daily before a meal. 04/24/16   Gatha Mayer, MD  polyethylene glycol (MIRALAX / Floria Raveling) packet Take 17 g by mouth daily. Patient taking differently: Take 17 g by mouth daily as needed.  02/15/16   Hollice Gong, Mir Mohammed, MD  pravastatin (PRAVACHOL) 20 MG tablet Take 1 tablet (20 mg total)  by mouth every evening. 01/21/16 04/20/16  Richardson Dopp T, PA-C  pravastatin (PRAVACHOL) 20 MG tablet Take 20 mg by mouth every evening.    [provider]  promethazine (PHENERGAN) 25 MG tablet Take 1 tablet (25 mg total) by mouth every 6 (six) hours as needed for nausea or vomiting. 08/18/16   Kathrynn Ducking, MD    Family History Family History  Problem Relation Age of Onset  . Stroke Mother   . Hypertension Mother   . Stroke Father   . Hypertension Father   . Diabetes Father   . Pancreatic cancer Sister   . Diabetes Sister        x 2  . Colon cancer Sister 77  . Pancreatic cancer  Sister   . Heart attack Neg Hx   . Stomach cancer Neg Hx     Social History Social History  Substance Use Topics  . Smoking status: Never Smoker  . Smokeless tobacco: Never Used  . Alcohol use No     Allergies   Reglan [metoclopramide]   Review of Systems Review of Systems  All other systems reviewed and are negative.    Physical Exam Updated Vital Signs BP (!) 117/50   Pulse 83   Temp (!) 100.7 F (38.2 C) (Rectal)   Resp 20   Ht 5\' 2"  (1.575 m)   Wt 89.8 kg (198 lb)   SpO2 96%   BMI 36.21 kg/m   Physical Exam  Constitutional: She appears well-developed and well-nourished. She appears distressed.  HENT:  Head: Normocephalic and atraumatic.  Eyes: Pupils are equal, round, and reactive to light.  Photophobia  Cardiovascular: Normal rate and regular rhythm.   No murmur heard. Pulmonary/Chest: Effort normal and breath sounds normal. No respiratory distress.  Abdominal: Soft. There is no tenderness. There is no rebound and no guarding.  Musculoskeletal: She exhibits no tenderness.  Nonpitting edema to bilateral lower extremities  Neurological: She is alert.  Does not answer most questions on examination. She moves all extremities symmetrically and strongly but actively refuses examination.  Skin: Skin is warm and dry.  Psychiatric:  Tearful  Nursing note and  vitals reviewed.    ED Treatments / Results  Labs (all labs ordered are listed, but only abnormal results are displayed) Labs Reviewed  CBC - Abnormal; Notable for the following:       Result Value   WBC 11.0 (*)    RBC 5.44 (*)    Hemoglobin 15.3 (*)    All other components within normal limits  DIFFERENTIAL - Abnormal; Notable for the following:    Neutro Abs 9.6 (*)    All other components within normal limits  COMPREHENSIVE METABOLIC PANEL - Abnormal; Notable for the following:    Glucose, Bld 126 (*)    Creatinine, Ser 1.15 (*)    Total Bilirubin 1.6 (*)    GFR calc non Af Amer 45 (*)    GFR calc Af Amer 53 (*)    All other components within normal limits  URINALYSIS, ROUTINE W REFLEX MICROSCOPIC - Abnormal; Notable for the following:    Ketones, ur 20 (*)    All other components within normal limits  I-STAT CHEM 8, ED - Abnormal; Notable for the following:    Creatinine, Ser 1.10 (*)    Glucose, Bld 124 (*)    Calcium, Ion 1.01 (*)    Hemoglobin 15.6 (*)    All other components within normal limits  I-STAT CG4 LACTIC ACID, ED - Abnormal; Notable for the following:    Lactic Acid, Venous 4.70 (*)    All other components within normal limits  I-STAT ARTERIAL BLOOD GAS, ED - Abnormal; Notable for the following:    pH, Arterial 7.457 (*)    pO2, Arterial 77.0 (*)    All other components within normal limits  CULTURE, BLOOD (ROUTINE X 2)  CULTURE, BLOOD (ROUTINE X 2)  ETHANOL  PROTIME-INR  APTT  RAPID URINE DRUG SCREEN, HOSP PERFORMED  BLOOD GAS, ARTERIAL  ROCKY MTN SPOTTED FVR ABS PNL(IGG+IGM)  B. BURGDORFI ANTIBODIES  EHRLICHIA ANTIBODY PANEL  LACTIC ACID, PLASMA  PROCALCITONIN  I-STAT TROPOININ, ED  I-STAT CG4 LACTIC ACID, ED    EKG  EKG Interpretation None  Radiology Ct Head Wo Contrast  Result Date: 08/18/2016 CLINICAL DATA:  Altered mental status today. EXAM: CT HEAD WITHOUT CONTRAST TECHNIQUE: Contiguous axial images were obtained from  the base of the skull through the vertex without intravenous contrast. COMPARISON:  Head CT dated 02/11/2016. FINDINGS: Brain: Ventricles are normal in size and configuration. There is no mass, hemorrhage, edema or other evidence of acute parenchymal abnormality. No extra-axial hemorrhage. Mild chronic small vessel ischemic changes noted within the white matter. Vascular: There are chronic calcified atherosclerotic changes of the large vessels at the skull base. No unexpected hyperdense vessel. Skull: Normal. Negative for fracture or focal lesion. Sinuses/Orbits: No acute finding. Other: None. IMPRESSION: No acute findings. No intracranial mass, hemorrhage or edema. Mild chronic small vessel ischemic changes within the white matter. Electronically Signed   By: Franki Cabot M.D.   On: 08/18/2016 19:18   Dg Chest Port 1 View  Result Date: 08/18/2016 CLINICAL DATA:  Fever and altered mental status EXAM: PORTABLE CHEST 1 VIEW COMPARISON:  02/12/2016 FINDINGS: Right lung is grossly clear. Slightly elevated left diaphragm with minimal left basilar atelectasis. No focal consolidation. There is mild cardiomegaly. Aortic atherosclerosis. No pneumothorax. IMPRESSION: Cardiomegaly with minimal left basilar atelectasis. Electronically Signed   By: Donavan Foil M.D.   On: 08/18/2016 21:58    Procedures Procedures (including critical care time)  Medications Ordered in ED Medications  0.9 %  sodium chloride infusion ( Intravenous New Bag/Given 08/18/16 2205)  acetaminophen (TYLENOL) tablet 650 mg ( Oral See Alternative 08/18/16 2200)    Or  acetaminophen (TYLENOL) solution 650 mg ( Per Tube See Alternative 08/18/16 2200)    Or  acetaminophen (TYLENOL) suppository 650 mg (650 mg Rectal Given 08/18/16 2200)  doxycycline (VIBRAMYCIN) 100 mg in dextrose 5 % 250 mL IVPB (0 mg Intravenous Stopped 08/19/16 0108)  vancomycin (VANCOCIN) IVPB 750 mg/150 ml premix (0 mg Intravenous Stopped 08/19/16 0048)  fentaNYL (SUBLIMAZE)  injection 50 mcg (50 mcg Intravenous Given 08/18/16 1820)  ondansetron (ZOFRAN) injection 4 mg (4 mg Intravenous Given 08/18/16 1820)  sodium chloride 0.9 % bolus 500 mL (0 mLs Intravenous Stopped 08/18/16 2205)  sodium chloride 0.9 % bolus 2,200 mL (0 mLs Intravenous Stopped 08/19/16 0048)  cefTRIAXone (ROCEPHIN) 2 g in dextrose 5 % 50 mL IVPB (0 g Intravenous Stopped 08/18/16 2335)     Initial Impression / Assessment and Plan / ED Course  I have reviewed the triage vital signs and the nursing notes.  Pertinent labs & imaging results that were available during my care of the patient were reviewed by me and considered in my medical decision making (see chart for details).     Patient with history of severe headaches here with recurrent severe headache and altered mental status. Following treatment with pain medications her headache is resolved but she continues to refuse to open her eyes or follow commands, mildly confused. She denies any additional complaints. Presentation is not consistent with subarachnoid hemorrhage, meningitis, CVA. Hospitalist consulted for ongoing treatment.  On repeat assessment in the emergency department patient is more lucid. She is awake and remembers events earlier in the day. She describes what began as a typical migraine headache and now her headache is completely resolved. She is noted to be febrile to 100.7. Given the fever and headache lactate and blood cultures were added onto her studies and she was treated pending further discussions. Discussed the patient performing an lumbar puncture to further rule out meningitis versus cerebral infection. Patient declines at  this time she wants it to be performed under imaging due to her history of prior spinal surgeries.  She also reports on repeat assessment that she was bit by a small tick on Friday and has some redness of that area on her right proximal calf. She is not sure how long the tick was in place. Will send titers  for tickborne illnesses and empirically treat.  Final Clinical Impressions(s) / ED Diagnoses   Final diagnoses:  None    New Prescriptions New Prescriptions   No medications on file     Quintella Reichert, MD 08/19/16 0120

## 2016-08-18 NOTE — Telephone Encounter (Signed)
I will refill the Imitrex.

## 2016-08-18 NOTE — Telephone Encounter (Signed)
A prescription for Phenergan was called in as well.

## 2016-08-18 NOTE — Telephone Encounter (Signed)
I called the emergency room. The patient has been in our office for several hours getting IV treatment. So far the medications have not helped.  The patient has gotten 1 g of Depacon rapid infusion, she has gotten 30 mg of IV Toradol, 25 mg of IV Phenergan, she has gotten 500 mL of normal saline.  Blood pressures have been greater than 383 systolic, the patient continues to have severe headache, severe nausea and vomiting.  The headache started this morning around 9 AM with her typical scotoma in the visual field going into a migraine headache. She took 2 Imitrex tablets earlier, these were the 25 mg tablets. The medication did not help.  The patient may require therapy in the emergency room for the headache. It is possible she may require an admission.  The patient was complaining of tingling sensations in both hands along with the nausea.  If the headache does not abate, CT brain scan evaluation may be warranted.

## 2016-08-18 NOTE — Telephone Encounter (Signed)
Patients husband called office back to see if patient is able to be seen this afternoon due to migraine.  Patient requesting to be seen today if possible.  Please call.

## 2016-08-18 NOTE — Telephone Encounter (Signed)
All questions about procedures on 08/20/16 answered.  She will call back for any additional questions or concerns.

## 2016-08-19 ENCOUNTER — Observation Stay (HOSPITAL_COMMUNITY): Payer: Medicare Other

## 2016-08-19 ENCOUNTER — Other Ambulatory Visit: Payer: Self-pay | Admitting: Internal Medicine

## 2016-08-19 ENCOUNTER — Telehealth: Payer: Self-pay | Admitting: Internal Medicine

## 2016-08-19 DIAGNOSIS — M797 Fibromyalgia: Secondary | ICD-10-CM | POA: Diagnosis present

## 2016-08-19 DIAGNOSIS — G43819 Other migraine, intractable, without status migrainosus: Secondary | ICD-10-CM | POA: Diagnosis not present

## 2016-08-19 DIAGNOSIS — G9340 Encephalopathy, unspecified: Secondary | ICD-10-CM | POA: Diagnosis not present

## 2016-08-19 DIAGNOSIS — T50905A Adverse effect of unspecified drugs, medicaments and biological substances, initial encounter: Secondary | ICD-10-CM | POA: Diagnosis present

## 2016-08-19 DIAGNOSIS — Z8249 Family history of ischemic heart disease and other diseases of the circulatory system: Secondary | ICD-10-CM | POA: Diagnosis not present

## 2016-08-19 DIAGNOSIS — I119 Hypertensive heart disease without heart failure: Secondary | ICD-10-CM | POA: Diagnosis present

## 2016-08-19 DIAGNOSIS — K219 Gastro-esophageal reflux disease without esophagitis: Secondary | ICD-10-CM

## 2016-08-19 DIAGNOSIS — G44201 Tension-type headache, unspecified, intractable: Secondary | ICD-10-CM | POA: Diagnosis not present

## 2016-08-19 DIAGNOSIS — R51 Headache: Secondary | ICD-10-CM | POA: Diagnosis not present

## 2016-08-19 DIAGNOSIS — I10 Essential (primary) hypertension: Secondary | ICD-10-CM | POA: Diagnosis not present

## 2016-08-19 DIAGNOSIS — K449 Diaphragmatic hernia without obstruction or gangrene: Secondary | ICD-10-CM | POA: Diagnosis present

## 2016-08-19 DIAGNOSIS — G43119 Migraine with aura, intractable, without status migrainosus: Secondary | ICD-10-CM | POA: Diagnosis present

## 2016-08-19 DIAGNOSIS — M1990 Unspecified osteoarthritis, unspecified site: Secondary | ICD-10-CM | POA: Diagnosis present

## 2016-08-19 DIAGNOSIS — G92 Toxic encephalopathy: Secondary | ICD-10-CM | POA: Diagnosis present

## 2016-08-19 DIAGNOSIS — E669 Obesity, unspecified: Secondary | ICD-10-CM | POA: Diagnosis present

## 2016-08-19 DIAGNOSIS — G8929 Other chronic pain: Secondary | ICD-10-CM | POA: Diagnosis present

## 2016-08-19 DIAGNOSIS — M069 Rheumatoid arthritis, unspecified: Secondary | ICD-10-CM | POA: Diagnosis present

## 2016-08-19 DIAGNOSIS — Z6836 Body mass index (BMI) 36.0-36.9, adult: Secondary | ICD-10-CM | POA: Diagnosis not present

## 2016-08-19 DIAGNOSIS — A419 Sepsis, unspecified organism: Secondary | ICD-10-CM | POA: Diagnosis present

## 2016-08-19 DIAGNOSIS — E860 Dehydration: Secondary | ICD-10-CM | POA: Diagnosis present

## 2016-08-19 DIAGNOSIS — W57XXXA Bitten or stung by nonvenomous insect and other nonvenomous arthropods, initial encounter: Secondary | ICD-10-CM | POA: Diagnosis present

## 2016-08-19 DIAGNOSIS — I672 Cerebral atherosclerosis: Secondary | ICD-10-CM | POA: Diagnosis present

## 2016-08-19 DIAGNOSIS — Z823 Family history of stroke: Secondary | ICD-10-CM | POA: Diagnosis not present

## 2016-08-19 DIAGNOSIS — R7881 Bacteremia: Secondary | ICD-10-CM | POA: Diagnosis not present

## 2016-08-19 DIAGNOSIS — Z8701 Personal history of pneumonia (recurrent): Secondary | ICD-10-CM | POA: Diagnosis not present

## 2016-08-19 DIAGNOSIS — M419 Scoliosis, unspecified: Secondary | ICD-10-CM | POA: Diagnosis present

## 2016-08-19 DIAGNOSIS — E785 Hyperlipidemia, unspecified: Secondary | ICD-10-CM | POA: Diagnosis present

## 2016-08-19 DIAGNOSIS — E872 Acidosis: Secondary | ICD-10-CM | POA: Diagnosis present

## 2016-08-19 DIAGNOSIS — Z7951 Long term (current) use of inhaled steroids: Secondary | ICD-10-CM | POA: Diagnosis not present

## 2016-08-19 DIAGNOSIS — Z86718 Personal history of other venous thrombosis and embolism: Secondary | ICD-10-CM | POA: Diagnosis not present

## 2016-08-19 DIAGNOSIS — Z800 Family history of malignant neoplasm of digestive organs: Secondary | ICD-10-CM | POA: Diagnosis not present

## 2016-08-19 DIAGNOSIS — Z7982 Long term (current) use of aspirin: Secondary | ICD-10-CM | POA: Diagnosis not present

## 2016-08-19 LAB — I-STAT CG4 LACTIC ACID, ED
LACTIC ACID, VENOUS: 2.74 mmol/L — AB (ref 0.5–1.9)
Lactic Acid, Venous: 1.05 mmol/L (ref 0.5–1.9)

## 2016-08-19 LAB — BLOOD CULTURE ID PANEL (REFLEXED)
Acinetobacter baumannii: NOT DETECTED
CANDIDA GLABRATA: NOT DETECTED
CANDIDA KRUSEI: NOT DETECTED
Candida albicans: NOT DETECTED
Candida parapsilosis: NOT DETECTED
Candida tropicalis: NOT DETECTED
ENTEROCOCCUS SPECIES: NOT DETECTED
ESCHERICHIA COLI: NOT DETECTED
Enterobacter cloacae complex: NOT DETECTED
Enterobacteriaceae species: NOT DETECTED
Haemophilus influenzae: NOT DETECTED
Klebsiella oxytoca: NOT DETECTED
Klebsiella pneumoniae: NOT DETECTED
LISTERIA MONOCYTOGENES: NOT DETECTED
Methicillin resistance: NOT DETECTED
NEISSERIA MENINGITIDIS: NOT DETECTED
PROTEUS SPECIES: NOT DETECTED
Pseudomonas aeruginosa: NOT DETECTED
STREPTOCOCCUS AGALACTIAE: NOT DETECTED
STREPTOCOCCUS SPECIES: NOT DETECTED
Serratia marcescens: NOT DETECTED
Staphylococcus aureus (BCID): NOT DETECTED
Staphylococcus species: DETECTED — AB
Streptococcus pneumoniae: NOT DETECTED
Streptococcus pyogenes: NOT DETECTED

## 2016-08-19 LAB — CSF CELL COUNT WITH DIFFERENTIAL
RBC COUNT CSF: 24 /mm3 — AB
Tube #: 1
WBC CSF: 1 /mm3 (ref 0–5)

## 2016-08-19 LAB — LACTIC ACID, PLASMA: Lactic Acid, Venous: 2.2 mmol/L (ref 0.5–1.9)

## 2016-08-19 LAB — PROCALCITONIN: Procalcitonin: <0.1 ng/mL

## 2016-08-19 LAB — GLUCOSE, CSF: Glucose, CSF: 64 mg/dL (ref 40–70)

## 2016-08-19 LAB — PROTEIN, CSF: Total  Protein, CSF: 66 mg/dL — ABNORMAL HIGH (ref 15–45)

## 2016-08-19 MED ORDER — PANTOPRAZOLE SODIUM 40 MG PO TBEC
40.0000 mg | DELAYED_RELEASE_TABLET | Freq: Two times a day (BID) | ORAL | Status: DC
  Administered 2016-08-19 – 2016-08-21 (×5): 40 mg via ORAL
  Filled 2016-08-19 (×5): qty 1

## 2016-08-19 MED ORDER — SODIUM CHLORIDE 0.9 % IV SOLN
2.0000 g | Freq: Once | INTRAVENOUS | Status: AC
  Administered 2016-08-19: 2 g via INTRAVENOUS
  Filled 2016-08-19: qty 2000

## 2016-08-19 MED ORDER — DEXTROSE 5 % IV SOLN
2.0000 g | Freq: Two times a day (BID) | INTRAVENOUS | Status: DC
  Administered 2016-08-19 – 2016-08-20 (×3): 2 g via INTRAVENOUS
  Filled 2016-08-19 (×4): qty 2

## 2016-08-19 MED ORDER — ENOXAPARIN SODIUM 40 MG/0.4ML ~~LOC~~ SOLN: 40.0000 mg | SUBCUTANEOUS | Status: DC

## 2016-08-19 MED ORDER — PRAVASTATIN SODIUM 20 MG PO TABS
20.0000 mg | ORAL_TABLET | Freq: Every evening | ORAL | Status: DC
  Administered 2016-08-19 – 2016-08-21 (×3): 20 mg via ORAL
  Filled 2016-08-19 (×3): qty 1

## 2016-08-19 MED ORDER — ENOXAPARIN SODIUM 40 MG/0.4ML ~~LOC~~ SOLN
40.0000 mg | SUBCUTANEOUS | Status: DC
  Administered 2016-08-19: 40 mg via SUBCUTANEOUS
  Filled 2016-08-19: qty 0.4

## 2016-08-19 MED ORDER — LIDOCAINE HCL (PF) 1 % IJ SOLN
5.0000 mL | Freq: Once | INTRAMUSCULAR | Status: AC
  Administered 2016-08-19: 5 mL via INTRADERMAL

## 2016-08-19 MED ORDER — ASPIRIN EC 81 MG PO TBEC
81.0000 mg | DELAYED_RELEASE_TABLET | ORAL | Status: DC
  Administered 2016-08-21: 81 mg via ORAL
  Filled 2016-08-19: qty 1

## 2016-08-19 MED ORDER — SODIUM CHLORIDE 0.9 % IV BOLUS (SEPSIS)
500.0000 mL | Freq: Once | INTRAVENOUS | Status: AC
  Administered 2016-08-19: 500 mL via INTRAVENOUS

## 2016-08-19 MED ORDER — LISINOPRIL 10 MG PO TABS: 10.0000 mg | ORAL_TABLET | Freq: Every evening | ORAL | Status: DC

## 2016-08-19 MED ORDER — METOPROLOL TARTRATE 25 MG PO TABS
25.0000 mg | ORAL_TABLET | Freq: Two times a day (BID) | ORAL | Status: DC
  Administered 2016-08-19 – 2016-08-21 (×3): 25 mg via ORAL
  Filled 2016-08-19 (×5): qty 1

## 2016-08-19 MED ORDER — SODIUM CHLORIDE 0.9 % IV SOLN
2.0000 g | Freq: Four times a day (QID) | INTRAVENOUS | Status: DC
  Administered 2016-08-19 – 2016-08-20 (×6): 2 g via INTRAVENOUS
  Filled 2016-08-19 (×7): qty 2000

## 2016-08-19 NOTE — Progress Notes (Addendum)
[facility] PROGRESS NOTE  [name] QIH:[id number] DOB: 15-Jan-1941 DOA: 08/18/2016   PCP: [name], MD     Assessment/Plan: Principal Problem:   Acute encephalopathy Active Problems:   GERD   Acute intractable headache   Obesity   Sepsis ([place])   76 y.o. woman with a history of RA, severe scoliosis, chronic back pain, lumbar laminectomy, GERD with esophageal stricture, DVT in 2013, HTN, HLD, and migraine headaches with aura who presented to Dr. Jannifer Franklin' office 6/11 for  management of intractable headache, presentation consistent with prior episodes of migraine headaches for her.  Headache was refractory to two imitrex tablets at home.  In the office, she received 1 gram of depacon, 30mg  of IV toradol, 25mg  of IV phenergan. Subsequently she spiked a fever in the ER.:Code  Sepsis activated. Patient started on broad-spectrum antibiotics for suspected meningitis  Assessment and plan Acute encephalopathy/fever Patient started on broad-spectrum antibiotics namely   IV vanc, rocephin, ampicillin, and doxycycline for now  Ordered LP under fluoro with IR , as patient declined bedside LP in the ER-labs ordered for CSF analysis  Blood cultures ,CSF culture are pending Lactic acidosis improving  Pro calcitonin less than 0.10  Follow RMSF, Borrelia, and Ehrlichia antibodies. UA, chest x-ray negative Dc abx if all cx data negative and BP has improved    Intractable migraine headache --Resolved for now -- CT head without any acute findings or intracranial mass -- Consulted neurology, Dr Max Fickle   Hyperlipidemia --Pravachol  GERD --PPI  HTN --Hold diuretic because clinically she appears dehydrated -hold Lisinopril as BP in the 90's, continue metoprolol     DVT prophylaxsis Lovenox on hold for LP  Code Status:  Full code     Family Communication: Discussed in detail with the patient, all imaging results, lab results explained to the patient    Disposition Plan:  1-2 days if work up  Negative      Consultants:  Neurology  Procedures:  None  Antibiotics: Anti-infectives    Start     Dose/Rate Route Frequency Ordered Stop   08/19/16 1000  cefTRIAXone (ROCEPHIN) 2 g in dextrose 5 % 50 mL IVPB     2 g 100 mL/hr over 30 Minutes Intravenous Every 12 hours 08/19/16 0136     08/19/16 0800  ampicillin (OMNIPEN) 2 g in sodium chloride 0.9 % 50 mL IVPB     2 g 150 mL/hr over 20 Minutes Intravenous Every 6 hours 08/19/16 0136     08/19/16 0130  ampicillin (OMNIPEN) 2 g in sodium chloride 0.9 % 50 mL IVPB     2 g 150 mL/hr over 20 Minutes Intravenous  Once 08/19/16 0123 08/19/16 0302   08/18/16 2300  doxycycline (VIBRAMYCIN) 100 mg in dextrose 5 % 250 mL IVPB     100 mg 125 mL/hr over 120 Minutes Intravenous Every 12 hours 08/18/16 2231     08/18/16 2300  vancomycin (VANCOCIN) IVPB 750 mg/150 ml premix     750 mg 150 mL/hr over 60 Minutes Intravenous Every 12 hours 08/18/16 2242     08/18/16 2230  cefTRIAXone (ROCEPHIN) 2 g in dextrose 5 % 50 mL IVPB     2 g 100 mL/hr over 30 Minutes Intravenous  Once 08/18/16 2221 08/18/16 2335         HPI/Subjective: Still feels "whoozy" ,occipital HA   Objective: Vitals:   08/19/16 0500 08/19/16 0511 08/19/16 0526 08/19/16 0800  BP: 133/62  114/75 94/65  Pulse: 79  80 74  Resp: 19  19   Temp:  97.9 F (36.6 C) 98.5 F (36.9 C) (!) 96.5 F (35.8 C)  TempSrc:  Oral Oral Oral  SpO2: 98%  99%   Weight:      Height:        Intake/Output Summary (Last 24 hours) at 08/19/16 0849 Last data filed at 08/19/16 0434  Gross per 24 hour  Intake             3700 ml  Output                0 ml  Net             3700 ml    Exam:  Examination:  General exam: Appears calm and comfortable  Respiratory system: Clear to auscultation. Respiratory effort normal. Cardiovascular system: S1 & S2 heard, RRR. No JVD, murmurs, rubs, gallops or clicks. No pedal edema. Gastrointestinal  system: Abdomen is nondistended, soft and nontender. No organomegaly or masses felt. Normal bowel sounds heard. Central nervous system: Alert and oriented. No focal neurological deficits. Extremities: Symmetric 5 x 5 power. Skin: No rashes, lesions or ulcers Psychiatry: Judgement and insight appear normal. Mood & affect appropriate.     Data Reviewed: I have personally reviewed following labs and imaging studies  Micro Results No results found for this or any previous visit (from the past 240 hour(s)).  Radiology Reports Ct Head Wo Contrast  Result Date: 08/18/2016 CLINICAL DATA:  Altered mental status today. EXAM: CT HEAD WITHOUT CONTRAST TECHNIQUE: Contiguous axial images were obtained from the base of the skull through the vertex without intravenous contrast. COMPARISON:  Head CT dated 02/11/2016. FINDINGS: Brain: Ventricles are normal in size and configuration. There is no mass, hemorrhage, edema or other evidence of acute parenchymal abnormality. No extra-axial hemorrhage. Mild chronic small vessel ischemic changes noted within the white matter. Vascular: There are chronic calcified atherosclerotic changes of the large vessels at the skull base. No unexpected hyperdense vessel. Skull: Normal. Negative for fracture or focal lesion. Sinuses/Orbits: No acute finding. Other: None. IMPRESSION: No acute findings. No intracranial mass, hemorrhage or edema. Mild chronic small vessel ischemic changes within the white matter. Electronically Signed   By: Franki Cabot M.D.   On: 08/18/2016 19:18   Dg Chest Port 1 View  Result Date: 08/18/2016 CLINICAL DATA:  Fever and altered mental status EXAM: PORTABLE CHEST 1 VIEW COMPARISON:  02/12/2016 FINDINGS: Right lung is grossly clear. Slightly elevated left diaphragm with minimal left basilar atelectasis. No focal consolidation. There is mild cardiomegaly. Aortic atherosclerosis. No pneumothorax. IMPRESSION: Cardiomegaly with minimal left basilar  atelectasis. Electronically Signed   By: Donavan Foil M.D.   On: 08/18/2016 21:58     CBC  Recent Labs Lab 08/18/16 1903 08/18/16 1904  WBC  --  11.0*  HGB 15.6* 15.3*  HCT 46.0 45.5  PLT  --  233  MCV  --  83.6  MCH  --  28.1  MCHC  --  33.6  RDW  --  13.2  LYMPHSABS  --  1.1  MONOABS  --  0.3  EOSABS  --  0.0  BASOSABS  --  0.0    Chemistries   Recent Labs Lab 08/18/16 1903 08/18/16 1904  NA 137 136  K 4.1 4.1  CL 101 101  CO2  --  23  GLUCOSE 124* 126*  BUN 19 16  CREATININE 1.10* 1.15*  CALCIUM  --  8.9  AST  --  28  ALT  --  16  ALKPHOS  --  54  BILITOT  --  1.6*   ------------------------------------------------------------------------------------------------------------------ estimated creatinine clearance is 44 mL/min (A) (by C-G formula based on SCr of 1.15 mg/dL (H)). ------------------------------------------------------------------------------------------------------------------ No results for input(s): HGBA1C in the last 72 hours. ------------------------------------------------------------------------------------------------------------------ No results for input(s): CHOL, HDL, LDLCALC, TRIG, CHOLHDL, LDLDIRECT in the last 72 hours. ------------------------------------------------------------------------------------------------------------------ No results for input(s): TSH, T4TOTAL, T3FREE, THYROIDAB in the last 72 hours.  Invalid input(s): FREET3 ------------------------------------------------------------------------------------------------------------------ No results for input(s): VITAMINB12, FOLATE, FERRITIN, TIBC, IRON, RETICCTPCT in the last 72 hours.  Coagulation profile  Recent Labs Lab 08/18/16 1904  INR 0.94    No results for input(s): DDIMER in the last 72 hours.  Cardiac Enzymes No results for input(s): CKMB, TROPONINI, MYOGLOBIN in the last 168 hours.  Invalid input(s):  CK ------------------------------------------------------------------------------------------------------------------ Invalid input(s): POCBNP   CBG: No results for input(s): GLUCAP in the last 168 hours.     Studies: Ct Head Wo Contrast  Result Date: 08/18/2016 CLINICAL DATA:  Altered mental status today. EXAM: CT HEAD WITHOUT CONTRAST TECHNIQUE: Contiguous axial images were obtained from the base of the skull through the vertex without intravenous contrast. COMPARISON:  Head CT dated 02/11/2016. FINDINGS: Brain: Ventricles are normal in size and configuration. There is no mass, hemorrhage, edema or other evidence of acute parenchymal abnormality. No extra-axial hemorrhage. Mild chronic small vessel ischemic changes noted within the white matter. Vascular: There are chronic calcified atherosclerotic changes of the large vessels at the skull base. No unexpected hyperdense vessel. Skull: Normal. Negative for fracture or focal lesion. Sinuses/Orbits: No acute finding. Other: None. IMPRESSION: No acute findings. No intracranial mass, hemorrhage or edema. Mild chronic small vessel ischemic changes within the white matter. Electronically Signed   By: Franki Cabot M.D.   On: 08/18/2016 19:18   Dg Chest Port 1 View  Result Date: 08/18/2016 CLINICAL DATA:  Fever and altered mental status EXAM: PORTABLE CHEST 1 VIEW COMPARISON:  02/12/2016 FINDINGS: Right lung is grossly clear. Slightly elevated left diaphragm with minimal left basilar atelectasis. No focal consolidation. There is mild cardiomegaly. Aortic atherosclerosis. No pneumothorax. IMPRESSION: Cardiomegaly with minimal left basilar atelectasis. Electronically Signed   By: Donavan Foil M.D.   On: 08/18/2016 21:58      No results found for: HGBA1C Lab Results  Component Value Date   LDLCALC 92 04/25/2016   CREATININE 1.15 (H) 08/18/2016       Scheduled Meds: . [START ON 08/21/2016] aspirin EC  81 mg Oral Once per day on Mon Thu  .  lisinopril  10 mg Oral QPM  . metoprolol tartrate  25 mg Oral BID  . pantoprazole  40 mg Oral BID AC  . pravastatin  20 mg Oral QPM   Continuous Infusions: . sodium chloride 125 mL/hr at 08/18/16 2205  . ampicillin (OMNIPEN) IV 2 g (08/19/16 0829)  . cefTRIAXone (ROCEPHIN)  IV    . doxycycline (VIBRAMYCIN) IV Stopped (08/19/16 0108)  . vancomycin Stopped (08/19/16 0048)     LOS: 0 days    Time spent: >30 MINS    Reyne Dumas  [facility] Pager [phone]. If 7PM-7AM, please contact night-coverage at [email or web address], password [facility] 08/19/2016, 8:49 AM  LOS: 0 days

## 2016-08-19 NOTE — Telephone Encounter (Signed)
[name]  Patient is admitted to [facility].  I will cancel [facility] procedure. Admitted for encephalopathy

## 2016-08-19 NOTE — Evaluation (Signed)
Clinical/Bedside Swallow Evaluation Patient Details  Name: [name] MRN: [id number] Date of Birth: Oct 17, 1940  Today's Date: 08/19/2016 Time: SLP Start Time (ACUTE ONLY): 9678 SLP Stop Time (ACUTE ONLY): 0919 SLP Time Calculation (min) (ACUTE ONLY): 14 min  Past Medical History:  Past Medical History:  Diagnosis Date  . Arthritis    "got alot; especially in my spine"  . Chest pain 02/2016  . Chronic back pain    "all of it"  . Classical migraine with intractable migraine 02/25/2016  . Diverticulosis of colon (without mention of hemorrhage)   . DVT of leg (deep venous thrombosis) ([place]) 09/2011   LLE  . Dysrhythmia    notes in epic  . Esophageal reflux   . Family history of malignant neoplasm of gastrointestinal tract   . Fibromyalgia    "dx'd w/it when it first came out"  . Hiatal hernia   . Hyperlipidemia   . Hypertension   . Migraines 09/24/11   "often recently;  more controlled now"  . Obesity, unspecified   . Osteoporosis   . Palpitations    event monitor 8/13: NSR, Sinus brady, Sinus Tachy, PAC  . Personal history of colonic polyps 05/31/2007   adenomatous polyp  . Pneumonia    "3 times at least" (09/24/11)  . Rheumatoid arthritis(714.0)   . Scoliosis   . Stricture and stenosis of esophagus   . Unspecified gastritis and gastroduodenitis without mention of hemorrhage    Past Surgical History:  Past Surgical History:  Procedure Laterality Date  . BACK SURGERY    . BREAST CYST INCISION AND DRAINAGE  2012   left; "golf-ball sized"  . BREAST SURGERY     cyst lanced and drainded  . COLON SURGERY    . COLONOSCOPY  2009  . ESOPHAGEAL DILATION     "several times"  . EXPLORATORY LAPAROTOMY  04/2000   "for ruptured colon"  . HEMATOMA EVACUATION  09/2011   LLE  . LUMBAR LAMINECTOMY/DECOMPRESSION MICRODISCECTOMY  09/03/2011   Procedure: LUMBAR LAMINECTOMY/DECOMPRESSION MICRODISCECTOMY 2 LEVELS;  Surgeon: Elaina Hoops, MD;  Location: [place] [facility];  Service:  Neurosurgery;  Laterality: Left;  Left Lumbar Three-Four, Lumbar Four-Five Decompressive Laminectomy  . SALIVARY GLAND SURGERY  ~ 1980   1 removed  . SHOULDER SURGERY  2008   right shoulder and collar bone  . SIGMOID RESECTION / RECTOPEXY  09/1999   Dr Rosana Hoes  . TRIGGER FINGER RELEASE  2004   left thumb  . TUMOR REMOVAL  1960's   left leg  . UPPER GASTROINTESTINAL ENDOSCOPY     HPI:  Pt is a 76 y.o.woman with a history of RA, severe scoliosis, chronic back pain, lumbar laminectomy, GERD with esophageal stricture, DVT in 2013, HTN, HLD, and migraine headaches with aura who presented from her neurologist's office due to intractable headache and AMS, found to be septic. CT Head negative for acute findings.    Assessment / Plan / Recommendation Clinical Impression  Pt has no overt signs of dysphagia or aspiration, and is without subjective complaints. She does not have her upper dentures at the moment, so softer food options were reviewed in her menu. Recommend unrestricted solid and liquid textures per pt preference. SLP to sign off. SLP Visit Diagnosis: Dysphagia, unspecified (R13.10)    Aspiration Risk  No limitations    Diet Recommendation Regular;Thin liquid   Liquid Administration via: Cup;Straw Medication Administration: Whole meds with liquid Supervision: Patient able to self feed Compensations: Slow rate;Small sips/bites Postural Changes:  Seated upright at 90 degrees    Other  Recommendations Oral Care Recommendations: Oral care BID   Follow up Recommendations None      Frequency and Duration            Prognosis        Swallow Study   General HPI: Pt is a 76 y.o.woman with a history of RA, severe scoliosis, chronic back pain, lumbar laminectomy, GERD with esophageal stricture, DVT in 2013, HTN, HLD, and migraine headaches with aura who presented from her neurologist's office due to intractable headache and AMS, found to be septic. CT Head negative for acute  findings.  Type of Study: Bedside Swallow Evaluation Previous Swallow Assessment: none in chart Diet Prior to this Study: NPO Temperature Spikes Noted: Yes Respiratory Status: Room air History of Recent Intubation: No Behavior/Cognition: Alert;Cooperative;Pleasant mood Oral Care Completed by SLP: No Oral Cavity - Dentition: Dentures, not available;Other (Comment) (upper dentures at home, has bottom dentition) Vision: Functional for self-feeding Self-Feeding Abilities: Able to feed self Patient Positioning: Upright in bed Baseline Vocal Quality: Normal    Oral/Motor/Sensory Function     Ice Chips Ice chips: Not tested   Thin Liquid Thin Liquid: Within functional limits Presentation: Self Fed;Straw    Nectar Thick Nectar Thick Liquid: Not tested   Honey Thick Honey Thick Liquid: Not tested   Puree Puree: Not tested   Solid   GO   Solid: Within functional limits Presentation: Self Fed    Functional Assessment Tool Used: skilled clinical judgment Functional Limitations: Swallowing Swallow Current Status (Z7356): 0 percent impaired, limited or restricted Swallow Goal Status (P0141): 0 percent impaired, limited or restricted Swallow Discharge Status (C3013): 0 percent impaired, limited or restricted   [name] 08/19/2016,9:38 AM  [name], M.A. CCC-SLP [phone]

## 2016-08-19 NOTE — Telephone Encounter (Signed)
Patient grandson calling to cancel patient procedure for tomorrow 6.13.18 at [facility] due to patient already being in the hospital.

## 2016-08-19 NOTE — Progress Notes (Signed)
Patient returned from LP-she states that she was told to lay flat on her back for about 4-6hours. Patient denies pain at this time will continue to monitor.

## 2016-08-19 NOTE — Progress Notes (Signed)
Arrived from [facility] at 0530. Alert and oriented. Oriented to room. Call light within reach.

## 2016-08-19 NOTE — Care Management Note (Signed)
Case Management Note  Patient Details  Name: SERAFINA TOPHAM MRN: [id number] Date of Birth: 1941/02/17  Subjective/Objective:   Pt in with acute encephalopathy. She is from home.                  Action/Plan: Plan is to return home when medically ready. CM following for d/c needs, physician orders.   Expected Discharge Date:                  Expected Discharge Plan:     In-House Referral:     Discharge planning Services     Post Acute Care Choice:    Choice offered to:     DME Arranged:    DME Agency:     HH Arranged:    HH Agency:     Status of Service:  In process, will continue to follow  If discussed at Long Length of Stay Meetings, dates discussed:    Additional Comments:  Pollie Friar, RN 08/19/2016, 12:05 PM

## 2016-08-19 NOTE — Consult Note (Signed)
NEURO HOSPITALIST CONSULT NOTE   Requestig physician: Dr. Allyson Sabal  Reason for Consult: Possible meningitis  History obtained from:  Patient and Chart     HPI:                                                                                                                                          [name] is an 76 y.o. female with a history of migraine headaches who presented at her Neurologist's office on Monday for management of an intractable headache. Her presentation was felt to beconsistent with her prior episodes of migraine. She had taken two Imitrex tablets at home without improvement of her headache. Several abortive medications were attempted at the Neurologist's office, including  1000 mg Depacon, 30mg  IV toradol, 25mg  IV phenergan, and 500cc of NS. Her headache did not resolve with any of the interventions. She was sent to the ED for further evaluation and management. In the ED, 50 mcg of IV fentanyl and 4mg  of IV Zofran were administered, with resolution of her headache. However, she demonstrated increased somnolence and lethargy, concerning for a possible meningitis given her headache.  In the ED she had a mild leukocytosis of 11. Head CT revealed no acute findings; mild chronic small vessel ischemic changes within the white matter were noted.  The initial plan was to place the patient in observation with neurochecks, hydration, and repeat assessment in the AM. Her mental status was attributed to polypharmacy.  Per hospitalist note, the patient's mentation then improved, however, she then developed a fever to 100.7 (rectal temperature). Per hospitalist note, "The patient was able to give a history of tick bite to her right leg on Friday while working outside in her yard.  She pulled the tick off, wrapped it up, and put it in her purse.  She had not had any fever or sweats before tonight.  She had one episode of chills that she did not think much of.  She had  mild nausea today which she attributed to her migraine headache.  No rash. No diarrhea.  No chest pain.  No shortness of breath.  No swelling.  She has congestion which she attributes to allergies." Additionally, "LP was offered in the ED and discussed with the patient by me, as well as the ED attending.  Risks vs benefits of the procedure were reviewed at length.  Patient's questions were answered to her apparent satisfaction.  She ultimately declined bedside LP in the ED; she would rather wait until the morning to have the procedure done under fluoro."  Ampicillin was then added to her antibiotic regimen for possible meningitis.   Her PMHx includes RA, severe scoliosis, chronic back pain, lumbar laminectomy, GERD with esophageal stricture, DVT in 2013, HTN and HLD.  Past Medical History:  Diagnosis Date  . Arthritis    "got alot; especially in my spine"  . Chest pain 02/2016  . Chronic back pain    "all of it"  . Classical migraine with intractable migraine 02/25/2016  . Diverticulosis of colon (without mention of hemorrhage)   . DVT of leg (deep venous thrombosis) ([place]) 09/2011   LLE  . Dysrhythmia    notes in epic  . Esophageal reflux   . Family history of malignant neoplasm of gastrointestinal tract   . Fibromyalgia    "dx'd w/it when it first came out"  . Hiatal hernia   . Hyperlipidemia   . Hypertension   . Migraines 09/24/11   "often recently;  more controlled now"  . Obesity, unspecified   . Osteoporosis   . Palpitations    event monitor 8/13: NSR, Sinus brady, Sinus Tachy, PAC  . Personal history of colonic polyps 05/31/2007   adenomatous polyp  . Pneumonia    "3 times at least" (09/24/11)  . Rheumatoid arthritis(714.0)   . Scoliosis   . Stricture and stenosis of esophagus   . Unspecified gastritis and gastroduodenitis without mention of hemorrhage     Past Surgical History:  Procedure Laterality Date  . BACK SURGERY    . BREAST CYST INCISION AND DRAINAGE  2012    left; "golf-ball sized"  . BREAST SURGERY     cyst lanced and drainded  . COLON SURGERY    . COLONOSCOPY  2009  . ESOPHAGEAL DILATION     "several times"  . EXPLORATORY LAPAROTOMY  04/2000   "for ruptured colon"  . HEMATOMA EVACUATION  09/2011   LLE  . LUMBAR LAMINECTOMY/DECOMPRESSION MICRODISCECTOMY  09/03/2011   Procedure: LUMBAR LAMINECTOMY/DECOMPRESSION MICRODISCECTOMY 2 LEVELS;  Surgeon: Elaina Hoops, MD;  Location: [facility];  Service: Neurosurgery;  Laterality: Left;  Left Lumbar Three-Four, Lumbar Four-Five Decompressive Laminectomy  . SALIVARY GLAND SURGERY  ~ 1980   1 removed  . SHOULDER SURGERY  2008   right shoulder and collar bone  . SIGMOID RESECTION / RECTOPEXY  09/1999   Dr Rosana Hoes  . TRIGGER FINGER RELEASE  2004   left thumb  . TUMOR REMOVAL  1960's   left leg  . UPPER GASTROINTESTINAL ENDOSCOPY      Family History  Problem Relation Age of Onset  . Stroke Mother   . Hypertension Mother   . Stroke Father   . Hypertension Father   . Diabetes Father   . Pancreatic cancer Sister   . Diabetes Sister        x 2  . Colon cancer Sister 18  . Pancreatic cancer Sister   . Heart attack Neg Hx   . Stomach cancer Neg Hx    Social History:  reports that she has never smoked. She has never used smokeless tobacco. She reports that she does not drink alcohol or use drugs.  Allergies  Allergen Reactions  . Reglan [Metoclopramide] Other (See Comments)    Speech is garbled, extreme trouble making words    MEDICATIONS:  Prior to Admission:  Prescriptions Prior to Admission  Medication Sig Dispense Refill Last Dose  . SUMAtriptan (IMITREX) 100 MG tablet Take 1 tablet (100 mg total) by mouth 2 (two) times daily as needed for migraine. 10 tablet 5 08/18/2016 at Unknown time  . albuterol (PROVENTIL HFA;VENTOLIN HFA) 108 (90 Base) MCG/ACT inhaler Inhale 2 puffs  into the lungs every 6 (six) hours as needed for wheezing or shortness of breath.     [name] aspirin EC 81 MG tablet Take 81 mg by mouth 2 (two) times a week.    Taking  . calcium carbonate (TUMS EX) 750 MG chewable tablet Chew 2 tablets by mouth daily as needed for heartburn.    Taking  . Cyanocobalamin (VITAMIN B-12 IJ) Inject as directed every 30 (thirty) days.   Taking  . fluticasone (FLONASE) 50 MCG/ACT nasal spray Place 1 spray into both nostrils daily as needed for allergies.  1   . hydrochlorothiazide (HYDRODIURIL) 25 MG tablet Take 1 tablet (25 mg total) by mouth daily. 90 tablet 2 Taking  . lisinopril (PRINIVIL,ZESTRIL) 10 MG tablet Take 1 tablet (10 mg total) by mouth every evening. 90 tablet 2 Taking  . loratadine (CLARITIN) 10 MG tablet Take 10 mg by mouth daily as needed for allergies.     . metoprolol (LOPRESSOR) 50 MG tablet 1/2 tablet (25 mg) two times a day 90 tablet 3 Taking  . pantoprazole (PROTONIX) 40 MG tablet Take 1 tablet (40 mg total) by mouth 2 (two) times daily before a meal. 180 tablet 3 Taking  . polyethylene glycol (MIRALAX / GLYCOLAX) packet Take 17 g by mouth daily. (Patient taking differently: Take 17 g by mouth daily as needed. ) 14 each 0 Taking  . pravastatin (PRAVACHOL) 20 MG tablet Take 1 tablet (20 mg total) by mouth every evening. 90 tablet 3 Taking  . pravastatin (PRAVACHOL) 20 MG tablet Take 20 mg by mouth every evening.     . promethazine (PHENERGAN) 25 MG tablet Take 1 tablet (25 mg total) by mouth every 6 (six) hours as needed for nausea or vomiting. 30 tablet 2    Scheduled: . [START ON 08/21/2016] aspirin EC  81 mg Oral Once per day on Mon Thu  . metoprolol tartrate  25 mg Oral BID  . pantoprazole  40 mg Oral BID AC  . pravastatin  20 mg Oral QPM   Continuous: . sodium chloride 125 mL/hr at 08/18/16 2205  . ampicillin (OMNIPEN) IV 2 g (08/19/16 0829)  . cefTRIAXone (ROCEPHIN)  IV    . doxycycline (VIBRAMYCIN) IV Stopped (08/19/16 0108)  . vancomycin  Stopped (08/19/16 0048)   PPI:RJJOACZYSAYTK **OR** acetaminophen (TYLENOL) oral liquid 160 mg/5 mL **OR** acetaminophen   ROS:  Headache completely resolved. No dizziness, vision loss or weakness at time of Neurological consultation. Other ROS as per HPI.   Blood pressure 94/65, pulse 74, temperature (!) 96.5 F (35.8 C), temperature source Oral, resp. rate 19, height 5\' 2"  (1.575 m), weight 89.8 kg (198 lb), SpO2 99 %.  General Examination:                                                                                                      HEENT-  [place]ett[place]/AT   Lungs- Respirations unlabored Extremities- Warm and well perfused  Neurological Examination Mental Status: Alert, fully oriented, thought content appropriate. Pleasant and cooperative. Non-agitated. Affect euthymic. Speech fluent with intact comprehension, naming and repetition.  Able to follow all commands without difficulty. Cranial Nerves: II: Visual fields intact without simultanagnosia III,IV, VI: Ptosis not present, EOMI. No nystagmus.  V,VII: Smile symmetric, facial temp sensation normal bilaterally VIII: hearing intact to conversation IX,X: No hypophonia or pharyngeal dysarthria XI: Symmetric XII: midline tongue extension Motor: Right : Upper extremity   5/5    Left:     Upper extremity   5/5  Lower extremity   5/5     Lower extremity   5/5 Normal tone throughout; no atrophy noted Sensory: Temperature and light touch intact x 4, without extinction Deep Tendon Reflexes: 2+ and symmetric throughout Cerebellar: No ataxia with FNF bilaterally Gait: Deferred  Lab Results: Basic Metabolic Panel:  Recent Labs Lab 08/18/16 1903 08/18/16 1904  NA 137 136  K 4.1 4.1  CL 101 101  CO2  --  23  GLUCOSE 124* 126*  BUN 19 16  CREATININE 1.10* 1.15*  CALCIUM  --  8.9    Liver Function  Tests:  Recent Labs Lab 08/18/16 1904  AST 28  ALT 16  ALKPHOS 54  BILITOT 1.6*  PROT 6.8  ALBUMIN 4.1   No results for input(s): LIPASE, AMYLASE in the last 168 hours. No results for input(s): AMMONIA in the last 168 hours.  CBC:  Recent Labs Lab 08/18/16 1903 08/18/16 1904  WBC  --  11.0*  NEUTROABS  --  9.6*  HGB 15.6* 15.3*  HCT 46.0 45.5  MCV  --  83.6  PLT  --  233    Cardiac Enzymes: No results for input(s): CKTOTAL, CKMB, CKMBINDEX, TROPONINI in the last 168 hours.  Lipid Panel: No results for input(s): CHOL, TRIG, HDL, CHOLHDL, VLDL, LDLCALC in the last 168 hours.  CBG: No results for input(s): GLUCAP in the last 168 hours.  Microbiology: Results for orders placed or performed during the hospital encounter of 02/12/16  MRSA PCR Screening     Status: None   Collection Time: 02/13/16  5:09 AM  Result Value Ref Range Status   MRSA by PCR NEGATIVE NEGATIVE Final    Comment:        The GeneXpert MRSA Assay (FDA approved for NASAL specimens only), is one component of a comprehensive MRSA colonization surveillance program. It is not intended to diagnose MRSA infection nor to guide or monitor treatment for MRSA infections.  Coagulation Studies:  Recent Labs  08/18/16 1904  LABPROT 12.6  INR 0.94    Imaging: Ct Head Wo Contrast  Result Date: 08/18/2016 CLINICAL DATA:  Altered mental status today. EXAM: CT HEAD WITHOUT CONTRAST TECHNIQUE: Contiguous axial images were obtained from the base of the skull through the vertex without intravenous contrast. COMPARISON:  Head CT dated 02/11/2016. FINDINGS: Brain: Ventricles are normal in size and configuration. There is no mass, hemorrhage, edema or other evidence of acute parenchymal abnormality. No extra-axial hemorrhage. Mild chronic small vessel ischemic changes noted within the white matter. Vascular: There are chronic calcified atherosclerotic changes of the large vessels at the skull base. No  unexpected hyperdense vessel. Skull: Normal. Negative for fracture or focal lesion. Sinuses/Orbits: No acute finding. Other: None. IMPRESSION: No acute findings. No intracranial mass, hemorrhage or edema. Mild chronic small vessel ischemic changes within the white matter. Electronically Signed   By: Franki Cabot M.D.   On: 08/18/2016 19:18   Dg Chest Port 1 View  Result Date: 08/18/2016 CLINICAL DATA:  Fever and altered mental status EXAM: PORTABLE CHEST 1 VIEW COMPARISON:  02/12/2016 FINDINGS: Right lung is grossly clear. Slightly elevated left diaphragm with minimal left basilar atelectasis. No focal consolidation. There is mild cardiomegaly. Aortic atherosclerosis. No pneumothorax. IMPRESSION: Cardiomegaly with minimal left basilar atelectasis. Electronically Signed   By: Donavan Foil M.D.   On: 08/18/2016 21:58    Assessment: 76 year old female presenting with symptoms that are most consistent with complicated migraine 1. Headache pain now resolved.  2. Confusion resolved.  3. Neurological examination is normal. No cognitive or speech deficit noted.  4. CT head was negative for acute abnormality.  5. On empiric ABX with LP pending. Relatively low probability that this is a meningitis, but given antibiotics have been administered, cannot rule out that her improvement was not due to partial treatment of a bacterial meningitis.  Recommendations: 1. Await results of fluoroscopically guided LP.  2. If LP is negative, can discharge home with outpatient Neurology follow up.   Electronically signed: Dr. Kerney Elbe 08/19/2016, 9:14 AM

## 2016-08-19 NOTE — Telephone Encounter (Signed)
OK - please let her know if she does not to call back and reschedule AT HOSPITAL - will need to be done on another hospital procedure day or week

## 2016-08-19 NOTE — Progress Notes (Signed)
PHARMACY - PHYSICIAN COMMUNICATION CRITICAL VALUE ALERT - BLOOD CULTURE IDENTIFICATION (BCID)  Results for orders placed or performed during the hospital encounter of 08/18/16  Blood Culture ID Panel (Reflexed) (Collected: 08/18/2016 10:08 PM)  Result Value Ref Range   Enterococcus species NOT DETECTED NOT DETECTED   Listeria monocytogenes NOT DETECTED NOT DETECTED   Staphylococcus species DETECTED (A) NOT DETECTED   Staphylococcus aureus NOT DETECTED NOT DETECTED   Methicillin resistance NOT DETECTED NOT DETECTED   Streptococcus species NOT DETECTED NOT DETECTED   Streptococcus agalactiae NOT DETECTED NOT DETECTED   Streptococcus pneumoniae NOT DETECTED NOT DETECTED   Streptococcus pyogenes NOT DETECTED NOT DETECTED   Acinetobacter baumannii NOT DETECTED NOT DETECTED   Enterobacteriaceae species NOT DETECTED NOT DETECTED   Enterobacter cloacae complex NOT DETECTED NOT DETECTED   Escherichia coli NOT DETECTED NOT DETECTED   Klebsiella oxytoca NOT DETECTED NOT DETECTED   Klebsiella pneumoniae NOT DETECTED NOT DETECTED   Proteus species NOT DETECTED NOT DETECTED   Serratia marcescens NOT DETECTED NOT DETECTED   Haemophilus influenzae NOT DETECTED NOT DETECTED   Neisseria meningitidis NOT DETECTED NOT DETECTED   Pseudomonas aeruginosa NOT DETECTED NOT DETECTED   Candida albicans NOT DETECTED NOT DETECTED   Candida glabrata NOT DETECTED NOT DETECTED   Candida krusei NOT DETECTED NOT DETECTED   Candida parapsilosis NOT DETECTED NOT DETECTED   Candida tropicalis NOT DETECTED NOT DETECTED    Name of physician (or Provider) Contacted:  Baltazar Najjar  Changes to prescribed antibiotics required:  No change, await CSF cx   Denaya Horn D. Mina Marble, PharmD, BCPS Pager:  [phone] 08/19/2016, 8:09 PM

## 2016-08-20 ENCOUNTER — Ambulatory Visit (HOSPITAL_COMMUNITY): Admission: RE | Admit: 2016-08-20 | Payer: Medicare Other | Source: Ambulatory Visit | Admitting: Internal Medicine

## 2016-08-20 ENCOUNTER — Inpatient Hospital Stay (HOSPITAL_COMMUNITY): Payer: Medicare Other

## 2016-08-20 ENCOUNTER — Encounter (HOSPITAL_COMMUNITY): Admission: RE | Payer: Self-pay | Source: Ambulatory Visit

## 2016-08-20 DIAGNOSIS — G43819 Other migraine, intractable, without status migrainosus: Secondary | ICD-10-CM

## 2016-08-20 DIAGNOSIS — I10 Essential (primary) hypertension: Secondary | ICD-10-CM

## 2016-08-20 DIAGNOSIS — Z833 Family history of diabetes mellitus: Secondary | ICD-10-CM

## 2016-08-20 DIAGNOSIS — Z86718 Personal history of other venous thrombosis and embolism: Secondary | ICD-10-CM

## 2016-08-20 DIAGNOSIS — Z888 Allergy status to other drugs, medicaments and biological substances status: Secondary | ICD-10-CM

## 2016-08-20 DIAGNOSIS — M069 Rheumatoid arthritis, unspecified: Secondary | ICD-10-CM

## 2016-08-20 DIAGNOSIS — R7881 Bacteremia: Secondary | ICD-10-CM

## 2016-08-20 DIAGNOSIS — Z800 Family history of malignant neoplasm of digestive organs: Secondary | ICD-10-CM

## 2016-08-20 DIAGNOSIS — Z823 Family history of stroke: Secondary | ICD-10-CM

## 2016-08-20 DIAGNOSIS — Z8249 Family history of ischemic heart disease and other diseases of the circulatory system: Secondary | ICD-10-CM

## 2016-08-20 DIAGNOSIS — M419 Scoliosis, unspecified: Secondary | ICD-10-CM

## 2016-08-20 LAB — COMPREHENSIVE METABOLIC PANEL
ALBUMIN: 2.7 g/dL — AB (ref 3.5–5.0)
ALK PHOS: 36 U/L — AB (ref 38–126)
ALT: 10 U/L — ABNORMAL LOW (ref 14–54)
AST: 16 U/L (ref 15–41)
Anion gap: 7 (ref 5–15)
BILIRUBIN TOTAL: 0.6 mg/dL (ref 0.3–1.2)
BUN: 10 mg/dL (ref 6–20)
CALCIUM: 7.6 mg/dL — AB (ref 8.9–10.3)
CO2: 23 mmol/L (ref 22–32)
Chloride: 110 mmol/L (ref 101–111)
Creatinine, Ser: 0.98 mg/dL (ref 0.44–1.00)
GFR calc Af Amer: >60 mL/min (ref >60)
GFR calc non Af Amer: 55 mL/min — ABNORMAL LOW (ref >60)
GLUCOSE: 160 mg/dL — AB (ref 65–99)
Potassium: 3.5 mmol/L (ref 3.5–5.1)
Sodium: 140 mmol/L (ref 135–145)
TOTAL PROTEIN: 4.8 g/dL — AB (ref 6.5–8.1)

## 2016-08-20 LAB — CBC
HCT: 38.9 % (ref 36.0–46.0)
Hemoglobin: 12.5 g/dL (ref 12.0–15.0)
MCH: 27.9 pg (ref 26.0–34.0)
MCHC: 32.1 g/dL (ref 30.0–36.0)
MCV: 86.8 fL (ref 78.0–100.0)
Platelets: 172 10*3/uL (ref 150–400)
RBC: 4.48 MIL/uL (ref 3.87–5.11)
RDW: 13.9 % (ref 11.5–15.5)
WBC: 5.9 10*3/uL (ref 4.0–10.5)

## 2016-08-20 LAB — VDRL, CSF: VDRL Quant, CSF: NONREACTIVE

## 2016-08-20 LAB — B. BURGDORFI ANTIBODIES

## 2016-08-20 SURGERY — ESOPHAGOGASTRODUODENOSCOPY (EGD) WITH PROPOFOL
Anesthesia: Monitor Anesthesia Care

## 2016-08-20 MED ORDER — GADOBENATE DIMEGLUMINE 529 MG/ML IV SOLN
18.0000 mL | Freq: Once | INTRAVENOUS | Status: AC | PRN
  Administered 2016-08-20: 18 mL via INTRAVENOUS

## 2016-08-20 NOTE — Progress Notes (Signed)
PROGRESS NOTE    [name]  GBT:[id number] DOB: 1940/08/31 DOA: 08/18/2016 PCP: [name], MD   Brief Narrative: [name] is a 76 y.o. female with a history of rheumatoid arthritis, severe scoliosis, chronic back pain, lumbar laminectomy, GERD, DVT, hypertension, hyperlipidemia, migraine headaches. She presented with intractable headache and fever with concern for meningitis. CSF studies do not suggest meningitis at this time. Empiric coverage for possible tickborne illnesses well.   Assessment & Plan:   Principal Problem:   Acute encephalopathy Active Problems:   GERD   Acute intractable headache   Obesity   Sepsis ([place])   Acute encephalopathy Fever Resolved now. Initial concern of meningitis. Preliminary CSF studies do not support that diagnosis. One out of 2 blood culture bottles positive for staph species. -Infectious disease, Dr. Linus Salmons, to see patient -will await infectious disease consult before discontinuing meningitis coverage -Neurology recommendations: MRI/MRA, Lyme titers -RMSF pending -blood/CSF cultures pending  Intractable migraine headache Resolved. No acute CT findings.  Hyperlipidemia -continue Pravachol  GERD -continue PPI  Essential hypertension Blood pressure labile -continue to hold lisinopril -continue metoprolol   DVT prophylaxis: Lovenox Code Status: Full code Family Communication: None at bedside Disposition Plan: Discharge pending neuro workup   Consultants:   Neurology  Infectious disease  Procedures:   LP (08/19/16)  Antimicrobials:  Vancomycin  Ceftriaxone  Ampicillin  Doxycycline    Subjective: Patient reports resolution of headache. No fever or chills.  Objective: Vitals:   08/20/16 0014 08/20/16 0144 08/20/16 0554 08/20/16 1024  BP: (!) 128/43 (!) 112/43 (!) 120/58 (!) 160/49  Pulse: 76 79 62 86  Resp:  18 16 17   Temp:  98.9 F (37.2 C)  98.5 F (36.9 C)  TempSrc:  Oral  Oral    SpO2:  100% 98% 99%  Weight:      Height:        Intake/Output Summary (Last 24 hours) at 08/20/16 1313 Last data filed at 08/20/16 0600  Gross per 24 hour  Intake             4530 ml  Output              200 ml  Net             4330 ml   Filed Weights   08/18/16 1813  Weight: 89.8 kg (198 lb)    Examination:  General exam: Appears calm and comfortable Respiratory system: Clear to auscultation. Respiratory effort normal. Cardiovascular system: S1 & S2 heard, RRR. No murmurs. Gastrointestinal system: Abdomen is nondistended, soft and nontender. Normal bowel sounds heard. Central nervous system: Alert and oriented. No focal neurological deficits. No neck rigidity Extremities: No edema. No calf tenderness Skin: No cyanosis. No rashes Psychiatry: Judgement and insight appear normal. Mood & affect appropriate.     Data Reviewed: I have personally reviewed following labs and imaging studies  CBC:  Recent Labs Lab 08/18/16 1903 08/18/16 1904 08/20/16 0342  WBC  --  11.0* 5.9  NEUTROABS  --  9.6*  --   HGB 15.6* 15.3* 12.5  HCT 46.0 45.5 38.9  MCV  --  83.6 86.8  PLT  --  233 710   Basic Metabolic Panel:  Recent Labs Lab 08/18/16 1903 08/18/16 1904 08/20/16 0342  NA 137 136 140  K 4.1 4.1 3.5  CL 101 101 110  CO2  --  23 23  GLUCOSE 124* 126* 160*  BUN 19 16 10   CREATININE 1.10* 1.15*  0.98  CALCIUM  --  8.9 7.6*   GFR: Estimated Creatinine Clearance: 51.7 mL/min (by C-G formula based on SCr of 0.98 mg/dL).   Liver Function Tests:  Recent Labs Lab 08/18/16 1904 08/20/16 0342  AST 28 16  ALT 16 10*  ALKPHOS 54 36*  BILITOT 1.6* 0.6  PROT 6.8 4.8*  ALBUMIN 4.1 2.7*   No results for input(s): LIPASE, AMYLASE in the last 168 hours. No results for input(s): AMMONIA in the last 168 hours. Coagulation Profile:  Recent Labs Lab 08/18/16 1904  INR 0.94   Cardiac Enzymes: No results for input(s): CKTOTAL, CKMB, CKMBINDEX, TROPONINI in the last  168 hours. BNP (last 3 results) No results for input(s): PROBNP in the last 8760 hours. HbA1C: No results for input(s): HGBA1C in the last 72 hours. CBG: No results for input(s): GLUCAP in the last 168 hours. Lipid Profile: No results for input(s): CHOL, HDL, LDLCALC, TRIG, CHOLHDL, LDLDIRECT in the last 72 hours. Thyroid Function Tests: No results for input(s): TSH, T4TOTAL, FREET4, T3FREE, THYROIDAB in the last 72 hours. Anemia Panel: No results for input(s): VITAMINB12, FOLATE, FERRITIN, TIBC, IRON, RETICCTPCT in the last 72 hours.   Sepsis Labs:  Recent Labs Lab 08/18/16 1904 08/18/16 2215 08/19/16 0112 08/19/16 0207 08/19/16 0408  PROCALCITON <0.10  --   --   --   --   LATICACIDVEN  --  4.70* 2.74* 2.2* 1.05    Recent Results (from the past 240 hour(s))  Culture, blood (routine x 2)     Status: None (Preliminary result)   Collection Time: 08/18/16  9:48 PM  Result Value Ref Range Status   Specimen Description BLOOD RIGHT HAND  Final   Special Requests IN PEDIATRIC BOTTLE Blood Culture adequate volume  Final   Culture NO GROWTH < 24 HOURS  Final   Report Status PENDING  Incomplete  Culture, blood (routine x 2)     Status: None (Preliminary result)   Collection Time: 08/18/16 10:08 PM  Result Value Ref Range Status   Specimen Description BLOOD RIGHT WRIST  Final   Special Requests   Final    BOTTLES DRAWN AEROBIC AND ANAEROBIC Blood Culture adequate volume   Culture  Setup Time   Final    GRAM POSITIVE COCCI IN CLUSTERS AEROBIC BOTTLE ONLY CRITICAL RESULT CALLED TO, READ BACK BY AND VERIFIED WITH: T. DANG PHARM 08/19/16 2004 BEAMJ    Culture GRAM POSITIVE COCCI  Final   Report Status PENDING  Incomplete  Blood Culture ID Panel (Reflexed)     Status: Abnormal   Collection Time: 08/18/16 10:08 PM  Result Value Ref Range Status   Enterococcus species NOT DETECTED NOT DETECTED Final   Listeria monocytogenes NOT DETECTED NOT DETECTED Final   Staphylococcus species  DETECTED (A) NOT DETECTED Final    Comment: Methicillin (oxacillin) susceptible coagulase negative staphylococcus. Possible blood culture contaminant (unless isolated from more than one blood culture draw or clinical case suggests pathogenicity). No antibiotic treatment is indicated for blood  culture contaminants. CRITICAL RESULT CALLED TO, READ BACK BY AND VERIFIED WITH: T. DANG PHARM 08/19/16 2004 BEAMJ    Staphylococcus aureus NOT DETECTED NOT DETECTED Final   Methicillin resistance NOT DETECTED NOT DETECTED Final   Streptococcus species NOT DETECTED NOT DETECTED Final   Streptococcus agalactiae NOT DETECTED NOT DETECTED Final   Streptococcus pneumoniae NOT DETECTED NOT DETECTED Final   Streptococcus pyogenes NOT DETECTED NOT DETECTED Final   Acinetobacter baumannii NOT DETECTED NOT DETECTED Final  Enterobacteriaceae species NOT DETECTED NOT DETECTED Final   Enterobacter cloacae complex NOT DETECTED NOT DETECTED Final   Escherichia coli NOT DETECTED NOT DETECTED Final   Klebsiella oxytoca NOT DETECTED NOT DETECTED Final   Klebsiella pneumoniae NOT DETECTED NOT DETECTED Final   Proteus species NOT DETECTED NOT DETECTED Final   Serratia marcescens NOT DETECTED NOT DETECTED Final   Haemophilus influenzae NOT DETECTED NOT DETECTED Final   Neisseria meningitidis NOT DETECTED NOT DETECTED Final   Pseudomonas aeruginosa NOT DETECTED NOT DETECTED Final   Candida albicans NOT DETECTED NOT DETECTED Final   Candida glabrata NOT DETECTED NOT DETECTED Final   Candida krusei NOT DETECTED NOT DETECTED Final   Candida parapsilosis NOT DETECTED NOT DETECTED Final   Candida tropicalis NOT DETECTED NOT DETECTED Final  CSF culture     Status: None (Preliminary result)   Collection Time: 08/19/16  4:19 PM  Result Value Ref Range Status   Specimen Description CSF  Final   Special Requests NONE  Final   Gram Stain   Final    CYTOSPIN SMEAR WBC PRESENT, PREDOMINANTLY MONONUCLEAR NO ORGANISMS SEEN      Culture NO GROWTH < 24 HOURS  Final   Report Status PENDING  Incomplete         Radiology Studies: Ct Head Wo Contrast  Result Date: 08/18/2016 CLINICAL DATA:  Altered mental status today. EXAM: CT HEAD WITHOUT CONTRAST TECHNIQUE: Contiguous axial images were obtained from the base of the skull through the vertex without intravenous contrast. COMPARISON:  Head CT dated 02/11/2016. FINDINGS: Brain: Ventricles are normal in size and configuration. There is no mass, hemorrhage, edema or other evidence of acute parenchymal abnormality. No extra-axial hemorrhage. Mild chronic small vessel ischemic changes noted within the white matter. Vascular: There are chronic calcified atherosclerotic changes of the large vessels at the skull base. No unexpected hyperdense vessel. Skull: Normal. Negative for fracture or focal lesion. Sinuses/Orbits: No acute finding. Other: None. IMPRESSION: No acute findings. No intracranial mass, hemorrhage or edema. Mild chronic small vessel ischemic changes within the white matter. Electronically Signed   By: Franki Cabot M.D.   On: 08/18/2016 19:18   Dg Chest Port 1 View  Result Date: 08/18/2016 CLINICAL DATA:  Fever and altered mental status EXAM: PORTABLE CHEST 1 VIEW COMPARISON:  02/12/2016 FINDINGS: Right lung is grossly clear. Slightly elevated left diaphragm with minimal left basilar atelectasis. No focal consolidation. There is mild cardiomegaly. Aortic atherosclerosis. No pneumothorax. IMPRESSION: Cardiomegaly with minimal left basilar atelectasis. Electronically Signed   By: Donavan Foil M.D.   On: 08/18/2016 21:58   Dg Fluoro Guide Lumbar Puncture  Result Date: 08/19/2016 CLINICAL DATA:  Severe headache. EXAM: DIAGNOSTIC LUMBAR PUNCTURE UNDER FLUOROSCOPIC GUIDANCE FLUOROSCOPY TIME:  Fluoroscopy Time:  0 minutes and 12 seconds Radiation Exposure Index (if provided by the fluoroscopic device): N/A Number of Acquired Spot Images: 0 PROCEDURE: Informed consent was  obtained from the patient prior to the procedure, including potential complications of headache, allergy, and pain. With the patient prone, the lower back was prepped with Betadine. 1% Lidocaine was used for local anesthesia. Lumbar puncture was performed at the L4-5 level using a 20 gauge needle with return of clear CSF. 8.0 ml of CSF were obtained for laboratory studies. The patient tolerated the procedure well and there were no apparent complications. IMPRESSION: Fluoroscopic guided lumbar puncture with clear CSF sent for appropriate laboratory evaluation. Patient tolerated the procedure well without immediate complications. Electronically Signed   By: Marijo Sanes  M.D.   On: 08/19/2016 16:31        Scheduled Meds: . Derrill Memo ON 08/21/2016] aspirin EC  81 mg Oral Once per day on Mon Thu  . enoxaparin (LOVENOX) injection  40 mg Subcutaneous Q24H  . metoprolol tartrate  25 mg Oral BID  . pantoprazole  40 mg Oral BID AC  . pravastatin  20 mg Oral QPM   Continuous Infusions: . sodium chloride 125 mL/hr at 08/20/16 0641  . ampicillin (OMNIPEN) IV Stopped (08/20/16 0835)  . cefTRIAXone (ROCEPHIN)  IV Stopped (08/20/16 0042)  . doxycycline (VIBRAMYCIN) IV Stopped (08/20/16 0356)  . vancomycin Stopped (08/20/16 0155)     LOS: 1 day     Cordelia Poche, MD [facility] 08/20/2016, 1:13 PM Pager: [phone]  If 7PM-7AM, please contact night-coverage [email or web address] Password [email or web address] 08/20/2016, 1:13 PM

## 2016-08-20 NOTE — Consult Note (Signed)
[facility]         Reason for Consult: Antibiotic management, bacteremia    Referring Physician: Dr. Lonny Prude   HPI: [name] is a 76 y.o. woman with PMHx of rheumatoid arthritis, severe scoliosis, prior DVT, HTN, and headaches who was admitted on 6/11 for an intractable headache. She had been evaluated at her neurologist's office and despite in-office treatments her headache remained so she was advised to go to the ED. In the ED, she was noted to have altered mental status, only following a few commands. About 1 hour later on repeat assessment, she became awake and alert and was able to recall her clinical history. She had described finding a tick on her right leg Friday evening and had pulled it out. Around this time she was noted to have a fever of 100.7. A code sepsis was activated and she received empiric ceftriaxone, vancomycin, and doxycycline. Blood cultures were obtained prior to antibiotic administration. Patient was advised to undergo LP but patient preferred to undergo LP the following day under fluoro guidance. She received a dose of ampicillin the morning after admission. CSF results not concerning for meningitis. Blood cx growing 1 out of 2 coagulase negative staph.  Patient reports her migraine has improved. She is still experiencing some discomfort on the back of her head and neck. She denies having any fevers at home. She reports always having some neck stiffness/pain that is chronic.   Past Medical History:  Diagnosis Date  . Arthritis    "got alot; especially in my spine"  . Chest pain 02/2016  . Chronic back pain    "all of it"  . Classical migraine with intractable migraine 02/25/2016  . Diverticulosis of colon (without mention of hemorrhage)   . DVT of leg (deep venous thrombosis) ([place]) 09/2011   LLE  . Dysrhythmia    notes in epic  . Esophageal reflux   . Family history of malignant neoplasm of gastrointestinal tract   .  Fibromyalgia    "dx'd w/it when it first came out"  . Hiatal hernia   . Hyperlipidemia   . Hypertension   . Migraines 09/24/11   "often recently;  more controlled now"  . Obesity, unspecified   . Osteoporosis   . Palpitations    event monitor 8/13: NSR, Sinus brady, Sinus Tachy, PAC  . Personal history of colonic polyps 05/31/2007   adenomatous polyp  . Pneumonia    "3 times at least" (09/24/11)  . Rheumatoid arthritis(714.0)   . Scoliosis   . Stricture and stenosis of esophagus   . Unspecified gastritis and gastroduodenitis without mention of hemorrhage     Allergies:  Allergies  Allergen Reactions  . Reglan [Metoclopramide] Other (See Comments)    Speech is garbled, extreme trouble making words    Current antibiotics:   MEDICATIONS: . [START ON 08/21/2016] aspirin EC  81 mg Oral Once per day on Mon Thu  . enoxaparin (LOVENOX) injection  40 mg Subcutaneous Q24H  . metoprolol tartrate  25 mg Oral BID  . pantoprazole  40 mg Oral BID AC  . pravastatin  20 mg Oral QPM    Social History  Substance Use Topics  . Smoking status: Never Smoker  . Smokeless tobacco: Never Used  . Alcohol use No    Family History  Problem Relation Age of Onset  . Stroke Mother   . Hypertension Mother   . Stroke Father   . Hypertension Father   .  Diabetes Father   . Pancreatic cancer Sister   . Diabetes Sister        x 2  . Colon cancer Sister 93  . Pancreatic cancer Sister   . Heart attack Neg Hx   . Stomach cancer Neg Hx     Review of Systems -  All negative except per HPI   OBJECTIVE: Temp:  [98.4 F (36.9 C)-98.9 F (37.2 C)] 98.4 F (36.9 C) (06/13 1405) Pulse Rate:  [62-91] 91 (06/13 1405) Resp:  [16-18] 18 (06/13 1405) BP: (112-174)/(43-67) 174/65 (06/13 1405) SpO2:  [98 %-100 %] 98 % (06/13 1405)  General: Elderly woman resting in bed, NAD HEENT: [place]/AT, EOMI, sclera anicteric, no thrush present, mucus membranes moist CV: RRR, no m/g/r Pulm: CTA bilaterally,  breaths non-labored Abd: BS+, soft, non-tender, obese Msk: No peripheral edema. Distal pulses 2+ Neuro: alert and oriented x 3, strength intact Skin: Small erythematous lesion on medial right knee where tick was present   LABS: Results for orders placed or performed during the hospital encounter of 08/18/16 (from the past 48 hour(s))  Ethanol     Status: None   Collection Time: 08/18/16  7:01 PM  Result Value Ref Range   Alcohol, Ethyl (B) <5 <5 mg/dL    Comment:        LOWEST DETECTABLE LIMIT FOR SERUM ALCOHOL IS 5 mg/dL FOR MEDICAL PURPOSES ONLY   I-stat troponin, ED (not at [facility], [facility])     Status: None   Collection Time: 08/18/16  7:01 PM  Result Value Ref Range   Troponin i, poc 0.00 0.00 - 0.08 ng/mL   Comment 3            Comment: Due to the release kinetics of cTnI, a negative result within the first hours of the onset of symptoms does not rule out myocardial infarction with certainty. If myocardial infarction is still suspected, repeat the test at appropriate intervals.   I-Stat Chem 8, ED  (not at [facility], [facility])     Status: Abnormal   Collection Time: 08/18/16  7:03 PM  Result Value Ref Range   Sodium 137 135 - 145 mmol/L   Potassium 4.1 3.5 - 5.1 mmol/L   Chloride 101 101 - 111 mmol/L   BUN 19 6 - 20 mg/dL   Creatinine, Ser 1.10 (H) 0.44 - 1.00 mg/dL   Glucose, Bld 124 (H) 65 - 99 mg/dL   Calcium, Ion 1.01 (L) 1.15 - 1.40 mmol/L   TCO2 24 0 - 100 mmol/L   Hemoglobin 15.6 (H) 12.0 - 15.0 g/dL   HCT 46.0 36.0 - 46.0 %  Protime-INR     Status: None   Collection Time: 08/18/16  7:04 PM  Result Value Ref Range   Prothrombin Time 12.6 11.4 - 15.2 seconds   INR 0.94   APTT     Status: None   Collection Time: 08/18/16  7:04 PM  Result Value Ref Range   aPTT 27 24 - 36 seconds  CBC     Status: Abnormal   Collection Time: 08/18/16  7:04 PM  Result Value Ref Range   WBC 11.0 (H) 4.0 - 10.5 K/uL   RBC 5.44 (H) 3.87 - 5.11 MIL/uL   Hemoglobin 15.3 (H) 12.0 - 15.0 g/dL     HCT 45.5 36.0 - 46.0 %   MCV 83.6 78.0 - 100.0 fL   MCH 28.1 26.0 - 34.0 pg   MCHC 33.6 30.0 - 36.0 g/dL   RDW 13.2  11.5 - 15.5 %   Platelets 233 150 - 400 K/uL  Differential     Status: Abnormal   Collection Time: 08/18/16  7:04 PM  Result Value Ref Range   Neutrophils Relative % 87 %   Neutro Abs 9.6 (H) 1.7 - 7.7 K/uL   Lymphocytes Relative 10 %   Lymphs Abs 1.1 0.7 - 4.0 K/uL   Monocytes Relative 3 %   Monocytes Absolute 0.3 0.1 - 1.0 K/uL   Eosinophils Relative 0 %   Eosinophils Absolute 0.0 0.0 - 0.7 K/uL   Basophils Relative 0 %   Basophils Absolute 0.0 0.0 - 0.1 K/uL  Comprehensive metabolic panel     Status: Abnormal   Collection Time: 08/18/16  7:04 PM  Result Value Ref Range   Sodium 136 135 - 145 mmol/L   Potassium 4.1 3.5 - 5.1 mmol/L   Chloride 101 101 - 111 mmol/L   CO2 23 22 - 32 mmol/L   Glucose, Bld 126 (H) 65 - 99 mg/dL   BUN 16 6 - 20 mg/dL   Creatinine, Ser 1.15 (H) 0.44 - 1.00 mg/dL   Calcium 8.9 8.9 - 10.3 mg/dL   Total Protein 6.8 6.5 - 8.1 g/dL   Albumin 4.1 3.5 - 5.0 g/dL   AST 28 15 - 41 U/L   ALT 16 14 - 54 U/L   Alkaline Phosphatase 54 38 - 126 U/L   Total Bilirubin 1.6 (H) 0.3 - 1.2 mg/dL   GFR calc non Af Amer 45 (L) >60 mL/min   GFR calc Af Amer 53 (L) >60 mL/min    Comment: (NOTE) The eGFR has been calculated using the CKD EPI equation. This calculation has not been validated in all clinical situations. eGFR's persistently <60 mL/min signify possible Chronic Kidney Disease.    Anion gap 12 5 - 15  Procalcitonin     Status: None   Collection Time: 08/18/16  7:04 PM  Result Value Ref Range   Procalcitonin <0.10 ng/mL    Comment:        Interpretation: PCT (Procalcitonin) <= 0.5 ng/mL: Systemic infection (sepsis) is not likely. Local bacterial infection is possible. (NOTE)         ICU PCT Algorithm               Non ICU PCT Algorithm    ----------------------------     ------------------------------         PCT < 0.25 ng/mL                  PCT < 0.1 ng/mL     Stopping of antibiotics            Stopping of antibiotics       strongly encouraged.               strongly encouraged.    ----------------------------     ------------------------------       PCT level decrease by               PCT < 0.25 ng/mL       >= 80% from peak PCT       OR PCT 0.25 - 0.5 ng/mL          Stopping of antibiotics  encouraged.     Stopping of antibiotics           encouraged.    ----------------------------     ------------------------------       PCT level decrease by              PCT >= 0.25 ng/mL       < 80% from peak PCT        AND PCT >= 0.5 ng/mL            Continuin g antibiotics                                              encouraged.       Continuing antibiotics            encouraged.    ----------------------------     ------------------------------     PCT level increase compared          PCT > 0.5 ng/mL         with peak PCT AND          PCT >= 0.5 ng/mL             Escalation of antibiotics                                          strongly encouraged.      Escalation of antibiotics        strongly encouraged.   Urine rapid drug screen (hosp performed)not at [facility]     Status: None   Collection Time: 08/18/16  8:13 PM  Result Value Ref Range   Opiates NONE DETECTED NONE DETECTED   Cocaine NONE DETECTED NONE DETECTED   Benzodiazepines NONE DETECTED NONE DETECTED   Amphetamines NONE DETECTED NONE DETECTED   Tetrahydrocannabinol NONE DETECTED NONE DETECTED   Barbiturates NONE DETECTED NONE DETECTED    Comment:        DRUG SCREEN FOR MEDICAL PURPOSES ONLY.  IF CONFIRMATION IS NEEDED FOR ANY PURPOSE, NOTIFY LAB WITHIN 5 DAYS.        LOWEST DETECTABLE LIMITS FOR URINE DRUG SCREEN Drug Class       Cutoff (ng/mL) Amphetamine      1000 Barbiturate      200 Benzodiazepine   629 Tricyclics       476 Opiates          300 Cocaine          300 THC              50    Urinalysis, Routine w reflex microscopic     Status: Abnormal   Collection Time: 08/18/16  8:13 PM  Result Value Ref Range   Color, Urine YELLOW YELLOW   APPearance CLEAR CLEAR   Specific Gravity, Urine 1.016 1.005 - 1.030   pH 7.0 5.0 - 8.0   Glucose, UA NEGATIVE NEGATIVE mg/dL   Hgb urine dipstick NEGATIVE NEGATIVE   Bilirubin Urine NEGATIVE NEGATIVE   Ketones, ur 20 (A) NEGATIVE mg/dL   Protein, ur NEGATIVE NEGATIVE mg/dL   Nitrite NEGATIVE NEGATIVE   Leukocytes, UA NEGATIVE NEGATIVE  I-Stat arterial blood gas, ED     Status: Abnormal   Collection Time: 08/18/16  9:38 PM  Result Value Ref  Range   pH, Arterial 7.457 (H) 7.350 - 7.450   pCO2 arterial 35.1 32.0 - 48.0 mmHg   pO2, Arterial 77.0 (L) 83.0 - 108.0 mmHg   Bicarbonate 24.5 20.0 - 28.0 mmol/L   TCO2 26 0 - 100 mmol/L   O2 Saturation 95.0 %   Acid-Base Excess 1.0 0.0 - 2.0 mmol/L   Patient temperature 100.7 F    Collection site RADIAL, ALLEN'S TEST ACCEPTABLE    Drawn by RT    Sample type ARTERIAL   Culture, blood (routine x 2)     Status: None (Preliminary result)   Collection Time: 08/18/16  9:48 PM  Result Value Ref Range   Specimen Description BLOOD RIGHT HAND    Special Requests IN PEDIATRIC BOTTLE Blood Culture adequate volume    Culture NO GROWTH 1 DAY    Report Status PENDING   Culture, blood (routine x 2)     Status: None (Preliminary result)   Collection Time: 08/18/16 10:08 PM  Result Value Ref Range   Specimen Description BLOOD RIGHT WRIST    Special Requests      BOTTLES DRAWN AEROBIC AND ANAEROBIC Blood Culture adequate volume   Culture  Setup Time      GRAM POSITIVE COCCI IN CLUSTERS AEROBIC BOTTLE ONLY CRITICAL RESULT CALLED TO, READ BACK BY AND VERIFIED WITH: T. DANG PHARM 08/19/16 2004 BEAMJ    Culture GRAM POSITIVE COCCI    Report Status PENDING   Blood Culture ID Panel (Reflexed)     Status: Abnormal   Collection Time: 08/18/16 10:08 PM  Result Value Ref Range   Enterococcus species  NOT DETECTED NOT DETECTED   Listeria monocytogenes NOT DETECTED NOT DETECTED   Staphylococcus species DETECTED (A) NOT DETECTED    Comment: Methicillin (oxacillin) susceptible coagulase negative staphylococcus. Possible blood culture contaminant (unless isolated from more than one blood culture draw or clinical case suggests pathogenicity). No antibiotic treatment is indicated for blood  culture contaminants. CRITICAL RESULT CALLED TO, READ BACK BY AND VERIFIED WITH: T. DANG PHARM 08/19/16 2004 BEAMJ    Staphylococcus aureus NOT DETECTED NOT DETECTED   Methicillin resistance NOT DETECTED NOT DETECTED   Streptococcus species NOT DETECTED NOT DETECTED   Streptococcus agalactiae NOT DETECTED NOT DETECTED   Streptococcus pneumoniae NOT DETECTED NOT DETECTED   Streptococcus pyogenes NOT DETECTED NOT DETECTED   Acinetobacter baumannii NOT DETECTED NOT DETECTED   Enterobacteriaceae species NOT DETECTED NOT DETECTED   Enterobacter cloacae complex NOT DETECTED NOT DETECTED   Escherichia coli NOT DETECTED NOT DETECTED   Klebsiella oxytoca NOT DETECTED NOT DETECTED   Klebsiella pneumoniae NOT DETECTED NOT DETECTED   Proteus species NOT DETECTED NOT DETECTED   Serratia marcescens NOT DETECTED NOT DETECTED   Haemophilus influenzae NOT DETECTED NOT DETECTED   Neisseria meningitidis NOT DETECTED NOT DETECTED   Pseudomonas aeruginosa NOT DETECTED NOT DETECTED   Candida albicans NOT DETECTED NOT DETECTED   Candida glabrata NOT DETECTED NOT DETECTED   Candida krusei NOT DETECTED NOT DETECTED   Candida parapsilosis NOT DETECTED NOT DETECTED   Candida tropicalis NOT DETECTED NOT DETECTED  I-Stat CG4 Lactic Acid, ED     Status: Abnormal   Collection Time: 08/18/16 10:15 PM  Result Value Ref Range   Lactic Acid, Venous 4.70 (HH) 0.5 - 1.9 mmol/L   Comment NOTIFIED PHYSICIAN   I-Stat CG4 Lactic Acid, ED     Status: Abnormal   Collection Time: 08/19/16  1:12 AM  Result Value Ref Range   Lactic  Acid,  Venous 2.74 (HH) 0.5 - 1.9 mmol/L   Comment NOTIFIED PHYSICIAN   Lactic acid, plasma     Status: Abnormal   Collection Time: 08/19/16  2:07 AM  Result Value Ref Range   Lactic Acid, Venous 2.2 (HH) 0.5 - 1.9 mmol/L    Comment: CRITICAL RESULT CALLED TO, READ BACK BY AND VERIFIED WITH: J.FRANSISCO,RN [id number] 08/19/16 M.CAMPBELL   I-Stat CG4 Lactic Acid, ED     Status: None   Collection Time: 08/19/16  4:08 AM  Result Value Ref Range   Lactic Acid, Venous 1.05 0.5 - 1.9 mmol/L  Glucose, CSF     Status: None   Collection Time: 08/19/16  4:19 PM  Result Value Ref Range   Glucose, CSF 64 40 - 70 mg/dL  Protein, CSF     Status: Abnormal   Collection Time: 08/19/16  4:19 PM  Result Value Ref Range   Total  Protein, CSF 66 (H) 15 - 45 mg/dL  VDRL, CSF     Status: None   Collection Time: 08/19/16  4:19 PM  Result Value Ref Range   VDRL Quant, CSF Non Reactive Non Rea:<1:1    Comment: (NOTE) Performed At: [facility] [place], [place] [id number] Lindon Romp MD [id number]   CSF cell count with differential     Status: Abnormal   Collection Time: 08/19/16  4:19 PM  Result Value Ref Range   Tube # 1    Color, CSF COLORLESS COLORLESS   Appearance, CSF CLEAR CLEAR   Supernatant NOT INDICATED    RBC Count, CSF 24 (H) 0 /cu mm   WBC, CSF 1 0 - 5 /cu mm   Other Cells, CSF TOO FEW TO COUNT, SMEAR AVAILABLE FOR REVIEW     Comment: Few lymphs and monos.  CSF culture     Status: None (Preliminary result)   Collection Time: 08/19/16  4:19 PM  Result Value Ref Range   Specimen Description CSF    Special Requests NONE    Gram Stain      CYTOSPIN SMEAR WBC PRESENT, PREDOMINANTLY MONONUCLEAR NO ORGANISMS SEEN    Culture NO GROWTH < 24 HOURS    Report Status PENDING   Comprehensive metabolic panel     Status: Abnormal   Collection Time: 08/20/16  3:42 AM  Result Value Ref Range   Sodium 140 135 - 145 mmol/L   Potassium 3.5 3.5 - 5.1 mmol/L   Chloride 110 101  - 111 mmol/L   CO2 23 22 - 32 mmol/L   Glucose, Bld 160 (H) 65 - 99 mg/dL   BUN 10 6 - 20 mg/dL   Creatinine, Ser 0.98 0.44 - 1.00 mg/dL   Calcium 7.6 (L) 8.9 - 10.3 mg/dL   Total Protein 4.8 (L) 6.5 - 8.1 g/dL   Albumin 2.7 (L) 3.5 - 5.0 g/dL   AST 16 15 - 41 U/L   ALT 10 (L) 14 - 54 U/L   Alkaline Phosphatase 36 (L) 38 - 126 U/L   Total Bilirubin 0.6 0.3 - 1.2 mg/dL   GFR calc non Af Amer 55 (L) >60 mL/min   GFR calc Af Amer >60 >60 mL/min    Comment: (NOTE) The eGFR has been calculated using the CKD EPI equation. This calculation has not been validated in all clinical situations. eGFR's persistently <60 mL/min signify possible Chronic Kidney Disease.    Anion gap 7 5 - 15  CBC     Status: None  Collection Time: 08/20/16  3:42 AM  Result Value Ref Range   WBC 5.9 4.0 - 10.5 K/uL   RBC 4.48 3.87 - 5.11 MIL/uL   Hemoglobin 12.5 12.0 - 15.0 g/dL   HCT 38.9 36.0 - 46.0 %   MCV 86.8 78.0 - 100.0 fL   MCH 27.9 26.0 - 34.0 pg   MCHC 32.1 30.0 - 36.0 g/dL   RDW 13.9 11.5 - 15.5 %   Platelets 172 150 - 400 K/uL    MICRO:  IMAGING: Ct Head Wo Contrast  Result Date: 08/18/2016 CLINICAL DATA:  Altered mental status today. EXAM: CT HEAD WITHOUT CONTRAST TECHNIQUE: Contiguous axial images were obtained from the base of the skull through the vertex without intravenous contrast. COMPARISON:  Head CT dated 02/11/2016. FINDINGS: Brain: Ventricles are normal in size and configuration. There is no mass, hemorrhage, edema or other evidence of acute parenchymal abnormality. No extra-axial hemorrhage. Mild chronic small vessel ischemic changes noted within the white matter. Vascular: There are chronic calcified atherosclerotic changes of the large vessels at the skull base. No unexpected hyperdense vessel. Skull: Normal. Negative for fracture or focal lesion. Sinuses/Orbits: No acute finding. Other: None. IMPRESSION: No acute findings. No intracranial mass, hemorrhage or edema. Mild chronic small  vessel ischemic changes within the white matter. Electronically Signed   By: Franki Cabot M.D.   On: 08/18/2016 19:18   Dg Chest Port 1 View  Result Date: 08/18/2016 CLINICAL DATA:  Fever and altered mental status EXAM: PORTABLE CHEST 1 VIEW COMPARISON:  02/12/2016 FINDINGS: Right lung is grossly clear. Slightly elevated left diaphragm with minimal left basilar atelectasis. No focal consolidation. There is mild cardiomegaly. Aortic atherosclerosis. No pneumothorax. IMPRESSION: Cardiomegaly with minimal left basilar atelectasis. Electronically Signed   By: Donavan Foil M.D.   On: 08/18/2016 21:58   Dg Fluoro Guide Lumbar Puncture  Result Date: 08/19/2016 CLINICAL DATA:  Severe headache. EXAM: DIAGNOSTIC LUMBAR PUNCTURE UNDER FLUOROSCOPIC GUIDANCE FLUOROSCOPY TIME:  Fluoroscopy Time:  0 minutes and 12 seconds Radiation Exposure Index (if provided by the fluoroscopic device): N/A Number of Acquired Spot Images: 0 PROCEDURE: Informed consent was obtained from the patient prior to the procedure, including potential complications of headache, allergy, and pain. With the patient prone, the lower back was prepped with Betadine. 1% Lidocaine was used for local anesthesia. Lumbar puncture was performed at the L4-5 level using a 20 gauge needle with return of clear CSF. 8.0 ml of CSF were obtained for laboratory studies. The patient tolerated the procedure well and there were no apparent complications. IMPRESSION: Fluoroscopic guided lumbar puncture with clear CSF sent for appropriate laboratory evaluation. Patient tolerated the procedure well without immediate complications. Electronically Signed   By: Marijo Sanes M.D.   On: 08/19/2016 16:31    Assessment/Plan:   Mrs. Moffitt is a 76yo woman with hx of migraines who presented with an intractable headache who then experienced a brief episode of altered mental status in setting of fever. Her clinical picture seems most consistent with a complicated migraine.    Acute encephalopathy: Completely resolved. No evidence for meningitis on LP. Coagulase neg staph on blood cx most likely a contaminant. No concern for tick borne illness as this is not consistent with her clinical picture. Would recommend to discontinue all antibiotics.   Positive blood cx: This most likely represents a contaminant given the culture is coagulase negative staph.   Attending note to follow.  Albin Felling, MD, MPH Internal Medicine Resident, PGY-3

## 2016-08-20 NOTE — Progress Notes (Signed)
Subjective: Lumbar puncture performed yesterday.   Objective: Current vital signs: BP (!) 120/58 (BP Location: Left Arm)   Pulse 62   Temp 98.9 F (37.2 C) (Oral)   Resp 16   Ht _0  (1.575 m)   Wt 89.8 kg (198 lb)   SpO2 98%   BMI 36.21 kg/m  Vital signs in last 24 hours: Temp:  [97.5 F (36.4 C)-98.9 F (37.2 C)] 98.9 F (37.2 C) (06/13 0144) Pulse Rate:  [57-79] 62 (06/13 0554) Resp:  [16-19] 16 (06/13 0554) BP: (112-160)/(43-67) 120/58 (06/13 0554) SpO2:  [98 %-100 %] 98 % (06/13 0554)  Intake/Output from previous day: 06/12 0701 - 06/13 0700 In: 4530 [P.O.:280; I.V.:3000; IV Piggyback:1250] Out: 705 [Urine:705] Intake/Output this shift: No intake/output data recorded. Nutritional status: Diet regular Room service appropriate? Yes; Fluid consistency: Thin  Neurologic Exam: Ment: Alert and fully oriented. Pleasant and cooperative. Speech is fluent without evidence for aphasia.  CN: EOMI. Face symmetric. Unchanged.  Motor: Unremarkable.  Lab Results: Results for orders placed or performed during the hospital encounter of 08/18/16 (from the past 48 hour(s))  Ethanol     Status: None   Collection Time: 08/18/16  7:01 PM  Result Value Ref Range   Alcohol, Ethyl (B) <5 <5 mg/dL    Comment:        LOWEST DETECTABLE LIMIT FOR SERUM ALCOHOL IS 5 mg/dL FOR MEDICAL PURPOSES ONLY   I-stat troponin, ED (not at [facility], [facility])     Status: None   Collection Time: 08/18/16  7:01 PM  Result Value Ref Range   Troponin i, poc 0.00 0.00 - 0.08 ng/mL   Comment 3            Comment: Due to the release kinetics of cTnI, a negative result within the first hours of the onset of symptoms does not rule out myocardial infarction with certainty. If myocardial infarction is still suspected, repeat the test at appropriate intervals.   I-Stat Chem 8, ED  (not at [facility], [facility])     Status: Abnormal   Collection Time: 08/18/16  7:03 PM  Result Value Ref Range   Sodium 137 135 - 145 mmol/L    Potassium 4.1 3.5 - 5.1 mmol/L   Chloride 101 101 - 111 mmol/L   BUN 19 6 - 20 mg/dL   Creatinine, Ser 1.10 (H) 0.44 - 1.00 mg/dL   Glucose, Bld 124 (H) 65 - 99 mg/dL   Calcium, Ion 1.01 (L) 1.15 - 1.40 mmol/L   TCO2 24 0 - 100 mmol/L   Hemoglobin 15.6 (H) 12.0 - 15.0 g/dL   HCT 46.0 36.0 - 46.0 %  Protime-INR     Status: None   Collection Time: 08/18/16  7:04 PM  Result Value Ref Range   Prothrombin Time 12.6 11.4 - 15.2 seconds   INR 0.94   APTT     Status: None   Collection Time: 08/18/16  7:04 PM  Result Value Ref Range   aPTT 27 24 - 36 seconds  CBC     Status: Abnormal   Collection Time: 08/18/16  7:04 PM  Result Value Ref Range   WBC 11.0 (H) 4.0 - 10.5 K/uL   RBC 5.44 (H) 3.87 - 5.11 MIL/uL   Hemoglobin 15.3 (H) 12.0 - 15.0 g/dL   HCT 45.5 36.0 - 46.0 %   MCV 83.6 78.0 - 100.0 fL   MCH 28.1 26.0 - 34.0 pg   MCHC 33.6 30.0 - 36.0 g/dL  RDW 13.2 11.5 - 15.5 %   Platelets 233 150 - 400 K/uL  Differential     Status: Abnormal   Collection Time: 08/18/16  7:04 PM  Result Value Ref Range   Neutrophils Relative % 87 %   Neutro Abs 9.6 (H) 1.7 - 7.7 K/uL   Lymphocytes Relative 10 %   Lymphs Abs 1.1 0.7 - 4.0 K/uL   Monocytes Relative 3 %   Monocytes Absolute 0.3 0.1 - 1.0 K/uL   Eosinophils Relative 0 %   Eosinophils Absolute 0.0 0.0 - 0.7 K/uL   Basophils Relative 0 %   Basophils Absolute 0.0 0.0 - 0.1 K/uL  Comprehensive metabolic panel     Status: Abnormal   Collection Time: 08/18/16  7:04 PM  Result Value Ref Range   Sodium 136 135 - 145 mmol/L   Potassium 4.1 3.5 - 5.1 mmol/L   Chloride 101 101 - 111 mmol/L   CO2 23 22 - 32 mmol/L   Glucose, Bld 126 (H) 65 - 99 mg/dL   BUN 16 6 - 20 mg/dL   Creatinine, Ser 1.15 (H) 0.44 - 1.00 mg/dL   Calcium 8.9 8.9 - 10.3 mg/dL   Total Protein 6.8 6.5 - 8.1 g/dL   Albumin 4.1 3.5 - 5.0 g/dL   AST 28 15 - 41 U/L   ALT 16 14 - 54 U/L   Alkaline Phosphatase 54 38 - 126 U/L   Total Bilirubin 1.6 (H) 0.3 - 1.2 mg/dL    GFR calc non Af Amer 45 (L) >60 mL/min   GFR calc Af Amer 53 (L) >60 mL/min    Comment: (NOTE) The eGFR has been calculated using the CKD EPI equation. This calculation has not been validated in all clinical situations. eGFR's persistently <60 mL/min signify possible Chronic Kidney Disease.    Anion gap 12 5 - 15  Procalcitonin     Status: None   Collection Time: 08/18/16  7:04 PM  Result Value Ref Range   Procalcitonin <0.10 ng/mL    Comment:        Interpretation: PCT (Procalcitonin) <= 0.5 ng/mL: Systemic infection (sepsis) is not likely. Local bacterial infection is possible. (NOTE)         ICU PCT Algorithm               Non ICU PCT Algorithm    ----------------------------     ------------------------------         PCT < 0.25 ng/mL                 PCT < 0.1 ng/mL     Stopping of antibiotics            Stopping of antibiotics       strongly encouraged.               strongly encouraged.    ----------------------------     ------------------------------       PCT level decrease by               PCT < 0.25 ng/mL       >= 80% from peak PCT       OR PCT 0.25 - 0.5 ng/mL          Stopping of antibiotics  encouraged.     Stopping of antibiotics           encouraged.    ----------------------------     ------------------------------       PCT level decrease by              PCT >= 0.25 ng/mL       < 80% from peak PCT        AND PCT >= 0.5 ng/mL            Continuin g antibiotics                                              encouraged.       Continuing antibiotics            encouraged.    ----------------------------     ------------------------------     PCT level increase compared          PCT > 0.5 ng/mL         with peak PCT AND          PCT >= 0.5 ng/mL             Escalation of antibiotics                                          strongly encouraged.      Escalation of antibiotics        strongly encouraged.   Urine rapid  drug screen (hosp performed)not at [facility]     Status: None   Collection Time: 08/18/16  8:13 PM  Result Value Ref Range   Opiates NONE DETECTED NONE DETECTED   Cocaine NONE DETECTED NONE DETECTED   Benzodiazepines NONE DETECTED NONE DETECTED   Amphetamines NONE DETECTED NONE DETECTED   Tetrahydrocannabinol NONE DETECTED NONE DETECTED   Barbiturates NONE DETECTED NONE DETECTED    Comment:        DRUG SCREEN FOR MEDICAL PURPOSES ONLY.  IF CONFIRMATION IS NEEDED FOR ANY PURPOSE, NOTIFY LAB WITHIN 5 DAYS.        LOWEST DETECTABLE LIMITS FOR URINE DRUG SCREEN Drug Class       Cutoff (ng/mL) Amphetamine      1000 Barbiturate      200 Benzodiazepine   109 Tricyclics       323 Opiates          300 Cocaine          300 THC              50   Urinalysis, Routine w reflex microscopic     Status: Abnormal   Collection Time: 08/18/16  8:13 PM  Result Value Ref Range   Color, Urine YELLOW YELLOW   APPearance CLEAR CLEAR   Specific Gravity, Urine 1.016 1.005 - 1.030   pH 7.0 5.0 - 8.0   Glucose, UA NEGATIVE NEGATIVE mg/dL   Hgb urine dipstick NEGATIVE NEGATIVE   Bilirubin Urine NEGATIVE NEGATIVE   Ketones, ur 20 (A) NEGATIVE mg/dL   Protein, ur NEGATIVE NEGATIVE mg/dL   Nitrite NEGATIVE NEGATIVE   Leukocytes, UA NEGATIVE NEGATIVE  I-Stat arterial blood gas, ED     Status: Abnormal   Collection Time: 08/18/16  9:38 PM  Result Value Ref  Range   pH, Arterial 7.457 (H) 7.350 - 7.450   pCO2 arterial 35.1 32.0 - 48.0 mmHg   pO2, Arterial 77.0 (L) 83.0 - 108.0 mmHg   Bicarbonate 24.5 20.0 - 28.0 mmol/L   TCO2 26 0 - 100 mmol/L   O2 Saturation 95.0 %   Acid-Base Excess 1.0 0.0 - 2.0 mmol/L   Patient temperature 100.7 F    Collection site RADIAL, ALLEN'S TEST ACCEPTABLE    Drawn by RT    Sample type ARTERIAL   Culture, blood (routine x 2)     Status: None (Preliminary result)   Collection Time: 08/18/16  9:48 PM  Result Value Ref Range   Specimen Description BLOOD RIGHT HAND    Special  Requests IN PEDIATRIC BOTTLE Blood Culture adequate volume    Culture NO GROWTH < 24 HOURS    Report Status PENDING   Culture, blood (routine x 2)     Status: None (Preliminary result)   Collection Time: 08/18/16 10:08 PM  Result Value Ref Range   Specimen Description BLOOD RIGHT WRIST    Special Requests      BOTTLES DRAWN AEROBIC AND ANAEROBIC Blood Culture adequate volume   Culture  Setup Time      GRAM POSITIVE COCCI IN CLUSTERS AEROBIC BOTTLE ONLY CRITICAL RESULT CALLED TO, READ BACK BY AND VERIFIED WITH: T. DANG PHARM 08/19/16 2004 BEAMJ    Culture GRAM POSITIVE COCCI    Report Status PENDING   Blood Culture ID Panel (Reflexed)     Status: Abnormal   Collection Time: 08/18/16 10:08 PM  Result Value Ref Range   Enterococcus species NOT DETECTED NOT DETECTED   Listeria monocytogenes NOT DETECTED NOT DETECTED   Staphylococcus species DETECTED (A) NOT DETECTED    Comment: Methicillin (oxacillin) susceptible coagulase negative staphylococcus. Possible blood culture contaminant (unless isolated from more than one blood culture draw or clinical case suggests pathogenicity). No antibiotic treatment is indicated for blood  culture contaminants. CRITICAL RESULT CALLED TO, READ BACK BY AND VERIFIED WITH: T. DANG PHARM 08/19/16 2004 BEAMJ    Staphylococcus aureus NOT DETECTED NOT DETECTED   Methicillin resistance NOT DETECTED NOT DETECTED   Streptococcus species NOT DETECTED NOT DETECTED   Streptococcus agalactiae NOT DETECTED NOT DETECTED   Streptococcus pneumoniae NOT DETECTED NOT DETECTED   Streptococcus pyogenes NOT DETECTED NOT DETECTED   Acinetobacter baumannii NOT DETECTED NOT DETECTED   Enterobacteriaceae species NOT DETECTED NOT DETECTED   Enterobacter cloacae complex NOT DETECTED NOT DETECTED   Escherichia coli NOT DETECTED NOT DETECTED   Klebsiella oxytoca NOT DETECTED NOT DETECTED   Klebsiella pneumoniae NOT DETECTED NOT DETECTED   Proteus species NOT DETECTED NOT  DETECTED   Serratia marcescens NOT DETECTED NOT DETECTED   Haemophilus influenzae NOT DETECTED NOT DETECTED   Neisseria meningitidis NOT DETECTED NOT DETECTED   Pseudomonas aeruginosa NOT DETECTED NOT DETECTED   Candida albicans NOT DETECTED NOT DETECTED   Candida glabrata NOT DETECTED NOT DETECTED   Candida krusei NOT DETECTED NOT DETECTED   Candida parapsilosis NOT DETECTED NOT DETECTED   Candida tropicalis NOT DETECTED NOT DETECTED  I-Stat CG4 Lactic Acid, ED     Status: Abnormal   Collection Time: 08/18/16 10:15 PM  Result Value Ref Range   Lactic Acid, Venous 4.70 (HH) 0.5 - 1.9 mmol/L   Comment NOTIFIED PHYSICIAN   I-Stat CG4 Lactic Acid, ED     Status: Abnormal   Collection Time: 08/19/16  1:12 AM  Result Value Ref Range  Lactic Acid, Venous 2.74 (HH) 0.5 - 1.9 mmol/L   Comment NOTIFIED PHYSICIAN   Lactic acid, plasma     Status: Abnormal   Collection Time: 08/19/16  2:07 AM  Result Value Ref Range   Lactic Acid, Venous 2.2 (HH) 0.5 - 1.9 mmol/L    Comment: CRITICAL RESULT CALLED TO, READ BACK BY AND VERIFIED WITH: J.FRANSISCO,RN [id number] 08/19/16 M.CAMPBELL   I-Stat CG4 Lactic Acid, ED     Status: None   Collection Time: 08/19/16  4:08 AM  Result Value Ref Range   Lactic Acid, Venous 1.05 0.5 - 1.9 mmol/L  Glucose, CSF     Status: None   Collection Time: 08/19/16  4:19 PM  Result Value Ref Range   Glucose, CSF 64 40 - 70 mg/dL  Protein, CSF     Status: Abnormal   Collection Time: 08/19/16  4:19 PM  Result Value Ref Range   Total  Protein, CSF 66 (H) 15 - 45 mg/dL  CSF cell count with differential     Status: Abnormal   Collection Time: 08/19/16  4:19 PM  Result Value Ref Range   Tube # 1    Color, CSF COLORLESS COLORLESS   Appearance, CSF CLEAR CLEAR   Supernatant NOT INDICATED    RBC Count, CSF 24 (H) 0 /cu mm   WBC, CSF 1 0 - 5 /cu mm   Other Cells, CSF TOO FEW TO COUNT, SMEAR AVAILABLE FOR REVIEW     Comment: Few lymphs and monos.  CSF culture     Status:  None (Preliminary result)   Collection Time: 08/19/16  4:19 PM  Result Value Ref Range   Specimen Description CSF    Special Requests NONE    Gram Stain      CYTOSPIN SMEAR WBC PRESENT, PREDOMINANTLY MONONUCLEAR NO ORGANISMS SEEN    Culture PENDING    Report Status PENDING   Comprehensive metabolic panel     Status: Abnormal   Collection Time: 08/20/16  3:42 AM  Result Value Ref Range   Sodium 140 135 - 145 mmol/L   Potassium 3.5 3.5 - 5.1 mmol/L   Chloride 110 101 - 111 mmol/L   CO2 23 22 - 32 mmol/L   Glucose, Bld 160 (H) 65 - 99 mg/dL   BUN 10 6 - 20 mg/dL   Creatinine, Ser 0.98 0.44 - 1.00 mg/dL   Calcium 7.6 (L) 8.9 - 10.3 mg/dL   Total Protein 4.8 (L) 6.5 - 8.1 g/dL   Albumin 2.7 (L) 3.5 - 5.0 g/dL   AST 16 15 - 41 U/L   ALT 10 (L) 14 - 54 U/L   Alkaline Phosphatase 36 (L) 38 - 126 U/L   Total Bilirubin 0.6 0.3 - 1.2 mg/dL   GFR calc non Af Amer 55 (L) >60 mL/min   GFR calc Af Amer >60 >60 mL/min    Comment: (NOTE) The eGFR has been calculated using the CKD EPI equation. This calculation has not been validated in all clinical situations. eGFR's persistently <60 mL/min signify possible Chronic Kidney Disease.    Anion gap 7 5 - 15  CBC     Status: None   Collection Time: 08/20/16  3:42 AM  Result Value Ref Range   WBC 5.9 4.0 - 10.5 K/uL   RBC 4.48 3.87 - 5.11 MIL/uL   Hemoglobin 12.5 12.0 - 15.0 g/dL   HCT 38.9 36.0 - 46.0 %   MCV 86.8 78.0 - 100.0 fL   MCH 27.9  26.0 - 34.0 pg   MCHC 32.1 30.0 - 36.0 g/dL   RDW 13.9 11.5 - 15.5 %   Platelets 172 150 - 400 K/uL    Recent Results (from the past 240 hour(s))  Culture, blood (routine x 2)     Status: None (Preliminary result)   Collection Time: 08/18/16  9:48 PM  Result Value Ref Range Status   Specimen Description BLOOD RIGHT HAND  Final   Special Requests IN PEDIATRIC BOTTLE Blood Culture adequate volume  Final   Culture NO GROWTH < 24 HOURS  Final   Report Status PENDING  Incomplete  Culture, blood  (routine x 2)     Status: None (Preliminary result)   Collection Time: 08/18/16 10:08 PM  Result Value Ref Range Status   Specimen Description BLOOD RIGHT WRIST  Final   Special Requests   Final    BOTTLES DRAWN AEROBIC AND ANAEROBIC Blood Culture adequate volume   Culture  Setup Time   Final    GRAM POSITIVE COCCI IN CLUSTERS AEROBIC BOTTLE ONLY CRITICAL RESULT CALLED TO, READ BACK BY AND VERIFIED WITH: T. DANG PHARM 08/19/16 2004 BEAMJ    Culture GRAM POSITIVE COCCI  Final   Report Status PENDING  Incomplete  Blood Culture ID Panel (Reflexed)     Status: Abnormal   Collection Time: 08/18/16 10:08 PM  Result Value Ref Range Status   Enterococcus species NOT DETECTED NOT DETECTED Final   Listeria monocytogenes NOT DETECTED NOT DETECTED Final   Staphylococcus species DETECTED (A) NOT DETECTED Final    Comment: Methicillin (oxacillin) susceptible coagulase negative staphylococcus. Possible blood culture contaminant (unless isolated from more than one blood culture draw or clinical case suggests pathogenicity). No antibiotic treatment is indicated for blood  culture contaminants. CRITICAL RESULT CALLED TO, READ BACK BY AND VERIFIED WITH: T. DANG PHARM 08/19/16 2004 BEAMJ    Staphylococcus aureus NOT DETECTED NOT DETECTED Final   Methicillin resistance NOT DETECTED NOT DETECTED Final   Streptococcus species NOT DETECTED NOT DETECTED Final   Streptococcus agalactiae NOT DETECTED NOT DETECTED Final   Streptococcus pneumoniae NOT DETECTED NOT DETECTED Final   Streptococcus pyogenes NOT DETECTED NOT DETECTED Final   Acinetobacter baumannii NOT DETECTED NOT DETECTED Final   Enterobacteriaceae species NOT DETECTED NOT DETECTED Final   Enterobacter cloacae complex NOT DETECTED NOT DETECTED Final   Escherichia coli NOT DETECTED NOT DETECTED Final   Klebsiella oxytoca NOT DETECTED NOT DETECTED Final   Klebsiella pneumoniae NOT DETECTED NOT DETECTED Final   Proteus species NOT DETECTED NOT  DETECTED Final   Serratia marcescens NOT DETECTED NOT DETECTED Final   Haemophilus influenzae NOT DETECTED NOT DETECTED Final   Neisseria meningitidis NOT DETECTED NOT DETECTED Final   Pseudomonas aeruginosa NOT DETECTED NOT DETECTED Final   Candida albicans NOT DETECTED NOT DETECTED Final   Candida glabrata NOT DETECTED NOT DETECTED Final   Candida krusei NOT DETECTED NOT DETECTED Final   Candida parapsilosis NOT DETECTED NOT DETECTED Final   Candida tropicalis NOT DETECTED NOT DETECTED Final  CSF culture     Status: None (Preliminary result)   Collection Time: 08/19/16  4:19 PM  Result Value Ref Range Status   Specimen Description CSF  Final   Special Requests NONE  Final   Gram Stain   Final    CYTOSPIN SMEAR WBC PRESENT, PREDOMINANTLY MONONUCLEAR NO ORGANISMS SEEN    Culture PENDING  Incomplete   Report Status PENDING  Incomplete    Lipid Panel No results for  input(s): CHOL, TRIG, HDL, CHOLHDL, VLDL, LDLCALC in the last 72 hours.  Studies/Results: Ct Head Wo Contrast  Result Date: 08/18/2016 CLINICAL DATA:  Altered mental status today. EXAM: CT HEAD WITHOUT CONTRAST TECHNIQUE: Contiguous axial images were obtained from the base of the skull through the vertex without intravenous contrast. COMPARISON:  Head CT dated 02/11/2016. FINDINGS: Brain: Ventricles are normal in size and configuration. There is no mass, hemorrhage, edema or other evidence of acute parenchymal abnormality. No extra-axial hemorrhage. Mild chronic small vessel ischemic changes noted within the white matter. Vascular: There are chronic calcified atherosclerotic changes of the large vessels at the skull base. No unexpected hyperdense vessel. Skull: Normal. Negative for fracture or focal lesion. Sinuses/Orbits: No acute finding. Other: None. IMPRESSION: No acute findings. No intracranial mass, hemorrhage or edema. Mild chronic small vessel ischemic changes within the white matter. Electronically Signed   By: Franki Cabot M.D.   On: 08/18/2016 19:18   Dg Chest Port 1 View  Result Date: 08/18/2016 CLINICAL DATA:  Fever and altered mental status EXAM: PORTABLE CHEST 1 VIEW COMPARISON:  02/12/2016 FINDINGS: Right lung is grossly clear. Slightly elevated left diaphragm with minimal left basilar atelectasis. No focal consolidation. There is mild cardiomegaly. Aortic atherosclerosis. No pneumothorax. IMPRESSION: Cardiomegaly with minimal left basilar atelectasis. Electronically Signed   By: Donavan Foil M.D.   On: 08/18/2016 21:58   Dg Fluoro Guide Lumbar Puncture  Result Date: 08/19/2016 CLINICAL DATA:  Severe headache. EXAM: DIAGNOSTIC LUMBAR PUNCTURE UNDER FLUOROSCOPIC GUIDANCE FLUOROSCOPY TIME:  Fluoroscopy Time:  0 minutes and 12 seconds Radiation Exposure Index (if provided by the fluoroscopic device): N/A Number of Acquired Spot Images: 0 PROCEDURE: Informed consent was obtained from the patient prior to the procedure, including potential complications of headache, allergy, and pain. With the patient prone, the lower back was prepped with Betadine. 1% Lidocaine was used for local anesthesia. Lumbar puncture was performed at the L4-5 level using a 20 gauge needle with return of clear CSF. 8.0 ml of CSF were obtained for laboratory studies. The patient tolerated the procedure well and there were no apparent complications. IMPRESSION: Fluoroscopic guided lumbar puncture with clear CSF sent for appropriate laboratory evaluation. Patient tolerated the procedure well without immediate complications. Electronically Signed   By: Marijo Sanes M.D.   On: 08/19/2016 16:31    Medications:  Scheduled: . [START ON 08/21/2016] aspirin EC  81 mg Oral Once per day on Mon Thu  . enoxaparin (LOVENOX) injection  40 mg Subcutaneous Q24H  . metoprolol tartrate  25 mg Oral BID  . pantoprazole  40 mg Oral BID AC  . pravastatin  20 mg Oral QPM   Continuous: . sodium chloride 125 mL/hr at 08/20/16 0641  . ampicillin (OMNIPEN) IV  2 g (08/20/16 0818)  . cefTRIAXone (ROCEPHIN)  IV Stopped (08/20/16 0042)  . doxycycline (VIBRAMYCIN) IV Stopped (08/20/16 0356)  . vancomycin Stopped (08/20/16 0155)   [id number] **OR** acetaminophen (TYLENOL) oral liquid 160 mg/5 mL **OR** acetaminophen  Assessment: 76 year old female presenting with symptoms that are most consistent with complicated migraine 1. Headache pain and confusion have not recurred, although patient states that "it does not feel right" along the posterior aspect of her head and neck.   2. Neurological examination is normal. No changes seen since yesterday.   3. CSF reveals mildly elevated protein of 66. CSF glucose and WBC were normal. No organisms seen on initial cytospin smear. CSF culture is pending. DDx for this in the context  of her clinical presentation includes a mild autoimmune encephalopathy, seizure (lactate was elevated on admission) and small subarachnoid hemorrhage.   4. On empiric ABX with CSF culture peinding. Now even lower probability that this is a meningitis, given normal CSF WBC count of 1. 5. On doxycycline for possible tick-borne illness.    Recommendations: 1. Obtain ID consult to determine if we can discontinue empiric menintitis-dose ABX.  2. MRI brain with and without contrast (ordered).  3. MRA head (ordered).  4. EEG (ordered).  5. If MRI brain negative for hemorrhage, will consider a 3-5 day course of pulsed-dose steroids for possible mild autoimmune encephalopathy.  6. CSF and serum lyme titers (ordered).  7. Discussed with patient, who expressed understanding and agreement with the plan.    LOS: 1 day   _0  signed: Dr. Kerney Elbe 08/20/2016  9:57 AM

## 2016-08-21 ENCOUNTER — Inpatient Hospital Stay (HOSPITAL_COMMUNITY): Payer: Medicare Other

## 2016-08-21 DIAGNOSIS — G9340 Encephalopathy, unspecified: Secondary | ICD-10-CM

## 2016-08-21 LAB — CULTURE, BLOOD (ROUTINE X 2): SPECIAL REQUESTS: ADEQUATE

## 2016-08-21 LAB — B. BURGDORFI ANTIBODIES: B burgdorferi Ab IgG+IgM: <0.91 {ISR} (ref 0.00–0.90)

## 2016-08-21 LAB — RMSF, IGG, IFA

## 2016-08-21 LAB — ROCKY MTN SPOTTED FVR ABS PNL(IGG+IGM)
RMSF IGG: POSITIVE — AB
RMSF IGM: 0.53 {index} (ref 0.00–0.89)

## 2016-08-21 MED ORDER — HYDROCHLOROTHIAZIDE 25 MG PO TABS
25.0000 mg | ORAL_TABLET | Freq: Every day | ORAL | Status: DC
  Administered 2016-08-21: 25 mg via ORAL
  Filled 2016-08-21: qty 1

## 2016-08-21 NOTE — Progress Notes (Signed)
Discharge orders received.  Discharge instructions and follow-up appointments reviewed with the patient and her friend.  VSS upon discharge.  IV removed and education complete.  All belongings sent with the patient.  Transported out via wheelchair.   Cori Razor, RN

## 2016-08-21 NOTE — Progress Notes (Signed)
EEG Completed; Results Pending  

## 2016-08-21 NOTE — Procedures (Signed)
ELECTROENCEPHALOGRAM REPORT  Date of Study: 08/21/2016  Patient's Name: [name] MRN: [id number] Date of Birth: [birth year]  Referring Provider: Dr. Kerney Elbe  Clinical History: This is a 76 year old woman with increased somnolence and lethargy.  Medications: Outpatient Medications  albuterol (PROVENTIL HFA;VENTOLIN HFA) 108 (90 Base) MCG/ACT inhaler   aspirin EC 81 MG tablet   calcium carbonate (TUMS EX) 750 MG chewable tablet   Cyanocobalamin (VITAMIN B-12 IJ)   fluticasone (FLONASE) 50 MCG/ACT nasal spray   hydrochlorothiazide (HYDRODIURIL) 25 MG tablet   lisinopril (PRINIVIL,ZESTRIL) 10 MG tablet   loratadine (CLARITIN) 10 MG tablet   metoprolol (LOPRESSOR) 50 MG tablet   pantoprazole (PROTONIX) 40 MG tablet   polyethylene glycol (MIRALAX / GLYCOLAX) packet   pravastatin (PRAVACHOL) 20 MG tablet(Expired)   promethazine (PHENERGAN) 25 MG tablet   SUMAtriptan (IMITREX) 100 MG tablet   Technical Summary: A multichannel digital EEG recording measured by the international 10-20 system with electrodes applied with paste and impedances below 5000 ohms performed in our laboratory with EKG monitoring in an awake and drowsy patient.  Hyperventilation and photic stimulation were not performed.  The digital EEG was referentially recorded, reformatted, and digitally filtered in a variety of bipolar and referential montages for optimal display.    Description: The patient is awake and drowsy during the recording.  During maximal wakefulness, there is a symmetric, medium voltage 8-9 Hz posterior dominant rhythm that attenuates with eye opening.  The record is symmetric.  During drowsiness, there is an increase in theta slowing of the background, with shifting asymmetry seen over the bilateral temporal regions. Deeper stages of sleep were not seen.Hyperventilation and photic stimulation were not performed.  There were no epileptiform discharges or electrographic seizures seen.    EKG  lead was unremarkable.  Impression: This awake and drowsy EEG is within normal limits for age.  Clinical Correlation: A normal EEG does not exclude a clinical diagnosis of epilepsy. Clinical correlation is advised.   Ellouise Newer, M.D.

## 2016-08-21 NOTE — Discharge Instructions (Signed)
[name],  There was concern for a serious infection causing sepsis with additional concern for meningitis. Workup has rvealed that you do not appear to have an active bacterial infection requiring antibiotics but rather, likely suffered a combination of migraine headache, in addition to over-medication and possible viral illness. Please follow-up with your primary care physician and your neurologist.

## 2016-08-21 NOTE — Discharge Summary (Signed)
Physician Discharge Summary  [name] DQQ:[id number] DOB: 1940/06/27 DOA: 08/18/2016  PCP: [name], MD  Admit date: 08/18/2016 Discharge date: 08/21/2016  Admitted From: Home Disposition: Home  Recommendations for Outpatient Follow-up:  1. Follow up with PCP in 1 week 2. Follow up with cardiology 3. Please follow up on the following pending results: RMSF, Lyme titer, blood culture, CSF culture  Home Health: None Equipment/Devices: None  Discharge Condition: Stable CODE STATUS: Full code Diet recommendation: Heart healthy   Brief/Interim Summary:  Admission HPI written by Lily Kocher, MD   HPI: [name] is a 76 y.o. woman with a history of RA, severe scoliosis, chronic back pain, lumbar laminectomy, GERD with esophageal stricture, DVT in 2013, HTN, HLD, and migraine headaches with aura who presented to Dr. Jannifer Franklin' office today for management of intractable headache, presentation consistent with prior episodes of migraine headaches for her.  Headache was refractory to two imitrex tablets at home.  In the office, she received 1 gram of depacon, 30mg  of IV toradol, 25mg  of IV phenergan, and 500cc of NS.  Headache was refractory and she was referred to the ED for further evaluation and management.  ED Course: The patient received IV fentanyl 79mcg and 4mg  of IV zofran in the ED.  Headache resolved, but she continued to demonstrate increased somnolence and lethargy.  Hospitalist was asked to admit the patient.  At the time of my initial assessment, the patient was altered but following some commands.  She resisted by attempts to open her eyes.  She did not have evidence of adequate judgment or insight.  Her husband had already gone home and did not answer at either number listed on the facesheet.  She had a mild leukocytosis of 11.  Hgb 15.  Ketones in her urine but no evidence of infection.  Head CT negative for acute process.  EKG negative for acute changes.   Troponin negative.  The initial plan was to place the patient in observation with neurochecks, hydrate, and repeat assessment in the AM.  Mental status was attributed to polypharmacy.  However, I was approached directly by the ED attending about one hour after my initial assessment and advised that the patient was awake and alert, following commands, and able to give her own clinical history.  She also developed a fever to 100.7 (rectal).  The patient was able to give a history of tick bite to her right leg on Friday while working outside in her yard.  She pulled the tick off, wrapped it up, and put it in her purse.  She had not had any fever or sweats before tonight.  She had one episode of chills that she did not think much of.  She had mild nausea today which she attributed to her migraine headache.  No rash. No diarrhea.  No chest pain.  No shortness of breath.  No swelling.  She has congestion which she attributes to allergies.  After fever was found, the ED attending ordered a lactic acid level which was elevated at 4.7.  Code Sepsis was activated.  The patient received 30cc/kg of NS.  She also received empiric Rocephin, vanc, and doxycycline.  Blood cultures were obtained and antibodies for RMSF, Borrelia, and Ehrlichia were ordered.  Chest xray showed cardiomegaly with minimal bibasilar atelectasis.  LP was offered in the ED and discussed with the patient by me, as well as the ED attending.  Risks vs benefits of the procedure were reviewed at length.  Patient's questions were answered to her apparent satisfaction.  She ultimately declined bedside LP in the ED; she would rather wait until the morning to have the procedure done under fluoro.  Admission orders were revised, and ampicillin was added to her empiric coverage per sepsis protocol for possible meningitis.    Hospital course:  Acute encephalopathy Fever Initial concern for meningitis. Patient was started on vancomycin, ceftriaxone,  and ampicillin in addition to doxycycline. CSF studies obtained but do not suggest bacterial meningitis. Infectious disease was consulted for antibiotic management. MRI and MRA were unremarkable for encephalitis. They did not feel that her encephalopathy and fever were secondary to bacterial infection. Encephalopathy was likely secondary to medication side effect. Antibiotics discontinued. Blood culture significant for coag negative staph, which is likely a contaminant. Blood and CSF cultures pending. RMSF pending. Lyme pending. Resolved now. Initial concern of meningitis. MRI was concerning for subacute infarct, which was refuted by neurology. EEG without evidence for seizure activity.  Intractable migraine headache No CT evidence for etiology. Resolved.  Hyperlipidemia Continued Pravachol  GERD Continued PPI  Essential hypertension Blood pressure labile. Lisinopril held on admission. Continued metoprolol and restarted lisinopril on discharge.  Sepsis Patient subsequently on admission. No source of infection however on workup. Ruled out.   Discharge Diagnoses:  Principal Problem:   Acute encephalopathy Active Problems:   GERD   Acute intractable headache   Obesity   Sepsis [facility])    Discharge Instructions  Discharge Instructions    Call MD for:  difficulty breathing, headache or visual disturbances    Complete by:  As directed    Call MD for:  persistant dizziness or light-headedness    Complete by:  As directed    Call MD for:  persistant nausea and vomiting    Complete by:  As directed    Call MD for:  severe uncontrolled pain    Complete by:  As directed    Call MD for:  temperature >100.4    Complete by:  As directed    Diet - low sodium heart healthy    Complete by:  As directed    Increase activity slowly    Complete by:  As directed      Allergies as of 08/21/2016      Reactions   Reglan [metoclopramide] Other (See Comments)   Speech is garbled, extreme  trouble making words      Medication List    TAKE these medications   albuterol 108 (90 Base) MCG/ACT inhaler Commonly known as:  PROVENTIL HFA;VENTOLIN HFA Inhale 2 puffs into the lungs every 6 (six) hours as needed for wheezing or shortness of breath.   aspirin EC 81 MG tablet Take 81 mg by mouth 2 (two) times a week.   calcium carbonate 750 MG chewable tablet Commonly known as:  TUMS EX Chew 2 tablets by mouth daily as needed for heartburn.   fluticasone 50 MCG/ACT nasal spray Commonly known as:  FLONASE Place 1 spray into both nostrils daily as needed for allergies.   hydrochlorothiazide 25 MG tablet Commonly known as:  HYDRODIURIL Take 1 tablet (25 mg total) by mouth daily.   lisinopril 10 MG tablet Commonly known as:  PRINIVIL,ZESTRIL Take 1 tablet (10 mg total) by mouth every evening.   loratadine 10 MG tablet Commonly known as:  CLARITIN Take 10 mg by mouth daily as needed for allergies.   metoprolol tartrate 50 MG tablet Commonly known as:  LOPRESSOR 1/2 tablet (25 mg) two times  a day What changed:  how much to take  how to take this  when to take this  additional instructions   pantoprazole 40 MG tablet Commonly known as:  PROTONIX Take 1 tablet (40 mg total) by mouth 2 (two) times daily before a meal.   polyethylene glycol packet Commonly known as:  MIRALAX / GLYCOLAX Take 17 g by mouth daily. What changed:  when to take this  reasons to take this   pravastatin 20 MG tablet Commonly known as:  PRAVACHOL Take 1 tablet (20 mg total) by mouth every evening.   promethazine 25 MG tablet Commonly known as:  PHENERGAN Take 1 tablet (25 mg total) by mouth every 6 (six) hours as needed for nausea or vomiting.   SUMAtriptan 100 MG tablet Commonly known as:  IMITREX Take 1 tablet (100 mg total) by mouth 2 (two) times daily as needed for migraine.   VITAMIN B-12 IJ Inject as directed every 30 (thirty) days.      Follow-up Information     Rankins, Bill Salinas, MD. Schedule an appointment as soon as possible for a visit in 1 week(s).   Specialty:  Family Medicine Contact information: [place] [address] [phone]        Larey Dresser, MD Follow up.   Specialty:  Cardiology Contact information: [address] [place] [address] [phone]          Allergies  Allergen Reactions  . Reglan [Metoclopramide] Other (See Comments)    Speech is garbled, extreme trouble making words    Consultations:  Neurology  Infectious disease   Procedures/Studies: Ct Head Wo Contrast  Result Date: 08/18/2016 CLINICAL DATA:  Altered mental status today. EXAM: CT HEAD WITHOUT CONTRAST TECHNIQUE: Contiguous axial images were obtained from the base of the skull through the vertex without intravenous contrast. COMPARISON:  Head CT dated 02/11/2016. FINDINGS: Brain: Ventricles are normal in size and configuration. There is no mass, hemorrhage, edema or other evidence of acute parenchymal abnormality. No extra-axial hemorrhage. Mild chronic small vessel ischemic changes noted within the white matter. Vascular: There are chronic calcified atherosclerotic changes of the large vessels at the skull base. No unexpected hyperdense vessel. Skull: Normal. Negative for fracture or focal lesion. Sinuses/Orbits: No acute finding. Other: None. IMPRESSION: No acute findings. No intracranial mass, hemorrhage or edema. Mild chronic small vessel ischemic changes within the white matter. Electronically Signed   By: Franki Cabot M.D.   On: 08/18/2016 19:18   Mr Jodene Nam Head Wo Contrast  Result Date: 08/20/2016 CLINICAL DATA:  Encephalopathy. Headache and confusion. Elevated CSF protein. EXAM: MRI HEAD WITHOUT AND WITH CONTRAST MRA HEAD WITHOUT CONTRAST TECHNIQUE: Multiplanar, multiecho pulse sequences of the brain and surrounding structures were obtained without and with intravenous contrast. Angiographic images  of the head were obtained using MRA technique without contrast. CONTRAST:  71mL MULTIHANCE GADOBENATE DIMEGLUMINE 529 MG/ML IV SOLN COMPARISON:  Head CT 08/18/2016 and MRI 01/09/2016 FINDINGS: MRI HEAD FINDINGS Brain: There is a subcentimeter focus of mildly increased diffusion weighted signal involving subcortical right occipital lobe (series 4, image 26) without clearly reduced ADC. No acute infarct is identified elsewhere. A chronic microhemorrhage at the level of the left frontal operculum is unchanged. Small foci of T2 hyperintensity in the cerebral white matter and pons are unchanged from the prior MRI and nonspecific but compatible with chronic small vessel ischemic disease, relatively mild for age. A chronic right caudate lacunar infarct is again seen.  No abnormal brain parenchymal or meningeal enhancement is identified. Vascular: Major intracranial vascular flow voids are preserved. Skull and upper cervical spine: No suspicious marrow lesion. Hyperostosis from talus. Sinuses/Orbits: Unremarkable orbits. Paranasal sinuses and mastoid air cells are clear. Other: None. MRA HEAD FINDINGS The visualized distal vertebral arteries are widely patent to the basilar with the left being mildly dominant. Left PICA, right AICA, and bilateral SCA origins are visualized and patent. The basilar artery is widely patent with a possible small incidental fenestration proximally. The PCAs are patent. There is a moderate left PCA origin stenosis. There is mild right P1 narrowing, and there are severe tandem P2 stenoses bilaterally. The internal carotid arteries are patent from skullbase to carotid termini. There is bilateral siphon atherosclerosis with mild right supraclinoid stenosis. There is a 2 x 1 mm inferiorly directed outpouching from the right ICA in the paraophthalmic region suggestive of a small aneurysm versus atherosclerotic irregularity. A smaller aneurysm is questioned in the left paraophthalmic region at the same  level. ACAs and MCAs are patent without evidence of proximal branch occlusion or flow limiting stenosis. There is mild proximal right A1 stenosis. IMPRESSION: 1. Subcentimeter focus of diffusion abnormality in the subcortical right occipital lobe which may reflect a subacute infarct. 2. Mild chronic small vessel ischemic disease. 3. Intracranial atherosclerosis as above including severe bilateral P2 stenoses. 4. 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 Electronically Signed   By: Logan Bores M.D.   On: 08/20/2016 15:22   Mr Jeri Cos [place] Contrast  Result Date: 08/20/2016 CLINICAL DATA:  Encephalopathy. Headache and confusion. Elevated CSF protein. EXAM: MRI HEAD WITHOUT AND WITH CONTRAST MRA HEAD WITHOUT CONTRAST TECHNIQUE: Multiplanar, multiecho pulse sequences of the brain and surrounding structures were obtained without and with intravenous contrast. Angiographic images of the head were obtained using MRA technique without contrast. CONTRAST:  53mL MULTIHANCE GADOBENATE DIMEGLUMINE 529 MG/ML IV SOLN COMPARISON:  Head CT 08/18/2016 and MRI 01/09/2016 FINDINGS: MRI HEAD FINDINGS Brain: There is a subcentimeter focus of mildly increased diffusion weighted signal involving subcortical right occipital lobe (series 4, image 26) without clearly reduced ADC. No acute infarct is identified elsewhere. A chronic microhemorrhage at the level of the left frontal operculum is unchanged. Small foci of T2 hyperintensity in the cerebral white matter and pons are unchanged from the prior MRI and nonspecific but compatible with chronic small vessel ischemic disease, relatively mild for age. A chronic right caudate lacunar infarct is again seen. No abnormal brain parenchymal or meningeal enhancement is identified. Vascular: Major intracranial vascular flow voids are preserved. Skull and upper cervical spine: No suspicious marrow lesion. Hyperostosis from talus. Sinuses/Orbits: Unremarkable orbits. Paranasal  sinuses and mastoid air cells are clear. Other: None. MRA HEAD FINDINGS The visualized distal vertebral arteries are widely patent to the basilar with the left being mildly dominant. Left PICA, right AICA, and bilateral SCA origins are visualized and patent. The basilar artery is widely patent with a possible small incidental fenestration proximally. The PCAs are patent. There is a moderate left PCA origin stenosis. There is mild right P1 narrowing, and there are severe tandem P2 stenoses bilaterally. The internal carotid arteries are patent from skullbase to carotid termini. There is bilateral siphon atherosclerosis with mild right supraclinoid stenosis. There is a 2 x 1 mm inferiorly directed outpouching from the right ICA in the paraophthalmic region suggestive of a small aneurysm versus atherosclerotic irregularity. A smaller aneurysm is questioned in the left paraophthalmic region at the same level.  ACAs and MCAs are patent without evidence of proximal branch occlusion or flow limiting stenosis. There is mild proximal right A1 stenosis. IMPRESSION: 1. Subcentimeter focus of diffusion abnormality in the subcortical right occipital lobe which may reflect a subacute infarct. 2. Mild chronic small vessel ischemic disease. 3. Intracranial atherosclerosis as above including severe bilateral P2 stenoses. 4. 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 Electronically Signed   By: Logan Bores M.D.   On: 08/20/2016 15:22   Dg Chest Port 1 View  Result Date: 08/18/2016 CLINICAL DATA:  Fever and altered mental status EXAM: PORTABLE CHEST 1 VIEW COMPARISON:  02/12/2016 FINDINGS: Right lung is grossly clear. Slightly elevated left diaphragm with minimal left basilar atelectasis. No focal consolidation. There is mild cardiomegaly. Aortic atherosclerosis. No pneumothorax. IMPRESSION: Cardiomegaly with minimal left basilar atelectasis. Electronically Signed   By: Donavan Foil M.D.   On: 08/18/2016 21:58    Dg Fluoro Guide Lumbar Puncture  Result Date: 08/19/2016 CLINICAL DATA:  Severe headache. EXAM: DIAGNOSTIC LUMBAR PUNCTURE UNDER FLUOROSCOPIC GUIDANCE FLUOROSCOPY TIME:  Fluoroscopy Time:  0 minutes and 12 seconds Radiation Exposure Index (if provided by the fluoroscopic device): N/A Number of Acquired Spot Images: 0 PROCEDURE: Informed consent was obtained from the patient prior to the procedure, including potential complications of headache, allergy, and pain. With the patient prone, the lower back was prepped with Betadine. 1% Lidocaine was used for local anesthesia. Lumbar puncture was performed at the L4-5 level using a 20 gauge needle with return of clear CSF. 8.0 ml of CSF were obtained for laboratory studies. The patient tolerated the procedure well and there were no apparent complications. IMPRESSION: Fluoroscopic guided lumbar puncture with clear CSF sent for appropriate laboratory evaluation. Patient tolerated the procedure well without immediate complications. Electronically Signed   By: Marijo Sanes M.D.   On: 08/19/2016 16:31      Subjective: Patient reports no headache, chest pain, dyspnea or rashes.  Discharge Exam: Vitals:   08/21/16 0911 08/21/16 1323  BP: (!) 172/70 (!) 159/67  Pulse: 68 67  Resp: 20 19  Temp: 98.2 F (36.8 C) 98.8 F (37.1 C)   Vitals:   08/21/16 0054 08/21/16 0515 08/21/16 0911 08/21/16 1323  BP: (!) 149/55 (!) 144/59 (!) 172/70 (!) 159/67  Pulse: 72 77 68 67  Resp: 18 20 20 19   Temp: 98.7 F (37.1 C) 98.2 F (36.8 C) 98.2 F (36.8 C) 98.8 F (37.1 C)  TempSrc: Oral Oral Oral Oral  SpO2: 99% 99% 98% 98%  Weight:      Height:        General: Pt is alert, awake, not in acute distress Cardiovascular: RRR, S1/S2 +, no rubs, no gallops Respiratory: CTA bilaterally, no wheezing, no rhonchi Abdominal: Soft, NT, ND, bowel sounds + Extremities: no edema, no cyanosis    The results of significant diagnostics from this hospitalization  (including imaging, microbiology, ancillary and laboratory) are listed below for reference.     Microbiology: Recent Results (from the past 240 hour(s))  Culture, blood (routine x 2)     Status: None (Preliminary result)   Collection Time: 08/18/16  9:48 PM  Result Value Ref Range Status   Specimen Description BLOOD RIGHT HAND  Final   Special Requests IN PEDIATRIC BOTTLE Blood Culture adequate volume  Final   Culture NO GROWTH 2 DAYS  Final   Report Status PENDING  Incomplete  Culture, blood (routine x 2)     Status: Abnormal   Collection Time:  08/18/16 10:08 PM  Result Value Ref Range Status   Specimen Description BLOOD RIGHT WRIST  Final   Special Requests   Final    BOTTLES DRAWN AEROBIC AND ANAEROBIC Blood Culture adequate volume   Culture  Setup Time   Final    GRAM POSITIVE COCCI IN CLUSTERS AEROBIC BOTTLE ONLY CRITICAL RESULT CALLED TO, READ BACK BY AND VERIFIED WITH: T. DANG PHARM 08/19/16 2004 BEAMJ    Culture (A)  Final    STAPHYLOCOCCUS SPECIES (COAGULASE NEGATIVE) THE SIGNIFICANCE OF ISOLATING THIS ORGANISM FROM A SINGLE SET OF BLOOD CULTURES WHEN MULTIPLE SETS ARE DRAWN IS UNCERTAIN. PLEASE NOTIFY THE MICROBIOLOGY DEPARTMENT WITHIN ONE WEEK IF SPECIATION AND SENSITIVITIES ARE REQUIRED.    Report Status 08/21/2016 FINAL  Final  Blood Culture ID Panel (Reflexed)     Status: Abnormal   Collection Time: 08/18/16 10:08 PM  Result Value Ref Range Status   Enterococcus species NOT DETECTED NOT DETECTED Final   Listeria monocytogenes NOT DETECTED NOT DETECTED Final   Staphylococcus species DETECTED (A) NOT DETECTED Final    Comment: Methicillin (oxacillin) susceptible coagulase negative staphylococcus. Possible blood culture contaminant (unless isolated from more than one blood culture draw or clinical case suggests pathogenicity). No antibiotic treatment is indicated for blood  culture contaminants. CRITICAL RESULT CALLED TO, READ BACK BY AND VERIFIED WITH: T. DANG PHARM  08/19/16 2004 BEAMJ    Staphylococcus aureus NOT DETECTED NOT DETECTED Final   Methicillin resistance NOT DETECTED NOT DETECTED Final   Streptococcus species NOT DETECTED NOT DETECTED Final   Streptococcus agalactiae NOT DETECTED NOT DETECTED Final   Streptococcus pneumoniae NOT DETECTED NOT DETECTED Final   Streptococcus pyogenes NOT DETECTED NOT DETECTED Final   Acinetobacter baumannii NOT DETECTED NOT DETECTED Final   Enterobacteriaceae species NOT DETECTED NOT DETECTED Final   Enterobacter cloacae complex NOT DETECTED NOT DETECTED Final   Escherichia coli NOT DETECTED NOT DETECTED Final   Klebsiella oxytoca NOT DETECTED NOT DETECTED Final   Klebsiella pneumoniae NOT DETECTED NOT DETECTED Final   Proteus species NOT DETECTED NOT DETECTED Final   Serratia marcescens NOT DETECTED NOT DETECTED Final   Haemophilus influenzae NOT DETECTED NOT DETECTED Final   Neisseria meningitidis NOT DETECTED NOT DETECTED Final   Pseudomonas aeruginosa NOT DETECTED NOT DETECTED Final   Candida albicans NOT DETECTED NOT DETECTED Final   Candida glabrata NOT DETECTED NOT DETECTED Final   Candida krusei NOT DETECTED NOT DETECTED Final   Candida parapsilosis NOT DETECTED NOT DETECTED Final   Candida tropicalis NOT DETECTED NOT DETECTED Final  Anaerobic culture     Status: None (Preliminary result)   Collection Time: 08/19/16  4:19 PM  Result Value Ref Range Status   Specimen Description CSF  Final   Special Requests NONE  Final   Culture   Final    NO ANAEROBES ISOLATED; CULTURE IN PROGRESS FOR 5 DAYS   Report Status PENDING  Incomplete  CSF culture     Status: None (Preliminary result)   Collection Time: 08/19/16  4:19 PM  Result Value Ref Range Status   Specimen Description CSF  Final   Special Requests NONE  Final   Gram Stain   Final    CYTOSPIN SMEAR WBC PRESENT, PREDOMINANTLY MONONUCLEAR NO ORGANISMS SEEN    Culture NO GROWTH 2 DAYS  Final   Report Status PENDING  Incomplete  Fungus  Culture With Stain     Status: None (Preliminary result)   Collection Time: 08/19/16  4:19 PM  Result  Value Ref Range Status   Fungus Stain Final report  Final    Comment: (NOTE) Performed At: [facility] [place], [place] [id number] Lindon Romp MD WU:[id number]    Fungus (Mycology) Culture PENDING  Incomplete   Fungal Source CSF  Final  Fungus Culture Result     Status: None   Collection Time: 08/19/16  4:19 PM  Result Value Ref Range Status   Result 1 Comment  Final    Comment: (NOTE) KOH/Calcofluor preparation:  no fungus observed. Performed At: [facility] [place], [place] [id number] Lindon Romp MD KV:[id number]      Labs: BNP (last 3 results)  Recent Labs  02/12/16 1745  BNP 643.3*   Basic Metabolic Panel:  Recent Labs Lab 08/18/16 1903 08/18/16 1904 08/20/16 0342  NA 137 136 140  K 4.1 4.1 3.5  CL 101 101 110  CO2  --  23 23  GLUCOSE 124* 126* 160*  BUN 19 16 10   CREATININE 1.10* 1.15* 0.98  CALCIUM  --  8.9 7.6*   Liver Function Tests:  Recent Labs Lab 08/18/16 1904 08/20/16 0342  AST 28 16  ALT 16 10*  ALKPHOS 54 36*  BILITOT 1.6* 0.6  PROT 6.8 4.8*  ALBUMIN 4.1 2.7*   No results for input(s): LIPASE, AMYLASE in the last 168 hours. No results for input(s): AMMONIA in the last 168 hours. CBC:  Recent Labs Lab 08/18/16 1903 08/18/16 1904 08/20/16 0342  WBC  --  11.0* 5.9  NEUTROABS  --  9.6*  --   HGB 15.6* 15.3* 12.5  HCT 46.0 45.5 38.9  MCV  --  83.6 86.8  PLT  --  233 172   Cardiac Enzymes: No results for input(s): CKTOTAL, CKMB, CKMBINDEX, TROPONINI in the last 168 hours. BNP: Invalid input(s): POCBNP CBG: No results for input(s): GLUCAP in the last 168 hours. D-Dimer No results for input(s): DDIMER in the last 72 hours. Hgb A1c No results for input(s): HGBA1C in the last 72 hours. Lipid Profile No results for input(s): CHOL, HDL, LDLCALC, TRIG, CHOLHDL, LDLDIRECT  in the last 72 hours. Thyroid function studies No results for input(s): TSH, T4TOTAL, T3FREE, THYROIDAB in the last 72 hours.  Invalid input(s): FREET3 Anemia work up No results for input(s): VITAMINB12, FOLATE, FERRITIN, TIBC, IRON, RETICCTPCT in the last 72 hours. Urinalysis    Component Value Date/Time   COLORURINE YELLOW 08/18/2016 2013   APPEARANCEUR CLEAR 08/18/2016 2013   LABSPEC 1.016 08/18/2016 2013   PHURINE 7.0 08/18/2016 2013   GLUCOSEU NEGATIVE 08/18/2016 2013   HGBUR NEGATIVE 08/18/2016 2013   [place] NEGATIVE 08/18/2016 2013   KETONESUR 20 (A) 08/18/2016 2013   PROTEINUR NEGATIVE 08/18/2016 2013   UROBILINOGEN 1.0 11/13/2013 1232   NITRITE NEGATIVE 08/18/2016 2013   LEUKOCYTESUR NEGATIVE 08/18/2016 2013   Sepsis Labs Invalid input(s): PROCALCITONIN,  WBC,  LACTICIDVEN Microbiology Recent Results (from the past 240 hour(s))  Culture, blood (routine x 2)     Status: None (Preliminary result)   Collection Time: 08/18/16  9:48 PM  Result Value Ref Range Status   Specimen Description BLOOD RIGHT HAND  Final   Special Requests IN PEDIATRIC BOTTLE Blood Culture adequate volume  Final   Culture NO GROWTH 2 DAYS  Final   Report Status PENDING  Incomplete  Culture, blood (routine x 2)     Status: Abnormal   Collection Time: 08/18/16 10:08 PM  Result Value Ref Range Status   Specimen  Description BLOOD RIGHT WRIST  Final   Special Requests   Final    BOTTLES DRAWN AEROBIC AND ANAEROBIC Blood Culture adequate volume   Culture  Setup Time   Final    GRAM POSITIVE COCCI IN CLUSTERS AEROBIC BOTTLE ONLY CRITICAL RESULT CALLED TO, READ BACK BY AND VERIFIED WITH: T. DANG PHARM 08/19/16 2004 BEAMJ    Culture (A)  Final    STAPHYLOCOCCUS SPECIES (COAGULASE NEGATIVE) THE SIGNIFICANCE OF ISOLATING THIS ORGANISM FROM A SINGLE SET OF BLOOD CULTURES WHEN MULTIPLE SETS ARE DRAWN IS UNCERTAIN. PLEASE NOTIFY THE MICROBIOLOGY DEPARTMENT WITHIN ONE WEEK IF SPECIATION AND  SENSITIVITIES ARE REQUIRED.    Report Status 08/21/2016 FINAL  Final  Blood Culture ID Panel (Reflexed)     Status: Abnormal   Collection Time: 08/18/16 10:08 PM  Result Value Ref Range Status   Enterococcus species NOT DETECTED NOT DETECTED Final   Listeria monocytogenes NOT DETECTED NOT DETECTED Final   Staphylococcus species DETECTED (A) NOT DETECTED Final    Comment: Methicillin (oxacillin) susceptible coagulase negative staphylococcus. Possible blood culture contaminant (unless isolated from more than one blood culture draw or clinical case suggests pathogenicity). No antibiotic treatment is indicated for blood  culture contaminants. CRITICAL RESULT CALLED TO, READ BACK BY AND VERIFIED WITH: T. DANG PHARM 08/19/16 2004 BEAMJ    Staphylococcus aureus NOT DETECTED NOT DETECTED Final   Methicillin resistance NOT DETECTED NOT DETECTED Final   Streptococcus species NOT DETECTED NOT DETECTED Final   Streptococcus agalactiae NOT DETECTED NOT DETECTED Final   Streptococcus pneumoniae NOT DETECTED NOT DETECTED Final   Streptococcus pyogenes NOT DETECTED NOT DETECTED Final   Acinetobacter baumannii NOT DETECTED NOT DETECTED Final   Enterobacteriaceae species NOT DETECTED NOT DETECTED Final   Enterobacter cloacae complex NOT DETECTED NOT DETECTED Final   Escherichia coli NOT DETECTED NOT DETECTED Final   Klebsiella oxytoca NOT DETECTED NOT DETECTED Final   Klebsiella pneumoniae NOT DETECTED NOT DETECTED Final   Proteus species NOT DETECTED NOT DETECTED Final   Serratia marcescens NOT DETECTED NOT DETECTED Final   Haemophilus influenzae NOT DETECTED NOT DETECTED Final   Neisseria meningitidis NOT DETECTED NOT DETECTED Final   Pseudomonas aeruginosa NOT DETECTED NOT DETECTED Final   Candida albicans NOT DETECTED NOT DETECTED Final   Candida glabrata NOT DETECTED NOT DETECTED Final   Candida krusei NOT DETECTED NOT DETECTED Final   Candida parapsilosis NOT DETECTED NOT DETECTED Final    Candida tropicalis NOT DETECTED NOT DETECTED Final  Anaerobic culture     Status: None (Preliminary result)   Collection Time: 08/19/16  4:19 PM  Result Value Ref Range Status   Specimen Description CSF  Final   Special Requests NONE  Final   Culture   Final    NO ANAEROBES ISOLATED; CULTURE IN PROGRESS FOR 5 DAYS   Report Status PENDING  Incomplete  CSF culture     Status: None (Preliminary result)   Collection Time: 08/19/16  4:19 PM  Result Value Ref Range Status   Specimen Description CSF  Final   Special Requests NONE  Final   Gram Stain   Final    CYTOSPIN SMEAR WBC PRESENT, PREDOMINANTLY MONONUCLEAR NO ORGANISMS SEEN    Culture NO GROWTH 2 DAYS  Final   Report Status PENDING  Incomplete  Fungus Culture With Stain     Status: None (Preliminary result)   Collection Time: 08/19/16  4:19 PM  Result Value Ref Range Status   Fungus Stain Final report  Final  Comment: (NOTE) Performed At: [facility] [place], [place] [id number] Lindon Romp MD [id number]    Fungus (Mycology) Culture PENDING  Incomplete   Fungal Source CSF  Final  Fungus Culture Result     Status: None   Collection Time: 08/19/16  4:19 PM  Result Value Ref Range Status   Result 1 Comment  Final    Comment: (NOTE) KOH/Calcofluor preparation:  no fungus observed. Performed At: [facility] [place], [place] [id number] Lindon Romp MD [id number]      Time coordinating discharge: Over 30 minutes  SIGNED:   Cordelia Poche, MD [facility] 08/21/2016, 5:44 PM Pager [phone]  If 7PM-7AM, please contact night-coverage [email or web address] Password [email or web address]

## 2016-08-21 NOTE — Progress Notes (Addendum)
MRI official report documents a subcentimeter focus of diffusion abnormality in the subcortical right occipital lobe which may reflect a subacute infarct. On my review of the images, this finding is faint, almost with the same signal as the background, and appears most consistent with T2 shine-through artifact. Alternatively, it may be a relatively new ischemic white matter lesion given the mild chronic small vessel ischemic disease also seen on MRI. This finding does not militate in favor of a full stroke workup. 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  Recommendations: 1. Continue ASA 2. Switch pravachol to atorvastatin 40 mg po qd. 3. EEG pending 4. ABX are being discontinued. Appreciate input from ID.  5. If EEG negative, most likely will be able to discharge home.  6. Given that the patient is clinically improved and cognition is completely normal, IV solumedrol treatment benefits most likely are outweighed by risks, given the borderline elevated protein in her CSF.  7. If she has a recurrent episode of headache with AMS, she should be reevaluated.  8. Outpatient follow up with Dr. Jannifer Franklin.  Electronically signed: Dr. Kerney Elbe

## 2016-08-22 LAB — EHRLICHIA ANTIBODY PANEL
E CHAFFEENSIS AB, IGM: NEGATIVE
E chaffeensis (HGE) Ab, IgG: NEGATIVE
E. CHAFFEENSIS IGG AB: NEGATIVE
E. Chaffeensis (HME) IgM Titer: NEGATIVE

## 2016-08-22 NOTE — Care Management Note (Signed)
Case Management Note  Patient Details  Name: [name] MRN: [id number] Date of Birth: [birth year]  Subjective/Objective:                    Action/Plan: 08/22/2016 at 0925: Patient discharged home late yesterday. Pt with insurance, PCP and transportation home. No further needs per CM.  Expected Discharge Date:  08/21/16               Expected Discharge Plan:  Home/Self Care  In-House Referral:     Discharge planning Services     Post Acute Care Choice:    Choice offered to:     DME Arranged:    DME Agency:     HH Arranged:    HH Agency:     Status of Service:  Completed, signed off  If discussed at [employer] of Stay Meetings, dates discussed:    Additional Comments:  Pollie Friar, RN 08/22/2016, 9:25 AM

## 2016-08-23 LAB — CSF CULTURE

## 2016-08-23 LAB — CSF CULTURE W GRAM STAIN: Culture: NO GROWTH

## 2016-08-24 LAB — CULTURE, BLOOD (ROUTINE X 2)
Culture: NO GROWTH
Special Requests: ADEQUATE

## 2016-08-24 LAB — ANAEROBIC CULTURE

## 2016-08-25 NOTE — Telephone Encounter (Signed)
I spoke with the patient's husband and she is not feeling better at this point.  He will have her call and reschedule procedure when she is up to it. They understand that I will await their call.

## 2016-08-29 ENCOUNTER — Other Ambulatory Visit: Payer: Self-pay | Admitting: Obstetrics and Gynecology

## 2016-08-29 ENCOUNTER — Ambulatory Visit
Admission: RE | Admit: 2016-08-29 | Discharge: 2016-08-29 | Disposition: A | Discharge status: Home / Self Care | Payer: Medicare Other | Source: Ambulatory Visit | Attending: Obstetrics and Gynecology | Admitting: Obstetrics and Gynecology

## 2016-08-29 DIAGNOSIS — R102 Pelvic and perineal pain: Secondary | ICD-10-CM

## 2016-08-29 LAB — B. BURGDORFI ANTIBODIES, CSF

## 2016-09-02 ENCOUNTER — Telehealth: Payer: Self-pay | Admitting: *Deleted

## 2016-09-02 NOTE — Telephone Encounter (Signed)
Faxed signed/dated form by CW,MD. Fax: [phone]. Received confirmation.

## 2016-09-02 NOTE — Telephone Encounter (Signed)
Received fax request from [facility] for refill on sumatriptan and directions to read "Take 1 tablet po at onset of migraine. May repeat in 2 hours if needed. No more than 2 tablets in 24 hours". Awaiting MD signature on form and will fax once completed.

## 2016-09-04 DIAGNOSIS — R310 Gross hematuria: Secondary | ICD-10-CM | POA: Diagnosis not present

## 2016-09-05 DIAGNOSIS — Z13820 Encounter for screening for osteoporosis: Secondary | ICD-10-CM | POA: Diagnosis not present

## 2016-09-05 DIAGNOSIS — M8588 Other specified disorders of bone density and structure, other site: Secondary | ICD-10-CM | POA: Diagnosis not present

## 2016-09-05 DIAGNOSIS — R102 Pelvic and perineal pain: Secondary | ICD-10-CM | POA: Diagnosis not present

## 2016-09-05 DIAGNOSIS — N958 Other specified menopausal and perimenopausal disorders: Secondary | ICD-10-CM | POA: Diagnosis not present

## 2016-09-08 ENCOUNTER — Ambulatory Visit (INDEPENDENT_AMBULATORY_CARE_PROVIDER_SITE_OTHER): Payer: Medicare Other | Admitting: Neurology

## 2016-09-08 ENCOUNTER — Encounter: Payer: Self-pay | Admitting: Neurology

## 2016-09-08 VITALS — BP 143/70 | HR 60 | Ht 62.0 in | Wt 197.8 lb

## 2016-09-08 DIAGNOSIS — G43119 Migraine with aura, intractable, without status migrainosus: Secondary | ICD-10-CM | POA: Diagnosis not present

## 2016-09-08 DIAGNOSIS — E538 Deficiency of other specified B group vitamins: Secondary | ICD-10-CM | POA: Diagnosis not present

## 2016-09-08 NOTE — Progress Notes (Signed)
Reason for visit: Migraine headache  [name] is an 76 y.o. female  History of present illness:  [name] is a 76 year old right-handed white female with a history of classic migraine headache. The patient had a very severe migraine on 08/18/2016 that did not abate with her typical medication regimen. The patient was seen in the office and received injections of Depacon and Toradol without benefit. The patient was sent to the emergency room, and she was admitted for an evaluation. MRI of the brain done during that hospitalization showed a punctate right occipital diffusion weighted positive area. The patient was kept in the hospital for a couple of days, lumbar puncture was done and was unremarkable exception of an elevated spinal fluid protein. There was an equivocally positive [facility] spotted fever antibody titer. An EEG study done was normal. The patient has done well coming out of the hospital, she had one episode of migraine aura, she took Imitrex and Phenergan and took a nap, and she never had a headache. The patient has some soreness of the left side the head that has been persistent since the severe headache on June 11. She returns to the office today for an evaluation.   Past Medical History:  Diagnosis Date  . Arthritis    "got alot; especially in my spine"  . Chest pain 02/2016  . Chronic back pain    "all of it"  . Classical migraine with intractable migraine 02/25/2016  . Diverticulosis of colon (without mention of hemorrhage)   . DVT of leg (deep venous thrombosis) ([place]) 09/2011   LLE  . Dysrhythmia    notes in epic  . Esophageal reflux   . Family history of malignant neoplasm of gastrointestinal tract   . Fibromyalgia    "dx'd w/it when it first came out"  . Hiatal hernia   . Hyperlipidemia   . Hypertension   . Migraines 09/24/11   "often recently;  more controlled now"  . Obesity, unspecified   . Osteoporosis   . Palpitations    event monitor 8/13:  NSR, Sinus brady, Sinus Tachy, PAC  . Personal history of colonic polyps 05/31/2007   adenomatous polyp  . Pneumonia    "3 times at least" (09/24/11)  . Rheumatoid arthritis(714.0)   . Scoliosis   . Stricture and stenosis of esophagus   . Unspecified gastritis and gastroduodenitis without mention of hemorrhage     Past Surgical History:  Procedure Laterality Date  . BACK SURGERY    . BREAST CYST INCISION AND DRAINAGE  2012   left; "golf-ball sized"  . BREAST SURGERY     cyst lanced and drainded  . COLON SURGERY    . COLONOSCOPY  2009  . ESOPHAGEAL DILATION     "several times"  . EXPLORATORY LAPAROTOMY  04/2000   "for ruptured colon"  . HEMATOMA EVACUATION  09/2011   LLE  . LUMBAR LAMINECTOMY/DECOMPRESSION MICRODISCECTOMY  09/03/2011   Procedure: LUMBAR LAMINECTOMY/DECOMPRESSION MICRODISCECTOMY 2 LEVELS;  Surgeon: Elaina Hoops, MD;  Location: [facility];  Service: Neurosurgery;  Laterality: Left;  Left Lumbar Three-Four, Lumbar Four-Five Decompressive Laminectomy  . SALIVARY GLAND SURGERY  ~ 1980   1 removed  . SHOULDER SURGERY  2008   right shoulder and collar bone  . SIGMOID RESECTION / RECTOPEXY  09/1999   Dr Rosana Hoes  . TRIGGER FINGER RELEASE  2004   left thumb  . TUMOR REMOVAL  1960's   left leg  . UPPER GASTROINTESTINAL  ENDOSCOPY      Family History  Problem Relation Age of Onset  . Stroke Mother   . Hypertension Mother   . Stroke Father   . Hypertension Father   . Diabetes Father   . Pancreatic cancer Sister   . Diabetes Sister        x 2  . Colon cancer Sister 83  . Pancreatic cancer Sister   . Heart attack Neg Hx   . Stomach cancer Neg Hx     Social history:  reports that she has never smoked. She has never used smokeless tobacco. She reports that she does not drink alcohol or use drugs.    Allergies  Allergen Reactions  . Reglan [Metoclopramide] Other (See Comments)    Speech is garbled, extreme trouble making words    Medications:  Prior to  Admission medications   Medication Sig Start Date End Date Taking? Authorizing Provider  albuterol (PROVENTIL HFA;VENTOLIN HFA) 108 (90 Base) MCG/ACT inhaler Inhale 2 puffs into the lungs every 6 (six) hours as needed for wheezing or shortness of breath.   Yes [provider]  aspirin EC 81 MG tablet Take 81 mg by mouth 2 (two) times a week.    Yes [provider]  calcium carbonate (TUMS EX) 750 MG chewable tablet Chew 2 tablets by mouth daily as needed for heartburn.    Yes [provider]  Cyanocobalamin (VITAMIN B-12 IJ) Inject as directed every 30 (thirty) days.   Yes [provider]  fluticasone (FLONASE) 50 MCG/ACT nasal spray Place 1 spray into both nostrils daily as needed for allergies. 08/02/16  Yes [provider]  hydrochlorothiazide (HYDRODIURIL) 25 MG tablet Take 1 tablet (25 mg total) by mouth daily. 06/17/16  Yes End, Harrell Gave, MD  lisinopril (PRINIVIL,ZESTRIL) 10 MG tablet Take 1 tablet (10 mg total) by mouth every evening. 06/24/16 09/22/16 Yes End, Harrell Gave, MD  loratadine (CLARITIN) 10 MG tablet Take 10 mg by mouth daily as needed for allergies.   Yes [provider]  metoprolol (LOPRESSOR) 50 MG tablet 1/2 tablet (25 mg) two times a day Patient taking differently: Take 25 mg by mouth 2 (two) times daily.  06/27/16  Yes End, Harrell Gave, MD  pantoprazole (PROTONIX) 40 MG tablet Take 1 tablet (40 mg total) by mouth 2 (two) times daily before a meal. 04/24/16  Yes Gatha Mayer, MD  polyethylene glycol (MIRALAX / GLYCOLAX) packet Take 17 g by mouth daily. Patient taking differently: Take 17 g by mouth daily as needed for moderate constipation.  02/15/16  Yes Hollice Gong, Mir Mohammed, MD  promethazine (PHENERGAN) 25 MG tablet Take 1 tablet (25 mg total) by mouth every 6 (six) hours as needed for nausea or vomiting. 08/18/16  Yes Kathrynn Ducking, MD  SUMAtriptan (IMITREX) 100 MG tablet Take 1 tablet (100 mg total) by mouth 2  (two) times daily as needed for migraine. 08/18/16  Yes Kathrynn Ducking, MD  pravastatin (PRAVACHOL) 20 MG tablet Take 1 tablet (20 mg total) by mouth every evening. 01/21/16 08/19/16  Richardson Dopp T, PA-C    ROS:  Out of a complete 14 system review of symptoms, the patient complains only of the following symptoms, and all other reviewed systems are negative.  Fatigue Eye itching, eye redness Joint pain, joint swelling, back pain, walking difficulty, neck pain, neck stiffness  Blood pressure (!) 143/70, pulse 60, height 5\' 2"  (1.575 m), weight 197 lb 12 oz (89.7 kg).  Physical Exam  General: The  patient is alert and cooperative at the time of the examination. The patient is markedly obese.  Skin: No significant peripheral edema is noted.   Neurologic Exam  Mental status: The patient is alert and oriented x 3 at the time of the examination. The patient has apparent normal recent and remote memory, with an apparently normal attention span and concentration ability.   Cranial nerves: Facial symmetry is present. Speech is normal, no aphasia or dysarthria is noted. Extraocular movements are full. Visual fields are full.  Motor: The patient has good strength in all 4 extremities.  Sensory examination: Soft touch sensation is symmetric on the face, arms, and legs.  Coordination: The patient has good finger-nose-finger and heel-to-shin bilaterally.  Gait and station: The patient has a normal gait. Tandem gait is normal. Romberg is negative. No drift is seen.  Reflexes: Deep tendon reflexes are symmetric.  MRI and MRA brain 08/20/16:  IMPRESSION: 1. Subcentimeter focus of diffusion abnormality in the subcortical right occipital lobe which may reflect a subacute infarct. 2. Mild chronic small vessel ischemic disease. 3. Intracranial atherosclerosis as above including severe bilateral P2 stenoses. 4. Small right and possibly left paraophthalmic ICA  aneurysms.   Assessment/Plan:  1. Migraine headache  2. Right occipital abnormality by MRI  The right occipital MRI abnormality likely is related to the severe migraine headache that occurred. The patient has done better coming out of the hospital but she has some persistence of soreness in the left frontotemporal area, we will check a sedimentation rate today. The patient will continue her current medications, she is on a beta blocker. She will follow-up in about 5 months. She will call me if she continues to have issues.  Jill Alexanders MD 09/08/2016 1:51 PM  [facility] [address] [place], [place] [address]  Phone [phone] Fax [phone]

## 2016-09-08 NOTE — Telephone Encounter (Signed)
Pt is wanting to f/u with Dr Jannifer Franklin after the hospital visit. Please call to schedule at [phone]

## 2016-09-08 NOTE — Telephone Encounter (Signed)
Spoke to patient - she has been worked into Dr. Jannifer Franklin' schedule today.

## 2016-09-09 DIAGNOSIS — G43909 Migraine, unspecified, not intractable, without status migrainosus: Secondary | ICD-10-CM | POA: Diagnosis not present

## 2016-09-09 DIAGNOSIS — I10 Essential (primary) hypertension: Secondary | ICD-10-CM | POA: Diagnosis not present

## 2016-09-09 DIAGNOSIS — E7800 Pure hypercholesterolemia, unspecified: Secondary | ICD-10-CM | POA: Diagnosis not present

## 2016-09-09 DIAGNOSIS — E538 Deficiency of other specified B group vitamins: Secondary | ICD-10-CM | POA: Diagnosis not present

## 2016-09-09 LAB — SEDIMENTATION RATE: SED RATE: 11 mm/h (ref 0–40)

## 2016-09-11 ENCOUNTER — Ambulatory Visit: Payer: Medicare Other | Admitting: Internal Medicine

## 2016-09-12 ENCOUNTER — Ambulatory Visit (INDEPENDENT_AMBULATORY_CARE_PROVIDER_SITE_OTHER): Payer: Medicare Other | Admitting: Internal Medicine

## 2016-09-12 ENCOUNTER — Encounter: Payer: Self-pay | Admitting: Internal Medicine

## 2016-09-12 VITALS — BP 130/64 | HR 64 | Ht 60.0 in | Wt 198.4 lb

## 2016-09-12 DIAGNOSIS — E785 Hyperlipidemia, unspecified: Secondary | ICD-10-CM | POA: Diagnosis not present

## 2016-09-12 DIAGNOSIS — R0789 Other chest pain: Secondary | ICD-10-CM | POA: Diagnosis not present

## 2016-09-12 DIAGNOSIS — I10 Essential (primary) hypertension: Secondary | ICD-10-CM

## 2016-09-12 NOTE — Progress Notes (Signed)
Follow-up Outpatient Visit Date: 09/12/2016  Primary Care Provider: [facility] @ [place] [address]  Chief Complaint: Hospital follow-up  HPI:  [name] is a 76 y.o. year-old female with history of HTN, GERD, autonomic dysfunction, and migraine headaches, who presents for follow-up of chest pain. I last saw her in January, at which time she was doing well. She had reported one episode of atypical chest pain in the stetting of a migraine headache last fall; we deferred further workup and planned for follow-up in about 1 year.  Today, [name] reports doing well from a heart standpoint. She has not had any chest pain since our last visit. She also denies palpitations, lightheadedness, and edema. She has stable exertional dyspnea. [name] was admitted at [facility] last month due to intractable headaches. She subsequently spiked a fever and was worked up for sepsis and possible tick-bite infection. She also underwent LP. No clear etiology was identified. [name] continues to have intermittent headaches, which have been a longstanding problem for her.  --------------------------------------------------------------------------------------------------  Cardiovascular History & Procedures: Cardiovascular Problems:  Hypertension  Autonomic dysfunction  Chest wall pain  Risk Factors:  Hypertension and age > 67  Cath/PCI:  None  CV Surgery:  None  EP Procedures and Devices:  None  Non-Invasive Evaluation(s):  TTE (02/13/16): Normal LV size with mild LVH. LVEF 50-55%. Normal RV size and function. No significant valvular abnormalities.  LE arterial Doppler (02/04/13): Normal ABIs/TBIs  Pharmacologic myocardial perfusion stress test (05/08/11): Normal study without ischemia or scar. LVEF 80%.  Recent CV Pertinent Labs: Lab Results  Component Value Date   CHOL 173 04/25/2016   HDL 55 04/25/2016   LDLCALC 92  04/25/2016   TRIG 129 04/25/2016   CHOLHDL 3.1 04/25/2016   CHOLHDL 4.5 06/06/2007   INR 0.94 08/18/2016   BNP 127.4 (H) 02/12/2016   K 3.5 08/20/2016   MG 2.1 01/21/2016   BUN 10 08/20/2016   CREATININE 0.98 08/20/2016   CREATININE 0.93 01/21/2016    Past medical and surgical history were reviewed and updated in EPIC.  Current Meds  Medication Sig  . albuterol (PROVENTIL HFA;VENTOLIN HFA) 108 (90 Base) MCG/ACT inhaler Inhale 2 puffs into the lungs every 6 (six) hours as needed for wheezing or shortness of breath.  [name] aspirin EC 81 MG tablet Take 81 mg by mouth 2 (two) times a week.   . calcium carbonate (TUMS EX) 750 MG chewable tablet Chew 2 tablets by mouth daily as needed for heartburn.   . Cyanocobalamin (VITAMIN B-12 IJ) Inject as directed every 30 (thirty) days.  . fluticasone (FLONASE) 50 MCG/ACT nasal spray Place 1 spray into both nostrils daily as needed for allergies.  . hydrochlorothiazide (HYDRODIURIL) 25 MG tablet Take 1 tablet (25 mg total) by mouth daily.  [name] lisinopril (PRINIVIL,ZESTRIL) 10 MG tablet Take 1 tablet (10 mg total) by mouth every evening.  . loratadine (CLARITIN) 10 MG tablet Take 10 mg by mouth daily as needed for allergies.  . metoprolol tartrate (LOPRESSOR) 25 MG tablet Take 25 mg by mouth 2 (two) times daily.  . pantoprazole (PROTONIX) 40 MG tablet Take 1 tablet (40 mg total) by mouth 2 (two) times daily before a meal.  . polyethylene glycol (MIRALAX / GLYCOLAX) packet Take 17 g by mouth daily as needed for mild constipation.  . pravastatin (PRAVACHOL) 20 MG tablet Take 20 mg by mouth daily.  . promethazine (PHENERGAN) 25 MG tablet Take  1 tablet (25 mg total) by mouth every 6 (six) hours as needed for nausea or vomiting.  . SUMAtriptan (IMITREX) 100 MG tablet Take 1 tablet (100 mg total) by mouth 2 (two) times daily as needed for migraine.    Allergies: Reglan [metoclopramide]  Social History   Social History  . Marital status: Married    Spouse  name: N/A  . Number of children: 1  . 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  Retired   Social History Main Topics  . Smoking status: Never Smoker  . Smokeless tobacco: Never Used  . Alcohol use No  . Drug use: No  . Sexual activity: Not Currently    Partners: Male    Birth control/ protection: Post-menopausal   Other Topics Concern  . Not on file   Social History Narrative   Lives at home w/ her husband   Right-handed   Caffeine: usually drinks decaf and tea    Family History  Problem Relation Age of Onset  . Stroke Mother   . Hypertension Mother   . Stroke Father   . Hypertension Father   . Diabetes Father   . Pancreatic cancer Sister   . Diabetes Sister        x 2  . Colon cancer Sister 71  . Pancreatic cancer Sister   . Heart attack Neg Hx   . Stomach cancer Neg Hx     Review of Systems: The patient has intermittent pain in both feet complicated by a hammertoe on the right. Otherwise, a 12-system review of systems was performed and was negative except as noted in the HPI.  --------------------------------------------------------------------------------------------------  Physical Exam: BP 130/64   Pulse 64   Ht 5' (1.524 m)   Wt 198 lb 6.4 oz (90 kg)   LMP  (LMP Unknown)   BMI 38.75 kg/m   General:  Obese woman, seated comfortably in the exam room. HEENT: No conjunctival pallor or scleral icterus.  Moist mucous membranes.  OP clear. Neck: Supple without lymphadenopathy, thyromegaly, JVD, or HJR.  No carotid bruit. Lungs: Normal work of breathing.  Clear to auscultation bilaterally without wheezes or crackles. Heart: Regular rate and rhythm without murmurs, rubs, or gallops.  Unable to assess PMI due to body habitus. Abd: Bowel sounds present.  Soft, NT/ND. Unable to assess HSM due to body habitus. Ext: No lower extremity edema.  Radial, PT, and DP pulses are 2+ bilaterally. Skin: Warm and dry without rash.   Lab Results    Component Value Date   WBC 5.9 08/20/2016   HGB 12.5 08/20/2016   HCT 38.9 08/20/2016   MCV 86.8 08/20/2016   PLT 172 08/20/2016    Lab Results  Component Value Date   NA 140 08/20/2016   K 3.5 08/20/2016   CL 110 08/20/2016   CO2 23 08/20/2016   BUN 10 08/20/2016   CREATININE 0.98 08/20/2016   GLUCOSE 160 (H) 08/20/2016   ALT 10 (L) 08/20/2016    Lab Results  Component Value Date   CHOL 173 04/25/2016   HDL 55 04/25/2016   LDLCALC 92 04/25/2016   TRIG 129 04/25/2016   CHOLHDL 3.1 04/25/2016    --------------------------------------------------------------------------------------------------  ASSESSMENT AND PLAN: Atypical chest pain [name] has not had a recurrence since our last visit. Exertional dyspnea is also stable. No further workup indicated at this time. Continue with primary prevention of CAD.  Hypertension and autonomic dysfunction [name]'s blood pressure is upper  normal today. No medication changes at this time.  Dyslipidemia Continue statin therapy. Defer medication changes to her PCP.  Follow-up: Return to clinic in 6 months.  Nelva Bush, MD 09/12/2016 12:01 PM

## 2016-09-12 NOTE — Patient Instructions (Signed)
Medication Instructions:  Your physician recommends that you continue on your current medications as directed. Please refer to the Current Medication list given to you today.  Labwork: No new orders.   Testing/Procedures: No new orders.   Follow-Up: Your physician wants you to follow-up in: 69 MONTHS with Dr End.  You will receive a reminder letter in the mail two months in advance. If you don't receive a letter, please call our office to schedule the follow-up appointment.   Any Other Special Instructions Will Be Listed Below (If Applicable).     If you need a refill on your cardiac medications before your next appointment, please call your pharmacy.

## 2016-09-15 DIAGNOSIS — R310 Gross hematuria: Secondary | ICD-10-CM | POA: Diagnosis not present

## 2016-09-15 DIAGNOSIS — K8020 Calculus of gallbladder without cholecystitis without obstruction: Secondary | ICD-10-CM | POA: Diagnosis not present

## 2016-09-16 LAB — FUNGUS CULTURE WITH STAIN

## 2016-09-16 LAB — FUNGAL ORGANISM REFLEX

## 2016-09-16 LAB — FUNGUS CULTURE RESULT

## 2016-09-24 DIAGNOSIS — R102 Pelvic and perineal pain: Secondary | ICD-10-CM | POA: Diagnosis not present

## 2016-09-24 DIAGNOSIS — R310 Gross hematuria: Secondary | ICD-10-CM | POA: Diagnosis not present

## 2016-10-09 DIAGNOSIS — E538 Deficiency of other specified B group vitamins: Secondary | ICD-10-CM | POA: Diagnosis not present

## 2016-10-28 DIAGNOSIS — Z23 Encounter for immunization: Secondary | ICD-10-CM | POA: Diagnosis not present

## 2016-10-28 DIAGNOSIS — Z0000 Encounter for general adult medical examination without abnormal findings: Secondary | ICD-10-CM | POA: Diagnosis not present

## 2016-10-28 DIAGNOSIS — I10 Essential (primary) hypertension: Secondary | ICD-10-CM | POA: Diagnosis not present

## 2016-10-28 DIAGNOSIS — G43909 Migraine, unspecified, not intractable, without status migrainosus: Secondary | ICD-10-CM | POA: Diagnosis not present

## 2016-11-13 DIAGNOSIS — E538 Deficiency of other specified B group vitamins: Secondary | ICD-10-CM | POA: Diagnosis not present

## 2016-12-15 DIAGNOSIS — E538 Deficiency of other specified B group vitamins: Secondary | ICD-10-CM | POA: Diagnosis not present

## 2017-01-17 ENCOUNTER — Other Ambulatory Visit: Payer: Self-pay | Admitting: Physician Assistant

## 2017-01-17 DIAGNOSIS — E7800 Pure hypercholesterolemia, unspecified: Secondary | ICD-10-CM

## 2017-01-19 DIAGNOSIS — E538 Deficiency of other specified B group vitamins: Secondary | ICD-10-CM | POA: Diagnosis not present

## 2017-02-10 ENCOUNTER — Telehealth: Payer: Self-pay | Admitting: Internal Medicine

## 2017-02-10 NOTE — Telephone Encounter (Signed)
Patient wants to reschedule the endo/colon from June.  Do you want to see her back in the OV or direct.  Was cancelled due to encephalopathy.  She says her migraines are stable and has not had any other issues since June.

## 2017-02-12 ENCOUNTER — Other Ambulatory Visit: Payer: Self-pay

## 2017-02-12 DIAGNOSIS — Z1211 Encounter for screening for malignant neoplasm of colon: Secondary | ICD-10-CM

## 2017-02-12 DIAGNOSIS — K219 Gastro-esophageal reflux disease without esophagitis: Secondary | ICD-10-CM

## 2017-02-12 NOTE — Telephone Encounter (Signed)
OK to reschedule procedures direct  Low volume Clen-Piq prep please

## 2017-02-12 NOTE — Telephone Encounter (Signed)
Patient scheduled for pre-visit and procedures at [facility] for 03/16/17 at 12;00

## 2017-02-20 DIAGNOSIS — E538 Deficiency of other specified B group vitamins: Secondary | ICD-10-CM | POA: Diagnosis not present

## 2017-02-25 ENCOUNTER — Other Ambulatory Visit: Payer: Self-pay

## 2017-02-25 ENCOUNTER — Ambulatory Visit (AMBULATORY_SURGERY_CENTER): Payer: Self-pay | Admitting: *Deleted

## 2017-02-25 VITALS — Ht 60.0 in | Wt 198.0 lb

## 2017-02-25 DIAGNOSIS — K219 Gastro-esophageal reflux disease without esophagitis: Secondary | ICD-10-CM

## 2017-02-25 DIAGNOSIS — Z800 Family history of malignant neoplasm of digestive organs: Secondary | ICD-10-CM

## 2017-02-25 DIAGNOSIS — Z86010 Personal history of colonic polyps: Secondary | ICD-10-CM

## 2017-02-25 MED ORDER — SOD PICOSULFATE-MAG OX-CIT ACD 10-3.5-12 MG-GM -GM/160ML PO SOLN: 1.0000 | ORAL | 0 refills | Status: DC

## 2017-02-25 NOTE — Progress Notes (Signed)
Patient denies any allergies to eggs or soy. Patient denies any problems with anesthesia/sedation. Patient denies any oxygen use at home. Patient denies taking any diet/weight loss medications or blood thinners. Clenpiq sample prep given to pt during PV.

## 2017-02-27 ENCOUNTER — Other Ambulatory Visit: Payer: Self-pay

## 2017-02-27 ENCOUNTER — Encounter (HOSPITAL_COMMUNITY): Payer: Self-pay | Admitting: Emergency Medicine

## 2017-03-16 ENCOUNTER — Encounter (HOSPITAL_COMMUNITY)
Admission: RE | Disposition: A | Discharge status: Home / Self Care | Payer: Self-pay | Source: Ambulatory Visit | Attending: Internal Medicine

## 2017-03-16 ENCOUNTER — Other Ambulatory Visit: Payer: Self-pay

## 2017-03-16 ENCOUNTER — Encounter (HOSPITAL_COMMUNITY): Payer: Self-pay

## 2017-03-16 ENCOUNTER — Ambulatory Visit (HOSPITAL_COMMUNITY): Payer: Medicare Other | Admitting: Anesthesiology

## 2017-03-16 ENCOUNTER — Ambulatory Visit (HOSPITAL_COMMUNITY)
Admission: RE | Admit: 2017-03-16 | Discharge: 2017-03-16 | Disposition: A | Discharge status: Home / Self Care | Payer: Medicare Other | Source: Ambulatory Visit | Attending: Internal Medicine | Admitting: Internal Medicine

## 2017-03-16 DIAGNOSIS — Z800 Family history of malignant neoplasm of digestive organs: Secondary | ICD-10-CM | POA: Insufficient documentation

## 2017-03-16 DIAGNOSIS — Z7982 Long term (current) use of aspirin: Secondary | ICD-10-CM | POA: Diagnosis not present

## 2017-03-16 DIAGNOSIS — K222 Esophageal obstruction: Secondary | ICD-10-CM | POA: Diagnosis not present

## 2017-03-16 DIAGNOSIS — D124 Benign neoplasm of descending colon: Secondary | ICD-10-CM

## 2017-03-16 DIAGNOSIS — M1990 Unspecified osteoarthritis, unspecified site: Secondary | ICD-10-CM | POA: Diagnosis not present

## 2017-03-16 DIAGNOSIS — Z86010 Personal history of colonic polyps: Secondary | ICD-10-CM | POA: Diagnosis not present

## 2017-03-16 DIAGNOSIS — K5730 Diverticulosis of large intestine without perforation or abscess without bleeding: Secondary | ICD-10-CM | POA: Diagnosis not present

## 2017-03-16 DIAGNOSIS — Z79899 Other long term (current) drug therapy: Secondary | ICD-10-CM | POA: Diagnosis not present

## 2017-03-16 DIAGNOSIS — R1310 Dysphagia, unspecified: Secondary | ICD-10-CM | POA: Diagnosis not present

## 2017-03-16 DIAGNOSIS — I10 Essential (primary) hypertension: Secondary | ICD-10-CM | POA: Insufficient documentation

## 2017-03-16 DIAGNOSIS — K219 Gastro-esophageal reflux disease without esophagitis: Secondary | ICD-10-CM | POA: Insufficient documentation

## 2017-03-16 DIAGNOSIS — Z1211 Encounter for screening for malignant neoplasm of colon: Secondary | ICD-10-CM | POA: Diagnosis not present

## 2017-03-16 DIAGNOSIS — Z6838 Body mass index (BMI) 38.0-38.9, adult: Secondary | ICD-10-CM | POA: Insufficient documentation

## 2017-03-16 DIAGNOSIS — E785 Hyperlipidemia, unspecified: Secondary | ICD-10-CM | POA: Diagnosis not present

## 2017-03-16 DIAGNOSIS — Z86718 Personal history of other venous thrombosis and embolism: Secondary | ICD-10-CM | POA: Diagnosis not present

## 2017-03-16 DIAGNOSIS — Z8673 Personal history of transient ischemic attack (TIA), and cerebral infarction without residual deficits: Secondary | ICD-10-CM | POA: Insufficient documentation

## 2017-03-16 DIAGNOSIS — K449 Diaphragmatic hernia without obstruction or gangrene: Secondary | ICD-10-CM

## 2017-03-16 DIAGNOSIS — Z823 Family history of stroke: Secondary | ICD-10-CM | POA: Diagnosis not present

## 2017-03-16 DIAGNOSIS — K317 Polyp of stomach and duodenum: Secondary | ICD-10-CM | POA: Diagnosis not present

## 2017-03-16 DIAGNOSIS — Z8249 Family history of ischemic heart disease and other diseases of the circulatory system: Secondary | ICD-10-CM | POA: Insufficient documentation

## 2017-03-16 DIAGNOSIS — R1319 Other dysphagia: Secondary | ICD-10-CM

## 2017-03-16 DIAGNOSIS — Z833 Family history of diabetes mellitus: Secondary | ICD-10-CM | POA: Diagnosis not present

## 2017-03-16 DIAGNOSIS — G8929 Other chronic pain: Secondary | ICD-10-CM | POA: Diagnosis not present

## 2017-03-16 HISTORY — PX: COLONOSCOPY WITH PROPOFOL: SHX5780

## 2017-03-16 HISTORY — PX: ESOPHAGOGASTRODUODENOSCOPY (EGD) WITH PROPOFOL: SHX5813

## 2017-03-16 SURGERY — COLONOSCOPY WITH PROPOFOL
Anesthesia: Monitor Anesthesia Care

## 2017-03-16 MED ORDER — PROPOFOL 10 MG/ML IV BOLUS
INTRAVENOUS | Status: AC
  Filled 2017-03-16: qty 40

## 2017-03-16 MED ORDER — PROPOFOL 10 MG/ML IV BOLUS
INTRAVENOUS | Status: DC | PRN
  Administered 2017-03-16 (×9): 20 mg via INTRAVENOUS
  Administered 2017-03-16: 40 mg via INTRAVENOUS
  Administered 2017-03-16 (×5): 20 mg via INTRAVENOUS

## 2017-03-16 MED ORDER — LACTATED RINGERS IV SOLN
INTRAVENOUS | Status: DC
  Administered 2017-03-16: 11:00:00 via INTRAVENOUS

## 2017-03-16 MED ORDER — SODIUM CHLORIDE 0.9 % IV SOLN: INTRAVENOUS | Status: DC

## 2017-03-16 SURGICAL SUPPLY — 25 items

## 2017-03-16 NOTE — H&P (Signed)
[facility] History and Physical   Primary Care Physician:  Kathyrn Lass, MD   Reason for Procedure:   dysphagia, hx colon polyp and FHx colon cancer  Plan:    EGD, dilation, colonoscopy The risks and benefits as well as alternatives of endoscopic procedure(s) have been discussed and reviewed. All questions answered. The patient agrees to proceed.      HPI: [name] is a 77 y.o. female with hx esophageal dilation and now having intermittent soild dysphagia. She also has hx colon polyp and FHx CRCA. For colonoscopy and EGD   Past Medical History:  Diagnosis Date  . Allergy   . Arthritis    "got alot; especially in my spine"  . Chest pain 02/2016  . Chronic back pain    "all of it"  . Classical migraine with intractable migraine 02/25/2016  . Diverticulosis of colon (without mention of hemorrhage)   . DVT of leg (deep venous thrombosis) ([place]) 09/2011   LLE  . Dysrhythmia    notes in epic  . Esophageal reflux   . Family history of malignant neoplasm of gastrointestinal tract   . Fibromyalgia    "dx'd w/it when it first came out"  . Hiatal hernia   . Hyperlipidemia   . Hypertension   . Migraines 09/24/11   "often recently;  more controlled now"  . Obesity, unspecified   . Osteoporosis   . Palpitations    event monitor 8/13: NSR, Sinus brady, Sinus Tachy, PAC  . Personal history of colonic polyps 05/31/2007   adenomatous polyp  . Pneumonia    "3 times at least" (09/24/11)  . Rheumatoid arthritis(714.0)   . Scoliosis   . Stricture and stenosis of esophagus   . Stroke ([place]) 08/2016  . Unspecified gastritis and gastroduodenitis without mention of hemorrhage     Past Surgical History:  Procedure Laterality Date  . BACK SURGERY    . BREAST CYST INCISION AND DRAINAGE  2012   left; "golf-ball sized"  . BREAST SURGERY     cyst lanced and drainded  . COLON SURGERY    . COLONOSCOPY  2009  . ESOPHAGEAL DILATION     "several times"  . EXPLORATORY  LAPAROTOMY  04/2000   "for ruptured colon"  . HEMATOMA EVACUATION  09/2011   LLE  . LUMBAR LAMINECTOMY/DECOMPRESSION MICRODISCECTOMY  09/03/2011   Procedure: LUMBAR LAMINECTOMY/DECOMPRESSION MICRODISCECTOMY 2 LEVELS;  Surgeon: Elaina Hoops, MD;  Location: [facility];  Service: Neurosurgery;  Laterality: Left;  Left Lumbar Three-Four, Lumbar Four-Five Decompressive Laminectomy  . SALIVARY GLAND SURGERY  ~ 1980   1 removed  . SHOULDER SURGERY  2008   right shoulder and collar bone  . SIGMOID RESECTION / RECTOPEXY  09/1999   Dr Rosana Hoes  . TRIGGER FINGER RELEASE  2004   left thumb  . TUMOR REMOVAL  1960's   left leg  . UPPER GASTROINTESTINAL ENDOSCOPY      Prior to Admission medications   Medication Sig Start Date End Date Taking? Authorizing Provider  acetaminophen (TYLENOL) 500 MG tablet Take 500 mg by mouth daily as needed for moderate pain.   Yes [provider]  aspirin EC 81 MG tablet Take 81 mg by mouth 2 (two) times a week.    Yes [provider]  calcium carbonate (TUMS EX) 750 MG chewable tablet Chew 2 tablets by mouth daily as needed for heartburn.    Yes [provider]  Cyanocobalamin (VITAMIN B-12 IJ) Inject as directed every  30 (thirty) days.   Yes [provider]  Dextran 70-Hypromellose ([employer] NATURAL TEARS OP) Apply 1-2 drops to eye daily as needed (dry eyes).   Yes [provider]  fluticasone (FLONASE) 50 MCG/ACT nasal spray Place 1 spray into both nostrils daily as needed for allergies. 08/02/16  Yes [provider]  hydrochlorothiazide (HYDRODIURIL) 25 MG tablet Take 1 tablet (25 mg total) by mouth daily. 06/17/16  Yes End, Harrell Gave, MD  lisinopril (PRINIVIL,ZESTRIL) 10 MG tablet Take 1 tablet (10 mg total) by mouth every evening. 06/24/16 03/06/17 Yes End, Harrell Gave, MD  loratadine (CLARITIN) 10 MG tablet Take 10 mg by mouth daily as needed for allergies.   Yes [provider]  metoprolol tartrate (LOPRESSOR)  50 MG tablet Take 25 mg by mouth 2 (two) times daily.   Yes [provider]  neomycin-bacitracin-polymyxin (NEOSPORIN) ointment Apply 1 application topically as needed for wound care.   Yes [provider]  pantoprazole (PROTONIX) 40 MG tablet Take 1 tablet (40 mg total) by mouth 2 (two) times daily before a meal. 04/24/16  Yes Gatha Mayer, MD  polyethylene glycol (MIRALAX / GLYCOLAX) packet Take 17 g by mouth daily as needed for mild constipation.   Yes [provider]  pravastatin (PRAVACHOL) 20 MG tablet Take one (1) tablet (20 mg) by mouth each evening. 01/19/17  Yes End, Harrell Gave, MD  Sod Picosulfate-Mag Ox-Cit Acd (CLENPIQ) 10-3.5-12 MG-GM -GM/160ML SOLN Take 1 kit by mouth as directed. Clenpiq=Lot [id number], exp.08/2017 1 kit take as directed 02/25/17  Yes Gatha Mayer, MD  albuterol (PROVENTIL HFA;VENTOLIN HFA) 108 (90 Base) MCG/ACT inhaler Inhale 2 puffs into the lungs every 6 (six) hours as needed for wheezing or shortness of breath.    [provider]  promethazine (PHENERGAN) 25 MG tablet Take 1 tablet (25 mg total) by mouth every 6 (six) hours as needed for nausea or vomiting. 08/18/16   Kathrynn Ducking, MD  SUMAtriptan (IMITREX) 100 MG tablet Take 1 tablet (100 mg total) by mouth 2 (two) times daily as needed for migraine. 08/18/16   Kathrynn Ducking, MD    Current Facility-Administered Medications  Medication Dose Route Frequency Provider Last Rate Last Dose  . 0.9 %  sodium chloride infusion   Intravenous Continuous Gatha Mayer, MD      . lactated ringers infusion   Intravenous Continuous Gatha Mayer, MD 125 mL/hr at 03/16/17 1103      Allergies as of 02/12/2017 - Review Complete 09/13/2016  Allergen Reaction Noted  . Reglan [metoclopramide] Other (See Comments) 02/12/2016    Family History  Problem Relation Age of Onset  . Stroke Mother   . Hypertension Mother   . Stroke Father   . Hypertension Father   . Diabetes Father    . Pancreatic cancer Sister   . Diabetes Sister        x 2  . Colon cancer Sister 92  . Pancreatic cancer Sister   . Heart attack Neg Hx   . Stomach cancer Neg Hx     Social History   Socioeconomic History  . Marital status: Married    Spouse name: Not on file  . Number of children: 1  . 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 Highest education level: Not on file  Social Needs  . Financial resource strain: Not on file  . Food insecurity - worry: Not on file  . Food insecurity - inability: Not on file  . Transportation  needs - medical: Not on file  . Transportation needs - non-medical: Not on file  Occupational History    Employer: RETIRED  Tobacco Use  . Smoking status: Never Smoker  . Smokeless tobacco: Never Used  Substance and Sexual Activity  . Alcohol use: No  . Drug use: No  . Sexual activity: Not Currently    Partners: Male    Birth control/protection: Post-menopausal  Other Topics Concern  . Not on file  Social History Narrative   Lives at home w/ her husband   Right-handed   Caffeine: usually drinks decaf and tea    Review of Systems: All other review of systems negative except as mentioned in the HPI.  Physical Exam: Vital signs in last 24 hours: Temp:  [97.9 F (36.6 C)] 97.9 F (36.6 C) (01/07 1055) Pulse Rate:  [53] 53 (01/07 1055) Resp:  [23] 23 (01/07 1055) BP: (167)/(42) 167/42 (01/07 1055) SpO2:  [97 %] 97 % (01/07 1055) Weight:  [196 lb (88.9 kg)] 196 lb (88.9 kg) (01/07 1055)   General:   Alert,  Well-developed, well-nourished, pleasant and cooperative in NAD Lungs:  Clear throughout to auscultation.   Heart:  Regular rate and rhythm; no murmurs, clicks, rubs,  or gallops. Abdomen:  Soft, nontender and nondistended. Normal bowel sounds.   Neuro/Psych:  Alert and cooperative. Normal mood and affect. A and O x 3   '@Nyia Tsao'  Simonne Maffucci, MD, [facility] [phone] (pager) 03/16/2017 11:48 AM@

## 2017-03-16 NOTE — Op Note (Signed)
[facility] Patient Name: [name] Procedure Date: 03/16/2017 MRN: [id number] Attending MD: Gatha Mayer , MD Date of Birth: 1941/01/12 CSN: [id number] Age: 77 Admit Type: Outpatient Procedure:                Upper GI endoscopy Indications:              Dysphagia Providers:                Gatha Mayer, MD, Burtis Junes, RN, Cherylynn Ridges,                            Technician Referring MD:              Medicines:                Propofol per Anesthesia, Monitored Anesthesia Care Complications:            No immediate complications. Estimated Blood Loss:     Estimated blood loss was minimal. Procedure:                Pre-Anesthesia Assessment:                           - Prior to the procedure, a History and Physical                            was performed, and patient medications and                            allergies were reviewed. The patient's tolerance of                            previous anesthesia was also reviewed. The risks                            and benefits of the procedure and the sedation                            options and risks were discussed with the patient.                            All questions were answered, and informed consent                            was obtained. Prior Anticoagulants: The patient has                            taken no previous anticoagulant or antiplatelet                            agents. ASA Grade Assessment: III - A patient with                            severe systemic disease. After reviewing the risks  and benefits, the patient was deemed in                            satisfactory condition to undergo the procedure.                           After obtaining informed consent, the endoscope was                            passed under direct vision. Throughout the                            procedure, the patient's blood pressure, pulse, and                            oxygen  saturations were monitored continuously. The                            [id number] ([id number]) scope was introduced through the                            mouth, and advanced to the second part of duodenum.                            The upper GI endoscopy was accomplished without                            difficulty. The patient tolerated the procedure                            well. Scope In: Scope Out: Findings:      A mild Schatzki ring (acquired) was found at the gastroesophageal       junction. A TTS dilator was passed through the scope. Dilation with an       18-19-20 mm balloon dilator was performed to 18 mm. The dilation site       was examined and showed moderate improvement in luminal narrowing.       Estimated blood loss was minimal.      A small hiatal hernia was present.      Multiple small semi-sessile polyps with no stigmata of recent bleeding       were found in the gastric body.      The exam was otherwise without abnormality.      The cardia and gastric fundus were normal on retroflexion. Impression:               - Mild Schatzki ring. Dilated.                           - Small hiatal hernia.                           - Multiple gastric polyps. KNOWN FUNDIC GLAND POLYPS                           - The examination was otherwise normal.                           -  No specimens collected. Moderate Sedation:      N/A- Per Anesthesia Care Recommendation:           - Patient has a contact number available for                            emergencies. The signs and symptoms of potential                            delayed complications were discussed with the                            patient. Return to normal activities tomorrow.                            Written discharge instructions were provided to the                            patient.                           - Clear liquids x 1 hour then soft foods rest of                            day. Start prior diet  tomorrow.                           - Continue present medications.                           - See the other procedure note for documentation of                            additional recommendations. Colonoscopy next Procedure Code(s):        --- Professional ---                           [phone], Esophagogastroduodenoscopy, flexible,                            transoral; with transendoscopic balloon dilation of                            esophagus (less than 30 mm diameter) Diagnosis Code(s):        --- Professional ---                           K22.2, Esophageal obstruction                           K44.9, Diaphragmatic hernia without obstruction or                            gangrene                           K31.7, Polyp of stomach and  duodenum                           R13.10, Dysphagia, unspecified CPT copyright 2016 American Medical Association. All rights reserved. The codes documented in this report are preliminary and upon coder review may  be revised to meet current compliance requirements. Gatha Mayer, MD 03/16/2017 12:26:25 PM This report has been signed electronically. Number of Addenda: 0

## 2017-03-16 NOTE — Anesthesia Postprocedure Evaluation (Signed)
Anesthesia Post Note  Patient: PEARLINA FRIEDLY  Procedure(s) Performed: COLONOSCOPY WITH PROPOFOL (N/A ) ESOPHAGOGASTRODUODENOSCOPY (EGD) WITH PROPOFOL (N/A )     Patient location during evaluation: PACU Anesthesia Type: MAC Level of consciousness: awake and alert Pain management: pain level controlled Vital Signs Assessment: post-procedure vital signs reviewed and stable Respiratory status: spontaneous breathing, nonlabored ventilation, respiratory function stable and patient connected to nasal cannula oxygen Cardiovascular status: stable and blood pressure returned to baseline Postop Assessment: no apparent nausea or vomiting Anesthetic complications: no    Last Vitals:  Vitals:   03/16/17 1225 03/16/17 1231  BP: (!) 105/41 129/65  Pulse: 63   Resp: 17   Temp:    SpO2: 100% 100%    Last Pain:  Vitals:   03/16/17 1219  TempSrc: Oral                 Alphus Zeck EDWARD

## 2017-03-16 NOTE — Anesthesia Preprocedure Evaluation (Signed)
Anesthesia Evaluation  Patient identified by MRN, date of birth, ID band Patient awake    Reviewed: Allergy & Precautions, H&P , Patient's Chart, lab work & pertinent test results, reviewed documented beta blocker date and time   Airway Mallampati: II  TM Distance: >3 FB Neck ROM: full    Dental no notable dental hx.    Pulmonary    Pulmonary exam normal breath sounds clear to auscultation       Cardiovascular hypertension,  Rhythm:regular Rate:Normal     Neuro/Psych    GI/Hepatic   Endo/Other  Morbid obesity  Renal/GU      Musculoskeletal   Abdominal   Peds  Hematology   Anesthesia Other Findings Good LV  fxn  Reproductive/Obstetrics                             Anesthesia Physical Anesthesia Plan  ASA: III  Anesthesia Plan: MAC   Post-op Pain Management:    Induction: Intravenous  PONV Risk Score and Plan:   Airway Management Planned: Mask and Natural Airway  Additional Equipment:   Intra-op Plan:   Post-operative Plan:   Informed Consent: I have reviewed the patients History and Physical, chart, labs and discussed the procedure including the risks, benefits and alternatives for the proposed anesthesia with the patient or authorized representative who has indicated his/her understanding and acceptance.   Dental Advisory Given  Plan Discussed with: CRNA and Surgeon  Anesthesia Plan Comments:         Anesthesia Quick Evaluation

## 2017-03-16 NOTE — Discharge Instructions (Signed)
° °  I dilated a ring or narrow spot in the esophagus. You should swallow better.  Also found and removed one tiny polyp. I am not likely to recommend a routine repeat colonoscopy based upon your overall history and age. Will let you know after polyp is examined.  I appreciate the opportunity to care for you. Gatha Mayer, MD, FACG  YOU HAD AN ENDOSCOPIC PROCEDURE TODAY: Refer to the procedure report and other information in the discharge instructions given to you for any specific questions about what was found during the examination. If this information does not answer your questions, please call Dr. Celesta Aver office at [phone] to clarify.   YOU SHOULD EXPECT: Some feelings of bloating in the abdomen. Passage of more gas than usual. Walking can help get rid of the air that was put into your GI tract during the procedure and reduce the bloating. If you had a lower endoscopy (such as a colonoscopy or flexible sigmoidoscopy) you may notice spotting of blood in your stool or on the toilet paper. Some abdominal soreness may be present for a day or two, also.  DIET:   Clear liquids until 130 PM  Then soft foods  Normal foods tomorrow  ACTIVITY: Your care partner should take you home directly after the procedure. You should plan to take it easy, moving slowly for the rest of the day. You can resume normal activity the day after the procedure however YOU SHOULD NOT DRIVE, use power tools, machinery or perform tasks that involve climbing or major physical exertion for 24 hours (because of the sedation medicines used during the test).   SYMPTOMS TO REPORT IMMEDIATELY: A gastroenterologist can be reached at any hour. Please call [phone]  for any of the following symptoms:  Following lower endoscopy (colonoscopy, flexible sigmoidoscopy) Excessive amounts of blood in the stool  Significant tenderness, worsening of abdominal pains  Swelling of the abdomen that is new, acute  Fever of  100 or higher  Following upper endoscopy (EGD, EUS, ERCP, esophageal dilation) Vomiting of blood or coffee ground material  New, significant abdominal pain  New, significant chest pain or pain under the shoulder blades  Painful or persistently difficult swallowing  New shortness of breath  Black, tarry-looking or red, bloody stools  FOLLOW UP:  If any biopsies were taken you will be contacted by phone or by letter within the next 1-3 weeks. Call [phone]  if you have not heard about the biopsies in 3 weeks.  Please also call with any specific questions about appointments or follow up tests.

## 2017-03-16 NOTE — Transfer of Care (Signed)
Immediate Anesthesia Transfer of Care Note  Patient: [name]  Procedure(s) Performed: COLONOSCOPY WITH PROPOFOL (N/A ) ESOPHAGOGASTRODUODENOSCOPY (EGD) WITH PROPOFOL (N/A )  Patient Location: PACU  Anesthesia Type:MAC  Level of Consciousness: sedated  Airway & Oxygen Therapy: Patient Spontanous Breathing and Patient connected to nasal cannula oxygen  Post-op Assessment: Report given to RN and Post -op Vital signs reviewed and stable  Post vital signs: Reviewed and stable  Last Vitals:  Vitals:   03/16/17 1055  BP: (!) 167/42  Pulse: (!) 53  Resp: (!) 23  Temp: 36.6 C  SpO2: 97%    Last Pain:  Vitals:   03/16/17 1055  TempSrc: Oral         Complications: No apparent anesthesia complications

## 2017-03-16 NOTE — Op Note (Signed)
[facility] Patient Name: [name] Procedure Date: 03/16/2017 MRN: [id number] Attending MD: Gatha Mayer , MD Date of Birth: 1940-09-29 CSN: [id number] Age: 77 Admit Type: Outpatient Procedure:                Colonoscopy Indications:              Surveillance: Personal history of adenomatous                            polyps on last colonoscopy 5 years ago, Family                            history of colon cancer in a first-degree relative Providers:                Gatha Mayer, MD, Burtis Junes, RN, Cherylynn Ridges,                            Technician Referring MD:              Medicines:                Propofol per Anesthesia, Monitored Anesthesia Care Complications:            No immediate complications. Estimated Blood Loss:     Estimated blood loss was minimal. Procedure:                Pre-Anesthesia Assessment:                           - Prior to the procedure, a History and Physical                            was performed, and patient medications and                            allergies were reviewed. The patient's tolerance of                            previous anesthesia was also reviewed. The risks                            and benefits of the procedure and the sedation                            options and risks were discussed with the patient.                            All questions were answered, and informed consent                            was obtained. Prior Anticoagulants: The patient has                            taken no previous anticoagulant or antiplatelet  agents. ASA Grade Assessment: III - A patient with                            severe systemic disease. After reviewing the risks                            and benefits, the patient was deemed in                            satisfactory condition to undergo the procedure.                           - Prior to the procedure, a History and Physical                       was performed, and patient medications and                            allergies were reviewed. The patient's tolerance of                            previous anesthesia was also reviewed. The risks                            and benefits of the procedure and the sedation                            options and risks were discussed with the patient.                            All questions were answered, and informed consent                            was obtained. Prior Anticoagulants: The patient has                            taken no previous anticoagulant or antiplatelet                            agents. ASA Grade Assessment: III - A patient with                            severe systemic disease. After reviewing the risks                            and benefits, the patient was deemed in                            satisfactory condition to undergo the procedure.                           After obtaining informed consent, the colonoscope  was passed under direct vision. Throughout the                            procedure, the patient's blood pressure, pulse, and                            oxygen saturations were monitored continuously. The                            EC-[id number] ([id number]) scope was introduced through                            the anus and advanced to the the cecum, identified                            by appendiceal orifice and ileocecal valve. The                            colonoscopy was performed without difficulty. The                            patient tolerated the procedure well. The quality                            of the bowel preparation was good. The ileocecal                            valve, appendiceal orifice, and rectum were                            photographed. Scope In: 12:03:08 PM Scope Out: 12:14:07 PM Scope Withdrawal Time: 0 hours 7 minutes 36 seconds  Total Procedure Duration: 0 hours 10 minutes  59 seconds  Findings:      The perianal and digital rectal examinations were normal.      A diminutive polyp was found in the descending colon. The polyp was       sessile. The polyp was removed with a cold snare. Resection and       retrieval were complete. Verification of patient identification for the       specimen was done. Estimated blood loss was minimal.      Multiple diverticula were found in the entire colon.      The exam was otherwise without abnormality on direct and retroflexion       views. Impression:               - One diminutive polyp in the descending colon,                            removed with a cold snare. Resected and retrieved.                           - Diverticulosis in the entire examined colon.                           - The  examination was otherwise normal on direct                            and retroflexion views. Moderate Sedation:      N/A- Per Anesthesia Care Recommendation:           - Patient has a contact number available for                            emergencies. The signs and symptoms of potential                            delayed complications were discussed with the                            patient. Return to normal activities tomorrow.                            Written discharge instructions were provided to the                            patient.                           - Clear liquids x 1 hour then soft foods rest of                            day. Start prior diet tomorrow.                           - Continue present medications.                           - Repeat colonoscopy is not likely to be                            recommended at her age and prior polyp history.                            This will be determined after pathology results                            from today's exam become available for review. Procedure Code(s):        --- Professional ---                           [phone], Colonoscopy, flexible; with removal  of                            tumor(s), polyp(s), or other lesion(s) by snare                            technique Diagnosis Code(s):        --- Professional ---  Z86.010, Personal history of colonic polyps                           D12.4, Benign neoplasm of descending colon                           Z80.0, Family history of malignant neoplasm of                            digestive organs                           K57.30, Diverticulosis of large intestine without                            perforation or abscess without bleeding CPT copyright 2016 American [employer] Association. All rights reserved. The codes documented in this report are preliminary and upon coder review may  be revised to meet current compliance requirements. Gatha Mayer, MD 03/16/2017 12:30:02 PM This report has been signed electronically. Number of Addenda: 0

## 2017-03-17 ENCOUNTER — Encounter (HOSPITAL_COMMUNITY): Payer: Self-pay | Admitting: Internal Medicine

## 2017-03-18 ENCOUNTER — Encounter: Payer: Self-pay | Admitting: Internal Medicine

## 2017-03-18 NOTE — Progress Notes (Signed)
Diminutive adenoma No recall - age  My Chart letter

## 2017-03-23 ENCOUNTER — Other Ambulatory Visit: Payer: Self-pay | Admitting: Internal Medicine

## 2017-03-23 MED ORDER — LISINOPRIL 10 MG PO TABS: 10.0000 mg | ORAL_TABLET | Freq: Every evening | ORAL | 1 refills | Status: DC

## 2017-03-25 ENCOUNTER — Ambulatory Visit: Payer: Medicare Other | Admitting: Neurology

## 2017-03-25 ENCOUNTER — Encounter: Payer: Self-pay | Admitting: Neurology

## 2017-03-25 VITALS — BP 161/79 | HR 59 | Ht 60.0 in | Wt 197.5 lb

## 2017-03-25 DIAGNOSIS — G43119 Migraine with aura, intractable, without status migrainosus: Secondary | ICD-10-CM | POA: Diagnosis not present

## 2017-03-25 NOTE — Progress Notes (Signed)
Reason for visit: Migraine headaches  [name] is an 77 y.o. female  History of present illness:  [name] is a 77 year old right-handed white female with a history of migraine headaches.  She has done very well since last seen, she has only had one headache in the summer 2018 since she has been seen here.  The patient has not used much of her Imitrex or Phenergan for the headaches.  She is not on any preventative medications for her headache.  She is pleased with her headache control.  She reports no other new medical issues with exception that she had an esophageal stricture dilated recently.  She returns to the office today for evaluation.   Past Medical History:  Diagnosis Date  . Allergy   . Arthritis    "got alot; especially in my spine"  . Chest pain 02/2016  . Chronic back pain    "all of it"  . Classical migraine with intractable migraine 02/25/2016  . Diverticulosis of colon (without mention of hemorrhage)   . DVT of leg (deep venous thrombosis) ([place]) 09/2011   LLE  . Dysrhythmia    notes in epic  . Esophageal reflux   . Family history of malignant neoplasm of gastrointestinal tract   . Fibromyalgia    "dx'd w/it when it first came out"  . Hiatal hernia   . Hyperlipidemia   . Hypertension   . Migraines 09/24/11   "often recently;  more controlled now"  . Obesity, unspecified   . Osteoporosis   . Palpitations    event monitor 8/13: NSR, Sinus brady, Sinus Tachy, PAC  . Personal history of colonic polyps 05/31/2007   adenomatous polyp  . Pneumonia    "3 times at least" (09/24/11)  . Rheumatoid arthritis(714.0)   . Scoliosis   . Stricture and stenosis of esophagus   . Stroke ([place]) 08/2016  . Unspecified gastritis and gastroduodenitis without mention of hemorrhage     Past Surgical History:  Procedure Laterality Date  . BACK SURGERY    . BREAST CYST INCISION AND DRAINAGE  2012   left; "golf-ball sized"  . BREAST SURGERY     cyst lanced and  drainded  . COLON SURGERY    . COLONOSCOPY  2009  . COLONOSCOPY WITH PROPOFOL N/A 03/16/2017   Procedure: COLONOSCOPY WITH PROPOFOL;  Surgeon: Gatha Mayer, MD;  Location: [facility];  Service: Endoscopy;  Laterality: N/A;  . ESOPHAGEAL DILATION     "several times"  . ESOPHAGOGASTRODUODENOSCOPY (EGD) WITH PROPOFOL N/A 03/16/2017   Procedure: ESOPHAGOGASTRODUODENOSCOPY (EGD) WITH PROPOFOL;  Surgeon: Gatha Mayer, MD;  Location: [facility];  Service: Endoscopy;  Laterality: N/A;  . EXPLORATORY LAPAROTOMY  04/2000   "for ruptured colon"  . HEMATOMA EVACUATION  09/2011   LLE  . LUMBAR LAMINECTOMY/DECOMPRESSION MICRODISCECTOMY  09/03/2011   Procedure: LUMBAR LAMINECTOMY/DECOMPRESSION MICRODISCECTOMY 2 LEVELS;  Surgeon: Elaina Hoops, MD;  Location: [facility];  Service: Neurosurgery;  Laterality: Left;  Left Lumbar Three-Four, Lumbar Four-Five Decompressive Laminectomy  . SALIVARY GLAND SURGERY  ~ 1980   1 removed  . SHOULDER SURGERY  2008   right shoulder and collar bone  . SIGMOID RESECTION / RECTOPEXY  09/1999   Dr Rosana Hoes  . TRIGGER FINGER RELEASE  2004   left thumb  . TUMOR REMOVAL  1960's   left leg  . UPPER GASTROINTESTINAL ENDOSCOPY      Family History  Problem Relation Age of Onset  . Stroke Mother   .  Hypertension Mother   . Stroke Father   . Hypertension Father   . Diabetes Father   . Pancreatic cancer Sister   . Diabetes Sister        x 2  . Colon cancer Sister 35  . Pancreatic cancer Sister   . Heart attack Neg Hx   . Stomach cancer Neg Hx     Social history:  reports that  has never smoked. she has never used smokeless tobacco. She reports that she does not drink alcohol or use drugs.    Allergies  Allergen Reactions  . Reglan [Metoclopramide] Other (See Comments)    Speech is garbled, extreme trouble making words    Medications:  Prior to Admission medications   Medication Sig Start Date End Date Taking? Authorizing Provider  acetaminophen (TYLENOL)  500 MG tablet Take 500 mg by mouth daily as needed for moderate pain.   Yes [provider]  albuterol (PROVENTIL HFA;VENTOLIN HFA) 108 (90 Base) MCG/ACT inhaler Inhale 2 puffs into the lungs every 6 (six) hours as needed for wheezing or shortness of breath.   Yes [provider]  aspirin EC 81 MG tablet Take 81 mg by mouth 2 (two) times a week.    Yes [provider]  calcium carbonate (TUMS EX) 750 MG chewable tablet Chew 2 tablets by mouth daily as needed for heartburn.    Yes [provider]  Cyanocobalamin (VITAMIN B-12 IJ) Inject as directed every 30 (thirty) days.   Yes [provider]  Dextran 70-Hypromellose ([employer] NATURAL TEARS OP) Apply 1-2 drops to eye daily as needed (dry eyes).   Yes [provider]  fluticasone (FLONASE) 50 MCG/ACT nasal spray Place 1 spray into both nostrils daily as needed for allergies. 08/02/16  Yes [provider]  hydrochlorothiazide (HYDRODIURIL) 25 MG tablet Take 1 tablet (25 mg total) by mouth daily. 06/17/16  Yes End, Harrell Gave, MD  lisinopril (PRINIVIL,ZESTRIL) 10 MG tablet Take 1 tablet (10 mg total) by mouth every evening. 03/23/17  Yes End, Harrell Gave, MD  loratadine (CLARITIN) 10 MG tablet Take 10 mg by mouth daily as needed for allergies.   Yes [provider]  metoprolol tartrate (LOPRESSOR) 50 MG tablet Take 25 mg by mouth 2 (two) times daily.   Yes [provider]  neomycin-bacitracin-polymyxin (NEOSPORIN) ointment Apply 1 application topically as needed for wound care.   Yes [provider]  pantoprazole (PROTONIX) 40 MG tablet Take 1 tablet (40 mg total) by mouth 2 (two) times daily before a meal. 04/24/16  Yes Gatha Mayer, MD  polyethylene glycol (MIRALAX / GLYCOLAX) packet Take 17 g by mouth daily as needed for mild constipation.   Yes [provider]  pravastatin (PRAVACHOL) 20 MG tablet Take one (1) tablet (20 mg) by mouth each evening. 01/19/17   Yes End, Harrell Gave, MD  promethazine (PHENERGAN) 25 MG tablet Take 1 tablet (25 mg total) by mouth every 6 (six) hours as needed for nausea or vomiting. 08/18/16  Yes Kathrynn Ducking, MD  SUMAtriptan (IMITREX) 100 MG tablet Take 1 tablet (100 mg total) by mouth 2 (two) times daily as needed for migraine. 08/18/16  Yes Kathrynn Ducking, MD    ROS:  Out of a complete 14 system review of symptoms, the patient complains only of the following symptoms, and all other reviewed systems are negative.  Light sensitivity, double vision, blurred vision Shortness of breath Leg swelling Restless legs Joint pain, joint swelling, back pain, aching muscles,  muscle cramps, neck pain, neck stiffness  Blood pressure (!) 161/79, pulse (!) 59, height 5' (1.524 m), weight 197 lb 8 oz (89.6 kg).  Physical Exam  General: The patient is alert and cooperative at the time of the examination.  The patient is markedly obese.  Skin: No significant peripheral edema is noted.   Neurologic Exam  Mental status: The patient is alert and oriented x 3 at the time of the examination. The patient has apparent normal recent and remote memory, with an apparently normal attention span and concentration ability.   Cranial nerves: Facial symmetry is present. Speech is normal, no aphasia or dysarthria is noted. Extraocular movements are full. Visual fields are full.  Motor: The patient has good strength in all 4 extremities.  Sensory examination: Soft touch sensation is symmetric on the face, arms, and legs.  Coordination: The patient has good finger-nose-finger and heel-to-shin bilaterally.  Gait and station: The patient has a normal gait. Tandem gait is very minimally unsteady. Romberg is negative. No drift is seen.  Reflexes: Deep tendon reflexes are symmetric.   MRI brain 08/20/16:  IMPRESSION: 1. Subcentimeter focus of diffusion abnormality in the subcortical right occipital lobe which may reflect a subacute  infarct. 2. Mild chronic small vessel ischemic disease. 3. Intracranial atherosclerosis as above including severe bilateral P2 stenoses. 4. 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  * MRI scan images were reviewed online. I agree with the written report.    Assessment/Plan:  1.  Migraine headache  The patient is doing well currently.  She will continue her current medications, she will follow-up in 1 year, sooner if needed.  Jill Alexanders MD 03/25/2017 10:24 AM  [facility] [address] [place], [place] [address]  Phone [phone] Fax [phone]

## 2017-03-30 DIAGNOSIS — E538 Deficiency of other specified B group vitamins: Secondary | ICD-10-CM | POA: Diagnosis not present

## 2017-03-31 ENCOUNTER — Other Ambulatory Visit: Payer: Self-pay | Admitting: *Deleted

## 2017-03-31 MED ORDER — HYDROCHLOROTHIAZIDE 25 MG PO TABS: 25.0000 mg | ORAL_TABLET | Freq: Every day | ORAL | 1 refills | Status: DC

## 2017-04-20 ENCOUNTER — Other Ambulatory Visit: Payer: Self-pay

## 2017-04-20 ENCOUNTER — Telehealth: Payer: Self-pay | Admitting: Internal Medicine

## 2017-04-20 DIAGNOSIS — L821 Other seborrheic keratosis: Secondary | ICD-10-CM | POA: Diagnosis not present

## 2017-04-20 DIAGNOSIS — R1310 Dysphagia, unspecified: Secondary | ICD-10-CM

## 2017-04-20 DIAGNOSIS — D1801 Hemangioma of skin and subcutaneous tissue: Secondary | ICD-10-CM | POA: Diagnosis not present

## 2017-04-20 DIAGNOSIS — R1319 Other dysphagia: Secondary | ICD-10-CM

## 2017-04-20 NOTE — Telephone Encounter (Signed)
Please schedule her for a barium swallow with tablet  Dx is esophageal dysphagia

## 2017-04-20 NOTE — Telephone Encounter (Signed)
Pt scheduled for barium swallow at [facility] 04/30/17@10 :30am, pt to arrive there at 10:15am. Pt to be NPO after 6:30am. Pts husband aware of appt.

## 2017-04-20 NOTE — Telephone Encounter (Signed)
Pt states that she had an EGD in January. States she is still having issues with "having to wait for the food to go down" when she swallows. Also reports that she is having nausea in the mornings and she has to wait for it to go away. Sometimes she drinks milk and it will go away and other times she will take her protonix and it will go away. Pt wants to know what she should do. Please advise.

## 2017-04-26 ENCOUNTER — Other Ambulatory Visit: Payer: Self-pay | Admitting: Internal Medicine

## 2017-04-30 ENCOUNTER — Ambulatory Visit (HOSPITAL_COMMUNITY)
Admission: RE | Admit: 2017-04-30 | Discharge: 2017-04-30 | Disposition: A | Discharge status: Home / Self Care | Payer: Medicare Other | Source: Ambulatory Visit | Attending: Internal Medicine | Admitting: Internal Medicine

## 2017-04-30 DIAGNOSIS — R1310 Dysphagia, unspecified: Secondary | ICD-10-CM | POA: Diagnosis not present

## 2017-04-30 DIAGNOSIS — K222 Esophageal obstruction: Secondary | ICD-10-CM | POA: Insufficient documentation

## 2017-04-30 DIAGNOSIS — K449 Diaphragmatic hernia without obstruction or gangrene: Secondary | ICD-10-CM | POA: Insufficient documentation

## 2017-04-30 DIAGNOSIS — R1319 Other dysphagia: Secondary | ICD-10-CM

## 2017-04-30 NOTE — Progress Notes (Signed)
I called results Told her I need to try larger dilation and/or biopsy disruption of ring I think what the radiologist is interpreting as stricture otherwise is likely spasm  She has procedures at hospital at her request so need to set up EGD w/ dilation outpatient hospital slot

## 2017-05-01 ENCOUNTER — Other Ambulatory Visit: Payer: Self-pay

## 2017-05-01 DIAGNOSIS — R1310 Dysphagia, unspecified: Secondary | ICD-10-CM

## 2017-05-01 DIAGNOSIS — E538 Deficiency of other specified B group vitamins: Secondary | ICD-10-CM | POA: Diagnosis not present

## 2017-05-29 DIAGNOSIS — E538 Deficiency of other specified B group vitamins: Secondary | ICD-10-CM | POA: Diagnosis not present

## 2017-06-09 ENCOUNTER — Other Ambulatory Visit: Payer: Self-pay | Admitting: Internal Medicine

## 2017-06-09 ENCOUNTER — Telehealth: Payer: Self-pay | Admitting: Internal Medicine

## 2017-06-09 MED ORDER — METOPROLOL TARTRATE 50 MG PO TABS: 25.0000 mg | ORAL_TABLET | Freq: Two times a day (BID) | ORAL | 0 refills | Status: DC

## 2017-06-10 ENCOUNTER — Ambulatory Visit: Payer: Medicare Other | Admitting: Neurology

## 2017-06-10 NOTE — Telephone Encounter (Signed)
Case cancelled with central scheduling

## 2017-06-30 DIAGNOSIS — E538 Deficiency of other specified B group vitamins: Secondary | ICD-10-CM | POA: Diagnosis not present

## 2017-07-18 IMAGING — CT CT HEAD W/O CM
4 series · 16 of 47 positions shown, 18 images · non-contrast
Comparison: CT of the head and MRI of the brain performed
01/09/2016

CLINICAL DATA: Symptoms of cerebrovascular accident. Initial
encounter.

EXAM:
CT HEAD WITHOUT CONTRAST
TECHNIQUE: Contiguous axial images were obtained from the base of the skull
through the vertex without intravenous contrast.

[Series 2: head without · axial · non-contrast · 0.46mm/px · z∈[-66,+54]mm · 7 of 33 slices shown, 9 images]
[im 5/33  brain]
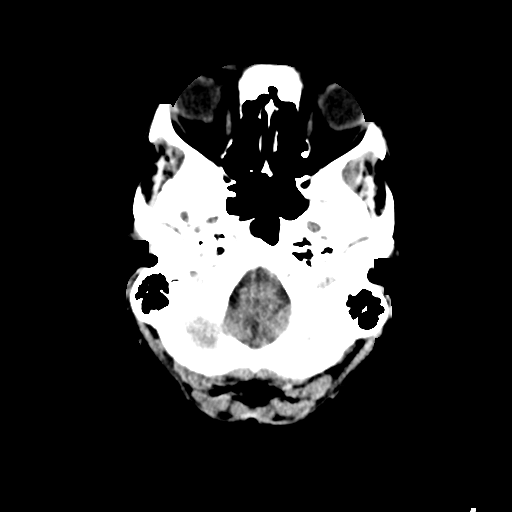
[im 5/33  bone]
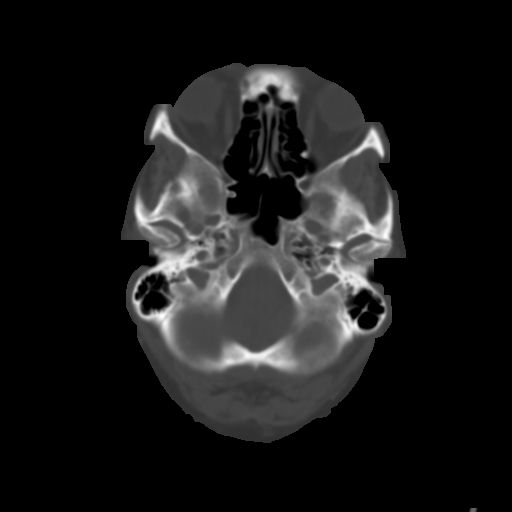
[im 9/33  brain]
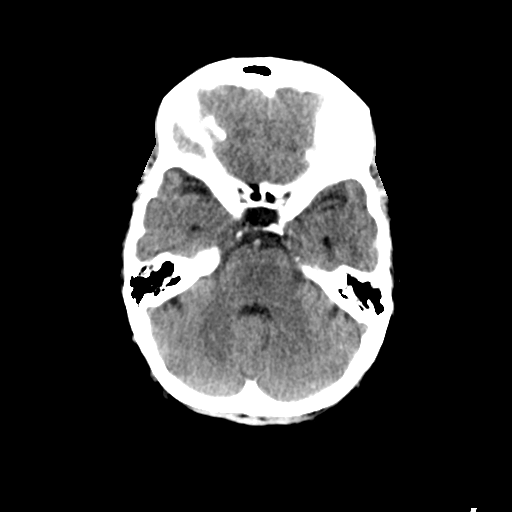
[im 13/33  brain]
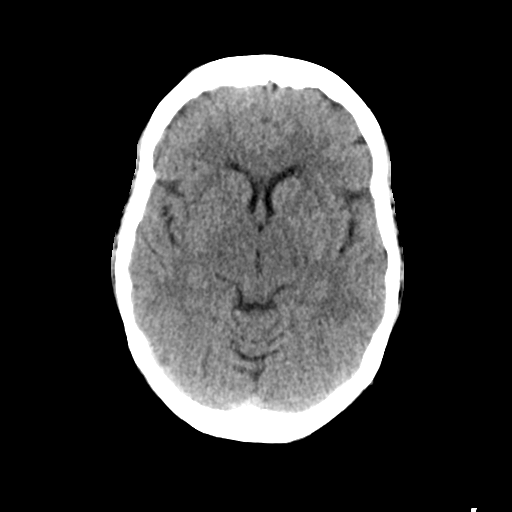
[im 17/33  brain]
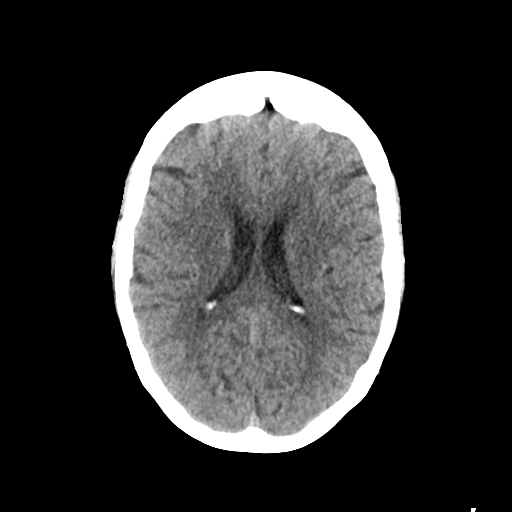
[im 21/33  brain]
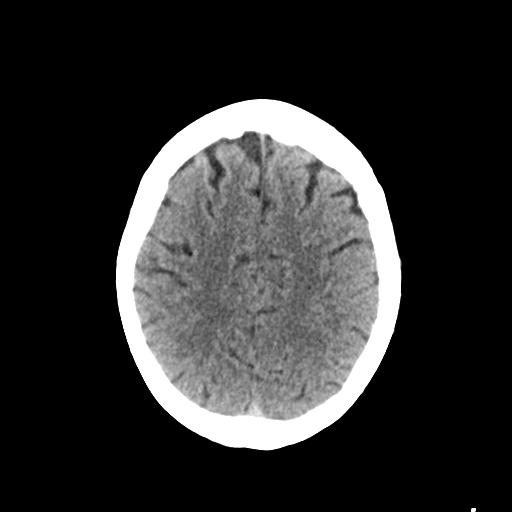
[im 21/33  bone]
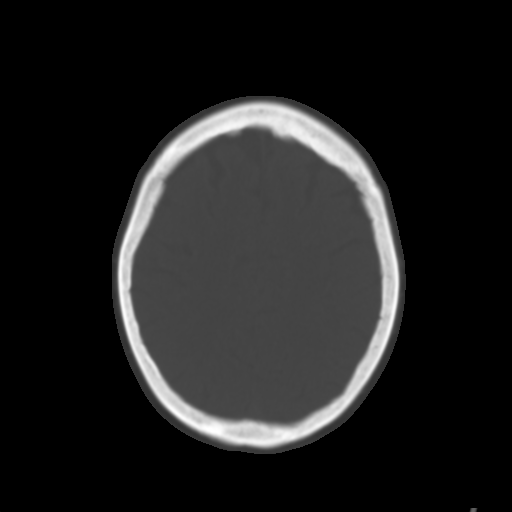
[im 25/33  brain]
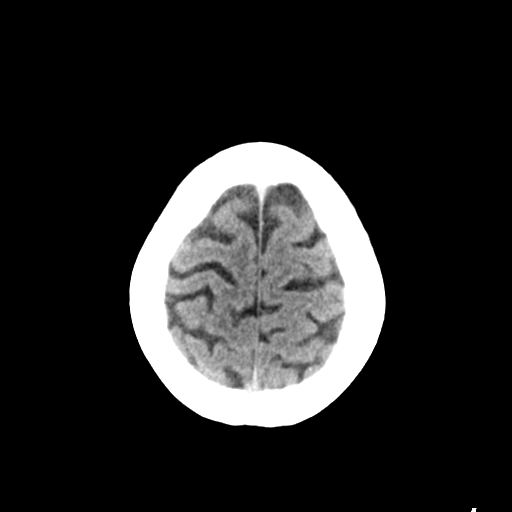
[im 29/33  brain]
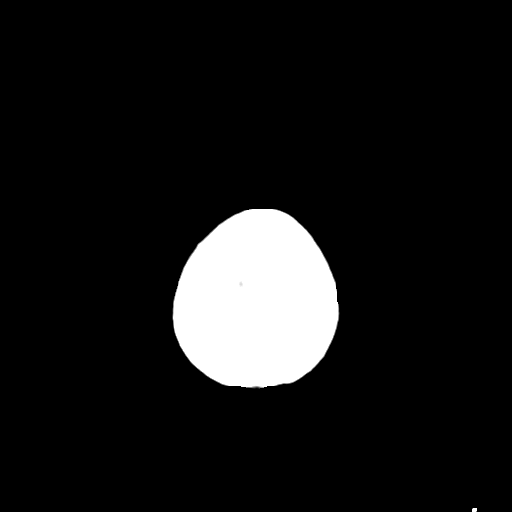

[Series 3: head bone · axial · 0.46mm/px · z∈[-70,-38]mm · 3 of 81 slices shown]
[im 9/81  bone]
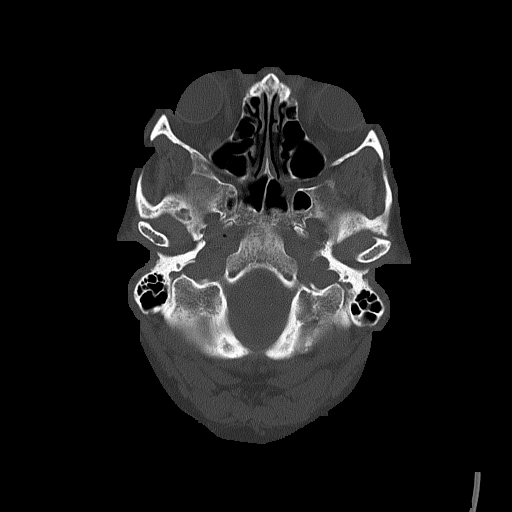
[im 17/81  bone]
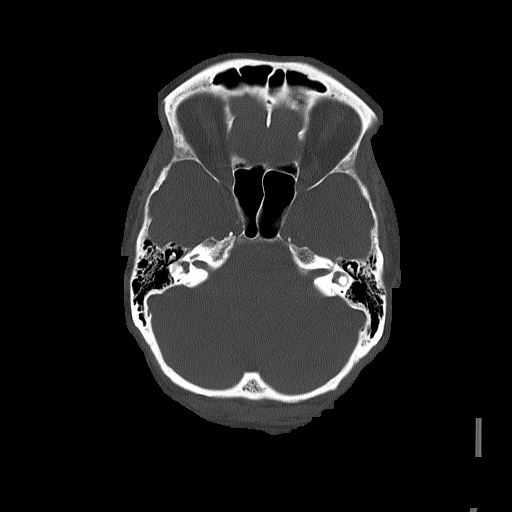
[im 25/81  bone]
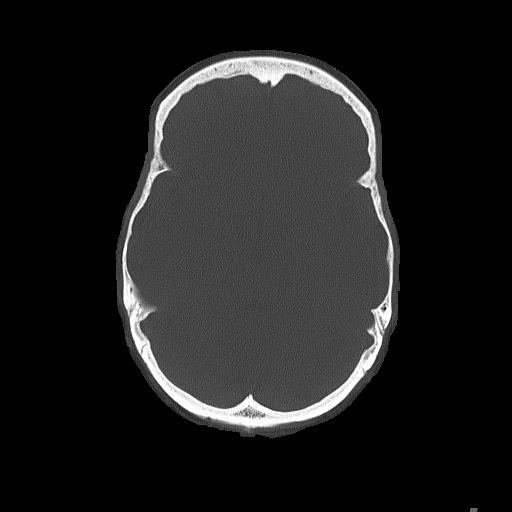

[Series 4: head without cor · coronal · non-contrast · 0.32mm/px · 3 of 66 slices shown]
[im 22/66  brain]
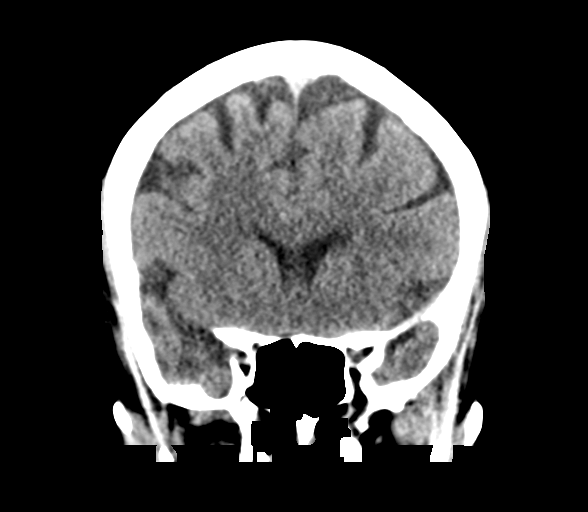
[im 29/66  brain]
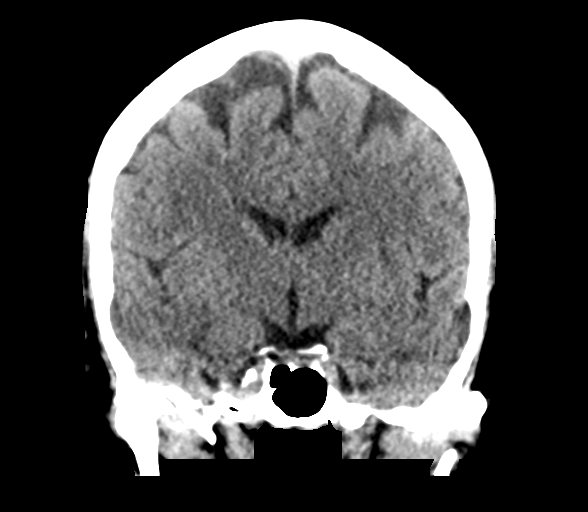
[im 37/66  brain]
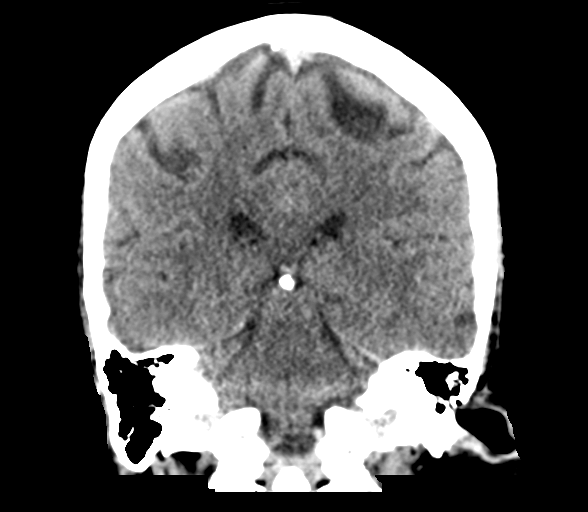

[Series 5: head without sag · sagittal · non-contrast · 0.32mm/px · 3 of 57 slices shown]
[im 19/57  brain]
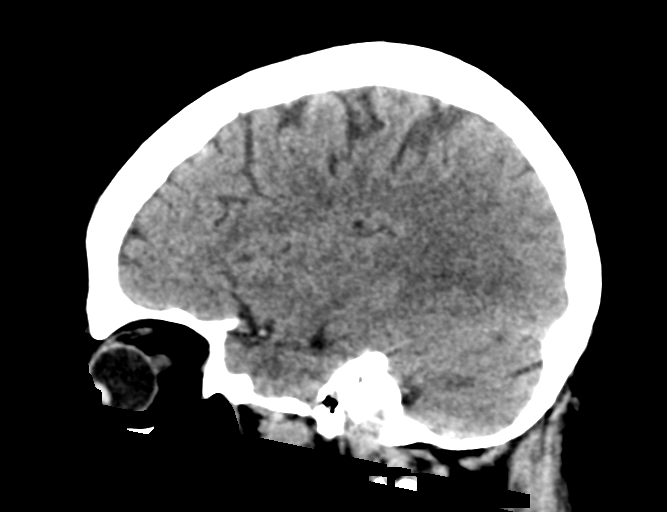
[im 29/57  brain]
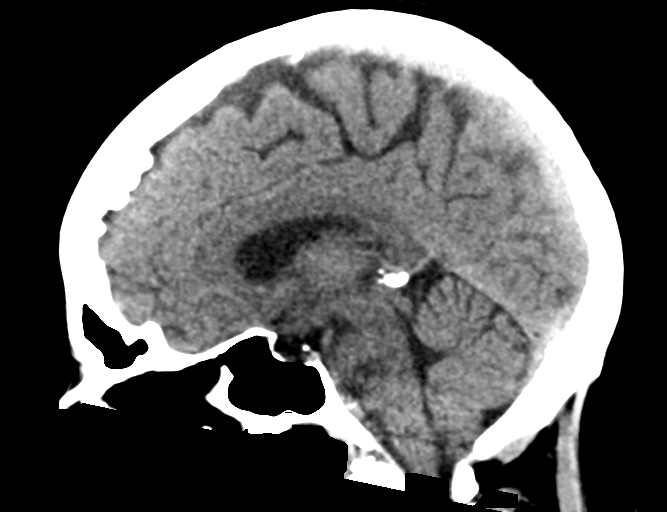
[im 38/57  brain]
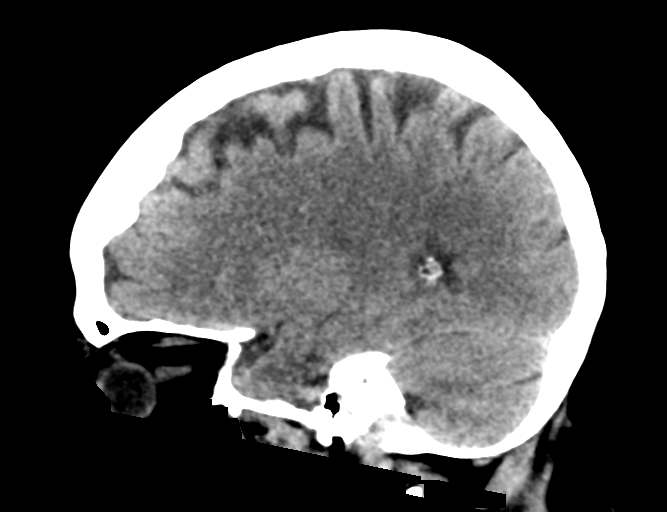

[16 of 47 positions shown; findings below may reference images not displayed]

FINDINGS: Brain: No evidence of acute infarction, hemorrhage, hydrocephalus,
extra-axial collection or mass lesion/mass effect.

Mild periventricular and subcortical white matter change may reflect
small vessel ischemic microangiopathy.

The posterior fossa, including the cerebellum, brainstem and fourth
ventricle, is within normal limits. The third and lateral
ventricles, and basal ganglia are unremarkable in appearance. The
cerebral hemispheres are symmetric in appearance, with normal
gray-white differentiation. No mass effect or midline shift is seen.

Vascular: No hyperdense vessel or unexpected calcification.

Skull: There is no evidence of fracture; visualized osseous
structures are unremarkable in appearance.

Sinuses/Orbits: The visualized portions of the orbits are within
normal limits. The paranasal sinuses and mastoid air cells are
well-aerated.

Other: No significant soft tissue abnormalities are seen.
IMPRESSION: 1. No acute intracranial pathology seen on CT.
2. Mild small vessel ischemic microangiopathy.

## 2017-07-20 ENCOUNTER — Encounter (HOSPITAL_COMMUNITY): Payer: Self-pay

## 2017-07-20 ENCOUNTER — Ambulatory Visit (HOSPITAL_COMMUNITY): Admit: 2017-07-20 | Payer: Medicare Other | Admitting: Internal Medicine

## 2017-07-20 SURGERY — ESOPHAGOGASTRODUODENOSCOPY (EGD) WITH PROPOFOL
Anesthesia: Monitor Anesthesia Care

## 2017-07-23 ENCOUNTER — Other Ambulatory Visit: Payer: Self-pay | Admitting: Internal Medicine

## 2017-07-23 DIAGNOSIS — E7800 Pure hypercholesterolemia, unspecified: Secondary | ICD-10-CM

## 2017-07-23 NOTE — Telephone Encounter (Signed)
Refill Request.  

## 2017-07-27 DIAGNOSIS — D485 Neoplasm of uncertain behavior of skin: Secondary | ICD-10-CM | POA: Diagnosis not present

## 2017-07-27 DIAGNOSIS — L821 Other seborrheic keratosis: Secondary | ICD-10-CM | POA: Diagnosis not present

## 2017-07-31 DIAGNOSIS — E538 Deficiency of other specified B group vitamins: Secondary | ICD-10-CM | POA: Diagnosis not present

## 2017-08-14 DIAGNOSIS — B029 Zoster without complications: Secondary | ICD-10-CM | POA: Diagnosis not present

## 2017-08-14 DIAGNOSIS — H00015 Hordeolum externum left lower eyelid: Secondary | ICD-10-CM | POA: Diagnosis not present

## 2017-08-14 DIAGNOSIS — H00012 Hordeolum externum right lower eyelid: Secondary | ICD-10-CM | POA: Diagnosis not present

## 2017-09-04 DIAGNOSIS — E538 Deficiency of other specified B group vitamins: Secondary | ICD-10-CM | POA: Diagnosis not present

## 2017-09-16 DIAGNOSIS — L299 Pruritus, unspecified: Secondary | ICD-10-CM | POA: Diagnosis not present

## 2017-09-16 DIAGNOSIS — R21 Rash and other nonspecific skin eruption: Secondary | ICD-10-CM | POA: Diagnosis not present

## 2017-10-07 DIAGNOSIS — E538 Deficiency of other specified B group vitamins: Secondary | ICD-10-CM | POA: Diagnosis not present

## 2017-11-11 ENCOUNTER — Other Ambulatory Visit: Payer: Self-pay | Admitting: Internal Medicine

## 2017-11-11 NOTE — Telephone Encounter (Signed)
Refill Request.  

## 2017-11-12 ENCOUNTER — Other Ambulatory Visit: Payer: Self-pay | Admitting: Internal Medicine

## 2017-11-12 DIAGNOSIS — E7800 Pure hypercholesterolemia, unspecified: Secondary | ICD-10-CM

## 2017-11-12 DIAGNOSIS — E538 Deficiency of other specified B group vitamins: Secondary | ICD-10-CM | POA: Diagnosis not present

## 2017-11-14 ENCOUNTER — Other Ambulatory Visit: Payer: Self-pay | Admitting: Internal Medicine

## 2017-11-16 NOTE — Telephone Encounter (Signed)
Refill Request.  

## 2017-11-17 ENCOUNTER — Telehealth: Payer: Self-pay | Admitting: Internal Medicine

## 2017-11-17 NOTE — Telephone Encounter (Signed)
° °  Patient made aware Dr End will transition to [facility] Jan 2020

## 2017-11-23 DIAGNOSIS — Z0000 Encounter for general adult medical examination without abnormal findings: Secondary | ICD-10-CM | POA: Diagnosis not present

## 2017-11-23 DIAGNOSIS — Z6841 Body Mass Index (BMI) 40.0 and over, adult: Secondary | ICD-10-CM | POA: Diagnosis not present

## 2017-11-23 DIAGNOSIS — Z23 Encounter for immunization: Secondary | ICD-10-CM | POA: Diagnosis not present

## 2017-11-23 DIAGNOSIS — Z1389 Encounter for screening for other disorder: Secondary | ICD-10-CM | POA: Diagnosis not present

## 2017-12-10 ENCOUNTER — Other Ambulatory Visit: Payer: Self-pay | Admitting: Neurology

## 2017-12-10 ENCOUNTER — Telehealth: Payer: Self-pay | Admitting: Neurology

## 2017-12-10 MED ORDER — PROMETHAZINE HCL 25 MG PO TABS: 25.0000 mg | ORAL_TABLET | Freq: Four times a day (QID) | ORAL | 2 refills | Status: DC | PRN

## 2017-12-10 MED ORDER — SUMATRIPTAN SUCCINATE 100 MG PO TABS: ORAL_TABLET | ORAL | 3 refills | Status: DC

## 2017-12-10 NOTE — Addendum Note (Signed)
Addended by: Rossie Muskrat L on: 12/10/2017 01:04 PM   Modules accepted: Orders

## 2017-12-10 NOTE — Telephone Encounter (Signed)
Pt has called to inform that she did make a request for the refilling of her 2 medications promethazine (PHENERGAN) 25 MG tablet and SUMAtriptan (IMITREX) 100 MG tablet but only because they aged out.  Pt has not had to use the medications she has been feeling fine but she wants to keep them as a "crutch".  Pt wants Dr Jannifer Franklin to know this is the only reason she has requested they be refilled.  This is a FYI, she has not requested a call back unless RN has questions re: this request.

## 2017-12-10 NOTE — Telephone Encounter (Signed)
E-scribed refills for sumatriptan and phenergan to pharmacy as requested.

## 2017-12-13 ENCOUNTER — Other Ambulatory Visit: Payer: Self-pay | Admitting: Internal Medicine

## 2017-12-14 DIAGNOSIS — E538 Deficiency of other specified B group vitamins: Secondary | ICD-10-CM | POA: Diagnosis not present

## 2017-12-14 NOTE — Telephone Encounter (Signed)
Refill Request.  

## 2017-12-25 ENCOUNTER — Encounter: Payer: Self-pay | Admitting: Registered"

## 2017-12-25 ENCOUNTER — Encounter: Payer: Medicare Other | Attending: Family Medicine | Admitting: Registered"

## 2017-12-25 DIAGNOSIS — R252 Cramp and spasm: Secondary | ICD-10-CM | POA: Diagnosis not present

## 2017-12-25 DIAGNOSIS — E119 Type 2 diabetes mellitus without complications: Secondary | ICD-10-CM | POA: Insufficient documentation

## 2017-12-25 DIAGNOSIS — Z713 Dietary counseling and surveillance: Secondary | ICD-10-CM | POA: Diagnosis not present

## 2017-12-25 DIAGNOSIS — E1169 Type 2 diabetes mellitus with other specified complication: Secondary | ICD-10-CM | POA: Diagnosis not present

## 2017-12-25 NOTE — Progress Notes (Signed)
Diabetes Self-Management Education  Visit Type: First/Initial  Appt. Start Time: 1025 Appt. End Time: 1155  12/25/2017  [name], identified by name and date of birth, is a 77 y.o. female with a diagnosis of Diabetes: Type 2.   ASSESSMENT Pt states she wants to hold out starting medication until she has a chance to make some changes (and lose weight). Patient has been reading a lot on the internet to learn about T2DM. Pt states she wants to check her BG to know if she is making progress before her next A1c in January.  Patient states she has always enjoyed [employer], was on her desk at all times (retired 2009) It has been hard but patient has cut way back on soda, but still eats ice cream at night often. Patient believes that if its good leave it alone (doesn't like green beans, but eats them) states she is a good cook and is motivated to learn to cook her own vegetables. Pt wanted a lot of detail on what to eat. Just went over basics of balance today. If she is still interested we will do carb counting at next visit.  Pt states her husband is on dialysis and they have to adjust their diet for his needs as well.   Pt had to stand several times during visit due to foot falling asleep.   Meter provided: Accu-check Guide Me Lot [id number] Exp: 01/26/19 BG 168 mg/dL  Diabetes Self-Management Education - 12/25/17 1029      Visit Information   Visit Type  First/Initial      Initial Visit   Diabetes Type  Type 2    Are you currently following a meal plan?  Yes    Are you taking your medications as prescribed?  Not on Medications    Date Diagnosed  12/2017      Psychosocial Assessment   Patient Belief/Attitude about Diabetes  Motivated to manage diabetes    How often do you need to have someone help you when you read instructions, pamphlets, or other written materials from your doctor or pharmacy?  1 - Never    What is the last grade level you completed in school?  12 +      Complications   Last HgB A1C per patient/outside source  6.7 %   per referral   How often do you check your blood sugar?  0 times/day (not testing)    Have you had a dilated eye exam in the past 12 months?  No    Have you had a dental exam in the past 12 months?  No    Are you checking your feet?  Yes    How many days per week are you checking your feet?  7      Dietary Intake   Breakfast  eggs, sausage, mcmuffin, coffee OR none if busy    Lunch  banana OR sandwich (don't think about it)     Snack (afternoon)  none    Dinner  4 pm ghassans steak sandwich OR hamburger (loves) OR meatloaf, green beans, corn, mashed potatoes OR mac & cheese    Snack (evening)  ice cream    Beverage(s)  coffee, sugar free coke, water mostly, sugarfree tea,       Exercise   Exercise Type  ADL's    How many days per week to you exercise?  0    How many minutes per day do you exercise?  0    Total  minutes per week of exercise  0      Patient Education   Previous Diabetes Education  No    Disease state   Definition of diabetes, type 1 and 2, and the diagnosis of diabetes    Nutrition management   Role of diet in the treatment of diabetes and the relationship between the three main macronutrients and blood glucose level    Monitoring  Taught/evaluated SMBG meter.    Acute complications  Taught treatment of hypoglycemia - the 15 rule.    Psychosocial adjustment  Role of stress on diabetes      Individualized Goals (developed by patient)   Nutrition  General guidelines for healthy choices and portions discussed    Monitoring   test my blood glucose as discussed      Outcomes   Expected Outcomes  Demonstrated interest in learning. Expect positive outcomes    Future DMSE  4-6 wks    Program Status  Not Completed     Individualized Plan for Diabetes Self-Management Training:   Learning Objective:  Patient will have a greater understanding of diabetes self-management. Patient education plan is to attend  individual and/or group sessions per assessed needs and concerns.  Patient Instructions  Because you taking lisinopril ask you doctor or pharmacist about salt substitutes that have potassium  Great job on the changes you have started making including cutting back your concentrated sweet intake with soda and desserts.  You can check your blood sugar fasting and 2 hours after meals to see if you are having hypoglycemia and how your meals are affecting your blood sugar. Keep a log of your blood sugar and bring to your next appointment.  Aim to eat balanced meals and snacks following MyPlate as a guide.  Look into services offered through [employer] [facility] [address]  Expected Outcomes:  Demonstrated interest in learning. Expect positive outcomes  Education material provided: A1C conversion sheet, My Plate and Carbohydrate counting sheet, flyer from [employer] [facility]  If problems or questions, patient to contact team via:  Best boy  Future DSME appointment: 4-6 wks

## 2017-12-25 NOTE — Patient Instructions (Addendum)
Because you taking lisinopril ask you doctor or pharmacist about salt substitutes that have potassium  Great job on the changes you have started making including cutting back your concentrated sweet intake with soda and desserts.  You can check your blood sugar fasting and 2 hours after meals to see if you are having hypoglycemia and how your meals are affecting your blood sugar. Keep a log of your blood sugar and bring to your next appointment.  Aim to eat balanced meals and snacks following MyPlate as a guide.  Look into services offered through [employer] [facility] [place]

## 2017-12-29 DIAGNOSIS — E119 Type 2 diabetes mellitus without complications: Secondary | ICD-10-CM | POA: Diagnosis not present

## 2018-01-14 DIAGNOSIS — E538 Deficiency of other specified B group vitamins: Secondary | ICD-10-CM | POA: Diagnosis not present

## 2018-01-25 ENCOUNTER — Encounter: Payer: Self-pay | Admitting: Internal Medicine

## 2018-01-25 ENCOUNTER — Ambulatory Visit: Payer: Medicare Other | Admitting: Internal Medicine

## 2018-01-25 VITALS — BP 136/70 | HR 56 | Ht 60.0 in | Wt 190.4 lb

## 2018-01-25 DIAGNOSIS — I10 Essential (primary) hypertension: Secondary | ICD-10-CM | POA: Diagnosis not present

## 2018-01-25 DIAGNOSIS — E785 Hyperlipidemia, unspecified: Secondary | ICD-10-CM | POA: Diagnosis not present

## 2018-01-25 DIAGNOSIS — M79604 Pain in right leg: Secondary | ICD-10-CM | POA: Diagnosis not present

## 2018-01-25 DIAGNOSIS — M79605 Pain in left leg: Secondary | ICD-10-CM

## 2018-01-25 DIAGNOSIS — R0609 Other forms of dyspnea: Secondary | ICD-10-CM

## 2018-01-25 DIAGNOSIS — R0600 Dyspnea, unspecified: Secondary | ICD-10-CM

## 2018-01-25 NOTE — Progress Notes (Signed)
Follow-up Outpatient Visit Date: 01/25/2018  Primary Care Provider: Kathyrn Lass, MD [place] [address]  Chief Complaint: Shortness of breath and leg cramps  HPI:  Ms. Kirstein is a 77 y.o. year-old female with history of HTN, GERD, autonomic dysfunction, and migraine headaches, who presents for follow-up of chest pain.  I last saw her in 09/2016, at which time she was doing well from a heart standpoint.  She reported stable dyspnea on exertion and no further chest pain.  We did not make any medication changes or pursue further testing at that time.  Today, Ms. Altidor reports that she has been having sporadic cramps in her legs and feet for several months.  They most often happen at night.  She has tried topical magnesium sulfate without much relief.  She would also like to begin taking a magnesium, calcium, and zinc supplement if it helps.  She has not had any chest pain.  She notes chronic exertional dyspnea, though her activity is most limited by pain in her low back.  She denies palpitations, lightheadedness, and edema.  She is tolerating her medications well.  She notes that her blood pressures are typically above 130s often around 145 mmHg.  She has been trying to lose weight through portion control.  --------------------------------------------------------------------------------------------------  Cardiovascular History & Procedures: Cardiovascular Problems:  Hypertension  Autonomic dysfunction  Chest wall pain  Risk Factors:  Hypertension and age > 67  Cath/PCI:  None  CV Surgery:  None  EP Procedures and Devices:  None  Non-Invasive Evaluation(s):  TTE (02/13/16): Normal LV size with mild LVH. LVEF 50-55%. Normal RV size and function. No significant valvular abnormalities.  LE arterial Doppler (02/04/13): Normal ABIs/TBIs  Pharmacologic myocardial perfusion stress test (05/08/11): Normal study without ischemia or scar. LVEF  80%.  Recent CV Pertinent Labs: Lab Results  Component Value Date   CHOL 173 04/25/2016   HDL 55 04/25/2016   LDLCALC 92 04/25/2016   TRIG 129 04/25/2016   CHOLHDL 3.1 04/25/2016   CHOLHDL 4.5 06/06/2007   INR 0.94 08/18/2016   BNP 127.4 (H) 02/12/2016   K 3.5 08/20/2016   MG 2.1 01/21/2016   BUN 10 08/20/2016   CREATININE 0.98 08/20/2016   CREATININE 0.93 01/21/2016    Past medical and surgical history were reviewed and updated in EPIC.  Current Meds  Medication Sig  . acetaminophen (TYLENOL) 500 MG tablet Take 500 mg by mouth daily as needed for moderate pain.  [name] albuterol (PROVENTIL HFA;VENTOLIN HFA) 108 (90 Base) MCG/ACT inhaler Inhale 2 puffs into the lungs every 6 (six) hours as needed for wheezing or shortness of breath.  [name] aspirin EC 81 MG tablet Take 81 mg by mouth 2 (two) times a week.   . calcium carbonate (TUMS EX) 750 MG chewable tablet Chew 2 tablets by mouth daily as needed for heartburn.   . Cyanocobalamin (VITAMIN B-12 IJ) Inject as directed every 30 (thirty) days.  . Dextran 70-Hypromellose ([employer] NATURAL TEARS OP) Apply 1-2 drops to eye daily as needed (dry eyes).  . fluticasone (FLONASE) 50 MCG/ACT nasal spray Place 1 spray into both nostrils daily as needed for allergies.  . hydrochlorothiazide (HYDRODIURIL) 25 MG tablet TAKE 1 TABLET BY MOUTH DAILY  . lisinopril (PRINIVIL,ZESTRIL) 10 MG tablet Take 1 tablet (10 mg total) by mouth daily. Please keep upcoming appt in December with Dr. Saunders Revel for future refills. Thank you  . loratadine (CLARITIN) 10 MG tablet Take 10 mg by mouth daily  as needed for allergies.  . metoprolol tartrate (LOPRESSOR) 50 MG tablet Take 0.5 tablets (25 mg total) by mouth 2 (two) times daily. Please make yearly appt with Dr.Tameria Patti for July before anymore refills.1st attempt  . neomycin-bacitracin-polymyxin (NEOSPORIN) ointment Apply 1 application topically as needed for wound care.  . pantoprazole (PROTONIX) 40 MG tablet TAKE 1 TABLET(40 MG) BY  MOUTH TWICE DAILY BEFORE A MEAL  . polyethylene glycol (MIRALAX / GLYCOLAX) packet Take 17 g by mouth daily as needed for mild constipation.  . pravastatin (PRAVACHOL) 20 MG tablet TAKE ONE (1) TABLET (20 MG) BY MOUTH EACH EVENING.  . promethazine (PHENERGAN) 25 MG tablet Take 1 tablet (25 mg total) by mouth every 6 (six) hours as needed for nausea or vomiting.  . SUMAtriptan (IMITREX) 100 MG tablet Take 1 tablet at onset of migraine. Can take 1 additional tablet in 2 hours once if needed. Do not take more than 2 tablets in 24 hours.    Allergies: Reglan [metoclopramide]  Social History   Tobacco Use  . Smoking status: Never Smoker  . Smokeless tobacco: Never Used  Substance Use Topics  . Alcohol use: No  . Drug use: No    Family History  Problem Relation Age of Onset  . Stroke Mother   . Hypertension Mother   . Stroke Father   . Hypertension Father   . Diabetes Father   . Pancreatic cancer Sister   . Diabetes Sister        x 2  . Colon cancer Sister 28  . Pancreatic cancer Sister   . Heart attack Neg Hx   . Stomach cancer Neg Hx     Review of Systems: A 12-system review of systems was performed and was negative except as noted in the HPI.  --------------------------------------------------------------------------------------------------  Physical Exam: BP 136/70   Pulse (!) 56   Ht 5' (1.524 m)   Wt 190 lb 6.4 oz (86.4 kg)   LMP  (LMP Unknown)   BMI 37.18 kg/m   General: NAD. HEENT: No conjunctival pallor or scleral icterus. Moist mucous membranes.  OP clear. Neck: Supple without lymphadenopathy, thyromegaly, JVD, or HJR. Lungs: Normal work of breathing. Clear to auscultation bilaterally without wheezes or crackles. Heart: Bradycardic but regular without murmurs, rubs, or gallops.  Unable to assess PMI due to body habitus. Abd: Bowel sounds present. Soft, NT/ND.  Unable to assess HSM due to body habitus. Ext: No lower extremity edema. Radial, PT, and DP pulses  are 2+ bilaterally. Skin: Warm and dry without rash.  EKG: Baseline wander noted.  Sinus bradycardia with poor R wave progression that may reflect lead placement.  Lab Results  Component Value Date   WBC 5.9 08/20/2016   HGB 12.5 08/20/2016   HCT 38.9 08/20/2016   MCV 86.8 08/20/2016   PLT 172 08/20/2016    Lab Results  Component Value Date   NA 140 08/20/2016   K 3.5 08/20/2016   CL 110 08/20/2016   CO2 23 08/20/2016   BUN 10 08/20/2016   CREATININE 0.98 08/20/2016   GLUCOSE 160 (H) 08/20/2016   ALT 10 (L) 08/20/2016    Lab Results  Component Value Date   CHOL 173 04/25/2016   HDL 55 04/25/2016   LDLCALC 92 04/25/2016   TRIG 129 04/25/2016   CHOLHDL 3.1 04/25/2016    --------------------------------------------------------------------------------------------------  ASSESSMENT AND PLAN: Dyspnea on exertion Long-standing and likely multifactorial.  Prior cardiac work-up unrevealing.  No further chest pain reported since last visit.  Defer further work-up at this time.  I encouraged continued weight loss and exercise.  I will check a CBC and complete metabolic panel at the patient's convenience.  Hypertension Blood pressure mildly elevated today.  We discussed increasing lisinopril, though Ms. Ivins would like to defer this in favor of home blood pressure monitoring and lifestyle modification.  I encouraged her to call us if she has consistent blood pressure readings above 130/80.  Hyperlipidemia LDL 92 on last check in 04/2016.  I will check a fasting lipid panel and CMP at the patient's convenience.  We will hold pravastatin for 1 month to see if her leg cramps improve.  Leg pain Pain is most consistent with muscle cramps.  We have agreed to try a statin holiday for 1 month to see if her discomfort improves.  She will contact us with the outcome.  Follow-up: Return to clinic in 6 months with Richardson Dopp, PA.  Nelva Bush, MD 01/25/2018 12:24 PM

## 2018-01-25 NOTE — Patient Instructions (Signed)
Medication Instructions:  HOLD Pravastatin for 1 month, let us know about leg pain.  If persists, please continue. If you need a refill on your cardiac medications before your next appointment, please call your pharmacy.   Lab work:TODAY CMP/CBC/  Thursday Lipid   If you have labs (blood work) drawn today and your tests are completely normal, you will receive your results only by: [name] MyChart Message (if you have MyChart) OR . A paper copy in the mail If you have any lab test that is abnormal or we need to change your treatment, we will call you to review the results.  Testing/Procedures: NONE  Follow-Up: At [facility], you and your health needs are our priority.  As part of our continuing mission to provide you with exceptional heart care, we have created designated Provider Care Teams.  These Care Teams include your primary Cardiologist (physician) and Advanced Practice Providers (APPs -  Physician Assistants and Nurse Practitioners) who all work together to provide you with the care you need, when you need it. You will need a follow up appointment in:  6 months with Richardson Dopp.  Please call our office 2 months in advance to schedule this appointment.  You may see  or one of the following Advanced Practice Providers on your designated Care Team: Long Term Dr Acie Fredrickson Any Other Special Instructions Will Be Listed Below (If Applicable).

## 2018-01-26 LAB — COMPREHENSIVE METABOLIC PANEL
ALK PHOS: 61 IU/L (ref 39–117)
ALT: 11 IU/L (ref 0–32)
AST: 24 IU/L (ref 0–40)
Albumin/Globulin Ratio: 1.8 (ref 1.2–2.2)
Albumin: 4.4 g/dL (ref 3.5–4.8)
BILIRUBIN TOTAL: 0.8 mg/dL (ref 0.0–1.2)
BUN / CREAT RATIO: 13 (ref 12–28)
BUN: 12 mg/dL (ref 8–27)
CHLORIDE: 98 mmol/L (ref 96–106)
CO2: 20 mmol/L (ref 20–29)
Calcium: 9.7 mg/dL (ref 8.7–10.3)
Creatinine, Ser: 0.89 mg/dL (ref 0.57–1.00)
GFR calc Af Amer: 73 mL/min/{1.73_m2} (ref >59)
GFR calc non Af Amer: 63 mL/min/{1.73_m2} (ref >59)
GLUCOSE: 89 mg/dL (ref 65–99)
Globulin, Total: 2.4 g/dL (ref 1.5–4.5)
Potassium: 4.6 mmol/L (ref 3.5–5.2)
Sodium: 140 mmol/L (ref 134–144)
Total Protein: 6.8 g/dL (ref 6.0–8.5)

## 2018-01-26 LAB — CBC
HEMATOCRIT: 45.1 % (ref 34.0–46.6)
HEMOGLOBIN: 15 g/dL (ref 11.1–15.9)
MCH: 28.4 pg (ref 26.6–33.0)
MCHC: 33.3 g/dL (ref 31.5–35.7)
MCV: 85 fL (ref 79–97)
Platelets: 296 10*3/uL (ref 150–450)
RBC: 5.29 x10E6/uL — AB (ref 3.77–5.28)
RDW: 13.2 % (ref 12.3–15.4)
WBC: 6.3 10*3/uL (ref 3.4–10.8)

## 2018-01-27 ENCOUNTER — Other Ambulatory Visit: Payer: Self-pay | Admitting: Internal Medicine

## 2018-01-28 ENCOUNTER — Other Ambulatory Visit: Payer: Medicare Other | Admitting: *Deleted

## 2018-01-28 ENCOUNTER — Encounter: Payer: Medicare Other | Attending: Family Medicine | Admitting: Registered"

## 2018-01-28 DIAGNOSIS — Z713 Dietary counseling and surveillance: Secondary | ICD-10-CM | POA: Diagnosis not present

## 2018-01-28 DIAGNOSIS — R0609 Other forms of dyspnea: Secondary | ICD-10-CM | POA: Diagnosis not present

## 2018-01-28 DIAGNOSIS — E1169 Type 2 diabetes mellitus with other specified complication: Secondary | ICD-10-CM | POA: Insufficient documentation

## 2018-01-28 DIAGNOSIS — R0600 Dyspnea, unspecified: Secondary | ICD-10-CM

## 2018-01-28 DIAGNOSIS — E119 Type 2 diabetes mellitus without complications: Secondary | ICD-10-CM

## 2018-01-28 LAB — LIPID PANEL
CHOLESTEROL TOTAL: 178 mg/dL (ref 100–199)
Chol/HDL Ratio: 2.9 ratio (ref 0.0–4.4)
HDL: 61 mg/dL (ref >39)
LDL CALC: 94 mg/dL (ref 0–99)
Triglycerides: 114 mg/dL (ref 0–149)
VLDL CHOLESTEROL CAL: 23 mg/dL (ref 5–40)

## 2018-01-28 NOTE — Patient Instructions (Addendum)
Continue with the dietary changes you have made. Consider checking your blood sugar 2 hours after some meals a couple times per week for a few weeks. Then after that just when you are curious.  Continue watching the sodium in foods to help your blood pressure Continue to think of ways to remind you to take your medications Stress management is also important in blood sugar control.

## 2018-01-28 NOTE — Progress Notes (Signed)
Diabetes Self-Management Education  Visit Type: Follow-up  Appt. Start Time: 0900 Appt. End Time: 0945  01/28/2018  [name], identified by name and date of birth, is a 77 y.o. female with a diagnosis of Diabetes: Type 2.   ASSESSMENT Pt states she made several changes since her last visit including paying close attention to carb intake by cutting out regular soda, eating less potatoes, as well as cutting down on fatty meats. Pt states she is also drinking more water. Pt has been checking her FBG, 14 day avg is 118 mg/dL with only small differences day to day. Patient remains highly motivated to control her T2DM without medications. Pt reports concerns for family members and friends who do not appear to be taking good care of their DM.  Pt states her doctor took her off statin medication due to potential side effect of cramps in her legs and if her symptoms improve off the medication he will prescribe a different medication.  When RD asked about exercise, paitnt states she is very active taking care of her husbands needs due to being on dialysis and has some very physically demanding tasks related to his care. Pt states she is so busy taking care of his needs she often forgets to take care of her own needs including taking her medications. Pt states she has friends who are in similar situations and they give social support for each other. Pt states she does not want to pursue other resources to help her take care of her husband at this time.  Pt states sometimes she doesn't get a lot of sleep because she stays up too late because she likes to have personal time for watching TV or other relaxing activities after her husband's needs are met.  Diabetes Self-Management Education - 01/28/18 0900      Visit Information   Visit Type  Follow-up      Initial Visit   Diabetes Type  Type 2      Psychosocial Assessment   Patient Belief/Attitude about Diabetes  Motivated to manage diabetes       Complications   How often do you check your blood sugar?  1-2 times/day    Fasting Blood glucose range (mg/dL)  70-129    Number of hypoglycemic episodes per month  0    Number of hyperglycemic episodes per week  0      Dietary Intake   Breakfast  eggs and a little sausage or bacon, grits, toast, gravey    Lunch  hamburger    Dinner   [place] dressing, gravy, potatoes, green beans     Snack (evening)  popcicle    Beverage(s)  water, diet coke, unsweet tea      Exercise   Exercise Type  ADL's    How many days per week to you exercise?  0    How many minutes per day do you exercise?  0    Total minutes per week of exercise  0      Patient Education   Nutrition management   Food label reading, portion sizes and measuring food.    Physical activity and exercise   Role of exercise on diabetes management, blood pressure control and cardiac health.    Monitoring  Purpose and frequency of SMBG.      Individualized Goals (developed by patient)   Nutrition  General guidelines for healthy choices and portions discussed    Medications  take my medication as prescribed  Monitoring   test my blood glucose as discussed      Patient Self-Evaluation of Goals - Patient rates self as meeting previously set goals (% of time)   Nutrition  >75%    Monitoring  >75%      Outcomes   Expected Outcomes  Demonstrated interest in learning. Expect positive outcomes    Future DMSE  2 months    Program Status  Completed      Subsequent Visit   Since your last visit have you continued or begun to take your medications as prescribed?  Yes    Since your last visit have you had your blood pressure checked?  Yes    Is your most recent blood pressure lower, unchanged, or higher since your last visit?  Higher    Since your last visit have you experienced any weight changes?  Loss    Weight Loss (lbs)  2    Since your last visit, are you checking your blood glucose at least once a day?  Yes       Individualized Plan for Diabetes Self-Management Training:   Learning Objective:  Patient will have a greater understanding of diabetes self-management. Patient education plan is to attend individual and/or group sessions per assessed needs and concerns.   Patient Instructions  Continue with the dietary changes you have made. Consider checking your blood sugar 2 hours after some meals a couple times per week for a few weeks. Then after that just when you are curious.  Continue watching the sodium in foods to help your blood pressure Continue to think of ways to remind you to take your medications Stress management is also important in blood sugar control.  Expected Outcomes:  Demonstrated interest in learning. Expect positive outcomes  Education material provided: none  If problems or questions, patient to contact team via:  Phone and MyChart  Future DSME appointment: 2 months

## 2018-01-29 ENCOUNTER — Other Ambulatory Visit: Payer: Medicare Other

## 2018-02-05 ENCOUNTER — Other Ambulatory Visit: Payer: Self-pay | Admitting: Internal Medicine

## 2018-02-11 ENCOUNTER — Ambulatory Visit: Payer: Medicare Other | Admitting: Internal Medicine

## 2018-02-15 DIAGNOSIS — E538 Deficiency of other specified B group vitamins: Secondary | ICD-10-CM | POA: Diagnosis not present

## 2018-03-22 DIAGNOSIS — E113291 Type 2 diabetes mellitus with mild nonproliferative diabetic retinopathy without macular edema, right eye: Secondary | ICD-10-CM | POA: Diagnosis not present

## 2018-03-22 DIAGNOSIS — H2513 Age-related nuclear cataract, bilateral: Secondary | ICD-10-CM | POA: Diagnosis not present

## 2018-03-29 ENCOUNTER — Ambulatory Visit: Payer: Medicare Other | Admitting: Adult Health

## 2018-03-29 DIAGNOSIS — E119 Type 2 diabetes mellitus without complications: Secondary | ICD-10-CM | POA: Diagnosis not present

## 2018-03-29 DIAGNOSIS — I10 Essential (primary) hypertension: Secondary | ICD-10-CM | POA: Diagnosis not present

## 2018-03-29 DIAGNOSIS — Z6838 Body mass index (BMI) 38.0-38.9, adult: Secondary | ICD-10-CM | POA: Diagnosis not present

## 2018-03-29 DIAGNOSIS — E538 Deficiency of other specified B group vitamins: Secondary | ICD-10-CM | POA: Diagnosis not present

## 2018-03-29 DIAGNOSIS — E1169 Type 2 diabetes mellitus with other specified complication: Secondary | ICD-10-CM | POA: Diagnosis not present

## 2018-03-30 DIAGNOSIS — E538 Deficiency of other specified B group vitamins: Secondary | ICD-10-CM | POA: Diagnosis not present

## 2018-03-30 DIAGNOSIS — E1169 Type 2 diabetes mellitus with other specified complication: Secondary | ICD-10-CM | POA: Diagnosis not present

## 2018-03-30 DIAGNOSIS — I10 Essential (primary) hypertension: Secondary | ICD-10-CM | POA: Diagnosis not present

## 2018-03-30 DIAGNOSIS — Z6838 Body mass index (BMI) 38.0-38.9, adult: Secondary | ICD-10-CM | POA: Diagnosis not present

## 2018-03-31 ENCOUNTER — Other Ambulatory Visit: Payer: Self-pay | Admitting: Internal Medicine

## 2018-03-31 MED ORDER — LISINOPRIL 10 MG PO TABS: 10.0000 mg | ORAL_TABLET | Freq: Every day | ORAL | 3 refills | Status: DC

## 2018-04-13 DIAGNOSIS — M25551 Pain in right hip: Secondary | ICD-10-CM | POA: Diagnosis not present

## 2018-04-26 NOTE — Progress Notes (Signed)
PATIENT: [name] DOB: Jul 14, 1940  REASON FOR VISIT: follow up HISTORY FROM: patient  HISTORY OF PRESENT ILLNESS: Today 04/27/18  [name] is a 78 year old female with a history of migraine headaches.  She is on a beta-blocker and she uses Imitrex on an as-needed basis.  She is doing really well with her headaches and she reports in the last year she has only had 2 headaches.  She reports that with both of these headaches she had an aura beforehand with visual features.  She was able to take Imitrex early on and prevent actual head pain.  She reports that she will take a Phenergan with the Imitrex and that provides good benefit for her.  She reports next week she will be having cataract surgery on her right eye.  She reports that she was recently diagnosed with diabetes 6 months ago and is managing it with lifestyle changes.  She is very happy with her current headache control.  She presents today for follow-up.  HISTORY  [name] is a 78 year old right-handed white female with a history of migraine headaches.  She has done very well since last seen, she has only had one headache in the summer 2018 since she has been seen here.  The patient has not used much of her Imitrex or Phenergan for the headaches.  She is not on any preventative medications for her headache.  She is pleased with her headache control.  She reports no other new medical issues with exception that she had an esophageal stricture dilated recently.  She returns to the office today for evaluation.   REVIEW OF SYSTEMS: Out of a complete 14 system review of symptoms, the patient complains only of the following symptoms, and all other reviewed systems are negative.  Blurred vision, leg swelling, joint pain, joint swelling, back pain, leg cramps  ALLERGIES: Allergies  Allergen Reactions  . Reglan [Metoclopramide] Other (See Comments)    Speech is garbled, extreme trouble making words    HOME  MEDICATIONS: Outpatient Medications Prior to Visit  Medication Sig Dispense Refill  . acetaminophen (TYLENOL) 500 MG tablet Take 500 mg by mouth daily as needed for moderate pain.    [name] albuterol (PROVENTIL HFA;VENTOLIN HFA) 108 (90 Base) MCG/ACT inhaler Inhale 2 puffs into the lungs every 6 (six) hours as needed for wheezing or shortness of breath.    [name] aspirin EC 81 MG tablet Take 81 mg by mouth 2 (two) times a week.     . calcium carbonate (TUMS EX) 750 MG chewable tablet Chew 2 tablets by mouth daily as needed for heartburn.     . Cyanocobalamin (VITAMIN B-12 IJ) Inject as directed every 30 (thirty) days.    . Dextran 70-Hypromellose ([employer] NATURAL TEARS OP) Apply 1-2 drops to eye daily as needed (dry eyes).    . fluticasone (FLONASE) 50 MCG/ACT nasal spray Place 1 spray into both nostrils daily as needed for allergies.  1  . hydrochlorothiazide (HYDRODIURIL) 25 MG tablet TAKE 1 TABLET BY MOUTH DAILY 90 tablet 0  . lisinopril (PRINIVIL,ZESTRIL) 10 MG tablet Take 1 tablet (10 mg total) by mouth daily. 90 tablet 3  . loratadine (CLARITIN) 10 MG tablet Take 10 mg by mouth daily as needed for allergies.    . metoprolol tartrate (LOPRESSOR) 50 MG tablet Take 0.5 tablets (25 mg total) by mouth 2 (two) times daily. Please make yearly appt with Dr.End for July before anymore refills.1st attempt 90 tablet 0  . neomycin-bacitracin-polymyxin (  NEOSPORIN) ointment Apply 1 application topically as needed for wound care.    . pantoprazole (PROTONIX) 40 MG tablet TAKE 1 TABLET(40 MG) BY MOUTH TWICE DAILY BEFORE A MEAL 180 tablet 0  . polyethylene glycol (MIRALAX / GLYCOLAX) packet Take 17 g by mouth daily as needed for mild constipation.    . pravastatin (PRAVACHOL) 20 MG tablet TAKE ONE (1) TABLET (20 MG) BY MOUTH EACH EVENING. 90 tablet 1  . SUMAtriptan (IMITREX) 100 MG tablet Take 1 tablet at onset of migraine. Can take 1 additional tablet in 2 hours once if needed. Do not take more than 2 tablets in 24 hours.  10 tablet 3  . promethazine (PHENERGAN) 25 MG tablet Take 1 tablet (25 mg total) by mouth every 6 (six) hours as needed for nausea or vomiting. 30 tablet 2  . hydrochlorothiazide (HYDRODIURIL) 25 MG tablet TAKE 1 TABLET BY MOUTH DAILY 90 tablet 2   No facility-administered medications prior to visit.     PAST MEDICAL HISTORY: Past Medical History:  Diagnosis Date  . Allergy   . Arthritis    "got alot; especially in my spine"  . Chest pain 02/2016  . Chronic back pain    "all of it"  . Classical migraine with intractable migraine 02/25/2016  . Diverticulosis of colon (without mention of hemorrhage)   . DVT of leg (deep venous thrombosis) ([place]) 09/2011   LLE  . Dysrhythmia    notes in epic  . Esophageal reflux   . Family history of malignant neoplasm of gastrointestinal tract   . Fibromyalgia    "dx'd w/it when it first came out"  . Hiatal hernia   . Hyperlipidemia   . Hypertension   . Migraines 09/24/11   "often recently;  more controlled now"  . Obesity, unspecified   . Osteoporosis   . Palpitations    event monitor 8/13: NSR, Sinus brady, Sinus Tachy, PAC  . Personal history of colonic polyps 05/31/2007   adenomatous polyp  . Pneumonia    "3 times at least" (09/24/11)  . Rheumatoid arthritis(714.0)   . Scoliosis   . Stricture and stenosis of esophagus   . Stroke ([place]) 08/2016  . Unspecified gastritis and gastroduodenitis without mention of hemorrhage     PAST SURGICAL HISTORY: Past Surgical History:  Procedure Laterality Date  . BACK SURGERY    . BREAST CYST INCISION AND DRAINAGE  2012   left; "golf-ball sized"  . BREAST SURGERY     cyst lanced and drainded  . COLON SURGERY    . COLONOSCOPY  2009  . COLONOSCOPY WITH PROPOFOL N/A 03/16/2017   Procedure: COLONOSCOPY WITH PROPOFOL;  Surgeon: Gatha Mayer, MD;  Location: [facility];  Service: Endoscopy;  Laterality: N/A;  . ESOPHAGEAL DILATION     "several times"  . ESOPHAGOGASTRODUODENOSCOPY (EGD) WITH  PROPOFOL N/A 03/16/2017   Procedure: ESOPHAGOGASTRODUODENOSCOPY (EGD) WITH PROPOFOL;  Surgeon: Gatha Mayer, MD;  Location: [facility];  Service: Endoscopy;  Laterality: N/A;  . EXPLORATORY LAPAROTOMY  04/2000   "for ruptured colon"  . HEMATOMA EVACUATION  09/2011   LLE  . LUMBAR LAMINECTOMY/DECOMPRESSION MICRODISCECTOMY  09/03/2011   Procedure: LUMBAR LAMINECTOMY/DECOMPRESSION MICRODISCECTOMY 2 LEVELS;  Surgeon: Elaina Hoops, MD;  Location: [facility];  Service: Neurosurgery;  Laterality: Left;  Left Lumbar Three-Four, Lumbar Four-Five Decompressive Laminectomy  . SALIVARY GLAND SURGERY  ~ 1980   1 removed  . SHOULDER SURGERY  2008   right shoulder and collar bone  . SIGMOID  RESECTION / RECTOPEXY  09/1999   Dr Rosana Hoes  . TRIGGER FINGER RELEASE  2004   left thumb  . TUMOR REMOVAL  1960's   left leg  . UPPER GASTROINTESTINAL ENDOSCOPY      FAMILY HISTORY: Family History  Problem Relation Age of Onset  . Stroke Mother   . Hypertension Mother   . Stroke Father   . Hypertension Father   . Diabetes Father   . Pancreatic cancer Sister   . Diabetes Sister        x 2  . Colon cancer Sister 29  . Pancreatic cancer Sister   . Heart attack Neg Hx   . Stomach cancer Neg Hx     SOCIAL HISTORY: Social History   Socioeconomic History  . Marital status: Married    Spouse name: Not on file  . Number of children: 1  . 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 Highest education level: Not on file  Occupational History    Employer: RETIRED  Social Needs  . Financial resource strain: Not on file  . Food insecurity:    Worry: Not on file    Inability: Not on file  . Transportation needs:    Medical: Not on file    Non-medical: Not on file  Tobacco Use  . Smoking status: Never Smoker  . Smokeless tobacco: Never Used  Substance and Sexual Activity  . Alcohol use: No  . Drug use: No  . Sexual activity: Not Currently    Partners: Male    Birth control/protection:  Post-menopausal  Lifestyle  . Physical activity:    Days per week: Not on file    Minutes per session: Not on file  . Stress: Not on file  Relationships  . Social connections:    Talks on phone: Not on file    Gets together: Not on file    Attends religious service: Not on file    Active member of club or organization: Not on file    Attends meetings of clubs or organizations: Not on file    Relationship status: Not on file  . Intimate partner violence:    Fear of current or ex partner: Not on file    Emotionally abused: Not on file    Physically abused: Not on file    Forced sexual activity: Not on file  Other Topics Concern  . Not on file  Social History Narrative   Lives at home w/ her husband   Right-handed   Caffeine: usually drinks decaf and tea      PHYSICAL EXAM  Vitals:   04/27/18 1331  BP: (!) 154/73  Pulse: (!) 59  Weight: 188 lb 6.4 oz (85.5 kg)  Height: 5' (1.524 m)   Body mass index is 36.79 kg/m.  Generalized: Well developed, in no acute distress   Neurological examination  Mentation: Alert oriented to time, place, history taking. Follows all commands speech and language fluent Cranial nerve II-XII: Pupils were equal round reactive to light. Extraocular movements were full, visual field were full on confrontational test. Facial sensation and strength were normal. Uvula tongue midline. Head turning and shoulder shrug  were normal and symmetric. Motor: The motor testing reveals 5 over 5 strength of all 4 extremities. Good symmetric motor tone is noted throughout.  Sensory: Sensory testing is intact to soft touch on all 4 extremities. No evidence of extinction is noted.  Coordination: Cerebellar testing reveals good finger-nose-finger and heel-to-shin bilaterally.  Gait and station:  Gait is antalgic, right hip pain.  Tandem gait is unsteady. Romberg is negative. No drift is seen.  Reflexes: Deep tendon reflexes are symmetric and decreased bilaterally.    DIAGNOSTIC DATA (LABS, IMAGING, TESTING) - I reviewed patient records, labs, notes, testing and imaging myself where available.  Lab Results  Component Value Date   WBC 6.3 01/25/2018   HGB 15.0 01/25/2018   HCT 45.1 01/25/2018   MCV 85 01/25/2018   PLT 296 01/25/2018      Component Value Date/Time   NA 140 01/25/2018 1232   K 4.6 01/25/2018 1232   CL 98 01/25/2018 1232   CO2 20 01/25/2018 1232   GLUCOSE 89 01/25/2018 1232   GLUCOSE 160 (H) 08/20/2016 0342   BUN 12 01/25/2018 1232   CREATININE 0.89 01/25/2018 1232   CREATININE 0.93 01/21/2016 1237   CALCIUM 9.7 01/25/2018 1232   PROT 6.8 01/25/2018 1232   ALBUMIN 4.4 01/25/2018 1232   AST 24 01/25/2018 1232   ALT 11 01/25/2018 1232   ALKPHOS 61 01/25/2018 1232   BILITOT 0.8 01/25/2018 1232   GFRNONAA 63 01/25/2018 1232   GFRAA 73 01/25/2018 1232   Lab Results  Component Value Date   CHOL 178 01/28/2018   HDL 61 01/28/2018   LDLCALC 94 01/28/2018   TRIG 114 01/28/2018   CHOLHDL 2.9 01/28/2018   No results found for: HGBA1C Lab Results  Component Value Date   VITAMINB12 188 (L) 02/06/2011   Lab Results  Component Value Date   TSH 2.569 02/13/2016      ASSESSMENT AND PLAN 78 y.o. year old female  has a past medical history of Allergy, Arthritis, Chest pain (02/2016), Chronic back pain, Classical migraine with intractable migraine (02/25/2016), Diverticulosis of colon (without mention of hemorrhage), DVT of leg (deep venous thrombosis) ([place]) (09/2011), Dysrhythmia, Esophageal reflux, Family history of malignant neoplasm of gastrointestinal tract, Fibromyalgia, Hiatal hernia, Hyperlipidemia, Hypertension, Migraines (09/24/11), Obesity, unspecified, Osteoporosis, Palpitations, Personal history of colonic polyps (05/31/2007), Pneumonia, Rheumatoid arthritis(714.0), Scoliosis, Stricture and stenosis of esophagus, Stroke ([place]) (08/2016), and Unspecified gastritis and gastroduodenitis without mention of hemorrhage. here  with:  1.  Headache  Overall she is doing quite well with her headaches.  She has only had 2 headaches in the last year.  A prescription for her Phenergan was sent in today.  She will continue taking Imitrex on an as-needed basis.  She will follow-up in this office in 1 year or sooner if needed.   I spent 15 minutes with the patient. 50% of this time was spent discussing her plan of care.   Butler Denmark, AGNP-C, DNP 04/27/2018, 2:06 PM [facility] [address], [place], [place] [address] [phone]

## 2018-04-27 ENCOUNTER — Ambulatory Visit: Payer: Medicare Other | Admitting: Neurology

## 2018-04-27 ENCOUNTER — Encounter: Payer: Self-pay | Admitting: Neurology

## 2018-04-27 VITALS — BP 154/73 | HR 59 | Ht 60.0 in | Wt 188.4 lb

## 2018-04-27 DIAGNOSIS — R51 Headache: Secondary | ICD-10-CM

## 2018-04-27 DIAGNOSIS — R519 Headache, unspecified: Secondary | ICD-10-CM

## 2018-04-27 MED ORDER — PROMETHAZINE HCL 25 MG PO TABS: 25.0000 mg | ORAL_TABLET | Freq: Four times a day (QID) | ORAL | 2 refills | Status: DC | PRN

## 2018-04-27 NOTE — Progress Notes (Signed)
I have read the note, and I agree with the clinical assessment and plan.  Michayla Mcneil K Nezar Buckles   

## 2018-04-28 ENCOUNTER — Telehealth: Payer: Self-pay

## 2018-04-28 ENCOUNTER — Other Ambulatory Visit: Payer: Self-pay

## 2018-04-28 DIAGNOSIS — I10 Essential (primary) hypertension: Secondary | ICD-10-CM

## 2018-04-28 DIAGNOSIS — E785 Hyperlipidemia, unspecified: Secondary | ICD-10-CM

## 2018-04-28 NOTE — Telephone Encounter (Signed)
Called pt to schedule lab work (fasting lipids, lfts) per Richardson Dopp, PA-C to be done in a few weeks. Pt wasn't home and will try again later.

## 2018-04-29 ENCOUNTER — Ambulatory Visit: Payer: Medicare Other | Admitting: Registered"

## 2018-05-04 DIAGNOSIS — H2511 Age-related nuclear cataract, right eye: Secondary | ICD-10-CM | POA: Diagnosis not present

## 2018-05-04 DIAGNOSIS — H25811 Combined forms of age-related cataract, right eye: Secondary | ICD-10-CM | POA: Diagnosis not present

## 2018-05-05 ENCOUNTER — Other Ambulatory Visit: Payer: Self-pay | Admitting: Internal Medicine

## 2018-05-05 DIAGNOSIS — E7800 Pure hypercholesterolemia, unspecified: Secondary | ICD-10-CM

## 2018-05-05 NOTE — Telephone Encounter (Signed)
Refill Request.  

## 2018-05-12 ENCOUNTER — Other Ambulatory Visit: Payer: Medicare Other

## 2018-05-17 ENCOUNTER — Other Ambulatory Visit: Payer: Self-pay

## 2018-05-17 MED ORDER — METOPROLOL TARTRATE 50 MG PO TABS: 25.0000 mg | ORAL_TABLET | Freq: Two times a day (BID) | ORAL | 2 refills | Status: DC

## 2018-05-18 DIAGNOSIS — E538 Deficiency of other specified B group vitamins: Secondary | ICD-10-CM | POA: Diagnosis not present

## 2018-06-30 DIAGNOSIS — E538 Deficiency of other specified B group vitamins: Secondary | ICD-10-CM | POA: Diagnosis not present

## 2018-06-30 DIAGNOSIS — E119 Type 2 diabetes mellitus without complications: Secondary | ICD-10-CM | POA: Diagnosis not present

## 2018-07-30 DIAGNOSIS — E538 Deficiency of other specified B group vitamins: Secondary | ICD-10-CM | POA: Diagnosis not present

## 2018-08-31 DIAGNOSIS — E538 Deficiency of other specified B group vitamins: Secondary | ICD-10-CM | POA: Diagnosis not present

## 2018-09-24 ENCOUNTER — Telehealth: Payer: Self-pay

## 2018-09-24 NOTE — Telephone Encounter (Signed)
Called and left message for patient to call and schedule follow up visit.

## 2018-10-01 DIAGNOSIS — E119 Type 2 diabetes mellitus without complications: Secondary | ICD-10-CM | POA: Diagnosis not present

## 2018-10-07 DIAGNOSIS — E538 Deficiency of other specified B group vitamins: Secondary | ICD-10-CM | POA: Diagnosis not present

## 2018-10-19 NOTE — Progress Notes (Signed)
Cardiology Office Note:    Date:  10/20/2018   ID:  [name], DOB 1940/05/31, MRN [id number]  PCP:  Megan Lass, MD  Cardiologist:  Megan Bush, MD >> will change to Dr. Juliann Pulse, PA-C   Electrophysiologist:  None   Referring MD: Megan Lass, MD   Chief Complaint  Patient presents with  . Follow-up    HTN, HLP    History of Present Illness:    [name] is a 78 y.o. female with:  Hypertension  GERD  Autonomic insufficiency  Migraine headaches  History of prior DVT (after lumbar laminectomy 6/13)  Postop developed orthostatic hypotension; postural tachycardia  Palpitations  Hyperlipidemia   [name] was last seen by Dr. Saunders Walker in 01/2018.  She returns for follow up.  She is here alone.  She is doing well.  She has not had significant shortness of breath.  She has occasional indigestion but no exertional chest pain.  She has not had syncope.  She sleeps on an incline due to GERD.  She has leg swelling that is controlled with compression stockings.   Prior CV studies:   The following studies were reviewed today:  Echo 02/13/2016 Mild LVH, EF 50-55, normal wall motion  Echo (7/13):  Mild LVH, EF 65-70%  Nuclear (2/13):  Normal stress nuclear study. Mild anteroapical attenuation from breast shadow. EF 80%   Past Medical History:  Diagnosis Date  . Allergy   . Arthritis    "got alot; especially in my spine"  . Chest pain 02/2016  . Chronic back pain    "all of it"  . Classical migraine with intractable migraine 02/25/2016  . Diverticulosis of colon (without mention of hemorrhage)   . DVT of leg (deep venous thrombosis) ([place]) 09/2011   LLE  . Dysrhythmia    notes in epic  . Esophageal reflux   . Family history of malignant neoplasm of gastrointestinal tract   . Fibromyalgia    "dx'd w/it when it first came out"  . Hiatal hernia   . Hyperlipidemia   . Hypertension   . Migraines 09/24/11   "often recently;  more  controlled now"  . Obesity, unspecified   . Osteoporosis   . Palpitations    event monitor 8/13: NSR, Sinus brady, Sinus Tachy, PAC  . Personal history of colonic polyps 05/31/2007   adenomatous polyp  . Pneumonia    "3 times at least" (09/24/11)  . Rheumatoid arthritis(714.0)   . Scoliosis   . Stricture and stenosis of esophagus   . Stroke ([place]) 08/2016  . Unspecified gastritis and gastroduodenitis without mention of hemorrhage   1. GERD with esophageal stricture and hiatal hernia.  2. Low back pain: s/p lumbar laminectomy in 6/13.  3. Hyperlipidemia 4. HTN 5. Migraines.  6. Echo (7/13): EF 65-70%, mild LVH  7. Scoliosis 8. B12 deficiency 9. Osteoarthritis 10. Diverticulitis with surgery in 2001 11. Atypical chest pain: Lexiscan myoview (2/13) with EF 80%, breast attenuation, no evidence for ischemia or infarction.  12. Palpitations: Holter (2/13) with occasional PACs.  13. Carotid dopplers (7/13) with minimal disease.  14. Autonomic insufficiency: post-lumbar laminectomy in 6/13. Predominantly manifested as postural tachycardia.  15. DVT 6/13 post-surgery.    Surgical Hx: The patient  has a past surgical history that includes Sigmoid resection / rectopexy (09/1999); Salivary gland surgery (~ 1980); Shoulder surgery (2008); Colonoscopy (2009); Upper gastrointestinal endoscopy; Breast surgery; Tumor removal (1960's); Lumbar laminectomy/decompression microdiscectomy (09/03/2011); Exploratory laparotomy (04/2000); Esophageal dilation; Trigger  finger release (2004); Breast cyst incision and drainage (2012); Back surgery; Hematoma evacuation (09/2011); Colon surgery; Colonoscopy with propofol (N/A, 03/16/2017); and Esophagogastroduodenoscopy (egd) with propofol (N/A, 03/16/2017).   Current Medications: Current Meds  Medication Sig  . acetaminophen (TYLENOL) 500 MG tablet Take 500 mg by mouth daily as needed for moderate pain.  [name] albuterol (PROVENTIL HFA;VENTOLIN HFA) 108 (90 Base)  MCG/ACT inhaler Inhale 2 puffs into the lungs every 6 (six) hours as needed for wheezing or shortness of breath.  [name] aspirin EC 81 MG tablet Take 81 mg by mouth 2 (two) times a week.   . calcium carbonate (TUMS EX) 750 MG chewable tablet Chew 2 tablets by mouth daily as needed for heartburn.   . Cyanocobalamin (VITAMIN B-12 IJ) Inject as directed every 30 (thirty) days.  . Dextran 70-Hypromellose ([employer] NATURAL TEARS OP) Apply 1-2 drops to eye daily as needed (dry eyes).  . fluticasone (FLONASE) 50 MCG/ACT nasal spray Place 1 spray into both nostrils daily as needed for allergies.  . hydrochlorothiazide (HYDRODIURIL) 25 MG tablet TAKE 1 TABLET BY MOUTH DAILY  . lisinopril (PRINIVIL,ZESTRIL) 10 MG tablet Take 1 tablet (10 mg total) by mouth daily.  [name] loratadine (CLARITIN) 10 MG tablet Take 10 mg by mouth daily as needed for allergies.  . metoprolol tartrate (LOPRESSOR) 50 MG tablet Take 0.5 tablets (25 mg total) by mouth 2 (two) times daily.  [name] neomycin-bacitracin-polymyxin (NEOSPORIN) ointment Apply 1 application topically as needed for wound care.  . pantoprazole (PROTONIX) 40 MG tablet TAKE 1 TABLET(40 MG) BY MOUTH TWICE DAILY BEFORE A MEAL  . polyethylene glycol (MIRALAX / GLYCOLAX) packet Take 17 g by mouth daily as needed for mild constipation.  . pravastatin (PRAVACHOL) 20 MG tablet TAKE ONE (1) TABLET (20 MG) BY MOUTH EACH EVENING.  . promethazine (PHENERGAN) 25 MG tablet Take 1 tablet (25 mg total) by mouth every 6 (six) hours as needed for nausea or vomiting.  . SUMAtriptan (IMITREX) 100 MG tablet Take 1 tablet at onset of migraine. Can take 1 additional tablet in 2 hours once if needed. Do not take more than 2 tablets in 24 hours.     Allergies:   Reglan [metoclopramide]   Social History   Tobacco Use  . Smoking status: Never Smoker  . Smokeless tobacco: Never Used  Substance Use Topics  . Alcohol use: No  . Drug use: No     Family Hx: The patient's family history includes Colon  cancer (age of onset: 77) in her sister; Diabetes in her father and sister; Hypertension in her father and mother; Pancreatic cancer in her sister and sister; Stroke in her father and mother. There is no history of Heart attack or Stomach cancer.  ROS:   Please see the history of present illness.    ROS All other systems reviewed and are negative.   EKGs/Labs/Other Test Reviewed:    EKG:  EKG is  ordered today.  The ekg ordered today demonstrates sinus bradycardia, heart rate 59, normal axis, QTC 397, similar to prior tracing  Recent Labs: 01/25/2018: ALT 11; BUN 12; Creatinine, Ser 0.89; Hemoglobin 15.0; Platelets 296; Potassium 4.6; Sodium 140   Recent Lipid Panel Lab Results  Component Value Date/Time   CHOL 178 01/28/2018 10:37 AM   TRIG 114 01/28/2018 10:37 AM   HDL 61 01/28/2018 10:37 AM   CHOLHDL 2.9 01/28/2018 10:37 AM   CHOLHDL 4.5 06/06/2007 12:40 AM   LDLCALC 94 01/28/2018 10:37 AM    Physical Exam:  VS:  BP (!) 164/72   Walker (!) 59   Ht '5\' 1"'  (1.549 m)   Wt 190 lb (86.2 kg)   LMP  (LMP Unknown)   BMI 35.90 kg/m     Wt Readings from Last 3 Encounters:  10/20/18 190 lb (86.2 kg)  04/27/18 188 lb 6.4 oz (85.5 kg)  01/25/18 190 lb 6.4 oz (86.4 kg)     Physical Exam  Constitutional: She is oriented to person, place, and time. She appears well-developed and well-nourished. No distress.  HENT:  Head: Normocephalic and atraumatic.  Neck: Neck supple. No JVD present.  Cardiovascular: Normal rate, regular rhythm, S1 normal, S2 normal and normal heart sounds.  No murmur heard. Pulmonary/Chest: Effort normal. She has no rales.  Abdominal: Soft. There is no hepatomegaly.  Musculoskeletal:        General: Edema (trace-1+ bilat LE edema; L>R) present.  Neurological: She is alert and oriented to person, place, and time.  Skin: Skin is warm and dry.    ASSESSMENT & PLAN:    1. Essential hypertension Blood pressure elevated.  She notes better blood pressure  readings at home.  Continue current dose of lisinopril, HCTZ.  I have asked her to check her blood pressure couple times a day for the next couple of weeks and send me those readings for review.  2. Hyperlipidemia, unspecified hyperlipidemia type She stopped taking pravastatin due to leg cramps.  Her leg cramps resolved after this.  Therefore, she will remain off of statin therapy due to myalgias.  She does have diabetes.  Obtain follow-up CMET, lipid panel today.  Consider adding Zetia to her medical regimen.  3. Dyspnea on exertion Breathing is improved.   Dispo:  Return in about 6 months (around 04/22/2019) for Routine Follow Up, w/ Richardson Dopp, PA-C. Given Dr. Darnelle Bos move to [facility], I will follow her here along with Dr. Burt Knack.    Medication Adjustments/Labs and Tests Ordered: Current medicines are reviewed at length with the patient today.  Concerns regarding medicines are outlined above.  Tests Ordered: Orders Placed This Encounter  Procedures  . Lipid Profile  . Comp Met (CMET)  . EKG 12-Lead   Medication Changes: No orders of the defined types were placed in this encounter.   Signed, Richardson Dopp, PA-C  10/20/2018 5:19 PM    [facility] [address], [place], [place]  [address] Phone: [phone]; Fax: [phone]

## 2018-10-20 ENCOUNTER — Other Ambulatory Visit: Payer: Self-pay

## 2018-10-20 ENCOUNTER — Encounter: Payer: Self-pay | Admitting: Physician Assistant

## 2018-10-20 ENCOUNTER — Ambulatory Visit (INDEPENDENT_AMBULATORY_CARE_PROVIDER_SITE_OTHER): Payer: Medicare Other | Admitting: Physician Assistant

## 2018-10-20 VITALS — BP 164/72 | HR 59 | Ht 61.0 in | Wt 190.0 lb

## 2018-10-20 DIAGNOSIS — R0609 Other forms of dyspnea: Secondary | ICD-10-CM | POA: Diagnosis not present

## 2018-10-20 DIAGNOSIS — I10 Essential (primary) hypertension: Secondary | ICD-10-CM | POA: Diagnosis not present

## 2018-10-20 DIAGNOSIS — R0600 Dyspnea, unspecified: Secondary | ICD-10-CM

## 2018-10-20 DIAGNOSIS — E785 Hyperlipidemia, unspecified: Secondary | ICD-10-CM

## 2018-10-20 LAB — COMPREHENSIVE METABOLIC PANEL
ALT: 9 IU/L (ref 0–32)
AST: 17 IU/L (ref 0–40)
Albumin/Globulin Ratio: 2.1 (ref 1.2–2.2)
Albumin: 4.4 g/dL (ref 3.7–4.7)
Alkaline Phosphatase: 57 IU/L (ref 39–117)
BUN/Creatinine Ratio: 19 (ref 12–28)
BUN: 16 mg/dL (ref 8–27)
Bilirubin Total: 0.9 mg/dL (ref 0.0–1.2)
CO2: 24 mmol/L (ref 20–29)
Calcium: 9.3 mg/dL (ref 8.7–10.3)
Chloride: 102 mmol/L (ref 96–106)
Creatinine, Ser: 0.85 mg/dL (ref 0.57–1.00)
GFR calc Af Amer: 76 mL/min/{1.73_m2} (ref >59)
GFR calc non Af Amer: 66 mL/min/{1.73_m2} (ref >59)
Globulin, Total: 2.1 g/dL (ref 1.5–4.5)
Glucose: 99 mg/dL (ref 65–99)
Potassium: 5.1 mmol/L (ref 3.5–5.2)
Sodium: 140 mmol/L (ref 134–144)
Total Protein: 6.5 g/dL (ref 6.0–8.5)

## 2018-10-20 LAB — LIPID PANEL
Chol/HDL Ratio: 3.6 ratio (ref 0.0–4.4)
Cholesterol, Total: 230 mg/dL — ABNORMAL HIGH (ref 100–199)
HDL: 64 mg/dL (ref >39)
LDL Calculated: 138 mg/dL — ABNORMAL HIGH (ref 0–99)
Triglycerides: 138 mg/dL (ref 0–149)
VLDL Cholesterol Cal: 28 mg/dL (ref 5–40)

## 2018-10-20 NOTE — Patient Instructions (Signed)
Medication Instructions:  none If you need a refill on your cardiac medications before your next appointment, please call your pharmacy.   Lab work:TODAY CMET FLP If you have labs (blood work) drawn today and your tests are completely normal, you will receive your results only by: [name] MyChart Message (if you have MyChart) OR . A paper copy in the mail If you have any lab test that is abnormal or we need to change your treatment, we will call you to review the results.  Testing/Procedures: NONE  Follow-Up:  6 MONTHS with [facility], you and your health needs are our priority.  As part of our continuing mission to provide you with exceptional heart care, we have created designated Provider Care Teams.  These Care Teams include your primary Cardiologist (physician) and Advanced Practice Providers (APPs -  Physician Assistants and Nurse Practitioners) who all work together to provide you with the care you need, when you need it. .   Any Other Special Instructions Will Be Listed Below (If Applicable). Check BP 1-2 TIMES PER DAY FOR 2 weeks AND SEND OR CALL IN READINGS  [phone]

## 2018-10-21 ENCOUNTER — Telehealth: Payer: Self-pay | Admitting: *Deleted

## 2018-10-21 DIAGNOSIS — E785 Hyperlipidemia, unspecified: Secondary | ICD-10-CM

## 2018-10-21 MED ORDER — EZETIMIBE 10 MG PO TABS: 10.0000 mg | ORAL_TABLET | Freq: Every day | ORAL | 3 refills | Status: DC

## 2018-10-21 NOTE — Telephone Encounter (Signed)
-----   Message from Liliane Shi, PA-C sent at 10/21/2018  9:48 AM EDT ----- Renal function, LFTs, K+ normal.  Cholesterol too high.  LDL should at least be under 100 with dx of diabetes, HTN. She stopped Pravastatin due to myalgias.  PLAN:   -Start Zetia 10 mg once daily   -Arrange fasting Lipids and LFTs in 3 mos Richardson Dopp, PA-C    10/21/2018 9:45 AM

## 2018-11-05 DIAGNOSIS — E538 Deficiency of other specified B group vitamins: Secondary | ICD-10-CM | POA: Diagnosis not present

## 2018-11-24 DIAGNOSIS — D1801 Hemangioma of skin and subcutaneous tissue: Secondary | ICD-10-CM | POA: Diagnosis not present

## 2018-11-24 DIAGNOSIS — L821 Other seborrheic keratosis: Secondary | ICD-10-CM | POA: Diagnosis not present

## 2018-11-24 DIAGNOSIS — D2221 Melanocytic nevi of right ear and external auricular canal: Secondary | ICD-10-CM | POA: Diagnosis not present

## 2018-11-24 DIAGNOSIS — D229 Melanocytic nevi, unspecified: Secondary | ICD-10-CM | POA: Diagnosis not present

## 2018-11-24 DIAGNOSIS — L814 Other melanin hyperpigmentation: Secondary | ICD-10-CM | POA: Diagnosis not present

## 2018-11-25 ENCOUNTER — Other Ambulatory Visit: Payer: Self-pay | Admitting: Physician Assistant

## 2018-11-25 MED ORDER — HYDROCHLOROTHIAZIDE 25 MG PO TABS: 25.0000 mg | ORAL_TABLET | Freq: Every day | ORAL | 3 refills | Status: DC

## 2018-11-26 DIAGNOSIS — M8580 Other specified disorders of bone density and structure, unspecified site: Secondary | ICD-10-CM | POA: Diagnosis not present

## 2018-11-26 DIAGNOSIS — I10 Essential (primary) hypertension: Secondary | ICD-10-CM | POA: Diagnosis not present

## 2018-11-26 DIAGNOSIS — Z0000 Encounter for general adult medical examination without abnormal findings: Secondary | ICD-10-CM | POA: Diagnosis not present

## 2018-11-26 DIAGNOSIS — Z6841 Body Mass Index (BMI) 40.0 and over, adult: Secondary | ICD-10-CM | POA: Diagnosis not present

## 2018-12-06 DIAGNOSIS — Z23 Encounter for immunization: Secondary | ICD-10-CM | POA: Diagnosis not present

## 2018-12-06 DIAGNOSIS — E538 Deficiency of other specified B group vitamins: Secondary | ICD-10-CM | POA: Diagnosis not present

## 2018-12-27 DIAGNOSIS — E538 Deficiency of other specified B group vitamins: Secondary | ICD-10-CM | POA: Diagnosis not present

## 2018-12-29 ENCOUNTER — Other Ambulatory Visit: Payer: Self-pay

## 2018-12-29 ENCOUNTER — Telehealth: Payer: Self-pay | Admitting: Cardiovascular Disease

## 2018-12-29 DIAGNOSIS — E785 Hyperlipidemia, unspecified: Secondary | ICD-10-CM

## 2018-12-29 NOTE — Telephone Encounter (Signed)
I spoke to the patient who called to see what her most recent Lipid values were.  She had labs drawn in August and was to stop Pravastatin and start Zetia 10 mg Daily.    She is returning for labs on 11/13.  She informed me that she had not started Zetia, because of research that she has looked into, "possibly causing memory loss."  She has been working on diet and wants to see what her labs show before taking any cholesterol medication.

## 2018-12-29 NOTE — Telephone Encounter (Signed)
Continue dietary modifications. Consider plant based diet. Repeat fasting Lipids in 3 mos. Richardson Dopp, PA-C    12/29/2018 1:53 PM

## 2018-12-29 NOTE — Telephone Encounter (Signed)
I spoke to the patient and gave her [name]'s recommendation.  She verbalized understanding.

## 2018-12-29 NOTE — Telephone Encounter (Signed)
New message   Patient wants to know what her last cholesterol numbers were from lab work. Please call.

## 2019-01-21 ENCOUNTER — Other Ambulatory Visit: Payer: Medicare Other

## 2019-01-26 DIAGNOSIS — H2512 Age-related nuclear cataract, left eye: Secondary | ICD-10-CM | POA: Diagnosis not present

## 2019-01-27 DIAGNOSIS — E538 Deficiency of other specified B group vitamins: Secondary | ICD-10-CM | POA: Diagnosis not present

## 2019-01-30 ENCOUNTER — Other Ambulatory Visit: Payer: Self-pay | Admitting: Internal Medicine

## 2019-02-17 DIAGNOSIS — E119 Type 2 diabetes mellitus without complications: Secondary | ICD-10-CM | POA: Diagnosis not present

## 2019-02-28 ENCOUNTER — Other Ambulatory Visit: Payer: Self-pay | Admitting: *Deleted

## 2019-02-28 DIAGNOSIS — E538 Deficiency of other specified B group vitamins: Secondary | ICD-10-CM | POA: Diagnosis not present

## 2019-02-28 MED ORDER — METOPROLOL TARTRATE 50 MG PO TABS: 25.0000 mg | ORAL_TABLET | Freq: Two times a day (BID) | ORAL | 1 refills | Status: DC

## 2019-03-29 ENCOUNTER — Ambulatory Visit: Payer: Medicare Other

## 2019-04-01 ENCOUNTER — Other Ambulatory Visit: Payer: Medicare Other

## 2019-04-04 DIAGNOSIS — E538 Deficiency of other specified B group vitamins: Secondary | ICD-10-CM | POA: Diagnosis not present

## 2019-04-05 ENCOUNTER — Ambulatory Visit: Payer: Medicare Other

## 2019-04-08 ENCOUNTER — Other Ambulatory Visit: Payer: Self-pay | Admitting: Physician Assistant

## 2019-04-08 ENCOUNTER — Other Ambulatory Visit: Payer: Medicare Other | Admitting: *Deleted

## 2019-04-08 ENCOUNTER — Other Ambulatory Visit: Payer: Self-pay

## 2019-04-08 DIAGNOSIS — E785 Hyperlipidemia, unspecified: Secondary | ICD-10-CM

## 2019-04-09 LAB — LIPID PANEL
Chol/HDL Ratio: 3.6 ratio (ref 0.0–4.4)
Cholesterol, Total: 215 mg/dL — ABNORMAL HIGH (ref 100–199)
HDL: 60 mg/dL (ref >39)
LDL Chol Calc (NIH): 133 mg/dL — ABNORMAL HIGH (ref 0–99)
Triglycerides: 122 mg/dL (ref 0–149)
VLDL Cholesterol Cal: 22 mg/dL (ref 5–40)

## 2019-04-12 ENCOUNTER — Telehealth: Payer: Self-pay

## 2019-04-12 ENCOUNTER — Telehealth: Payer: Self-pay | Admitting: Physician Assistant

## 2019-04-12 MED ORDER — ROSUVASTATIN CALCIUM 5 MG PO TABS: 5.0000 mg | ORAL_TABLET | ORAL | 1 refills | Status: DC

## 2019-04-12 NOTE — Telephone Encounter (Signed)
New message   Patient has additional questions about cholesterol test results.  Please call.

## 2019-04-12 NOTE — Telephone Encounter (Signed)
The patient has been notified of the result and verbalized understanding.  All questions (if any) were answered. Patient is ok with trying Crestor 5 mg, 1 tablet 3 times a week. Prescription sent to patients pharmacy. Mady Haagensen, [place] 04/12/2019 10:22 AM

## 2019-04-12 NOTE — Telephone Encounter (Signed)
-----   Message from Liliane Shi, [place] sent at 04/10/2019  8:12 PM EST ----- Cholesterol numbers are still too high.   Rosuvastatin (Crestor) at a very low dose just 3 days a week may be better tolerated.  PLAN:   - Ask patient if she would be will to try Crestor 5 mg 3 days a week. Richardson Dopp, PA-C    04/10/2019 8:03 PM

## 2019-04-12 NOTE — Telephone Encounter (Signed)
I returned call to patient, she wanted her total cholesterol, good and bad cholesterol numbers. I gave patient those readings and she thanked me for the call.

## 2019-04-14 ENCOUNTER — Ambulatory Visit: Payer: Medicare Other

## 2019-04-24 NOTE — Progress Notes (Deleted)
Cardiology Office Note:    Date:  04/24/2019   ID:  [name], DOB 1940-08-06, MRN [id number]  PCP:  Kathyrn Lass, MD  Cardiologist:  Sherren Mocha, MD / Richardson Dopp, PA-C  Electrophysiologist:  None   Referring MD: Kathyrn Lass, MD   Chief Complaint:  No chief complaint on file.    Patient Profile:    [name] is a 79 y.o. female with:  ***  Hypertension  GERD  Autonomic insufficiency  Migraine headaches  History of prior DVT (after lumbar laminectomy 6/13) ? Postop developed orthostatic hypotension; postural tachycardia  Palpitations  Hyperlipidemia   Prior CV studies: *** Echo 02/13/2016 Mild LVH, EF 50-55, normal wall motion  Echo (7/13):  Mild LVH, EF 65-70%  Nuclear (2/13):  Normal stress nuclear study. Mild anteroapical attenuation from breast shadow. EF 80%  History of Present Illness:    Ms. Napoleone ***  [facility] SmartLink is not supported in this context. ***   Past Medical History:  Diagnosis Date  . Allergy   . Arthritis    "got alot; especially in my spine"  . Chest pain 02/2016  . Chronic back pain    "all of it"  . Classical migraine with intractable migraine 02/25/2016  . Diverticulosis of colon (without mention of hemorrhage)   . DVT of leg (deep venous thrombosis) ([place]) 09/2011   LLE  . Dysrhythmia    notes in epic  . Esophageal reflux   . Family history of malignant neoplasm of gastrointestinal tract   . Fibromyalgia    "dx'd w/it when it first came out"  . Hiatal hernia   . Hyperlipidemia   . Hypertension   . Migraines 09/24/11   "often recently;  more controlled now"  . Obesity, unspecified   . Osteoporosis   . Palpitations    event monitor 8/13: NSR, Sinus brady, Sinus Tachy, PAC  . Personal history of colonic polyps 05/31/2007   adenomatous polyp  . Pneumonia    "3 times at least" (09/24/11)  . Rheumatoid arthritis(714.0)   . Scoliosis   . Stricture and stenosis of esophagus   . Stroke  ([place]) 08/2016  . Unspecified gastritis and gastroduodenitis without mention of hemorrhage     Current Medications: No outpatient medications have been marked as taking for the 04/25/19 encounter (Appointment) with Richardson Dopp T, PA-C.     Allergies:   Reglan [metoclopramide]   Social History   Tobacco Use  . Smoking status: Never Smoker  . Smokeless tobacco: Never Used  Substance Use Topics  . Alcohol use: No  . Drug use: No     Family Hx: The patient's family history includes Colon cancer (age of onset: 36) in her sister; Diabetes in her father and sister; Hypertension in her father and mother; Pancreatic cancer in her sister and sister; Stroke in her father and mother. There is no history of Heart attack or Stomach cancer.  ROS   EKGs/Labs/Other Test Reviewed:    EKG:  EKG is *** ordered today.  The ekg ordered today demonstrates ***  Recent Labs: 10/20/2018: ALT 9; BUN 16; Creatinine, Ser 0.85; Potassium 5.1; Sodium 140   Recent Lipid Panel Lab Results  Component Value Date/Time   CHOL 215 (H) 04/08/2019 12:21 PM   TRIG 122 04/08/2019 12:21 PM   HDL 60 04/08/2019 12:21 PM   CHOLHDL 3.6 04/08/2019 12:21 PM   CHOLHDL 4.5 06/06/2007 12:40 AM   LDLCALC 133 (H) 04/08/2019 12:21 PM  Physical Exam:    VS:  LMP  (LMP Unknown)     Wt Readings from Last 3 Encounters:  10/20/18 190 lb (86.2 kg)  04/27/18 188 lb 6.4 oz (85.5 kg)  01/25/18 190 lb 6.4 oz (86.4 kg)     Physical Exam ***  ASSESSMENT & PLAN:    *** 1. Essential hypertension Blood pressure elevated.  She notes better blood pressure readings at home.  Continue current dose of lisinopril, HCTZ.  I have asked her to check her blood pressure couple times a day for the next couple of weeks and send me those readings for review.  2. Hyperlipidemia, unspecified hyperlipidemia type She stopped taking pravastatin due to leg cramps.  Her leg cramps resolved after this.  Therefore, she will remain off of statin  therapy due to myalgias.  She does have diabetes.  Obtain follow-up CMET, lipid panel today.  Consider adding Zetia to her medical regimen.  3. Dyspnea on exertion Breathing is improved.   Dispo:  No follow-ups on file.   Medication Adjustments/Labs and Tests Ordered: Current medicines are reviewed at length with the patient today.  Concerns regarding medicines are outlined above.  Tests Ordered: No orders of the defined types were placed in this encounter.  Medication Changes: No orders of the defined types were placed in this encounter.   Signed, Richardson Dopp, PA-C  04/24/2019 10:39 PM    [facility] [place] [address], [place], [place]  [address] Phone: [phone]; Fax: [phone]

## 2019-04-25 ENCOUNTER — Ambulatory Visit: Payer: Medicare Other | Admitting: Physician Assistant

## 2019-04-27 DIAGNOSIS — M7989 Other specified soft tissue disorders: Secondary | ICD-10-CM | POA: Diagnosis not present

## 2019-04-27 DIAGNOSIS — S6990XA Unspecified injury of unspecified wrist, hand and finger(s), initial encounter: Secondary | ICD-10-CM | POA: Diagnosis not present

## 2019-04-30 ENCOUNTER — Other Ambulatory Visit: Payer: Self-pay | Admitting: Internal Medicine

## 2019-05-02 ENCOUNTER — Other Ambulatory Visit: Payer: Self-pay

## 2019-05-02 MED ORDER — LISINOPRIL 10 MG PO TABS: 10.0000 mg | ORAL_TABLET | Freq: Every day | ORAL | 3 refills | Status: DC

## 2019-05-04 ENCOUNTER — Ambulatory Visit: Payer: Medicare Other | Admitting: Neurology

## 2019-05-05 NOTE — Progress Notes (Signed)
Virtual Visit via Telephone Note   This visit type was conducted due to national recommendations for restrictions regarding the COVID-19 Pandemic (e.g. social distancing) in an effort to limit this patient's exposure and mitigate transmission in our community.  Due to her co-morbid illnesses, this patient is at least at moderate risk for complications without adequate follow up.  This format is felt to be most appropriate for this patient at this time.  The patient did not have access to video technology/had technical difficulties with video requiring transitioning to audio format only (telephone).  All issues noted in this document were discussed and addressed.  No physical exam could be performed with this format.  Please refer to the patient's chart for her  consent to telehealth for [facility].   Date:  05/06/2019   ID:  [name], DOB 07-Mar-1941, MRN [id number]  Patient Location: Home Provider Location: Home  PCP:  Kathyrn Lass, MD  Cardiologist:  Sherren Mocha, MD / Richardson Dopp, PA-C  Electrophysiologist:  None   Evaluation Performed:  Follow-Up Visit  Chief Complaint:  Hypertension, Hyperlipidemia   History of Present Illness:    [name] is a 79 y.o. female with    Hypertension  GERD  Autonomic insufficiency  Migraine headaches  History of prior DVT (after lumbar laminectomy 6/13) ? Postop developed orthostatic hypotension; postural tachycardia  Palpitations  Hyperlipidemia  She was last seen in clinic in August 2020.  She has not had chest pain, shortness of breath, syncope.  Her husband fell and broke his hip. He is back home now and doing better.  Her BP is typically ranging in the 0000000 (systolic).  She stopped taking rosuvastatin secondary to a headache.   Past Medical History:  Diagnosis Date  . Allergy   . Arthritis    "got alot; especially in my spine"  . Chest pain 02/2016  . Chronic back pain    "all of it"  . Classical  migraine with intractable migraine 02/25/2016  . Diverticulosis of colon (without mention of hemorrhage)   . DVT of leg (deep venous thrombosis) ([place]) 09/2011   LLE  . Dysrhythmia    notes in epic  . Esophageal reflux   . Family history of malignant neoplasm of gastrointestinal tract   . Fibromyalgia    "dx'd w/it when it first came out"  . Hiatal hernia   . Hyperlipidemia   . Hypertension   . Migraines 09/24/11   "often recently;  more controlled now"  . Obesity, unspecified   . Osteoporosis   . Palpitations    event monitor 8/13: NSR, Sinus brady, Sinus Tachy, PAC  . Personal history of colonic polyps 05/31/2007   adenomatous polyp  . Pneumonia    "3 times at least" (09/24/11)  . Rheumatoid arthritis(714.0)   . Scoliosis   . Stricture and stenosis of esophagus   . Stroke ([place]) 08/2016  . Unspecified gastritis and gastroduodenitis without mention of hemorrhage   1. GERD with esophageal stricture and hiatal hernia.  2. Low back pain: s/p lumbar laminectomy in 6/13.  3. Hyperlipidemia 4. HTN 5. Migraines.  6. Echo (7/13): EF 65-70%, mild LVH  7. Scoliosis 8. B12 deficiency 9. Osteoarthritis 10. Diverticulitis with surgery in 2001 11. Atypical chest pain: Lexiscan myoview (2/13) with EF 80%, breast attenuation, no evidence for ischemia or infarction.  12. Palpitations: Holter (2/13) with occasional PACs.  13. Carotid dopplers (7/13) with minimal disease.  14. Autonomic insufficiency: post-lumbar laminectomy in 6/13.  Predominantly manifested as postural tachycardia.  15. DVT 6/13 post-surgery.     Current Meds  Medication Sig  . acetaminophen (TYLENOL) 500 MG tablet Take 500 mg by mouth daily as needed for moderate pain.  [name] albuterol (PROVENTIL HFA;VENTOLIN HFA) 108 (90 Base) MCG/ACT inhaler Inhale 2 puffs into the lungs every 6 (six) hours as needed for wheezing or shortness of breath.  [name] aspirin EC 81 MG tablet Take 81 mg by mouth daily.   . calcium carbonate  (TUMS EX) 750 MG chewable tablet Chew 2 tablets by mouth daily as needed for heartburn.   . Cyanocobalamin (VITAMIN B-12 IJ) Inject as directed every 30 (thirty) days.  . Dextran 70-Hypromellose ([employer] NATURAL TEARS OP) Apply 1-2 drops to eye daily as needed (dry eyes).  . fluticasone (FLONASE) 50 MCG/ACT nasal spray Place 1 spray into both nostrils daily as needed for allergies.  . hydrochlorothiazide (HYDRODIURIL) 25 MG tablet Take 1 tablet (25 mg total) by mouth daily.  [name] loratadine (CLARITIN) 10 MG tablet Take 10 mg by mouth daily as needed for allergies.  . metoprolol tartrate (LOPRESSOR) 50 MG tablet Take 0.5 tablets (25 mg total) by mouth 2 (two) times daily.  [name] neomycin-bacitracin-polymyxin (NEOSPORIN) ointment Apply 1 application topically as needed for wound care.  . pantoprazole (PROTONIX) 40 MG tablet TAKE 1 TABLET(40 MG) BY MOUTH TWICE DAILY BEFORE A MEAL  . polyethylene glycol (MIRALAX / GLYCOLAX) packet Take 17 g by mouth daily as needed for mild constipation.  . promethazine (PHENERGAN) 25 MG tablet Take 1 tablet (25 mg total) by mouth every 6 (six) hours as needed for nausea or vomiting.  . SUMAtriptan (IMITREX) 100 MG tablet Take 1 tablet at onset of migraine. Can take 1 additional tablet in 2 hours once if needed. Do not take more than 2 tablets in 24 hours.  . [DISCONTINUED] lisinopril (ZESTRIL) 10 MG tablet Take 1 tablet (10 mg total) by mouth daily.     Allergies:   Reglan [metoclopramide]   Social History   Tobacco Use  . Smoking status: Never Smoker  . Smokeless tobacco: Never Used  Substance Use Topics  . Alcohol use: No  . Drug use: No     Family Hx: The patient's family history includes Colon cancer (age of onset: 3) in her sister; Diabetes in her father and sister; Hypertension in her father and mother; Pancreatic cancer in her sister and sister; Stroke in her father and mother. There is no history of Heart attack or Stomach cancer.  ROS:   Please see the  history of present illness.      Prior CV studies:   The following studies were reviewed today:  Echo 02/13/2016 Mild LVH, EF 50-55, normal wall motion  Echo (7/13):  Mild LVH, EF 65-70%  Nuclear (2/13):  Normal stress nuclear study. Mild anteroapical attenuation from breast shadow. EF 80%   Labs/Other Tests and Data Reviewed:    EKG:  No ECG reviewed.  Recent Labs: 10/20/2018: ALT 9; BUN 16; Creatinine, Ser 0.85; Potassium 5.1; Sodium 140   Recent Lipid Panel Lab Results  Component Value Date/Time   CHOL 215 (H) 04/08/2019 12:21 PM   TRIG 122 04/08/2019 12:21 PM   HDL 60 04/08/2019 12:21 PM   CHOLHDL 3.6 04/08/2019 12:21 PM   CHOLHDL 4.5 06/06/2007 12:40 AM   LDLCALC 133 (H) 04/08/2019 12:21 PM    Wt Readings from Last 3 Encounters:  05/06/19 188 lb (85.3 kg)  10/20/18 190 lb (86.2 kg)  04/27/18 188 lb 6.4 oz (85.5 kg)     Objective:    Vital Signs:  BP 140/67   Pulse 66   Ht 5\' 1"  (1.549 m)   Wt 188 lb (85.3 kg)   LMP  (LMP Unknown)   BMI 35.52 kg/m    VITAL SIGNS:  reviewed GEN:  no acute distress RESPIRATORY:  no labored breathing NEURO:  alert and oriented PSYCH:  normal affect  ASSESSMENT & PLAN:    1. Essential hypertension Blood pressure above target.  I have recommended increasing lisinopril to 20 mg daily.  Arrange follow-up BMET in 2 weeks.  2. Dyslipidemia The 10-year ASCVD risk score Mikey Bussing DC Jr., et al., 2013) is: 54.1%   Values used to calculate the score:     Age: 72 years     Sex: Female     Is Non-Hispanic African American: No     Diabetic: Yes     Tobacco smoker: No     Systolic Blood Pressure: [id number] mmHg     Is BP treated: Yes     HDL Cholesterol: 60 mg/dL     Total Cholesterol: 215 mg/dL  We discussed the importance of cholesterol management to reduce future risk.  She is willing to try rosuvastatin again.  If she continues to have side effects with statins, consider referral to the lipid clinic for possible PCSK9  inhibitor versus bempedoic acid.   Time:   Today, I have spent 30 minutes with the patient with telehealth technology discussing the above problems.     Medication Adjustments/Labs and Tests Ordered: Current medicines are reviewed at length with the patient today.  Concerns regarding medicines are outlined above.   Tests Ordered: Orders Placed This Encounter  Procedures  . Basic metabolic panel  . Hepatic function panel  . Lipid panel    Medication Changes: Meds ordered this encounter  Medications  . lisinopril (ZESTRIL) 20 MG tablet    Sig: Take 1 tablet (20 mg total) by mouth daily.    Dispense:  90 tablet    Refill:  3    Dose increase.  Pt will call when ready to fill    Order Specific Question:   Supervising Provider    Answer:   Lelon Perla [[id number]]  . rosuvastatin (CRESTOR) 5 MG tablet    Sig: Take 1 three times a week    Dispense:  36 tablet    Refill:  3    Order Specific Question:   Supervising Provider    Answer:   Lelon Perla [[id number]]    Follow Up:  In Person in 6 month(s)  Signed, Richardson Dopp, PA-C  05/06/2019 1:31 PM    [facility]

## 2019-05-06 ENCOUNTER — Telehealth (INDEPENDENT_AMBULATORY_CARE_PROVIDER_SITE_OTHER): Payer: Medicare Other | Admitting: Physician Assistant

## 2019-05-06 ENCOUNTER — Other Ambulatory Visit: Payer: Self-pay

## 2019-05-06 ENCOUNTER — Encounter: Payer: Self-pay | Admitting: Physician Assistant

## 2019-05-06 ENCOUNTER — Ambulatory Visit: Payer: Medicare Other

## 2019-05-06 VITALS — BP 140/67 | HR 66 | Ht 61.0 in | Wt 188.0 lb

## 2019-05-06 DIAGNOSIS — E785 Hyperlipidemia, unspecified: Secondary | ICD-10-CM

## 2019-05-06 DIAGNOSIS — I10 Essential (primary) hypertension: Secondary | ICD-10-CM

## 2019-05-06 MED ORDER — LISINOPRIL 20 MG PO TABS: 20.0000 mg | ORAL_TABLET | Freq: Every day | ORAL | 3 refills | Status: DC

## 2019-05-06 MED ORDER — ROSUVASTATIN CALCIUM 5 MG PO TABS: ORAL_TABLET | ORAL | 3 refills | Status: DC

## 2019-05-06 NOTE — Patient Instructions (Signed)
Medication Instructions:   Your physician has recommended you make the following change in your medication:   1) Increase your Lisinopril to 20 mg, 1 tablet by mouth once a day 2) Restart Crestor 5 mg, 1 tablet by mouth three times a week  *If you need a refill on your cardiac medications before your next appointment, please call your pharmacy*   Lab Work:  Your physician recommends that you return for lab work in: 2 weeks for a BMET and 3 months for a fasting lipid panel and LFTs  If you have labs (blood work) drawn today and your tests are completely normal, you will receive your results only by: [name] MyChart Message (if you have MyChart) OR . A paper copy in the mail If you have any lab test that is abnormal or we need to change your treatment, we will call you to review the results.   Testing/Procedures:  None ordered today   Follow-Up: At [facility], you and your health needs are our priority.  As part of our continuing mission to provide you with exceptional heart care, we have created designated Provider Care Teams.  These Care Teams include your primary Cardiologist (physician) and Advanced Practice Providers (APPs -  Physician Assistants and Nurse Practitioners) who all work together to provide you with the care you need, when you need it.  We recommend signing up for the patient portal called "MyChart".  Sign up information is provided on this After Visit Summary.  MyChart is used to connect with patients for Virtual Visits (Telemedicine).  Patients are able to view lab/test results, encounter notes, upcoming appointments, etc.  Non-urgent messages can be sent to your provider as well.   To learn more about what you can do with MyChart, go to [email or web address].    Your next appointment:   6 month(s)  The format for your next appointment:   In Person  Provider:   Richardson Dopp, PA-C

## 2019-05-10 DIAGNOSIS — E538 Deficiency of other specified B group vitamins: Secondary | ICD-10-CM | POA: Diagnosis not present

## 2019-05-20 ENCOUNTER — Other Ambulatory Visit: Payer: Medicare Other | Admitting: *Deleted

## 2019-05-20 ENCOUNTER — Other Ambulatory Visit: Payer: Self-pay

## 2019-05-20 DIAGNOSIS — I10 Essential (primary) hypertension: Secondary | ICD-10-CM | POA: Diagnosis not present

## 2019-05-21 LAB — BASIC METABOLIC PANEL
BUN/Creatinine Ratio: 17 (ref 12–28)
BUN: 18 mg/dL (ref 8–27)
CO2: 27 mmol/L (ref 20–29)
Calcium: 9.5 mg/dL (ref 8.7–10.3)
Chloride: 102 mmol/L (ref 96–106)
Creatinine, Ser: 1.05 mg/dL — ABNORMAL HIGH (ref 0.57–1.00)
GFR calc Af Amer: 59 mL/min/{1.73_m2} — ABNORMAL LOW (ref >59)
GFR calc non Af Amer: 51 mL/min/{1.73_m2} — ABNORMAL LOW (ref >59)
Glucose: 86 mg/dL (ref 65–99)
Potassium: 4.5 mmol/L (ref 3.5–5.2)
Sodium: 143 mmol/L (ref 134–144)

## 2019-05-23 ENCOUNTER — Encounter: Payer: Self-pay | Admitting: Neurology

## 2019-05-23 ENCOUNTER — Ambulatory Visit: Payer: Medicare Other | Admitting: Neurology

## 2019-05-23 ENCOUNTER — Other Ambulatory Visit: Payer: Self-pay

## 2019-05-23 VITALS — BP 171/72 | HR 62 | Temp 97.3°F | Ht 61.0 in | Wt 193.0 lb

## 2019-05-23 DIAGNOSIS — I671 Cerebral aneurysm, nonruptured: Secondary | ICD-10-CM

## 2019-05-23 DIAGNOSIS — G43119 Migraine with aura, intractable, without status migrainosus: Secondary | ICD-10-CM | POA: Diagnosis not present

## 2019-05-23 MED ORDER — SUMATRIPTAN SUCCINATE 50 MG PO TABS: ORAL_TABLET | ORAL | 1 refills | Status: DC

## 2019-05-23 MED ORDER — PROMETHAZINE HCL 25 MG PO TABS: 25.0000 mg | ORAL_TABLET | Freq: Four times a day (QID) | ORAL | 1 refills | Status: DC | PRN

## 2019-05-23 NOTE — Patient Instructions (Signed)
I will refill your phenergan, let's try a lower dose of Imitrex 50 mg as needed See you back in 1 year

## 2019-05-23 NOTE — Progress Notes (Signed)
I have read the note, and I agree with the clinical assessment and plan.  Manson Luckadoo K Lonnel Gjerde   

## 2019-05-23 NOTE — Progress Notes (Signed)
PATIENT: [name] DOB: 11-22-1940  REASON FOR VISIT: follow up HISTORY FROM: patient  HISTORY OF PRESENT ILLNESS: Today 05/23/19  [name] is a 79 year old female with history of migraine headaches.  She is on a beta-blocker and uses Imitrex and Phenergan on an as-needed basis.  She has been doing really well.  In the last year she has only had 3 headaches.  With her headaches, she will have a visual aura, with colors, zigzags before the actual headache.  She will take Imitrex and Phenergan with excellent benefit, and lay down.  She says her overall health has been good, she thinks she needs a right hip replacement.  She lives with her husband, is his caregiver.  She needs refills on her medications, she takes them infrequently they have expired. Blood pressure is elevated today, she has yet to take her medications. She does mention she was in the hospital in 2018, MRA showed small right and possibly left paraophthalmic ICA aneurysm.  She presents today for evaluation unaccompanied.  HISTORY 04/27/2018 SS: [name] is a 79 year old female with a history of migraine headaches.  She is on a beta-blocker and she uses Imitrex on an as-needed basis.  She is doing really well with her headaches and she reports in the last year she has only had 2 headaches.  She reports that with both of these headaches she had an aura beforehand with visual features.  She was able to take Imitrex early on and prevent actual head pain.  She reports that she will take a Phenergan with the Imitrex and that provides good benefit for her.  She reports next week she will be having cataract surgery on her right eye.  She reports that she was recently diagnosed with diabetes 6 months ago and is managing it with lifestyle changes.  She is very happy with her current headache control.  She presents today for follow-up.   REVIEW OF SYSTEMS: Out of a complete 14 system review of symptoms, the patient complains only of  the following symptoms, and all other reviewed systems are negative.  Headache  ALLERGIES: Allergies  Allergen Reactions  . Reglan [Metoclopramide] Other (See Comments)    Speech is garbled, extreme trouble making words    HOME MEDICATIONS: Outpatient Medications Prior to Visit  Medication Sig Dispense Refill  . acetaminophen (TYLENOL) 500 MG tablet Take 500 mg by mouth daily as needed for moderate pain.    [name] albuterol (PROVENTIL HFA;VENTOLIN HFA) 108 (90 Base) MCG/ACT inhaler Inhale 2 puffs into the lungs every 6 (six) hours as needed for wheezing or shortness of breath.    [name] aspirin EC 81 MG tablet Take 81 mg by mouth daily.     . calcium carbonate (TUMS EX) 750 MG chewable tablet Chew 2 tablets by mouth daily as needed for heartburn.     . Cyanocobalamin (VITAMIN B-12 IJ) Inject as directed every 30 (thirty) days.    . Dextran 70-Hypromellose ([employer] NATURAL TEARS OP) Apply 1-2 drops to eye daily as needed (dry eyes).    . fluticasone (FLONASE) 50 MCG/ACT nasal spray Place 1 spray into both nostrils daily as needed for allergies.  1  . hydrochlorothiazide (HYDRODIURIL) 25 MG tablet Take 1 tablet (25 mg total) by mouth daily. 90 tablet 3  . lisinopril (ZESTRIL) 20 MG tablet Take 1 tablet (20 mg total) by mouth daily. 90 tablet 3  . loratadine (CLARITIN) 10 MG tablet Take 10 mg by mouth daily as needed  for allergies.    . metoprolol tartrate (LOPRESSOR) 50 MG tablet Take 0.5 tablets (25 mg total) by mouth 2 (two) times daily. 90 tablet 1  . neomycin-bacitracin-polymyxin (NEOSPORIN) ointment Apply 1 application topically as needed for wound care.    . pantoprazole (PROTONIX) 40 MG tablet TAKE 1 TABLET(40 MG) BY MOUTH TWICE DAILY BEFORE A MEAL 180 tablet 0  . polyethylene glycol (MIRALAX / GLYCOLAX) packet Take 17 g by mouth daily as needed for mild constipation.    . promethazine (PHENERGAN) 25 MG tablet Take 1 tablet (25 mg total) by mouth every 6 (six) hours as needed for nausea or  vomiting. 30 tablet 2  . rosuvastatin (CRESTOR) 5 MG tablet Take 1 three times a week 36 tablet 3  . SUMAtriptan (IMITREX) 100 MG tablet Take 1 tablet at onset of migraine. Can take 1 additional tablet in 2 hours once if needed. Do not take more than 2 tablets in 24 hours. 10 tablet 3   No facility-administered medications prior to visit.    PAST MEDICAL HISTORY: Past Medical History:  Diagnosis Date  . Allergy   . Arthritis    "got alot; especially in my spine"  . Chest pain 02/2016  . Chronic back pain    "all of it"  . Classical migraine with intractable migraine 02/25/2016  . Diverticulosis of colon (without mention of hemorrhage)   . DVT of leg (deep venous thrombosis) ([facility]) 09/2011   LLE  . Dysrhythmia    notes in epic  . Esophageal reflux   . Family history of malignant neoplasm of gastrointestinal tract   . Fibromyalgia    "dx'd w/it when it first came out"  . Hiatal hernia   . Hyperlipidemia   . Hypertension   . Migraines 09/24/11   "often recently;  more controlled now"  . Obesity, unspecified   . Osteoporosis   . Palpitations    event monitor 8/13: NSR, Sinus brady, Sinus Tachy, PAC  . Personal history of colonic polyps 05/31/2007   adenomatous polyp  . Pneumonia    "3 times at least" (09/24/11)  . Rheumatoid arthritis(714.0)   . Scoliosis   . Stricture and stenosis of esophagus   . Stroke ([facility]) 08/2016  . Unspecified gastritis and gastroduodenitis without mention of hemorrhage     PAST SURGICAL HISTORY: Past Surgical History:  Procedure Laterality Date  . BACK SURGERY    . BREAST CYST INCISION AND DRAINAGE  2012   left; "golf-ball sized"  . BREAST SURGERY     cyst lanced and drainded  . COLON SURGERY    . COLONOSCOPY  2009  . COLONOSCOPY WITH PROPOFOL N/A 03/16/2017   Procedure: COLONOSCOPY WITH PROPOFOL;  Surgeon: Gatha Mayer, MD;  Location: [facility];  Service: Endoscopy;  Laterality: N/A;  . ESOPHAGEAL DILATION     "several times"  .  ESOPHAGOGASTRODUODENOSCOPY (EGD) WITH PROPOFOL N/A 03/16/2017   Procedure: ESOPHAGOGASTRODUODENOSCOPY (EGD) WITH PROPOFOL;  Surgeon: Gatha Mayer, MD;  Location: [facility];  Service: Endoscopy;  Laterality: N/A;  . EXPLORATORY LAPAROTOMY  04/2000   "for ruptured colon"  . HEMATOMA EVACUATION  09/2011   LLE  . LUMBAR LAMINECTOMY/DECOMPRESSION MICRODISCECTOMY  09/03/2011   Procedure: LUMBAR LAMINECTOMY/DECOMPRESSION MICRODISCECTOMY 2 LEVELS;  Surgeon: Elaina Hoops, MD;  Location: [facility];  Service: Neurosurgery;  Laterality: Left;  Left Lumbar Three-Four, Lumbar Four-Five Decompressive Laminectomy  . SALIVARY GLAND SURGERY  ~ 1980   1 removed  . SHOULDER SURGERY  2008  right shoulder and collar bone  . SIGMOID RESECTION / RECTOPEXY  09/1999   Dr Rosana Hoes  . TRIGGER FINGER RELEASE  2004   left thumb  . TUMOR REMOVAL  1960's   left leg  . UPPER GASTROINTESTINAL ENDOSCOPY      FAMILY HISTORY: Family History  Problem Relation Age of Onset  . Stroke Mother   . Hypertension Mother   . Stroke Father   . Hypertension Father   . Diabetes Father   . Pancreatic cancer Sister   . Diabetes Sister        x 2  . Colon cancer Sister 19  . Pancreatic cancer Sister   . Heart attack Neg Hx   . Stomach cancer Neg Hx     SOCIAL HISTORY: Social History   Socioeconomic History  . Marital status: Married    Spouse name: Not on file  . Number of children: 1  . 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 Highest education level: Not on file  Occupational History    Employer: RETIRED  Tobacco Use  . Smoking status: Never Smoker  . Smokeless tobacco: Never Used  Substance and Sexual Activity  . Alcohol use: No  . Drug use: No  . Sexual activity: Not Currently    Partners: Male    Birth control/protection: Post-menopausal  Other Topics Concern  . Not on file  Social History Narrative   Lives at home w/ her husband   Right-handed   Caffeine: usually drinks decaf and tea   Social  Determinants of Health   Financial Resource Strain:   . Difficulty of Paying Living Expenses:   Food Insecurity:   . Worried About Charity fundraiser in the Last Year:   . Arboriculturist in the Last Year:   Transportation Needs:   . Film/video editor (Medical):   [name] Lack of Transportation (Non-Medical):   Physical Activity:   . Days of Exercise per Week:   . Minutes of Exercise per Session:   Stress:   . Feeling of Stress :   Social Connections:   . Frequency of Communication with Friends and Family:   . Frequency of Social Gatherings with Friends and Family:   . Attends Religious Services:   . Active Member of Clubs or Organizations:   . Attends Archivist Meetings:   [name] Marital Status:   Intimate Partner Violence:   . Fear of Current or Ex-Partner:   . Emotionally Abused:   [name] Physically Abused:   . Sexually Abused:       PHYSICAL EXAM  Vitals:   05/23/19 1239  BP: (!) 171/72  Pulse: 62  Temp: (!) 97.3 F (36.3 C)  Weight: 193 lb (87.5 kg)  Height: 5\' 1"  (1.549 m)   Body mass index is 36.47 kg/m.  Generalized: Well developed, in no acute distress   Neurological examination  Mentation: Alert oriented to time, place, history taking. Follows all commands speech and language fluent Cranial nerve II-XII: Pupils were equal round reactive to light. Extraocular movements were full, visual field were full on confrontational test. Facial sensation and strength were normal.  Head turning and shoulder shrug  were normal and symmetric. Motor: The motor testing reveals 5 over 5 strength of all 4 extremities. Good symmetric motor tone is noted throughout.  Sensory: Sensory testing is intact to soft touch on all 4 extremities. No evidence of extinction is noted.  Coordination: Cerebellar testing reveals good finger-nose-finger and heel-to-shin  bilaterally.  Gait and station: Has to push off from seated position to stand, gait is antalgic right hip pain Reflexes:  Deep tendon reflexes are symmetric   DIAGNOSTIC DATA (LABS, IMAGING, TESTING) - I reviewed patient records, labs, notes, testing and imaging myself where available.  Lab Results  Component Value Date   WBC 6.3 01/25/2018   HGB 15.0 01/25/2018   HCT 45.1 01/25/2018   MCV 85 01/25/2018   PLT 296 01/25/2018      Component Value Date/Time   NA 143 05/20/2019 1255   K 4.5 05/20/2019 1255   CL 102 05/20/2019 1255   CO2 27 05/20/2019 1255   GLUCOSE 86 05/20/2019 1255   GLUCOSE 160 (H) 08/20/2016 0342   BUN 18 05/20/2019 1255   CREATININE 1.05 (H) 05/20/2019 1255   CREATININE 0.93 01/21/2016 1237   CALCIUM 9.5 05/20/2019 1255   PROT 6.5 10/20/2018 1148   ALBUMIN 4.4 10/20/2018 1148   AST 17 10/20/2018 1148   ALT 9 10/20/2018 1148   ALKPHOS 57 10/20/2018 1148   BILITOT 0.9 10/20/2018 1148   GFRNONAA 51 (L) 05/20/2019 1255   GFRAA 59 (L) 05/20/2019 1255   Lab Results  Component Value Date   CHOL 215 (H) 04/08/2019   HDL 60 04/08/2019   LDLCALC 133 (H) 04/08/2019   TRIG 122 04/08/2019   CHOLHDL 3.6 04/08/2019   No results found for: HGBA1C Lab Results  Component Value Date   VITAMINB12 188 (L) 02/06/2011   Lab Results  Component Value Date   TSH 2.569 02/13/2016      ASSESSMENT AND PLAN 79 y.o. year old female  has a past medical history of Allergy, Arthritis, Chest pain (02/2016), Chronic back pain, Classical migraine with intractable migraine (02/25/2016), Diverticulosis of colon (without mention of hemorrhage), DVT of leg (deep venous thrombosis) ([facility]) (09/2011), Dysrhythmia, Esophageal reflux, Family history of malignant neoplasm of gastrointestinal tract, Fibromyalgia, Hiatal hernia, Hyperlipidemia, Hypertension, Migraines (09/24/11), Obesity, unspecified, Osteoporosis, Palpitations, Personal history of colonic polyps (05/31/2007), Pneumonia, Rheumatoid arthritis(714.0), Scoliosis, Stricture and stenosis of esophagus, Stroke ([facility]) (08/2016), and Unspecified gastritis and  gastroduodenitis without mention of hemorrhage. here with:  1.  Headache  Her headaches continue to do well.  They are very infrequent, she has had 3 headaches in the last year.  She will remain on Imitrex and Phenergan as needed for acute headache.  I will try to decrease her dose of Imitrex to 50 mg, given her age.  If she requires a higher dose, she will let me know.  I will reorder MRA head without contrast given abnormality in June 2018, small right and possibly left paraophthalmic ICA aneurysms.  She will follow-up in 1 year or sooner if needed.  MRA Head/MRI brain with and without contrast 08/20/2016 IMPRESSION: 1. Subcentimeter focus of diffusion abnormality in the subcortical right occipital lobe which may reflect a subacute infarct. 2. Mild chronic small vessel ischemic disease. 3. Intracranial atherosclerosis as above including severe bilateral P2 stenoses. 4. 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   I spent 15 minutes with the patient. 50% of this time was spent discussing her plan of care.   Butler Denmark, AGNP-C, DNP 05/23/2019, 12:52 PM [facility] [address] [place], [place] [address] [phone]

## 2019-05-24 ENCOUNTER — Telehealth: Payer: Self-pay | Admitting: Neurology

## 2019-05-24 NOTE — Telephone Encounter (Signed)
Phone rep checked office voicemail's,pt left a voicemail stating she thinks she would like to hold off on her MRI at this time.  Pt is asking for a call to discuss further.

## 2019-05-24 NOTE — Addendum Note (Signed)
Addended by: Brandon Melnick on: 05/24/2019 12:37 PM   Modules accepted: Orders

## 2019-05-24 NOTE — Telephone Encounter (Signed)
Noted, thank you

## 2019-05-24 NOTE — Telephone Encounter (Signed)
I cancelled the order from yesterdays ofv visit.

## 2019-05-24 NOTE — Telephone Encounter (Signed)
I called the patient, she has decided to hold off on rechecking MRA, she says doing so well wants to hold off for now. We can continue to follow this, her headaches seem well controlled at this time. If she changes her mind, she will let me know.

## 2019-05-24 NOTE — Telephone Encounter (Signed)
I called pt and she stated since she was doing well, and after sepaking to her husband, she would like to cancel the MRI scheduled.  If things worsen then she would let us know and then could be initiated again if that is agreeable to SS/NP.  I will let her know.

## 2019-05-31 ENCOUNTER — Other Ambulatory Visit: Payer: Self-pay

## 2019-05-31 DIAGNOSIS — I10 Essential (primary) hypertension: Secondary | ICD-10-CM

## 2019-06-06 ENCOUNTER — Other Ambulatory Visit: Payer: Medicare Other | Admitting: *Deleted

## 2019-06-06 ENCOUNTER — Other Ambulatory Visit: Payer: Self-pay

## 2019-06-06 DIAGNOSIS — I10 Essential (primary) hypertension: Secondary | ICD-10-CM | POA: Diagnosis not present

## 2019-06-08 LAB — BASIC METABOLIC PANEL
BUN/Creatinine Ratio: 20 (ref 12–28)
BUN: 20 mg/dL (ref 8–27)
CO2: 25 mmol/L (ref 20–29)
Calcium: 9.2 mg/dL (ref 8.7–10.3)
Chloride: 100 mmol/L (ref 96–106)
Creatinine, Ser: 1.02 mg/dL — ABNORMAL HIGH (ref 0.57–1.00)
GFR calc Af Amer: 61 mL/min/{1.73_m2} (ref >59)
GFR calc non Af Amer: 53 mL/min/{1.73_m2} — ABNORMAL LOW (ref >59)
Glucose: 90 mg/dL (ref 65–99)
Potassium: 4.4 mmol/L (ref 3.5–5.2)
Sodium: 138 mmol/L (ref 134–144)

## 2019-06-14 DIAGNOSIS — E538 Deficiency of other specified B group vitamins: Secondary | ICD-10-CM | POA: Diagnosis not present

## 2019-06-15 DIAGNOSIS — M79644 Pain in right finger(s): Secondary | ICD-10-CM | POA: Diagnosis not present

## 2019-06-15 DIAGNOSIS — S62524A Nondisplaced fracture of distal phalanx of right thumb, initial encounter for closed fracture: Secondary | ICD-10-CM | POA: Diagnosis not present

## 2019-06-23 DIAGNOSIS — I10 Essential (primary) hypertension: Secondary | ICD-10-CM | POA: Diagnosis not present

## 2019-06-23 DIAGNOSIS — E7800 Pure hypercholesterolemia, unspecified: Secondary | ICD-10-CM | POA: Diagnosis not present

## 2019-06-23 DIAGNOSIS — E538 Deficiency of other specified B group vitamins: Secondary | ICD-10-CM | POA: Diagnosis not present

## 2019-06-23 DIAGNOSIS — J302 Other seasonal allergic rhinitis: Secondary | ICD-10-CM | POA: Diagnosis not present

## 2019-07-15 DIAGNOSIS — E538 Deficiency of other specified B group vitamins: Secondary | ICD-10-CM | POA: Diagnosis not present

## 2019-08-03 ENCOUNTER — Other Ambulatory Visit: Payer: Self-pay

## 2019-08-03 ENCOUNTER — Other Ambulatory Visit: Payer: Medicare Other | Admitting: *Deleted

## 2019-08-03 DIAGNOSIS — E785 Hyperlipidemia, unspecified: Secondary | ICD-10-CM

## 2019-08-03 LAB — HEPATIC FUNCTION PANEL
ALT: 8 IU/L (ref 0–32)
AST: 12 IU/L (ref 0–40)
Albumin: 4.4 g/dL (ref 3.7–4.7)
Alkaline Phosphatase: 57 IU/L (ref 48–121)
Bilirubin Total: 1.1 mg/dL (ref 0.0–1.2)
Bilirubin, Direct: 0.27 mg/dL (ref 0.00–0.40)
Total Protein: 6.7 g/dL (ref 6.0–8.5)

## 2019-08-03 LAB — LIPID PANEL
Chol/HDL Ratio: 2.9 ratio (ref 0.0–4.4)
Cholesterol, Total: 174 mg/dL (ref 100–199)
HDL: 61 mg/dL (ref >39)
LDL Chol Calc (NIH): 94 mg/dL (ref 0–99)
Triglycerides: 106 mg/dL (ref 0–149)
VLDL Cholesterol Cal: 19 mg/dL (ref 5–40)

## 2019-08-04 DIAGNOSIS — S62524D Nondisplaced fracture of distal phalanx of right thumb, subsequent encounter for fracture with routine healing: Secondary | ICD-10-CM | POA: Diagnosis not present

## 2019-08-05 ENCOUNTER — Telehealth: Payer: Self-pay | Admitting: Physician Assistant

## 2019-08-05 NOTE — Telephone Encounter (Signed)
New message ° ° °Patient is returning call for lab results. Please call. °

## 2019-08-05 NOTE — Telephone Encounter (Signed)
The patient has been notified of the result and verbalized understanding.  All questions (if any) were answered. Mady Haagensen, [place] 08/05/2019 4:13 PM

## 2019-08-15 DIAGNOSIS — D510 Vitamin B12 deficiency anemia due to intrinsic factor deficiency: Secondary | ICD-10-CM | POA: Diagnosis not present

## 2019-09-14 ENCOUNTER — Other Ambulatory Visit: Payer: Self-pay | Admitting: Physician Assistant

## 2019-09-15 DIAGNOSIS — E538 Deficiency of other specified B group vitamins: Secondary | ICD-10-CM | POA: Diagnosis not present

## 2019-10-18 DIAGNOSIS — E538 Deficiency of other specified B group vitamins: Secondary | ICD-10-CM | POA: Diagnosis not present

## 2019-11-15 ENCOUNTER — Telehealth: Payer: Self-pay | Admitting: Physician Assistant

## 2019-11-15 DIAGNOSIS — R002 Palpitations: Secondary | ICD-10-CM

## 2019-11-15 NOTE — Telephone Encounter (Signed)
Patient reports "real, faint quivering" in chest, happens at night.  Wakes her up.  Never while awake.  Would notice occasionally.  Noting more often in last week. Left hand is asleep some of the times the quivering wakes her up.   Her husband passed away in 22-Jul-2022.  Social security decreased.  Feeling a lot of stress.  Not sure if has sleep apnea, no morning headaches.   BPs recently:  130/62 55 today 134/58 64 today 165/65 65 (5am today)  155/80 61   104/79 132/58  Aware I am forwarding to Richardson Dopp, PA-C and we will call her back once he replies.  She is due for her 6 month follow up right now.

## 2019-11-15 NOTE — Telephone Encounter (Signed)
Patient calling back.   °

## 2019-11-15 NOTE — Telephone Encounter (Signed)
Patient returning call.

## 2019-11-15 NOTE — Telephone Encounter (Signed)
Patient c/o Palpitations:  High priority if patient c/o lightheadedness, shortness of breath, or chest pain  1) How long have you had palpitations/irregular HR/ Afib? Are you having the symptoms now? Palpitations - not right now  2) Are you currently experiencing lightheadedness, SOB or CP? no  3) Do you have a history of afib (atrial fibrillation) or irregular heart rhythm? No  4) Have you checked your BP or HR? (document readings if available): BP 165/65 HR 55  5) Are you experiencing any other symptoms? Happens at night and her left hand goes numb

## 2019-11-15 NOTE — Telephone Encounter (Signed)
Left messages on home and mobile numbers to call back. ?

## 2019-11-16 NOTE — Telephone Encounter (Signed)
I called and spoke with patient, she is aware that [name] wants to order an event monitor. Order placed for 30 day event monitor, patient aware that someone will call to set up monitor. Patient verbalized understanding and thanked me for the call.

## 2019-11-16 NOTE — Telephone Encounter (Signed)
Let's get a 30 day event monitor for palpitations (r/o AFib). Richardson Dopp, PA-C    11/16/2019 2:51 PM

## 2019-11-17 ENCOUNTER — Telehealth: Payer: Self-pay | Admitting: Radiology

## 2019-11-17 DIAGNOSIS — E539 Vitamin B deficiency, unspecified: Secondary | ICD-10-CM | POA: Diagnosis not present

## 2019-11-17 NOTE — Telephone Encounter (Signed)
Enrolled patient for a 30 day [employer] Event monitor to be mailed to patients home. Brief instructions were gone over with the patient and she knows to expect the monitor to arrive in 4-5 days.

## 2019-12-02 DIAGNOSIS — Z0000 Encounter for general adult medical examination without abnormal findings: Secondary | ICD-10-CM | POA: Diagnosis not present

## 2019-12-02 DIAGNOSIS — Z23 Encounter for immunization: Secondary | ICD-10-CM | POA: Diagnosis not present

## 2019-12-02 DIAGNOSIS — I10 Essential (primary) hypertension: Secondary | ICD-10-CM | POA: Diagnosis not present

## 2019-12-02 DIAGNOSIS — Z6837 Body mass index (BMI) 37.0-37.9, adult: Secondary | ICD-10-CM | POA: Diagnosis not present

## 2019-12-05 ENCOUNTER — Encounter (INDEPENDENT_AMBULATORY_CARE_PROVIDER_SITE_OTHER): Payer: Medicare Other

## 2019-12-05 DIAGNOSIS — R002 Palpitations: Secondary | ICD-10-CM

## 2019-12-07 ENCOUNTER — Encounter: Payer: Self-pay | Admitting: Neurology

## 2019-12-07 ENCOUNTER — Telehealth: Payer: Self-pay | Admitting: Neurology

## 2019-12-07 ENCOUNTER — Ambulatory Visit: Payer: Medicare Other | Admitting: Neurology

## 2019-12-07 VITALS — BP 156/70 | HR 77 | Ht 60.0 in | Wt 175.5 lb

## 2019-12-07 DIAGNOSIS — G43119 Migraine with aura, intractable, without status migrainosus: Secondary | ICD-10-CM | POA: Diagnosis not present

## 2019-12-07 DIAGNOSIS — I671 Cerebral aneurysm, nonruptured: Secondary | ICD-10-CM | POA: Diagnosis not present

## 2019-12-07 NOTE — Progress Notes (Signed)
Reason for visit: Migraine headache  [name] is an 79 y.o. female  History of present illness:  [name] is a 79 year old right-handed white female with a history of classic migraine headache.  The patient gets visual aura and then a headache following this, she has had very few headaches recently, only 1 or 2 headaches a year.  She has noticed that if she gets Phenergan and Imitrex in early, she can completely avoid the headache.  Unfortunately, her husband passed away in 07-11-19, she has been under a lot of stress following this.  Within the last month, she has had for typical migraine headaches, with each event, she has been able to abort the headache itself when the visual complaints come on.  She does however have a history of small cerebral aneurysms, she has not had evaluation of her cerebrovascular circulation since 2018.  She denies any symptoms of numbness or weakness of the face, arms, or legs with exception she has had some events of left hand numbness when she wakes up in the morning.  She has also had some recent episodes of palpitations of the heart and currently has a heart monitor in place.  She comes to this office for further evaluation.  Past Medical History:  Diagnosis Date  . Allergy   . Arthritis    "got alot; especially in my spine"  . Chest pain 02/2016  . Chronic back pain    "all of it"  . Classical migraine with intractable migraine 02/25/2016  . Diverticulosis of colon (without mention of hemorrhage)   . DVT of leg (deep venous thrombosis) ([place]) 09/2011   LLE  . Dysrhythmia    notes in epic  . Esophageal reflux   . Family history of malignant neoplasm of gastrointestinal tract   . Fibromyalgia    "dx'd w/it when it first came out"  . Hiatal hernia   . Hyperlipidemia   . Hypertension   . Migraines 09/24/11   "often recently;  more controlled now"  . Obesity, unspecified   . Osteoporosis   . Palpitations    event monitor 8/13: NSR,  Sinus brady, Sinus Tachy, PAC  . Personal history of colonic polyps 05/31/2007   adenomatous polyp  . Pneumonia    "3 times at least" (09/24/11)  . Rheumatoid arthritis(714.0)   . Scoliosis   . Stricture and stenosis of esophagus   . Stroke ([place]) 08/2016  . Unspecified gastritis and gastroduodenitis without mention of hemorrhage     Past Surgical History:  Procedure Laterality Date  . BACK SURGERY    . BREAST CYST INCISION AND DRAINAGE  2012   left; "golf-ball sized"  . BREAST SURGERY     cyst lanced and drainded  . COLON SURGERY    . COLONOSCOPY  2009  . COLONOSCOPY WITH PROPOFOL N/A 03/16/2017   Procedure: COLONOSCOPY WITH PROPOFOL;  Surgeon: Gatha Mayer, MD;  Location: [facility];  Service: Endoscopy;  Laterality: N/A;  . ESOPHAGEAL DILATION     "several times"  . ESOPHAGOGASTRODUODENOSCOPY (EGD) WITH PROPOFOL N/A 03/16/2017   Procedure: ESOPHAGOGASTRODUODENOSCOPY (EGD) WITH PROPOFOL;  Surgeon: Gatha Mayer, MD;  Location: [facility];  Service: Endoscopy;  Laterality: N/A;  . EXPLORATORY LAPAROTOMY  04/2000   "for ruptured colon"  . HEMATOMA EVACUATION  09/2011   LLE  . LUMBAR LAMINECTOMY/DECOMPRESSION MICRODISCECTOMY  09/03/2011   Procedure: LUMBAR LAMINECTOMY/DECOMPRESSION MICRODISCECTOMY 2 LEVELS;  Surgeon: Elaina Hoops, MD;  Location: [facility];  Service: Neurosurgery;  Laterality: Left;  Left Lumbar Three-Four, Lumbar Four-Five Decompressive Laminectomy  . SALIVARY GLAND SURGERY  ~ 1980   1 removed  . SHOULDER SURGERY  2008   right shoulder and collar bone  . SIGMOID RESECTION / RECTOPEXY  09/1999   Dr Rosana Hoes  . TRIGGER FINGER RELEASE  2004   left thumb  . TUMOR REMOVAL  1960's   left leg  . UPPER GASTROINTESTINAL ENDOSCOPY      Family History  Problem Relation Age of Onset  . Stroke Mother   . Hypertension Mother   . Stroke Father   . Hypertension Father   . Diabetes Father   . Pancreatic cancer Sister   . Diabetes Sister        x 2  . Colon cancer  Sister 35  . Pancreatic cancer Sister   . Heart attack Neg Hx   . Stomach cancer Neg Hx     Social history:  reports that she has never smoked. She has never used smokeless tobacco. She reports that she does not drink alcohol and does not use drugs.    Allergies  Allergen Reactions  . Reglan [Metoclopramide] Other (See Comments)    Speech is garbled, extreme trouble making words    Medications:  Prior to Admission medications   Medication Sig Start Date End Date Taking? Authorizing Provider  acetaminophen (TYLENOL) 500 MG tablet Take 500 mg by mouth daily as needed for moderate pain.   Yes [provider]  albuterol (PROVENTIL HFA;VENTOLIN HFA) 108 (90 Base) MCG/ACT inhaler Inhale 2 puffs into the lungs every 6 (six) hours as needed for wheezing or shortness of breath.   Yes [provider]  aspirin EC 81 MG tablet Take 81 mg by mouth daily.    Yes [provider]  calcium carbonate (TUMS EX) 750 MG chewable tablet Chew 2 tablets by mouth daily as needed for heartburn.    Yes [provider]  Cyanocobalamin (VITAMIN B-12 IJ) Inject as directed every 30 (thirty) days.   Yes [provider]  Dextran 70-Hypromellose ([employer] NATURAL TEARS OP) Apply 1-2 drops to eye daily as needed (dry eyes).   Yes [provider]  fluticasone (FLONASE) 50 MCG/ACT nasal spray Place 1 spray into both nostrils daily as needed for allergies. 08/02/16  Yes [provider]  hydrochlorothiazide (HYDRODIURIL) 25 MG tablet Take 1 tablet (25 mg total) by mouth daily. 11/25/18  Yes Weaver, Scott T, PA-C  lisinopril (ZESTRIL) 20 MG tablet Take 1 tablet (20 mg total) by mouth daily. 05/06/19 05/05/20 Yes Weaver, Scott T, PA-C  loratadine (CLARITIN) 10 MG tablet Take 10 mg by mouth daily as needed for allergies.   Yes [provider]  metoprolol tartrate (LOPRESSOR) 25 MG tablet Take 1 tablet (25 mg total) by mouth 2 (two) times daily. 09/15/19  Yes Sherren Mocha, MD  neomycin-bacitracin-polymyxin (NEOSPORIN) ointment Apply 1 application topically as needed for wound care.   Yes [provider]  pantoprazole (PROTONIX) 40 MG tablet TAKE 1 TABLET(40 MG) BY MOUTH TWICE DAILY BEFORE A MEAL 05/02/19  Yes Gatha Mayer, MD  polyethylene glycol (MIRALAX / GLYCOLAX) packet Take 17 g by mouth daily as needed for mild constipation.   Yes [provider]  promethazine (PHENERGAN) 25 MG tablet Take 1 tablet (25 mg total) by mouth every 6 (six) hours as needed for nausea or vomiting. 05/23/19  Yes Suzzanne Cloud, NP  rosuvastatin (CRESTOR) 5 MG tablet Take 1  three times a week 05/06/19  Yes Weaver, Scott T, PA-C  SUMAtriptan (IMITREX) 50 MG tablet Take 1 tablet at onset of migraine. Can take 1 additional tablet in 2 hours once if needed. Do not take more than 2 tablets in 24 hours. 05/23/19  Yes Suzzanne Cloud, NP    ROS:  Out of a complete 14 system review of symptoms, the patient complains only of the following symptoms, and all other reviewed systems are negative.  Headache Palpitations of the heart  Blood pressure (!) 156/70, pulse 77, height 5' (1.524 m), weight 175 lb 8 oz (79.6 kg).  Physical Exam  General: The patient is alert and cooperative at the time of the examination.  The patient is moderately obese.  Skin: No significant peripheral edema is noted.   Neurologic Exam  Mental status: The patient is alert and oriented x 3 at the time of the examination. The patient has apparent normal recent and remote memory, with an apparently normal attention span and concentration ability.   Cranial nerves: Facial symmetry is present. Speech is normal, no aphasia or dysarthria is noted. Extraocular movements are full. Visual fields are full.  Motor: The patient has good strength in all 4 extremities.  Sensory examination: Soft touch sensation is symmetric on the face, arms, and legs.  Coordination: The patient has good  finger-nose-finger and heel-to-shin bilaterally.  Gait and station: The patient has a normal gait. Tandem gait is slightly unsteady. Romberg is negative. No drift is seen.  Reflexes: Deep tendon reflexes are symmetric.   Assessment/Plan:  1.  Migraine headache, classic migraine  2.  History of cerebral aneurysm  The patient will have a follow-up MRA of the head due to the history of cerebral aneurysm.  The patient has had some worsening of her migraine headache frequency secondary to stress.  She is not having actual headache but she is able to abort the headache when the visual aura begins.  We will not add another medication currently, but if the headaches continue we may try low-dose Topamax.  She will follow-up here otherwise in 6 months.  Jill Alexanders MD 12/07/2019 12:31 PM  [facility] [address] [place], [place] [address]  Phone [phone] Fax [phone]

## 2019-12-07 NOTE — Telephone Encounter (Signed)
[employer] medicare order sent to [facility]. No auth they will reach out to the patient to schedule.  

## 2019-12-12 ENCOUNTER — Other Ambulatory Visit: Payer: Self-pay

## 2019-12-12 ENCOUNTER — Ambulatory Visit
Admission: RE | Admit: 2019-12-12 | Discharge: 2019-12-12 | Disposition: A | Discharge status: Home / Self Care | Payer: Medicare Other | Source: Ambulatory Visit | Attending: Neurology | Admitting: Neurology

## 2019-12-12 DIAGNOSIS — I671 Cerebral aneurysm, nonruptured: Secondary | ICD-10-CM

## 2019-12-13 ENCOUNTER — Encounter (HOSPITAL_COMMUNITY): Payer: Self-pay | Admitting: Emergency Medicine

## 2019-12-13 ENCOUNTER — Telehealth: Payer: Self-pay | Admitting: Neurology

## 2019-12-13 ENCOUNTER — Other Ambulatory Visit: Payer: Self-pay

## 2019-12-13 ENCOUNTER — Emergency Department (HOSPITAL_COMMUNITY): Payer: Medicare Other

## 2019-12-13 ENCOUNTER — Emergency Department (HOSPITAL_COMMUNITY)
Admission: EM | Admit: 2019-12-13 | Discharge: 2019-12-14 | Disposition: A | Discharge status: Home / Self Care | Payer: Medicare Other | Attending: Emergency Medicine | Admitting: Emergency Medicine

## 2019-12-13 DIAGNOSIS — Z79899 Other long term (current) drug therapy: Secondary | ICD-10-CM | POA: Insufficient documentation

## 2019-12-13 DIAGNOSIS — R4701 Aphasia: Secondary | ICD-10-CM | POA: Diagnosis not present

## 2019-12-13 DIAGNOSIS — R002 Palpitations: Secondary | ICD-10-CM | POA: Diagnosis not present

## 2019-12-13 DIAGNOSIS — R404 Transient alteration of awareness: Secondary | ICD-10-CM | POA: Diagnosis not present

## 2019-12-13 DIAGNOSIS — N3000 Acute cystitis without hematuria: Secondary | ICD-10-CM

## 2019-12-13 DIAGNOSIS — I10 Essential (primary) hypertension: Secondary | ICD-10-CM | POA: Insufficient documentation

## 2019-12-13 DIAGNOSIS — Z7982 Long term (current) use of aspirin: Secondary | ICD-10-CM | POA: Insufficient documentation

## 2019-12-13 DIAGNOSIS — Z86718 Personal history of other venous thrombosis and embolism: Secondary | ICD-10-CM | POA: Diagnosis not present

## 2019-12-13 DIAGNOSIS — G9389 Other specified disorders of brain: Secondary | ICD-10-CM | POA: Diagnosis not present

## 2019-12-13 DIAGNOSIS — R410 Disorientation, unspecified: Secondary | ICD-10-CM | POA: Diagnosis not present

## 2019-12-13 DIAGNOSIS — G43109 Migraine with aura, not intractable, without status migrainosus: Secondary | ICD-10-CM | POA: Insufficient documentation

## 2019-12-13 DIAGNOSIS — G319 Degenerative disease of nervous system, unspecified: Secondary | ICD-10-CM | POA: Diagnosis not present

## 2019-12-13 DIAGNOSIS — R29818 Other symptoms and signs involving the nervous system: Secondary | ICD-10-CM | POA: Diagnosis not present

## 2019-12-13 DIAGNOSIS — I618 Other nontraumatic intracerebral hemorrhage: Secondary | ICD-10-CM | POA: Diagnosis not present

## 2019-12-13 DIAGNOSIS — R4182 Altered mental status, unspecified: Secondary | ICD-10-CM | POA: Diagnosis present

## 2019-12-13 LAB — URINALYSIS, ROUTINE W REFLEX MICROSCOPIC
Bilirubin Urine: NEGATIVE
Glucose, UA: NEGATIVE mg/dL
Hgb urine dipstick: NEGATIVE
Ketones, ur: 5 mg/dL — AB
Leukocytes,Ua: NEGATIVE
Nitrite: POSITIVE — AB
Protein, ur: NEGATIVE mg/dL
Specific Gravity, Urine: 1.014 (ref 1.005–1.030)
pH: 7 (ref 5.0–8.0)

## 2019-12-13 LAB — I-STAT CHEM 8, ED
BUN: 17 mg/dL (ref 8–23)
Calcium, Ion: 1.12 mmol/L — ABNORMAL LOW (ref 1.15–1.40)
Chloride: 96 mmol/L — ABNORMAL LOW (ref 98–111)
Creatinine, Ser: 0.8 mg/dL (ref 0.44–1.00)
Glucose, Bld: 108 mg/dL — ABNORMAL HIGH (ref 70–99)
HCT: 45 % (ref 36.0–46.0)
Hemoglobin: 15.3 g/dL — ABNORMAL HIGH (ref 12.0–15.0)
Potassium: 3.8 mmol/L (ref 3.5–5.1)
Sodium: 132 mmol/L — ABNORMAL LOW (ref 135–145)
TCO2: 26 mmol/L (ref 22–32)

## 2019-12-13 LAB — DIFFERENTIAL
Abs Immature Granulocytes: 0.04 10*3/uL (ref 0.00–0.07)
Basophils Absolute: 0 10*3/uL (ref 0.0–0.1)
Basophils Relative: 0 %
Eosinophils Absolute: 0 10*3/uL (ref 0.0–0.5)
Eosinophils Relative: 0 %
Immature Granulocytes: 0 %
Lymphocytes Relative: 11 %
Lymphs Abs: 1.2 10*3/uL (ref 0.7–4.0)
Monocytes Absolute: 0.5 10*3/uL (ref 0.1–1.0)
Monocytes Relative: 5 %
Neutro Abs: 8.4 10*3/uL — ABNORMAL HIGH (ref 1.7–7.7)
Neutrophils Relative %: 84 %

## 2019-12-13 LAB — COMPREHENSIVE METABOLIC PANEL
ALT: 11 U/L (ref 0–44)
AST: 19 U/L (ref 15–41)
Albumin: 4.1 g/dL (ref 3.5–5.0)
Alkaline Phosphatase: 52 U/L (ref 38–126)
Anion gap: 12 (ref 5–15)
BUN: 16 mg/dL (ref 8–23)
CO2: 24 mmol/L (ref 22–32)
Calcium: 9.4 mg/dL (ref 8.9–10.3)
Chloride: 96 mmol/L — ABNORMAL LOW (ref 98–111)
Creatinine, Ser: 0.99 mg/dL (ref 0.44–1.00)
GFR calc non Af Amer: 55 mL/min — ABNORMAL LOW (ref >60)
Glucose, Bld: 108 mg/dL — ABNORMAL HIGH (ref 70–99)
Potassium: 3.8 mmol/L (ref 3.5–5.1)
Sodium: 132 mmol/L — ABNORMAL LOW (ref 135–145)
Total Bilirubin: 1.4 mg/dL — ABNORMAL HIGH (ref 0.3–1.2)
Total Protein: 7 g/dL (ref 6.5–8.1)

## 2019-12-13 LAB — CBC
HCT: 44.3 % (ref 36.0–46.0)
Hemoglobin: 14.9 g/dL (ref 12.0–15.0)
MCH: 27.8 pg (ref 26.0–34.0)
MCHC: 33.6 g/dL (ref 30.0–36.0)
MCV: 82.6 fL (ref 80.0–100.0)
Platelets: 307 10*3/uL (ref 150–400)
RBC: 5.36 MIL/uL — ABNORMAL HIGH (ref 3.87–5.11)
RDW: 12.5 % (ref 11.5–15.5)
WBC: 10.2 10*3/uL (ref 4.0–10.5)
nRBC: 0 % (ref 0.0–0.2)

## 2019-12-13 LAB — CBG MONITORING, ED: Glucose-Capillary: 108 mg/dL — ABNORMAL HIGH (ref 70–99)

## 2019-12-13 LAB — PROTIME-INR
INR: 0.9 (ref 0.8–1.2)
Prothrombin Time: 12.1 seconds (ref 11.4–15.2)

## 2019-12-13 LAB — APTT: aPTT: 30 seconds (ref 24–36)

## 2019-12-13 MED ORDER — SODIUM CHLORIDE 0.9% FLUSH: 3.0000 mL | Freq: Once | INTRAVENOUS | Status: DC

## 2019-12-13 MED ORDER — CEPHALEXIN 500 MG PO CAPS: 500.0000 mg | ORAL_CAPSULE | Freq: Four times a day (QID) | ORAL | 0 refills | Status: DC

## 2019-12-13 MED ORDER — KETOROLAC TROMETHAMINE 30 MG/ML IJ SOLN
15.0000 mg | Freq: Once | INTRAMUSCULAR | Status: AC
  Administered 2019-12-13: 15 mg via INTRAVENOUS
  Filled 2019-12-13: qty 1

## 2019-12-13 MED ORDER — PROCHLORPERAZINE EDISYLATE 10 MG/2ML IJ SOLN
10.0000 mg | Freq: Once | INTRAMUSCULAR | Status: AC
  Administered 2019-12-13: 10 mg via INTRAVENOUS
  Filled 2019-12-13: qty 2

## 2019-12-13 MED ORDER — TOPIRAMATE 25 MG PO TABS: ORAL_TABLET | ORAL | 3 refills | Status: DC

## 2019-12-13 MED ORDER — SODIUM CHLORIDE 0.9 % IV BOLUS
500.0000 mL | Freq: Once | INTRAVENOUS | Status: AC
  Administered 2019-12-13: 500 mL via INTRAVENOUS

## 2019-12-13 NOTE — ED Notes (Signed)
Spoke with pt's grandson about plan of care, per grandson pt can become confused when she is taking her home migraine medications.   Erlene Quan (grandson)- [phone] Aaron Edelman (son)- [phone]

## 2019-12-13 NOTE — ED Provider Notes (Signed)
[facility] EMERGENCY DEPARTMENT Provider Note   CSN: [id number] Arrival date & time: 12/13/19  1512     History Chief Complaint  Patient presents with   Headache   Altered Mental Status    [name] is a 79 y.o. female.  HPI Level 5 caveat due to difficulty speaking. Patient presents with headache and difficulty speaking.  History of migraines states this feels the same.  However is having some difficulty speaking some confusion.  History of migraines and as previously had difficulty speaking with migraines.  States this is not improved with the Phenergan and Topamax that she took.  Began around 10 AM this morning.  Patient did have an MRI of the head yesterday.  Patient can answer yes or no questions.  Will follow commands but when speaking for longer by the time the wrong words appear to be coming out.    Past Medical History:  Diagnosis Date   Allergy    Arthritis    "got alot; especially in my spine"   Chest pain 02/2016   Chronic back pain    "all of it"   Classical migraine with intractable migraine 02/25/2016   Diverticulosis of colon (without mention of hemorrhage)    DVT of leg (deep venous thrombosis) ([place]) 09/2011   LLE   Dysrhythmia    notes in epic   Esophageal reflux    Family history of malignant neoplasm of gastrointestinal tract    Fibromyalgia    "dx'd w/it when it first came out"   Hiatal hernia    Hyperlipidemia    Hypertension    Migraines 09/24/11   "often recently;  more controlled now"   Obesity, unspecified    Osteoporosis    Palpitations    event monitor 8/13: NSR, Sinus brady, Sinus Tachy, PAC   Personal history of colonic polyps 05/31/2007   adenomatous polyp   Pneumonia    "3 times at least" (09/24/11)   Rheumatoid arthritis(714.0)    Scoliosis    Stricture and stenosis of esophagus    Stroke ([place]) 08/2016   Unspecified gastritis and gastroduodenitis without mention of hemorrhage      Patient Active Problem List   Diagnosis Date Noted   Pain in both lower extremities 01/25/2018   Newly diagnosed diabetes ([place]) 12/25/2017   Esophageal dysphagia    Family history of colon cancer    Benign neoplasm of descending colon    Classical migraine with intractable migraine 02/25/2016   Chest pain 02/13/2016   Leg pain 01/28/2013   Autonomic orthostatic hypotension 11/25/2011   Nausea 09/25/2011   Tachycardia, paroxysmal ([place]) 09/24/2011   DVT (deep venous thrombosis) ([place]) 09/24/2011   Hyponatremia 09/24/2011   Essential hypertension 05/13/2011   Dyspnea on exertion 04/23/2011   Palpitations 04/23/2011   Hyperlipidemia 04/23/2011   Hx of colonic polyps 02/06/2011   Family history of malignant neoplasm of gastrointestinal tract 02/06/2011   Status post partial resection of colon 02/06/2011   Obesity 02/06/2011   EXOGENOUS OBESITY 07/01/2007   GERD 07/01/2007   Acute intractable headache 07/01/2007   Hx of adenomatous polyp of colon 06/02/2007   GASTRITIS 05/31/2007   HIATAL HERNIA 05/31/2007   DIVERTICULOSIS, COLON 05/31/2007   Esophageal ring, acquired 02/18/2002    Past Surgical History:  Procedure Laterality Date   BACK SURGERY     BREAST CYST INCISION AND DRAINAGE  2012   left; "golf-ball sized"   BREAST SURGERY     cyst lanced and drainded  COLON SURGERY     COLONOSCOPY  2009   COLONOSCOPY WITH PROPOFOL N/A 03/16/2017   Procedure: COLONOSCOPY WITH PROPOFOL;  Surgeon: Gatha Mayer, MD;  Location: [facility] ENDOSCOPY;  Service: Endoscopy;  Laterality: N/A;   ESOPHAGEAL DILATION     "several times"   ESOPHAGOGASTRODUODENOSCOPY (EGD) WITH PROPOFOL N/A 03/16/2017   Procedure: ESOPHAGOGASTRODUODENOSCOPY (EGD) WITH PROPOFOL;  Surgeon: Gatha Mayer, MD;  Location: [facility] ENDOSCOPY;  Service: Endoscopy;  Laterality: N/A;   EXPLORATORY LAPAROTOMY  04/2000   "for ruptured colon"   HEMATOMA EVACUATION  09/2011   LLE   LUMBAR  LAMINECTOMY/DECOMPRESSION MICRODISCECTOMY  09/03/2011   Procedure: LUMBAR LAMINECTOMY/DECOMPRESSION MICRODISCECTOMY 2 LEVELS;  Surgeon: Elaina Hoops, MD;  Location: [place] [facility];  Service: Neurosurgery;  Laterality: Left;  Left Lumbar Three-Four, Lumbar Four-Five Decompressive Laminectomy   SALIVARY GLAND SURGERY  ~ 1980   1 removed   SHOULDER SURGERY  2008   right shoulder and collar bone   SIGMOID RESECTION / RECTOPEXY  09/1999   Dr Emiliano Dyer FINGER RELEASE  2004   left thumb   TUMOR REMOVAL  1960's   left leg   UPPER GASTROINTESTINAL ENDOSCOPY       OB History   No obstetric history on file.     Family History  Problem Relation Age of Onset   Stroke Mother    Hypertension Mother    Stroke Father    Hypertension Father    Diabetes Father    Pancreatic cancer Sister    Diabetes Sister        x 2   Colon cancer Sister 85   Pancreatic cancer Sister    Heart attack Neg Hx    Stomach cancer Neg Hx     Social History   Tobacco Use   Smoking status: Never Smoker   Smokeless tobacco: Never Used  Vaping Use   Vaping Use: Never used  Substance Use Topics   Alcohol use: No   Drug use: No    Home Medications Prior to Admission medications   Medication Sig Start Date End Date Taking? Authorizing Provider  acetaminophen (TYLENOL) 500 MG tablet Take 500 mg by mouth daily as needed for moderate pain.    [provider]  albuterol (PROVENTIL HFA;VENTOLIN HFA) 108 (90 Base) MCG/ACT inhaler Inhale 2 puffs into the lungs every 6 (six) hours as needed for wheezing or shortness of breath.    [provider]  aspirin EC 81 MG tablet Take 81 mg by mouth daily.     [provider]  calcium carbonate (TUMS EX) 750 MG chewable tablet Chew 2 tablets by mouth daily as needed for heartburn.     [provider]  cephALEXin (KEFLEX) 500 MG capsule Take 1 capsule (500 mg total) by mouth 4 (four) times daily. 12/13/19   Davonna Belling, MD  Cyanocobalamin (VITAMIN B-12 IJ) Inject as directed every 30 (thirty) days.    [provider]  Dextran 70-Hypromellose ([employer] NATURAL TEARS OP) Apply 1-2 drops to eye daily as needed (dry eyes).    [provider]  fluticasone (FLONASE) 50 MCG/ACT nasal spray Place 1 spray into both nostrils daily as needed for allergies. 08/02/16   [provider]  hydrochlorothiazide (HYDRODIURIL) 25 MG tablet Take 1 tablet (25 mg total) by mouth daily. 11/25/18   Richardson Dopp T, PA-C  lisinopril (ZESTRIL) 20 MG tablet Take 1 tablet (20 mg total) by mouth daily. 05/06/19 05/05/20  Richardson Dopp T, PA-C  loratadine (CLARITIN) 10 MG tablet Take 10 mg by mouth daily as needed for allergies.    [provider]  metoprolol tartrate (LOPRESSOR) 25 MG tablet Take 1 tablet (25 mg total) by mouth 2 (two) times daily. 09/15/19   Sherren Mocha, MD  neomycin-bacitracin-polymyxin (NEOSPORIN) ointment Apply 1 application topically as needed for wound care.    [provider]  pantoprazole (PROTONIX) 40 MG tablet TAKE 1 TABLET(40 MG) BY MOUTH TWICE DAILY BEFORE A MEAL 05/02/19   Gatha Mayer, MD  polyethylene glycol [facility] / GLYCOLAX) packet Take 17 g by mouth daily as needed for mild constipation.    [provider]  promethazine (PHENERGAN) 25 MG tablet Take 1 tablet (25 mg total) by mouth every 6 (six) hours as needed for nausea or vomiting. 05/23/19   Suzzanne Cloud, NP  rosuvastatin (CRESTOR) 5 MG tablet Take 1 three times a week 05/06/19   Richardson Dopp T, PA-C  SUMAtriptan (IMITREX) 50 MG tablet Take 1 tablet at onset of migraine. Can take 1 additional tablet in 2 hours once if needed. Do not take more than 2 tablets in 24 hours. 05/23/19   Suzzanne Cloud, NP  topiramate (TOPAMAX) 25 MG tablet Take one tablet at night for one week, then take 2 tablets at night 12/13/19   Kathrynn Ducking, MD    Allergies    Reglan [metoclopramide]  Review of Systems     Review of Systems  Unable to perform ROS: Mental status change    Physical Exam Updated Vital Signs BP (!) 120/46    Pulse 69    Temp 97.8 F (36.6 C) (Oral)    Resp (!) 22    LMP  (LMP Unknown)    SpO2 98%   Physical Exam Vitals and nursing note reviewed.  HENT:     Head: Atraumatic.  Eyes:     Extraocular Movements: Extraocular movements intact.     Pupils: Pupils are equal, round, and reactive to light.  Cardiovascular:     Rate and Rhythm: Regular rhythm.  Abdominal:     Palpations: Abdomen is soft.  Musculoskeletal:     Cervical back: Normal range of motion.  Skin:    General: Skin is warm.  Neurological:     Mental Status: She is alert.     Comments: Awake and moves all extremities.  Answers questions but when attempts to speak for longer time the wrong words in the coming out.  She does not appear to notice that the wrong words are coming out.  Not photophobic.     ED Results / Procedures / Treatments   Labs (all labs ordered are listed, but only abnormal results are displayed) Labs Reviewed  CBC - Abnormal; Notable for the following components:      Result Value   RBC 5.36 (*)    All other components within normal limits  DIFFERENTIAL - Abnormal; Notable for the following components:   Neutro Abs 8.4 (*)    All other components within normal limits  COMPREHENSIVE METABOLIC PANEL - Abnormal; Notable for the following components:   Sodium 132 (*)    Chloride 96 (*)    Glucose, Bld 108 (*)    Total Bilirubin 1.4 (*)    GFR calc non Af Amer 55 (*)    All other components within normal limits  URINALYSIS, ROUTINE W REFLEX MICROSCOPIC - Abnormal; Notable for the following components:   APPearance HAZY (*)    Ketones, ur 5 (*)  Nitrite POSITIVE (*)    Bacteria, UA MANY (*)    All other components within normal limits  I-STAT CHEM 8, ED - Abnormal; Notable for the following components:   Sodium 132 (*)    Chloride 96 (*)    Glucose, Bld 108 (*)     Calcium, Ion 1.12 (*)    Hemoglobin 15.3 (*)    All other components within normal limits  CBG MONITORING, ED - Abnormal; Notable for the following components:   Glucose-Capillary 108 (*)    All other components within normal limits  URINE CULTURE  PROTIME-INR  APTT    EKG None  Radiology CT HEAD WO CONTRAST  Result Date: 12/13/2019 CLINICAL DATA:  Altered mental status, stroke suspected. headache/migraine, nausea, and vomiting that acutely started at 10am this morning. Pt states that her speech is off since 10am EXAM: CT HEAD WITHOUT CONTRAST TECHNIQUE: Contiguous axial images were obtained from the base of the skull through the vertex without intravenous contrast. COMPARISON:  MR head 08/20/2016, CT head 08/18/2016 FINDINGS: Brain: Patchy and confluent areas of decreased attenuation are noted throughout the deep and periventricular white matter of the cerebral hemispheres bilaterally, compatible with chronic microvascular ischemic disease. No evidence of large-territorial acute infarction. No parenchymal hemorrhage. No mass lesion. No extra-axial collection. No mass effect or midline shift. No hydrocephalus. Basilar cisterns are patent. Vascular: No hyperdense vessel. Skull: No acute fracture or focal lesion. Sinuses/Orbits: Paranasal sinuses and mastoid air cells are clear. Right lens replacement. Otherwise orbits are unremarkable. Other: None. IMPRESSION: No acute intracranial abnormality. Electronically Signed   By: Iven Finn M.D.   On: 12/13/2019 18:18   MR ANGIO HEAD WO CONTRAST  Result Date: 12/12/2019  [facility] [address], [place], [place] [address] [phone] NEUROIMAGING REPORT STUDY DATE: 12/12/2019 PATIENT NAME: SENIE LANESE DOB: 1940-03-17 MRN: [id number] EXAM: MR angiogram of the intracranial arteries ORDERING CLINICIAN: Kathrynn Ducking, MD CLINICAL HISTORY: 79 year old woman with unruptured cerebral aneurysm COMPARISON FILMS: MR angiogram  08/20/2016 TECHNIQUE: MR angiogram of the head was obtained utilizing 3D time of flight sequences from below the vertebrobasilar junction up to the intracranial vasculature without contrast.  Computerized reconstructions were obtained. CONTRAST: None IMAGING SITE: [facility], [address] [place], [place], [place] FINDINGS: There is mild to moderate stenosis of the clinoid segments of the internal carotid arteries.  There is a small focus of severe stenosis or artifact in the ophthalmic segment of the right internal carotid artery.  This was not noted on the previous MRA.  An outpouching of the right internal carotid artery seen on a previous MR angiogram is not clearly noted on the current study.  There is occlusion of the A1 segment of the right anterior cerebral artery, not noted on the 2018 MR angiogram with a segment appeared normal.  The more distal anterior cerebral arteries and the. middle cerebral arteries appear normal. In the posterior circulation, the vertebral arteries are co-dominant. No stenosis is noted within the vertebral arteries and the basilar arteries.  There is mild stenosis of the proximal P1 segment of the left posterior cerebral artery and tandem severe foci of stenosis in the P2 segments of bilateral posterior cerebral arteries, similar to what was seen in 2018.   This MR angiogram of the intracranial arteries shows the following: 1.   Mild to moderate stenosis of the clinoid segments of the internal carotid arteries, similar or mildly progressed compared to the 2018 MRI angiogram.  There was a  possible focus of severe stenosis in the ophthalmic segment of the right internal carotid artery not noted on the 2018 MR angiogram.  This could also represent artifact. 2.   Occlusion of the A1 segment of the right anterior cerebral artery.  This has occurred since the previous MR angiogram. 3.   Severe stenosis of the P2 segments of both posterior cerebral arteries and milder stenosis of the  P1 segment of the left posterior cerebral artery, unchanged compared to the 2018 MR angiogram 4.   A possible small aneurysm noted on the 2018 MR angiogram is not clearly seen on the current study. INTERPRETING PHYSICIAN: Richard A. Felecia Shelling, MD, PhD, FAAN Certified in  Neuroimaging by [employer]   MR BRAIN WO CONTRAST  Result Date: 12/13/2019 CLINICAL DATA:  Initial evaluation for neuro deficit, stroke suspected. EXAM: MRI HEAD WITHOUT CONTRAST TECHNIQUE: Multiplanar, multiecho pulse sequences of the brain and surrounding structures were obtained without intravenous contrast. COMPARISON:  Prior CT from earlier the same day as well as previous MRI from 08/20/2016 FINDINGS: Brain: Generalized age appropriate cerebral atrophy. Patchy T2/FLAIR hyperintensity involving the periventricular deep white matter both cerebral hemispheres as well as the pons, most consistent with chronic small vessel ischemic disease, mild to moderate in nature, and mildly progressed as compared to 2018. No abnormal foci of restricted diffusion to suggest acute or subacute ischemia. Gray-white matter differentiation maintained. No encephalomalacia to suggest chronic cortical infarction. No evidence for acute intracranial hemorrhage. Single punctate chronic microhemorrhage noted within the left parietal lobe, of doubtful significance in isolation. No other evidence for chronic intracranial hemorrhage. No mass lesion, midline shift or mass effect. No hydrocephalus or extra-axial fluid collection. Pituitary gland suprasellar region normal. Midline structures intact. Vascular: Major intracranial vascular flow voids are maintained. Skull and upper cervical spine: Craniocervical junction within normal limits. Bone marrow signal intensity normal. Hyperostosis frontalis interna noted. No scalp soft tissue abnormality. Sinuses/Orbits: Patient status post ocular lens replacement on the right. Globes and orbital soft tissues  demonstrate no acute finding. Paranasal sinuses are largely clear. No mastoid effusion. Inner ear structures grossly normal. Other: None. IMPRESSION: 1. No acute intracranial abnormality. 2. Age-related cerebral atrophy with mild to moderate chronic microvascular ischemic disease, mildly progressed as compared to 2018. Electronically Signed   By: Jeannine Boga M.D.   On: 12/13/2019 23:01    Procedures Procedures (including critical care time)  Medications Ordered in ED Medications  sodium chloride flush (NS) 0.9 % injection 3 mL (has no administration in time range)  prochlorperazine (COMPAZINE) injection 10 mg (10 mg Intravenous Given 12/13/19 1926)  ketorolac (TORADOL) 30 MG/ML injection 15 mg (15 mg Intravenous Given 12/13/19 1926)  sodium chloride 0.9 % bolus 500 mL (0 mLs Intravenous Stopped 12/13/19 2006)    ED Course  I have reviewed the triage vital signs and the nursing notes.  Pertinent labs & imaging results that were available during my care of the patient were reviewed by me and considered in my medical decision making (see chart for details).    MDM Rules/Calculators/A&P                         Patient presents with headache and difficulty speaking.  Has had a history of complicated migraines and initially consider that was likely the cause of the difficulty speaking.  However has had some issues in the past with medications giving her confusion.  Patient's headache improved after treatment, however still some  difficulty speaking.  MRI done and reassuring.  Headache improved so the cause easily could be medications.  Although urine does show potential infection 2.  Will treat with antibiotics and culture sent.  Patient feeling better.  Still mildly cloudy but I think stable for discharge.  Discharge home with outpatient follow-up PCP and neurology as needed. Final Clinical Impression(s) / ED Diagnoses Final diagnoses:  Complicated migraine  Acute cystitis without hematuria     Rx / DC Orders ED Discharge Orders         Ordered    cephALEXin (KEFLEX) 500 MG capsule  4 times daily        12/13/19 2330           Davonna Belling, MD 12/13/19 2332

## 2019-12-13 NOTE — Telephone Encounter (Signed)
Pt called stating that she is having a horrible migraine at this moment and has taken medication already for it and nothing is alleviating the pain. Pt is wanting to know if she can be advised on what she can do for the pain or if she is needing to be seen today. Pt also would like to know if her MRI results have come in.

## 2019-12-13 NOTE — ED Notes (Signed)
Pt more alert at this time, answering questions appropriately, still having some difficulty finding correct words.

## 2019-12-13 NOTE — ED Triage Notes (Signed)
Patient arrives to ED triage via [facility] with complaints of a headache/migraine, nausea, and vomiting that acutely started at 10am this morning. Pt reached out to her MD who told her to take her Phenergan, Topamax and try to sleep. Pt states that her speech is off since 10am and she feels like her mind is "cloudy." Pt had MRA of head yesterday. Pt states hx of migraine which can cause her speech to be off and to be confused at times.

## 2019-12-13 NOTE — Telephone Encounter (Signed)
I called the patient.  MRI of the head does not clearly identify a cerebral aneurysm, she does have multilevel atherosclerotic changes that has progressed some since 2018.  The patient is having a migraine currently, she is having a lot of nausea with this and some cognitive clouding.  She is to take her Phenergan and try to sleep.  I will go ahead and initiate Topamax for her headache.  The headaches are continuing fairly frequently.   MRA head 12/12/19:  IMPRESSION: This MR angiogram of the intracranial arteries shows the following: 1.   Mild to moderate stenosis of the clinoid segments of the internal carotid arteries, similar or mildly progressed compared to the 2018 MRI angiogram.  There was a possible focus of severe stenosis in the ophthalmic segment of the right internal carotid artery not noted on the 2018 MR angiogram.  This could also represent artifact. 2.   Occlusion of the A1 segment of the right anterior cerebral artery.  This has occurred since the previous MR angiogram. 3.   Severe stenosis of the P2 segments of both posterior cerebral arteries and milder stenosis of the P1 segment of the left posterior cerebral artery, unchanged compared to the 2018 MR angiogram 4.   A possible small aneurysm noted on the 2018 MR angiogram is not clearly seen on the current study.

## 2019-12-13 NOTE — Telephone Encounter (Signed)
Please see Dr. Jannifer Franklin phone note from 12/13/19 @ 1244 pm.  Patient was called and questions answered by Dr. Jannifer Franklin.

## 2019-12-14 ENCOUNTER — Other Ambulatory Visit: Payer: Self-pay | Admitting: Cardiovascular Disease

## 2019-12-14 ENCOUNTER — Other Ambulatory Visit: Payer: Self-pay | Admitting: Physician Assistant

## 2019-12-14 NOTE — ED Notes (Signed)
Spoke with son on phone, reports that pt grandson will be picking pt up by 23am

## 2019-12-14 NOTE — ED Notes (Signed)
Attempted to call pt's son and grandson using numbers provided, no answer. Pt unable to locate her personal cell phone. Charge RN aware. Will attempt to call family again.

## 2019-12-18 LAB — URINE CULTURE: Culture: >=100000 — AB

## 2019-12-19 ENCOUNTER — Telehealth: Payer: Self-pay | Admitting: *Deleted

## 2019-12-19 NOTE — Telephone Encounter (Signed)
Post ED Visit - Positive Culture Follow-up  Culture report reviewed by antimicrobial stewardship pharmacist: [place] Team []  Elenor Quinones, Pharm.D. []  Heide Guile, Pharm.D., BCPS AQ-ID []  Parks Neptune, Pharm.D., BCPS []  Alycia Rossetti, Pharm.D., BCPS []  [place], Pharm.D., BCPS, AAHIVP []  Legrand Como, Pharm.D., BCPS, AAHIVP []  Salome Arnt, PharmD, BCPS []  Johnnette Gourd, PharmD, BCPS []  Hughes Better, PharmD, BCPS []  Leeroy Cha, PharmD []  Laqueta Linden, PharmD, BCPS [x]  Albertina Parr, PharmD  [place] Team []  Leodis Sias, PharmD []  Lindell Spar, PharmD []  Royetta Asal, PharmD []  Graylin Shiver, Rph []  Rema Fendt) Glennon Mac, PharmD []  Arlyn Dunning, PharmD []  Netta Cedars, PharmD []  Dia Sitter, PharmD []  Leone Haven, PharmD []  Gretta Arab, PharmD []  Theodis Shove, PharmD []  Peggyann Juba, PharmD []  Reuel Boom, PharmD   Positive urine culture Treated with Cephalexin, organism sensitive to the same and no further patient follow-up is required at this time.  Harlon Flor [facility] 12/19/2019, 11:32 AM

## 2019-12-22 NOTE — Telephone Encounter (Signed)
Patient called back and stated she didn't understand what Dr. Jannifer Franklin told her because she was having a migraine.  The medicine he sent her (topamax) has helped resolve her migraine, she is feeling much better.  Patient denied any questions and expressed appreciation.

## 2019-12-30 DIAGNOSIS — R21 Rash and other nonspecific skin eruption: Secondary | ICD-10-CM | POA: Diagnosis not present

## 2020-01-03 DIAGNOSIS — R102 Pelvic and perineal pain: Secondary | ICD-10-CM | POA: Diagnosis not present

## 2020-01-09 ENCOUNTER — Encounter: Payer: Self-pay | Admitting: Physician Assistant

## 2020-01-10 ENCOUNTER — Other Ambulatory Visit: Payer: Self-pay | Admitting: Cardiovascular Disease

## 2020-01-23 DIAGNOSIS — E538 Deficiency of other specified B group vitamins: Secondary | ICD-10-CM | POA: Diagnosis not present

## 2020-02-01 ENCOUNTER — Ambulatory Visit (INDEPENDENT_AMBULATORY_CARE_PROVIDER_SITE_OTHER): Payer: Medicare Other | Admitting: Neurology

## 2020-02-01 ENCOUNTER — Encounter: Payer: Self-pay | Admitting: Neurology

## 2020-02-01 ENCOUNTER — Telehealth: Payer: Self-pay | Admitting: Cardiovascular Disease

## 2020-02-01 VITALS — BP 195/90 | HR 76 | Ht 60.0 in | Wt 170.0 lb

## 2020-02-01 DIAGNOSIS — G43119 Migraine with aura, intractable, without status migrainosus: Secondary | ICD-10-CM | POA: Diagnosis not present

## 2020-02-01 MED ORDER — UBRELVY 50 MG PO TABS: 50.0000 mg | ORAL_TABLET | Freq: Two times a day (BID) | ORAL | 3 refills | Status: DC | PRN

## 2020-02-01 NOTE — Progress Notes (Signed)
Reason for visit: Migraine headache  [name] is an 79 y.o. female  History of present illness:  [name] is a 79 year old right-handed white female with a history of migraine headache.  The patient usually has very few headaches, but recently she has been under stress after she lost her husband.  She was placed on Topamax, she is taking 50 mg at night.  She went to the emergency room on 13 December 2019 with a bad headache.  The patient really has not had any significant problems with headaches since that time.  She has Imitrex to take for her headaches if needed, but she does have a history of hypertension and she has some degree of small vessel disease by MRI of the brain.  The patient is reporting some intermittent tingling of the left hand, this may occur at nighttime, or while driving.  She returns here for further evaluation.  Past Medical History:  Diagnosis Date  . Allergy   . Arthritis    "got alot; especially in my spine"  . Chest pain 02/2016  . Chronic back pain    "all of it"  . Classical migraine with intractable migraine 02/25/2016  . Diverticulosis of colon (without mention of hemorrhage)   . DVT of leg (deep venous thrombosis) ([place]) 09/2011   LLE  . Dysrhythmia    notes in epic  . Esophageal reflux   . Family history of malignant neoplasm of gastrointestinal tract   . Fibromyalgia    "dx'd w/it when it first came out"  . Hiatal hernia   . Hyperlipidemia   . Hypertension   . Migraines 09/24/11   "often recently;  more controlled now"  . Obesity, unspecified   . Osteoporosis   . Palpitations    event monitor 8/13: NSR, Sinus brady, Sinus Tachy, PAC // Event monitor 10/21: NSR, avg HR 63, no AFib/Flutter; no significant arrhythmia  . Personal history of colonic polyps 05/31/2007   adenomatous polyp  . Pneumonia    "3 times at least" (09/24/11)  . Rheumatoid arthritis(714.0)   . Scoliosis   . Stricture and stenosis of esophagus   . Stroke ([place])  08/2016  . Unspecified gastritis and gastroduodenitis without mention of hemorrhage     Past Surgical History:  Procedure Laterality Date  . BACK SURGERY    . BREAST CYST INCISION AND DRAINAGE  2012   left; "golf-ball sized"  . BREAST SURGERY     cyst lanced and drainded  . COLON SURGERY    . COLONOSCOPY  2009  . COLONOSCOPY WITH PROPOFOL N/A 03/16/2017   Procedure: COLONOSCOPY WITH PROPOFOL;  Surgeon: Gatha Mayer, MD;  Location: [facility];  Service: Endoscopy;  Laterality: N/A;  . ESOPHAGEAL DILATION     "several times"  . ESOPHAGOGASTRODUODENOSCOPY (EGD) WITH PROPOFOL N/A 03/16/2017   Procedure: ESOPHAGOGASTRODUODENOSCOPY (EGD) WITH PROPOFOL;  Surgeon: Gatha Mayer, MD;  Location: [facility];  Service: Endoscopy;  Laterality: N/A;  . EXPLORATORY LAPAROTOMY  04/2000   "for ruptured colon"  . HEMATOMA EVACUATION  09/2011   LLE  . LUMBAR LAMINECTOMY/DECOMPRESSION MICRODISCECTOMY  09/03/2011   Procedure: LUMBAR LAMINECTOMY/DECOMPRESSION MICRODISCECTOMY 2 LEVELS;  Surgeon: Elaina Hoops, MD;  Location: [facility];  Service: Neurosurgery;  Laterality: Left;  Left Lumbar Three-Four, Lumbar Four-Five Decompressive Laminectomy  . SALIVARY GLAND SURGERY  ~ 1980   1 removed  . SHOULDER SURGERY  2008   right shoulder and collar bone  . SIGMOID RESECTION / RECTOPEXY  09/1999   Dr Rosana Hoes  . TRIGGER FINGER RELEASE  2004   left thumb  . TUMOR REMOVAL  1960's   left leg  . UPPER GASTROINTESTINAL ENDOSCOPY      Family History  Problem Relation Age of Onset  . Stroke Mother   . Hypertension Mother   . Stroke Father   . Hypertension Father   . Diabetes Father   . Pancreatic cancer Sister   . Diabetes Sister        x 2  . Colon cancer Sister 24  . Pancreatic cancer Sister   . Heart attack Neg Hx   . Stomach cancer Neg Hx     Social history:  reports that she has never smoked. She has never used smokeless tobacco. She reports that she does not drink alcohol and does not use  drugs.    Allergies  Allergen Reactions  . Reglan [Metoclopramide] Other (See Comments)    Speech is garbled, extreme trouble making words    Medications:  Prior to Admission medications   Medication Sig Start Date End Date Taking? Authorizing Provider  acetaminophen (TYLENOL) 500 MG tablet Take 500 mg by mouth daily as needed for moderate pain.   Yes [provider]  albuterol (PROVENTIL HFA;VENTOLIN HFA) 108 (90 Base) MCG/ACT inhaler Inhale 2 puffs into the lungs every 6 (six) hours as needed for wheezing or shortness of breath.   Yes [provider]  aspirin EC 81 MG tablet Take 81 mg by mouth daily.    Yes [provider]  calcium carbonate (TUMS EX) 750 MG chewable tablet Chew 2 tablets by mouth daily as needed for heartburn.    Yes [provider]  cephALEXin (KEFLEX) 500 MG capsule Take 1 capsule (500 mg total) by mouth 4 (four) times daily. 12/13/19  Yes Davonna Belling, MD  Cyanocobalamin (VITAMIN B-12 IJ) Inject as directed every 30 (thirty) days.   Yes [provider]  Dextran 70-Hypromellose ([employer] NATURAL TEARS OP) Apply 1-2 drops to eye daily as needed (dry eyes).   Yes [provider]  fluticasone (FLONASE) 50 MCG/ACT nasal spray Place 1 spray into both nostrils daily as needed for allergies. 08/02/16  Yes [provider]  hydrochlorothiazide (HYDRODIURIL) 25 MG tablet TAKE 1 TABLET(25 MG) BY MOUTH DAILY 12/14/19  Yes Sherren Mocha, MD  lisinopril (ZESTRIL) 20 MG tablet Take 1 tablet (20 mg total) by mouth daily. 05/06/19 05/05/20 Yes Weaver, Scott T, PA-C  loratadine (CLARITIN) 10 MG tablet Take 10 mg by mouth daily as needed for allergies.   Yes [provider]  metoprolol tartrate (LOPRESSOR) 25 MG tablet TAKE 1 TABLET(25 MG) BY MOUTH TWICE DAILY 01/10/20  Yes Sherren Mocha, MD  neomycin-bacitracin-polymyxin (NEOSPORIN) ointment Apply 1 application topically as needed for wound care.   Yes [provider]  pantoprazole (PROTONIX) 40 MG tablet TAKE 1 TABLET(40 MG) BY MOUTH TWICE DAILY BEFORE A MEAL 05/02/19  Yes Gatha Mayer, MD  polyethylene glycol (MIRALAX / GLYCOLAX) packet Take 17 g by mouth daily as needed for mild constipation.   Yes [provider]  promethazine (PHENERGAN) 25 MG tablet Take 1 tablet (25 mg total) by mouth every 6 (six) hours as needed for nausea or vomiting. 05/23/19  Yes Suzzanne Cloud, NP  rosuvastatin (CRESTOR) 5 MG tablet Take 1 three times a week 05/06/19  Yes Weaver, Scott T, PA-C  SUMAtriptan (IMITREX) 50 MG tablet Take 1 tablet at onset of migraine. Can take 1 additional  tablet in 2 hours once if needed. Do not take more than 2 tablets in 24 hours. 05/23/19  Yes Suzzanne Cloud, NP  topiramate (TOPAMAX) 25 MG tablet Take one tablet at night for one week, then take 2 tablets at night Patient taking differently: Take 50 mg by mouth at bedtime.  12/13/19  Yes Kathrynn Ducking, MD    ROS:  Out of a complete 14 system review of symptoms, the patient complains only of the following symptoms, and all other reviewed systems are negative.  Right hand tingling Headache Walking difficulty  Blood pressure (!) 195/90, pulse 76, height 5' (1.524 m), weight 170 lb (77.1 kg).   Repeat blood pressure, right arm, sitting is 148/80.  Physical Exam  General: The patient is alert and cooperative at the time of the examination.  The patient is markedly obese.  Skin: No significant peripheral edema is noted.   Neurologic Exam  Mental status: The patient is alert and oriented x 3 at the time of the examination. The patient has apparent normal recent and remote memory, with an apparently normal attention span and concentration ability.   Cranial nerves: Facial symmetry is present. Speech is normal, no aphasia or dysarthria is noted. Extraocular movements are full. Visual fields are full.  Motor: The patient has good strength in all 4  extremities.  Sensory examination: Soft touch sensation is symmetric on the face, arms, and legs.  Coordination: The patient has good finger-nose-finger and heel-to-shin bilaterally.  Gait and station: The patient has a slightly wide-based gait, slightly unsteady.  Tandem gait is slightly unsteady.  Romberg is negative.  Reflexes: Deep tendon reflexes are symmetric.   Assessment/Plan:  1.  Migraine headache  The patient has done fairly well with the use of Topamax, she will continue the 50 mg dosing at night.  The patient is not a good candidate for the use of Imitrex with a history of small vessel disease and hypertension.  We will stop Imitrex and use Ubrelvy 50 mg to take if needed for the headache.  The patient will follow up here in 6 months.  If the headaches continue to do well, we may be able to stop the Topamax at that time.  Jill Alexanders MD 02/01/2020 7:39 AM  [facility] [address] [place], [place] [address]  Phone [phone] Fax [phone]

## 2020-02-01 NOTE — Telephone Encounter (Signed)
Patient states she was told to f/u with Dr. Burt Knack after her heart monitor. She states that she has followed up with Richardson Dopp since Dr. Saunders Revel moved to [facility] and has yet to see Dr. Burt Knack. She is scheduled for 06/08/20 with Dr. Burt Knack but wants to know if she needs to be seen sooner. She states she was told to follow up with him as soon as she was done wearing the heart monitor.

## 2020-02-06 ENCOUNTER — Telehealth: Payer: Self-pay | Admitting: Neurology

## 2020-02-06 NOTE — Telephone Encounter (Signed)
Pt is asking for a call to discuss the Ubrogepant (UBRELVY) 50 MG TABS.  Pt has been informed the cost is over $1000.00 and told that her insurance will not over the cost, please call pt to discuss

## 2020-02-06 NOTE — Telephone Encounter (Signed)
I have called the patient back and advised that a PA was initiated for the medication to help with the cost. Pt states that she is concerned about taking the ubrelvy after reading and reviewing the side effects of the medication. Pt just is concerned about taking this medication.  I advised the patient that in reviewing the notes it appears that This medication is a better option for the pt given her hx of small vessel dx and HTN. I advised the patient that it can take up to 3 days before insurance will reply. I advised that as soon as we know then we can contact her and advise. If approved we will send the information to the pharmacy. If denied then we will look to see if she can use a copay card, if not then at that point we will talk with Dr Jannifer Franklin about an alternative option. Pt verbalized understanding.

## 2020-02-06 NOTE — Telephone Encounter (Signed)
I have completed PA for the patient for the ubrelvy to see if they will cover for the patient.

## 2020-02-07 NOTE — Telephone Encounter (Signed)
The patient is scheduled to see Dr. Burt Knack in April 2022.  Reviewed normal monitor results with her and told her it is OK to wait as long as she calls with worsening symptoms. Reiterated to her she will be called if Dr. Burt Knack adds in clinic as she does not want to see an APP.  She is worried about her blood pressure because she has lost weight. Her last few readings were normal (110s-120s/60-80). She will check her BP daily for a week 2 hours after her morning medications and call with results. She was grateful for assistance.

## 2020-02-08 NOTE — Telephone Encounter (Signed)
Called the patient to make her aware that the PA for the Roselyn Meier was approved. Advised that the pharmacy should be able to process the medication again and it be covered at a more reasonable cost. Instructed the patient to call back if there were further concerns

## 2020-02-13 ENCOUNTER — Telehealth: Payer: Self-pay | Admitting: Neurology

## 2020-02-13 NOTE — Telephone Encounter (Signed)
Pt called, [employer] sent me a denial letter stating, denied due to have not tried two other medication; Naratriptan and Rizatriptan. Would like a call from the nurse. Contact me at [phone]

## 2020-02-13 NOTE — Telephone Encounter (Signed)
Pt. is requesting for RN to give her a call so she can discuss medication options. Please advise.

## 2020-02-13 NOTE — Telephone Encounter (Signed)
This appears to be regarding a [place] phone note that is already being charted on. This call can be addressed there.

## 2020-02-13 NOTE — Telephone Encounter (Signed)
I called the patient. She is concerned that there is a warning of use of Ubrelvy with grapefruit and Tumeric, but she does not regularly consume either of these. I think it is ok for her to go on Ubrelvy.

## 2020-02-13 NOTE — Telephone Encounter (Addendum)
I called and spoke to the pharmacist at [facility]. She stated the Roselyn Meier is still showing that a PA is required due to the medication being non-formulary.  She is unable to take triptans due to small vessel disease and hypertension.   States she has some reservations about taking Roselyn Meier, even if it does get approved. The side effects have caused her concern. However, she will move forward if Dr. Jannifer Franklin feels it is best.  Feels her migraines have worsened since her husband passed away in 07-10-2019. She thinks the increase has been related to stress.  Reports previously having a small suppy of oxycodone to use for acute migraine treatment. She used them sparingly and they worked well for her.

## 2020-02-14 ENCOUNTER — Encounter: Payer: Self-pay | Admitting: *Deleted

## 2020-02-14 NOTE — Telephone Encounter (Signed)
We will initiate an expedited appeal. I called [employer] and spoke to [place] again. She will fax [place] a copy of the denial that includes information on the appeals process.

## 2020-02-14 NOTE — Telephone Encounter (Signed)
Appeal letter completed. Once reviewed and signed by MD, it will be faxed to [place] for an expedited review (Fax: [phone]).

## 2020-02-14 NOTE — Telephone Encounter (Addendum)
Per covermymeds key: [id number], [name] was approved. However, the patient received a letter of denial from her insurance plan. I called [employer] at [phone] and spoke to rep, Myra. States the was actually denied. She could not explain the discrepancy with the covermymeds case.   The denial states that [name] will not be covered unless the patient tries two medications in the same category. She has tried sumatriptan in the past. The other covered options are naratiptan and rizatriptan.

## 2020-02-15 NOTE — Telephone Encounter (Signed)
Letter signed, faxed and receipt confirmed. Decision pending.

## 2020-02-15 NOTE — Telephone Encounter (Signed)
Wyteria from [facility] called wanting to inform the RN that the expedited appeal has been received and it has a 72hr turnaround time. Fax number where clinical information can be faxed to is [phone].

## 2020-02-15 NOTE — Telephone Encounter (Signed)
Clinicals printed and faxed to the number below. Confirmation received.

## 2020-02-16 NOTE — Telephone Encounter (Signed)
Timoteo Ace from [employer] called and LVM wanting to inform the provider that the pt's [name] denial has been over turned and it has been approved until 02/15/2021. A letter will be forthcoming.

## 2020-02-16 NOTE — Telephone Encounter (Signed)
Called patient and informed her that the prescription for [name] has been approved from [facility] and is approved through 02/15/2021

## 2020-02-24 DIAGNOSIS — D510 Vitamin B12 deficiency anemia due to intrinsic factor deficiency: Secondary | ICD-10-CM | POA: Diagnosis not present

## 2020-03-05 DIAGNOSIS — H00011 Hordeolum externum right upper eyelid: Secondary | ICD-10-CM | POA: Diagnosis not present

## 2020-03-05 DIAGNOSIS — I10 Essential (primary) hypertension: Secondary | ICD-10-CM | POA: Diagnosis not present

## 2020-03-08 ENCOUNTER — Telehealth: Payer: Self-pay | Admitting: Neurology

## 2020-03-08 ENCOUNTER — Other Ambulatory Visit: Payer: Self-pay | Admitting: Emergency Medicine

## 2020-03-08 MED ORDER — TOPIRAMATE 25 MG PO TABS: ORAL_TABLET | ORAL | 1 refills | Status: DC

## 2020-03-08 NOTE — Telephone Encounter (Signed)
Pt called stating that she would like her topiramate (TOPAMAX) 25 MG tablet be sent in as a 90 day supply to the [facility] in [address]

## 2020-03-18 ENCOUNTER — Telehealth: Payer: Self-pay | Admitting: Neurology

## 2020-03-18 NOTE — Telephone Encounter (Signed)
Patient paged the on-call doctor Sunday night close to 11pm to ask if she could take the [place] with her other medications. She was prescribed Roselyn Meier more than a month ago. No more recent medications added. I reviewed her medication list and did not see a contraindication. Also it is unlikely Dr. Jannifer Franklin would give her any medication that she couldn't take due to a medication interaction. Advised she can take another Ubrelvy in 2 hours if her migraine has not resolved.

## 2020-03-28 DIAGNOSIS — D510 Vitamin B12 deficiency anemia due to intrinsic factor deficiency: Secondary | ICD-10-CM | POA: Diagnosis not present

## 2020-04-15 ENCOUNTER — Other Ambulatory Visit: Payer: Self-pay | Admitting: Neurology

## 2020-04-30 DIAGNOSIS — E539 Vitamin B deficiency, unspecified: Secondary | ICD-10-CM | POA: Diagnosis not present

## 2020-05-06 ENCOUNTER — Other Ambulatory Visit: Payer: Self-pay | Admitting: Physician Assistant

## 2020-05-08 ENCOUNTER — Telehealth: Payer: Self-pay | Admitting: Neurology

## 2020-05-08 NOTE — Telephone Encounter (Signed)
Called  Patient and reassured her it was okay to take the topamax today after missing lastnights dose.    Patient denied further questions, verbalized understanding and expressed appreciation for the phone call.

## 2020-05-08 NOTE — Telephone Encounter (Signed)
Pt. states she did not take topiramate (TOPAMAX) 25 MG tablet last night because she was asleep & she is asking if she should go ahead & take dosage this morning. Please advise.

## 2020-05-22 ENCOUNTER — Ambulatory Visit: Payer: Medicare Other | Admitting: Neurology

## 2020-05-24 ENCOUNTER — Telehealth: Payer: Self-pay | Admitting: Neurology

## 2020-05-24 ENCOUNTER — Telehealth: Payer: Self-pay | Admitting: Cardiovascular Disease

## 2020-05-24 NOTE — Telephone Encounter (Signed)
Pt is asking for a call from RN of Dr Jannifer Franklin to discuss options that she has re: how to take her Ubrogepant (UBRELVY) 50 MG TABS

## 2020-05-24 NOTE — Telephone Encounter (Signed)
I called the patient.  The patient had a headache but it was different from her usual migraine, was not sure whether she could take her [name].  I indicated it would be okay to do this, she was switched off of Imitrex to [place] because of a history of hypertension and small vessel disease.

## 2020-05-24 NOTE — Telephone Encounter (Signed)
The patient reports her BP was high this AM and until after lunch. It seemed to increase when she stood up. She was getting readings such as 191/88, 180/77 and 183/77. She did had a slight headache. She takes her daily medications around 1100. The patient's last BP reading was 129/78 and her HA has resolved. Instructed her to only check her BP once daily, 2 hours after she takes her medications. She will keep a log and bring to her appointment when she reestablishes with Dr. Burt Knack 4/1. She will call prior to that time if her BP is still high. She was grateful for call and agrees with plan.

## 2020-05-24 NOTE — Telephone Encounter (Signed)
Pt c/o BP issue: STAT if pt c/o blurred vision, one-sided weakness or slurred speech  1. What are your last 5 BP readings? Today 3/17 152/75 154/68-11:20 am laying down 196/83-11:20 am sitting up 191/88-12:40 laying down   Patients states she took am meds sometime between                                               11:20-12:40 reading   180/77 182/84 183/77  2. Are you having any other symptoms (ex. Dizziness, headache, blurred vision, passed out)? headache  3. What is your BP issue? Patient states their is pain at the back of her head that had move towards the front of her head. She is scared to walk because of it. Please advise

## 2020-05-28 DIAGNOSIS — E538 Deficiency of other specified B group vitamins: Secondary | ICD-10-CM | POA: Diagnosis not present

## 2020-06-04 DIAGNOSIS — Z6833 Body mass index (BMI) 33.0-33.9, adult: Secondary | ICD-10-CM | POA: Diagnosis not present

## 2020-06-04 DIAGNOSIS — J45909 Unspecified asthma, uncomplicated: Secondary | ICD-10-CM | POA: Diagnosis not present

## 2020-06-04 DIAGNOSIS — E7800 Pure hypercholesterolemia, unspecified: Secondary | ICD-10-CM | POA: Diagnosis not present

## 2020-06-04 DIAGNOSIS — E538 Deficiency of other specified B group vitamins: Secondary | ICD-10-CM | POA: Diagnosis not present

## 2020-06-05 ENCOUNTER — Ambulatory Visit: Payer: Medicare Other | Admitting: Neurology

## 2020-06-06 ENCOUNTER — Other Ambulatory Visit: Payer: Self-pay | Admitting: Physician Assistant

## 2020-06-08 ENCOUNTER — Encounter: Payer: Self-pay | Admitting: Cardiovascular Disease

## 2020-06-08 ENCOUNTER — Other Ambulatory Visit: Payer: Self-pay

## 2020-06-08 ENCOUNTER — Ambulatory Visit: Payer: Medicare Other | Admitting: Cardiovascular Disease

## 2020-06-08 VITALS — BP 140/80 | HR 57 | Ht 60.0 in | Wt 163.2 lb

## 2020-06-08 DIAGNOSIS — I10 Essential (primary) hypertension: Secondary | ICD-10-CM | POA: Diagnosis not present

## 2020-06-08 DIAGNOSIS — E782 Mixed hyperlipidemia: Secondary | ICD-10-CM | POA: Diagnosis not present

## 2020-06-08 MED ORDER — LISINOPRIL 10 MG PO TABS: 10.0000 mg | ORAL_TABLET | Freq: Every day | ORAL | 3 refills | Status: DC

## 2020-06-08 NOTE — Progress Notes (Signed)
Cardiology Office Note:    Date:  06/08/2020   ID:  [name], DOB 23-Jun-1940, MRN [id number]  PCP:  Kathyrn Lass, [facility]  Cardiologist:  Sherren Mocha, MD  Advanced Practice Provider:  No care team member to display Electrophysiologist:  None       Referring MD: Kathyrn Lass, MD   Chief Complaint  Patient presents with  . Hypertension    History of Present Illness:    [name] is a 80 y.o. female with a hx of:  Hypertension  GERD  Autonomic insufficiency  Migraine headaches  History of prior DVT (after lumbar laminectomy 6/13) ? Postop developed orthostatic hypotension; postural tachycardia  Palpitations  Hyperlipidemia  The patient is here alone today. Her husband of 74 years passed away last year and things have been tough for her since then.  The patient has lost a lot of weight over the past year.  She is feeling much better physically with improvement in her back pain, headaches, and energy level.  She denies chest pain or shortness of breath.  No recent problems with leg swelling.  This used to be a big problem for her but it has improved.  Past Medical History:  Diagnosis Date  . Allergy   . Arthritis    "got alot; especially in my spine"  . Chest pain 02/2016  . Chronic back pain    "all of it"  . Classical migraine with intractable migraine 02/25/2016  . Diverticulosis of colon (without mention of hemorrhage)   . DVT of leg (deep venous thrombosis) ([place]) 09/2011   LLE  . Dysrhythmia    notes in epic  . Esophageal reflux   . Family history of malignant neoplasm of gastrointestinal tract   . Fibromyalgia    "dx'd w/it when it first came out"  . Hiatal hernia   . Hyperlipidemia   . Hypertension   . Migraines 09/24/11   "often recently;  more controlled now"  . Obesity, unspecified   . Osteoporosis   . Palpitations    event monitor 8/13: NSR, Sinus brady, Sinus Tachy, PAC // Event monitor 10/21:  NSR, avg HR 63, no AFib/Flutter; no significant arrhythmia  . Personal history of colonic polyps 05/31/2007   adenomatous polyp  . Pneumonia    "3 times at least" (09/24/11)  . Rheumatoid arthritis(714.0)   . Scoliosis   . Stricture and stenosis of esophagus   . Stroke ([place]) 08/2016  . Unspecified gastritis and gastroduodenitis without mention of hemorrhage     Past Surgical History:  Procedure Laterality Date  . BACK SURGERY    . BREAST CYST INCISION AND DRAINAGE  2012   left; "golf-ball sized"  . BREAST SURGERY     cyst lanced and drainded  . COLON SURGERY    . COLONOSCOPY  2009  . COLONOSCOPY WITH PROPOFOL N/A 03/16/2017   Procedure: COLONOSCOPY WITH PROPOFOL;  Surgeon: Gatha Mayer, MD;  Location: [facility];  Service: Endoscopy;  Laterality: N/A;  . ESOPHAGEAL DILATION     "several times"  . ESOPHAGOGASTRODUODENOSCOPY (EGD) WITH PROPOFOL N/A 03/16/2017   Procedure: ESOPHAGOGASTRODUODENOSCOPY (EGD) WITH PROPOFOL;  Surgeon: Gatha Mayer, MD;  Location: [facility];  Service: Endoscopy;  Laterality: N/A;  . EXPLORATORY LAPAROTOMY  04/2000   "for ruptured colon"  . HEMATOMA EVACUATION  09/2011   LLE  . LUMBAR LAMINECTOMY/DECOMPRESSION MICRODISCECTOMY  09/03/2011   Procedure: LUMBAR LAMINECTOMY/DECOMPRESSION MICRODISCECTOMY 2 LEVELS;  Surgeon: Dominica Severin  Levy Sjogren, MD;  Location: [facility];  Service: Neurosurgery;  Laterality: Left;  Left Lumbar Three-Four, Lumbar Four-Five Decompressive Laminectomy  . SALIVARY GLAND SURGERY  ~ 1980   1 removed  . SHOULDER SURGERY  2008   right shoulder and collar bone  . SIGMOID RESECTION / RECTOPEXY  09/1999   Dr Rosana Hoes  . TRIGGER FINGER RELEASE  2004   left thumb  . TUMOR REMOVAL  1960's   left leg  . UPPER GASTROINTESTINAL ENDOSCOPY      Current Medications: Current Meds  Medication Sig  . acetaminophen (TYLENOL) 500 MG tablet Take 500 mg by mouth daily as needed for moderate pain.  [name] albuterol (PROVENTIL HFA;VENTOLIN HFA) 108 (90 Base)  MCG/ACT inhaler Inhale 2 puffs into the lungs every 6 (six) hours as needed for wheezing or shortness of breath.  [name] aspirin EC 81 MG tablet Take 81 mg by mouth daily.  . calcium carbonate (TUMS EX) 750 MG chewable tablet Chew 2 tablets by mouth daily as needed for heartburn.   . cyanocobalamin (,VITAMIN B-12,) 1000 MCG/ML injection Inject 1,000 mcg into the muscle every 30 (thirty) days.  . Dextran 70-Hypromellose ([employer] NATURAL TEARS OP) Apply 1-2 drops to eye daily as needed (dry eyes).  . fluticasone (FLONASE) 50 MCG/ACT nasal spray Place 1 spray into both nostrils daily as needed for allergies.  . hydrochlorothiazide (HYDRODIURIL) 25 MG tablet TAKE 1 TABLET(25 MG) BY MOUTH DAILY  . loratadine (CLARITIN) 10 MG tablet Take 10 mg by mouth daily as needed for allergies.  . metoprolol tartrate (LOPRESSOR) 25 MG tablet TAKE 1 TABLET(25 MG) BY MOUTH TWICE DAILY  . neomycin-bacitracin-polymyxin (NEOSPORIN) ointment Apply 1 application topically as needed for wound care.  . pantoprazole (PROTONIX) 40 MG tablet TAKE 1 TABLET(40 MG) BY MOUTH TWICE DAILY BEFORE A MEAL  . polyethylene glycol (MIRALAX / GLYCOLAX) packet Take 17 g by mouth daily as needed for mild constipation.  . promethazine (PHENERGAN) 25 MG tablet Take 1 tablet (25 mg total) by mouth every 6 (six) hours as needed for nausea or vomiting.  . rosuvastatin (CRESTOR) 5 MG tablet TAKE 1 TABLET BY MOUTH 3 TIMES A WEEK  . topiramate (TOPAMAX) 25 MG tablet Take 2 tablets at night  . Ubrogepant (UBRELVY) 50 MG TABS Take 50 mg by mouth 2 (two) times daily as needed.     Allergies:   Reglan [metoclopramide] and Pravastatin   Social History   Socioeconomic History  . Marital status: Widowed    Spouse name: Not on file  . Number of children: 1  . 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 Highest education level: Not on file  Occupational History    Employer: RETIRED  Tobacco Use  . Smoking status: Never Smoker  . Smokeless tobacco: Never  Used  Vaping Use  . Vaping Use: Never used  Substance and Sexual Activity  . Alcohol use: No  . Drug use: No  . Sexual activity: Not Currently    Partners: Male    Birth control/protection: Post-menopausal  Other Topics Concern  . Not on file  Social History Narrative   Lives at home alone since spouse died in Jul 08, 2019, has neighbor for assistance   Right-handed   Drinks no caffeine   Social Determinants of Radio broadcast assistant Strain: Not on file  Food Insecurity: Not on file  Transportation Needs: Not on file  Physical Activity: Not on file  Stress: Not on file  Social Connections: Not on  file     Family History: The patient's family history includes Colon cancer (age of onset: 6) in her sister; Diabetes in her father and sister; Hypertension in her father and mother; Pancreatic cancer in her sister and sister; Stroke in her father and mother. There is no history of Heart attack or Stomach cancer.  ROS:   Please see the history of present illness.    All other systems reviewed and are negative.  EKGs/Labs/Other Studies Reviewed:    The following studies were reviewed today: Echo 02/13/2016: Study Conclusions   - Left ventricle: The cavity size was normal. Wall thickness was  increased in a pattern of mild LVH. Systolic function was normal.  The estimated ejection fraction was in the range of 50% to 55%.  Wall motion was normal; there were no regional wall motion  abnormalities.   EKG:  EKG is not ordered today.   Recent Labs: 12/13/2019: ALT 11; BUN 17; Creatinine, Ser 0.80; Hemoglobin 15.3; Platelets 307; Potassium 3.8; Sodium 132  Recent Lipid Panel    Component Value Date/Time   CHOL 174 08/03/2019 0951   TRIG 106 08/03/2019 0951   HDL 61 08/03/2019 0951   CHOLHDL 2.9 08/03/2019 0951   CHOLHDL 4.5 06/06/2007 0040   VLDL 20 06/06/2007 0040   LDLCALC 94 08/03/2019 0951     Risk Assessment/Calculations:       Physical Exam:    VS:  BP  140/80   Pulse (!) 57   Ht 5' (1.524 m)   Wt 163 lb 3.2 oz (74 kg)   LMP  (LMP Unknown)   SpO2 98%   BMI 31.87 kg/m     Wt Readings from Last 3 Encounters:  06/08/20 163 lb 3.2 oz (74 kg)  02/01/20 170 lb (77.1 kg)  12/07/19 175 lb 8 oz (79.6 kg)     GEN:  Well nourished, well developed in no acute distress HEENT: Normal NECK: No JVD; No carotid bruits LYMPHATICS: No lymphadenopathy CARDIAC: RRR, no murmurs, rubs, gallops RESPIRATORY:  Clear to auscultation without rales, wheezing or rhonchi  ABDOMEN: Soft, non-tender, non-distended MUSCULOSKELETAL: 1+ left leg edema; No deformity  SKIN: Warm and dry NEUROLOGIC:  Alert and oriented x 3 PSYCHIATRIC:  Normal affect   ASSESSMENT:    1. Essential hypertension   2. Mixed hyperlipidemia    PLAN:    In order of problems listed above:  1. With weight loss, her home blood pressures are reviewed and may be running on the low side.  I asked her to reduce lisinopril to 10 mg daily.  She will continue on metoprolol 25 mg twice daily.  Most recent labs reviewed and look good. 2. Treated with a low intensity statin drug.  She has intolerance of statins it but is tolerating a low-dose of rosuvastatin.  HDL 67, LDL 84, ALT 11.   Medication Adjustments/Labs and Tests Ordered: Current medicines are reviewed at length with the patient today.  Concerns regarding medicines are outlined above.  No orders of the defined types were placed in this encounter.  No orders of the defined types were placed in this encounter.   There are no Patient Instructions on file for this visit.   Signed, Sherren Mocha, MD  06/08/2020 3:23 PM    [facility]

## 2020-06-08 NOTE — Patient Instructions (Signed)
Medication Instructions:  1) DECREASE LISINOPRIL to 10 mg daily *If you need a refill on your cardiac medications before your next appointment, please call your pharmacy*   Follow-Up: At [facility], you and your health needs are our priority.  As part of our continuing mission to provide you with exceptional heart care, we have created designated Provider Care Teams.  These Care Teams include your primary Cardiologist (physician) and Advanced Practice Providers (APPs -  Physician Assistants and Nurse Practitioners) who all work together to provide you with the care you need, when you need it. Your next appointment:   12 month(s) The format for your next appointment:   In Person Provider:   You may see Sherren Mocha, MD or one of the following Advanced Practice Providers on your designated Care Team:    Richardson Dopp, PA-C  [place], [place]

## 2020-06-28 ENCOUNTER — Other Ambulatory Visit: Payer: Self-pay | Admitting: *Deleted

## 2020-06-28 MED ORDER — PROMETHAZINE HCL 25 MG PO TABS: 25.0000 mg | ORAL_TABLET | Freq: Four times a day (QID) | ORAL | 1 refills | Status: DC | PRN

## 2020-07-02 ENCOUNTER — Other Ambulatory Visit: Payer: Self-pay | Admitting: Cardiovascular Disease

## 2020-07-06 DIAGNOSIS — S20162A Insect bite (nonvenomous) of breast, left breast, initial encounter: Secondary | ICD-10-CM | POA: Diagnosis not present

## 2020-07-06 DIAGNOSIS — W57XXXA Bitten or stung by nonvenomous insect and other nonvenomous arthropods, initial encounter: Secondary | ICD-10-CM | POA: Diagnosis not present

## 2020-07-06 DIAGNOSIS — E538 Deficiency of other specified B group vitamins: Secondary | ICD-10-CM | POA: Diagnosis not present

## 2020-07-18 ENCOUNTER — Other Ambulatory Visit: Payer: Self-pay | Admitting: Cardiovascular Disease

## 2020-08-01 ENCOUNTER — Encounter: Payer: Self-pay | Admitting: Neurology

## 2020-08-01 ENCOUNTER — Ambulatory Visit: Payer: Medicare Other | Admitting: Neurology

## 2020-08-01 VITALS — BP 165/67 | HR 67 | Ht 59.0 in | Wt 164.0 lb

## 2020-08-01 DIAGNOSIS — G43119 Migraine with aura, intractable, without status migrainosus: Secondary | ICD-10-CM | POA: Diagnosis not present

## 2020-08-01 MED ORDER — UBRELVY 50 MG PO TABS: 50.0000 mg | ORAL_TABLET | Freq: Two times a day (BID) | ORAL | 3 refills | Status: DC | PRN

## 2020-08-01 MED ORDER — TOPIRAMATE 25 MG PO TABS: ORAL_TABLET | ORAL | 3 refills | Status: DC

## 2020-08-01 MED ORDER — PROMETHAZINE HCL 25 MG PO TABS: 25.0000 mg | ORAL_TABLET | Freq: Four times a day (QID) | ORAL | 1 refills | Status: DC | PRN

## 2020-08-01 NOTE — Patient Instructions (Signed)
Continue current medications  Take Ubrelvy at onset of acute headache See you back in 1 year

## 2020-08-01 NOTE — Progress Notes (Signed)
PATIENT: [name] DOB: 05-29-1940  REASON FOR VISIT: follow up HISTORY FROM: patient  HISTORY OF PRESENT ILLNESS: Today 08/01/20 [name] is a 80 year old female with history of migraine headache. Is on Topamax, takes Roselyn Meier as needed for acute headache. Is not a candidate for triptans use due to HTN, some degree of SVD by MRI of the brain. Has taken Ubrelvy 3 times, works well and quickly to abort migraine. Health has been good, watching her sugars, lost 6 lbs since last seen, over 2 years lost 40 lbs. Lives alone, drives a car. Has supportive family and church. Her husband passed away in Jun 29, 2019. All she takes Phenergan for acute headache due to nausea.  Here today for evaluation unaccompanied.  HISTORY 02/01/2020 Dr. Jannifer Franklin: [name] is a 80 year old right-handed white female with a history of migraine headache.  The patient usually has very few headaches, but recently she has been under stress after she lost her husband.  She was placed on Topamax, she is taking 50 mg at night.  She went to the emergency room on 13 December 2019 with a bad headache.  The patient really has not had any significant problems with headaches since that time.  She has Imitrex to take for her headaches if needed, but she does have a history of hypertension and she has some degree of small vessel disease by MRI of the brain.  The patient is reporting some intermittent tingling of the left hand, this may occur at nighttime, or while driving.  She returns here for further evaluation.   REVIEW OF SYSTEMS: Out of a complete 14 system review of symptoms, the patient complains only of the following symptoms, and all other reviewed systems are negative.  Headache   ALLERGIES: Allergies  Allergen Reactions  . Reglan [Metoclopramide] Other (See Comments)    Speech is garbled, extreme trouble making words  . Pravastatin     Muscle cramps    HOME MEDICATIONS: Outpatient Medications Prior to Visit   Medication Sig Dispense Refill  . acetaminophen (TYLENOL) 500 MG tablet Take 500 mg by mouth daily as needed for moderate pain.    [name] albuterol (PROVENTIL HFA;VENTOLIN HFA) 108 (90 Base) MCG/ACT inhaler Inhale 2 puffs into the lungs every 6 (six) hours as needed for wheezing or shortness of breath.    . calcium carbonate (TUMS EX) 750 MG chewable tablet Chew 2 tablets by mouth daily as needed for heartburn.     . cyanocobalamin (,VITAMIN B-12,) 1000 MCG/ML injection Inject 1,000 mcg into the muscle every 30 (thirty) days.    . Dextran 70-Hypromellose ([employer] NATURAL TEARS OP) Apply 1-2 drops to eye daily as needed (dry eyes).    . fluticasone (FLONASE) 50 MCG/ACT nasal spray Place 1 spray into both nostrils daily as needed for allergies.  1  . hydrochlorothiazide (HYDRODIURIL) 25 MG tablet TAKE 1 TABLET(25 MG) BY MOUTH DAILY 90 tablet 3  . lisinopril (ZESTRIL) 10 MG tablet Take 1 tablet (10 mg total) by mouth daily. 90 tablet 3  . loratadine (CLARITIN) 10 MG tablet Take 10 mg by mouth daily as needed for allergies.    . metoprolol tartrate (LOPRESSOR) 25 MG tablet TAKE 1 TABLET(25 MG) BY MOUTH TWICE DAILY 180 tablet 3  . neomycin-bacitracin-polymyxin (NEOSPORIN) ointment Apply 1 application topically as needed for wound care.    . pantoprazole (PROTONIX) 40 MG tablet TAKE 1 TABLET(40 MG) BY MOUTH TWICE DAILY BEFORE A MEAL 180 tablet 0  . polyethylene glycol (  MIRALAX / GLYCOLAX) packet Take 17 g by mouth daily as needed for mild constipation.    . rosuvastatin (CRESTOR) 5 MG tablet TAKE 1 TABLET BY MOUTH 3 TIMES A WEEK 45 tablet 0  . promethazine (PHENERGAN) 25 MG tablet Take 1 tablet (25 mg total) by mouth every 6 (six) hours as needed for nausea or vomiting. 30 tablet 1  . topiramate (TOPAMAX) 25 MG tablet Take 2 tablets at night 180 tablet 1  . Ubrogepant (UBRELVY) 50 MG TABS Take 50 mg by mouth 2 (two) times daily as needed. 10 tablet 3   No facility-administered medications prior to visit.     PAST MEDICAL HISTORY: Past Medical History:  Diagnosis Date  . Allergy   . Arthritis    "got alot; especially in my spine"  . Chest pain 02/2016  . Chronic back pain    "all of it"  . Classical migraine with intractable migraine 02/25/2016  . Diverticulosis of colon (without mention of hemorrhage)   . DVT of leg (deep venous thrombosis) ([place]) 09/2011   LLE  . Dysrhythmia    notes in epic  . Esophageal reflux   . Family history of malignant neoplasm of gastrointestinal tract   . Fibromyalgia    "dx'd w/it when it first came out"  . Hiatal hernia   . Hyperlipidemia   . Hypertension   . Migraines 09/24/11   "often recently;  more controlled now"  . Obesity, unspecified   . Osteoporosis   . Palpitations    event monitor 8/13: NSR, Sinus brady, Sinus Tachy, PAC // Event monitor 10/21: NSR, avg HR 63, no AFib/Flutter; no significant arrhythmia  . Personal history of colonic polyps 05/31/2007   adenomatous polyp  . Pneumonia    "3 times at least" (09/24/11)  . Rheumatoid arthritis(714.0)   . Scoliosis   . Stricture and stenosis of esophagus   . Stroke ([place]) 08/2016  . Unspecified gastritis and gastroduodenitis without mention of hemorrhage     PAST SURGICAL HISTORY: Past Surgical History:  Procedure Laterality Date  . BACK SURGERY    . BREAST CYST INCISION AND DRAINAGE  2012   left; "golf-ball sized"  . BREAST SURGERY     cyst lanced and drainded  . COLON SURGERY    . COLONOSCOPY  2009  . COLONOSCOPY WITH PROPOFOL N/A 03/16/2017   Procedure: COLONOSCOPY WITH PROPOFOL;  Surgeon: Gatha Mayer, MD;  [facility];  Service: Endoscopy;  Laterality: N/A;  . ESOPHAGEAL DILATION     "several times"  . ESOPHAGOGASTRODUODENOSCOPY (EGD) WITH PROPOFOL N/A 03/16/2017   Procedure: ESOPHAGOGASTRODUODENOSCOPY (EGD) WITH PROPOFOL;  Surgeon: Gatha Mayer, MD;  [facility];  Service: Endoscopy;  Laterality: N/A;  . EXPLORATORY LAPAROTOMY  04/2000   "for ruptured  colon"  . HEMATOMA EVACUATION  09/2011   LLE  . LUMBAR LAMINECTOMY/DECOMPRESSION MICRODISCECTOMY  09/03/2011   Procedure: LUMBAR LAMINECTOMY/DECOMPRESSION MICRODISCECTOMY 2 LEVELS;  Surgeon: Elaina Hoops, MD;  Location: [facility];  Service: Neurosurgery;  Laterality: Left;  Left Lumbar Three-Four, Lumbar Four-Five Decompressive Laminectomy  . SALIVARY GLAND SURGERY  ~ 1980   1 removed  . SHOULDER SURGERY  2008   right shoulder and collar bone  . SIGMOID RESECTION / RECTOPEXY  09/1999   Dr Rosana Hoes  . TRIGGER FINGER RELEASE  2004   left thumb  . TUMOR REMOVAL  1960's   left leg  . UPPER GASTROINTESTINAL ENDOSCOPY      FAMILY HISTORY: Family History  Problem Relation Age of Onset  . Stroke Mother   . Hypertension Mother   . Stroke Father   . Hypertension Father   . Diabetes Father   . Pancreatic cancer Sister   . Diabetes Sister        x 2  . Colon cancer Sister 37  . Pancreatic cancer Sister   . Heart attack Neg Hx   . Stomach cancer Neg Hx     SOCIAL HISTORY: Social History   Socioeconomic History  . Marital status: Widowed    Spouse name: Not on file  . Number of children: 1  . 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 Highest education level: Not on file  Occupational History    Employer: RETIRED  Tobacco Use  . Smoking status: Never Smoker  . Smokeless tobacco: Never Used  Vaping Use  . Vaping Use: Never used  Substance and Sexual Activity  . Alcohol use: No  . Drug use: No  . Sexual activity: Not Currently    Partners: Male    Birth control/protection: Post-menopausal  Other Topics Concern  . Not on file  Social History Narrative   Lives at home alone since spouse died in 03-Jul-2019, has neighbor for assistance   Right-handed   Drinks no caffeine   Social Determinants of Radio broadcast assistant Strain: Not on file  Food Insecurity: Not on file  Transportation Needs: Not on file  Physical Activity: Not on file  Stress: Not on file  Social  Connections: Not on file  Intimate Partner Violence: Not on file   PHYSICAL EXAM  Vitals:   08/01/20 0931  BP: (!) 165/67  Pulse: 67  Weight: 164 lb (74.4 kg)  Height: 4\' 11"  (1.499 m)   Body mass index is 33.12 kg/m.  Generalized: Well developed, in no acute distress  Neurological examination  Mentation: Alert oriented to time, place, history taking. Follows all commands speech and language fluent Cranial nerve II-XII: Pupils were equal round reactive to light. Extraocular movements were full, visual field were full on confrontational test. Facial sensation and strength were normal. Head turning and shoulder shrug  were normal and symmetric. Motor: The motor testing reveals 5 over 5 strength of all 4 extremities. Good symmetric motor tone is noted throughout.  Sensory: Sensory testing is intact to soft touch on all 4 extremities. No evidence of extinction is noted.  Coordination: Cerebellar testing reveals good finger-nose-finger and heel-to-shin bilaterally.  Gait and station: Gait is forward leaning, but independent and steady Reflexes: Deep tendon reflexes are symmetric  DIAGNOSTIC DATA (LABS, IMAGING, TESTING) - I reviewed patient records, labs, notes, testing and imaging myself where available.  Lab Results  Component Value Date   WBC 10.2 12/13/2019   HGB 15.3 (H) 12/13/2019   HCT 45.0 12/13/2019   MCV 82.6 12/13/2019   PLT 307 12/13/2019      Component Value Date/Time   NA 132 (L) 12/13/2019 1624   NA 138 06/06/2019 1300   K 3.8 12/13/2019 1624   CL 96 (L) 12/13/2019 1624   CO2 24 12/13/2019 1610   GLUCOSE 108 (H) 12/13/2019 1624   BUN 17 12/13/2019 1624   BUN 20 06/06/2019 1300   CREATININE 0.80 12/13/2019 1624   CREATININE 0.93 01/21/2016 1237   CALCIUM 9.4 12/13/2019 1610   PROT 7.0 12/13/2019 1610   PROT 6.7 08/03/2019 0951   ALBUMIN 4.1 12/13/2019 1610   ALBUMIN 4.4 08/03/2019 0951   AST 19 12/13/2019 1610  ALT 11 12/13/2019 1610   ALKPHOS 52  12/13/2019 1610   BILITOT 1.4 (H) 12/13/2019 1610   BILITOT 1.1 08/03/2019 0951   GFRNONAA 55 (L) 12/13/2019 1610   GFRAA 61 06/06/2019 1300   Lab Results  Component Value Date   CHOL 174 08/03/2019   HDL 61 08/03/2019   LDLCALC 94 08/03/2019   TRIG 106 08/03/2019   CHOLHDL 2.9 08/03/2019   No results found for: HGBA1C Lab Results  Component Value Date   VITAMINB12 188 (L) 02/06/2011   Lab Results  Component Value Date   TSH 2.569 02/13/2016    ASSESSMENT AND PLAN 80 y.o. year old female  has a past medical history of Allergy, Arthritis, Chest pain (02/2016), Chronic back pain, Classical migraine with intractable migraine (02/25/2016), Diverticulosis of colon (without mention of hemorrhage), DVT of leg (deep venous thrombosis) ([facility]) (09/2011), Dysrhythmia, Esophageal reflux, Family history of malignant neoplasm of gastrointestinal tract, Fibromyalgia, Hiatal hernia, Hyperlipidemia, Hypertension, Migraines (09/24/11), Obesity, unspecified, Osteoporosis, Palpitations, Personal history of colonic polyps (05/31/2007), Pneumonia, Rheumatoid arthritis(714.0), Scoliosis, Stricture and stenosis of esophagus, Stroke ([place]) (08/2016), and Unspecified gastritis and gastroduodenitis without mention of hemorrhage. here with:  1. Migraine headache   -Currently doing well, only 3 migraines since last seen -Continue Topamax 50 mg at bedtime for migraine prevention, wants to continue with current dosing -Continue Ubrelvy PRN for acute headache, may combine with Phenergan for nausea with acute headache, refills were sent in -Follow-up in 1 year or sooner if needed  Evangeline Dakin, DNP 08/01/2020, 10:04 AM [facility] [address], [place], [place] [address] [phone]

## 2020-08-01 NOTE — Progress Notes (Signed)
I have read the note, and I agree with the clinical assessment and plan.  Rosaria Kubin K Kiyaan Haq   

## 2020-08-07 DIAGNOSIS — E538 Deficiency of other specified B group vitamins: Secondary | ICD-10-CM | POA: Diagnosis not present

## 2020-08-24 ENCOUNTER — Other Ambulatory Visit: Payer: Self-pay

## 2020-08-24 ENCOUNTER — Encounter (HOSPITAL_BASED_OUTPATIENT_CLINIC_OR_DEPARTMENT_OTHER): Payer: Self-pay | Admitting: Emergency Medicine

## 2020-08-24 ENCOUNTER — Emergency Department (HOSPITAL_BASED_OUTPATIENT_CLINIC_OR_DEPARTMENT_OTHER)
Admission: EM | Admit: 2020-08-24 | Discharge: 2020-08-24 | Disposition: A | Discharge status: Home / Self Care | Payer: Medicare Other | Attending: Emergency Medicine | Admitting: Emergency Medicine

## 2020-08-24 ENCOUNTER — Emergency Department (HOSPITAL_BASED_OUTPATIENT_CLINIC_OR_DEPARTMENT_OTHER): Payer: Medicare Other

## 2020-08-24 DIAGNOSIS — Z85831 Personal history of malignant neoplasm of soft tissue: Secondary | ICD-10-CM | POA: Diagnosis not present

## 2020-08-24 DIAGNOSIS — I8001 Phlebitis and thrombophlebitis of superficial vessels of right lower extremity: Secondary | ICD-10-CM | POA: Diagnosis not present

## 2020-08-24 DIAGNOSIS — Z20822 Contact with and (suspected) exposure to covid-19: Secondary | ICD-10-CM | POA: Insufficient documentation

## 2020-08-24 DIAGNOSIS — I10 Essential (primary) hypertension: Secondary | ICD-10-CM | POA: Diagnosis not present

## 2020-08-24 DIAGNOSIS — I82812 Embolism and thrombosis of superficial veins of left lower extremities: Secondary | ICD-10-CM | POA: Diagnosis not present

## 2020-08-24 DIAGNOSIS — M79606 Pain in leg, unspecified: Secondary | ICD-10-CM | POA: Diagnosis not present

## 2020-08-24 DIAGNOSIS — Z7982 Long term (current) use of aspirin: Secondary | ICD-10-CM | POA: Diagnosis not present

## 2020-08-24 DIAGNOSIS — Z85038 Personal history of other malignant neoplasm of large intestine: Secondary | ICD-10-CM | POA: Diagnosis not present

## 2020-08-24 DIAGNOSIS — Z79899 Other long term (current) drug therapy: Secondary | ICD-10-CM | POA: Diagnosis not present

## 2020-08-24 DIAGNOSIS — M79662 Pain in left lower leg: Secondary | ICD-10-CM | POA: Diagnosis present

## 2020-08-24 LAB — CBC WITH DIFFERENTIAL/PLATELET
Abs Immature Granulocytes: 0.01 10*3/uL (ref 0.00–0.07)
Basophils Absolute: 0.1 10*3/uL (ref 0.0–0.1)
Basophils Relative: 1 %
Eosinophils Absolute: 0.2 10*3/uL (ref 0.0–0.5)
Eosinophils Relative: 3 %
HCT: 41.6 % (ref 36.0–46.0)
Hemoglobin: 13.5 g/dL (ref 12.0–15.0)
Immature Granulocytes: 0 %
Lymphocytes Relative: 33 %
Lymphs Abs: 2.4 10*3/uL (ref 0.7–4.0)
MCH: 28.1 pg (ref 26.0–34.0)
MCHC: 32.5 g/dL (ref 30.0–36.0)
MCV: 86.5 fL (ref 80.0–100.0)
Monocytes Absolute: 0.7 10*3/uL (ref 0.1–1.0)
Monocytes Relative: 10 %
Neutro Abs: 3.8 10*3/uL (ref 1.7–7.7)
Neutrophils Relative %: 53 %
Platelets: 245 10*3/uL (ref 150–400)
RBC: 4.81 MIL/uL (ref 3.87–5.11)
RDW: 14 % (ref 11.5–15.5)
WBC: 7.3 10*3/uL (ref 4.0–10.5)
nRBC: 0 % (ref 0.0–0.2)

## 2020-08-24 LAB — COMPREHENSIVE METABOLIC PANEL
ALT: 7 U/L (ref 0–44)
AST: 15 U/L (ref 15–41)
Albumin: 4.1 g/dL (ref 3.5–5.0)
Alkaline Phosphatase: 46 U/L (ref 38–126)
Anion gap: 7 (ref 5–15)
BUN: 17 mg/dL (ref 8–23)
CO2: 28 mmol/L (ref 22–32)
Calcium: 9.3 mg/dL (ref 8.9–10.3)
Chloride: 104 mmol/L (ref 98–111)
Creatinine, Ser: 1 mg/dL (ref 0.44–1.00)
GFR, Estimated: 57 mL/min — ABNORMAL LOW (ref >60)
Glucose, Bld: 89 mg/dL (ref 70–99)
Potassium: 3.7 mmol/L (ref 3.5–5.1)
Sodium: 139 mmol/L (ref 135–145)
Total Bilirubin: 0.9 mg/dL (ref 0.3–1.2)
Total Protein: 6.5 g/dL (ref 6.5–8.1)

## 2020-08-24 MED ORDER — ASPIRIN 81 MG PO CHEW: 324.0000 mg | CHEWABLE_TABLET | Freq: Every day | ORAL | 1 refills | Status: AC

## 2020-08-24 NOTE — ED Provider Notes (Signed)
[facility] EMERGENCY DEPT Provider Note   CSN: [id number] Arrival date & time: 08/24/20  1825     History Chief Complaint  Patient presents with   Leg Pain    [name] is a 80 y.o. female.  HPI     80 year old female with a history below including history of prior DVT presents with concern for left leg pain beginning yesterday.  Reports an aching pain in her calf which is radiating upwards towards her thigh.  Denies injuries, fever, chest pain or shortness of breath.  Pain is worse with palpation and movement.  The pain is moderate aching cramping.  She had a close exposure to COVID-19, but does not have any symptoms of this.   Past Medical History:  Diagnosis Date   Allergy    Arthritis    "got alot; especially in my spine"   Chest pain 02/2016   Chronic back pain    "all of it"   Classical migraine with intractable migraine 02/25/2016   Diverticulosis of colon (without mention of hemorrhage)    DVT of leg (deep venous thrombosis) ([place]) 09/2011   LLE   Dysrhythmia    notes in epic   Esophageal reflux    Family history of malignant neoplasm of gastrointestinal tract    Fibromyalgia    "dx'd w/it when it first came out"   Hiatal hernia    Hyperlipidemia    Hypertension    Migraines 09/24/11   "often recently;  more controlled now"   Obesity, unspecified    Osteoporosis    Palpitations    event monitor 8/13: NSR, Sinus brady, Sinus Tachy, PAC // Event monitor 10/21: NSR, avg HR 63, no AFib/Flutter; no significant arrhythmia   Personal history of colonic polyps 05/31/2007   adenomatous polyp   Pneumonia    "3 times at least" (09/24/11)   Rheumatoid arthritis(714.0)    Scoliosis    Stricture and stenosis of esophagus    Stroke ([place]) 08/2016   Unspecified gastritis and gastroduodenitis without mention of hemorrhage     Patient Active Problem List   Diagnosis Date Noted   Pain in both lower extremities 01/25/2018   Newly diagnosed diabetes  ([place]) 12/25/2017   Esophageal dysphagia    Family history of colon cancer    Benign neoplasm of descending colon    Classical migraine with intractable migraine 02/25/2016   Chest pain 02/13/2016   Leg pain 01/28/2013   Autonomic orthostatic hypotension 11/25/2011   Nausea 09/25/2011   Tachycardia, paroxysmal ([place]) 09/24/2011   DVT (deep venous thrombosis) ([place]) 09/24/2011   Hyponatremia 09/24/2011   Essential hypertension 05/13/2011   Dyspnea on exertion 04/23/2011   Palpitations 04/23/2011   Hyperlipidemia 04/23/2011   Hx of colonic polyps 02/06/2011   Family history of malignant neoplasm of gastrointestinal tract 02/06/2011   Status post partial resection of colon 02/06/2011   Obesity 02/06/2011   EXOGENOUS OBESITY 07/01/2007   GERD 07/01/2007   Acute intractable headache 07/01/2007   Hx of adenomatous polyp of colon 06/02/2007   GASTRITIS 05/31/2007   HIATAL HERNIA 05/31/2007   DIVERTICULOSIS, COLON 05/31/2007   Esophageal ring, acquired 02/18/2002    Past Surgical History:  Procedure Laterality Date   BACK SURGERY     BREAST CYST INCISION AND DRAINAGE  2012   left; "golf-ball sized"   BREAST SURGERY     cyst lanced and drainded   COLON SURGERY     COLONOSCOPY  2009   COLONOSCOPY WITH PROPOFOL N/A  03/16/2017   Procedure: COLONOSCOPY WITH PROPOFOL;  Surgeon: Gatha Mayer, MD;  Location: [facility];  Service: Endoscopy;  Laterality: N/A;   ESOPHAGEAL DILATION     "several times"   ESOPHAGOGASTRODUODENOSCOPY (EGD) WITH PROPOFOL N/A 03/16/2017   Procedure: ESOPHAGOGASTRODUODENOSCOPY (EGD) WITH PROPOFOL;  Surgeon: Gatha Mayer, MD;  Location: [facility];  Service: Endoscopy;  Laterality: N/A;   EXPLORATORY LAPAROTOMY  04/2000   "for ruptured colon"   HEMATOMA EVACUATION  09/2011   LLE   LUMBAR LAMINECTOMY/DECOMPRESSION MICRODISCECTOMY  09/03/2011   Procedure: LUMBAR LAMINECTOMY/DECOMPRESSION MICRODISCECTOMY 2 LEVELS;  Surgeon: Elaina Hoops, MD;  Location: [facility];  Service: Neurosurgery;  Laterality: Left;  Left Lumbar Three-Four, Lumbar Four-Five Decompressive Laminectomy   SALIVARY GLAND SURGERY  ~ 1980   1 removed   SHOULDER SURGERY  2008   right shoulder and collar bone   SIGMOID RESECTION / RECTOPEXY  09/1999   Dr Emiliano Dyer FINGER RELEASE  2004   left thumb   TUMOR REMOVAL  1960's   left leg   UPPER GASTROINTESTINAL ENDOSCOPY       OB History   No obstetric history on file.     Family History  Problem Relation Age of Onset   Stroke Mother    Hypertension Mother    Stroke Father    Hypertension Father    Diabetes Father    Pancreatic cancer Sister    Diabetes Sister        x 2   Colon cancer Sister 46   Pancreatic cancer Sister    Heart attack Neg Hx    Stomach cancer Neg Hx     Social History   Tobacco Use   Smoking status: Never   Smokeless tobacco: Never  Vaping Use   Vaping Use: Never used  Substance Use Topics   Alcohol use: No   Drug use: No    Home Medications Prior to Admission medications   Medication Sig Start Date End Date Taking? Authorizing Provider  aspirin 81 MG chewable tablet Chew 4 tablets (324 mg total) by mouth daily. 08/24/20 10/08/20 Yes Gareth Morgan, MD  acetaminophen (TYLENOL) 500 MG tablet Take 500 mg by mouth daily as needed for moderate pain.    [provider]  albuterol (PROVENTIL HFA;VENTOLIN HFA) 108 (90 Base) MCG/ACT inhaler Inhale 2 puffs into the lungs every 6 (six) hours as needed for wheezing or shortness of breath.    [provider]  calcium carbonate (TUMS EX) 750 MG chewable tablet Chew 2 tablets by mouth daily as needed for heartburn.     [provider]  cyanocobalamin (,VITAMIN B-12,) 1000 MCG/ML injection Inject 1,000 mcg into the muscle every 30 (thirty) days.    [provider]  Dextran 70-Hypromellose ([employer] NATURAL TEARS OP) Apply 1-2 drops to eye daily as needed (dry eyes).    [provider]  fluticasone (FLONASE)  50 MCG/ACT nasal spray Place 1 spray into both nostrils daily as needed for allergies. 08/02/16   [provider]  hydrochlorothiazide (HYDRODIURIL) 25 MG tablet TAKE 1 TABLET(25 MG) BY MOUTH DAILY 07/02/20   Sherren Mocha, MD  lisinopril (ZESTRIL) 10 MG tablet Take 1 tablet (10 mg total) by mouth daily. 06/08/20 06/03/21  Sherren Mocha, MD  loratadine (CLARITIN) 10 MG tablet Take 10 mg by mouth daily as needed for allergies.    [provider]  metoprolol tartrate (LOPRESSOR) 25 MG tablet TAKE 1 TABLET(25 MG) BY MOUTH TWICE DAILY 07/18/20  Sherren Mocha, MD  neomycin-bacitracin-polymyxin (NEOSPORIN) ointment Apply 1 application topically as needed for wound care.    [provider]  pantoprazole (PROTONIX) 40 MG tablet TAKE 1 TABLET(40 MG) BY MOUTH TWICE DAILY BEFORE A MEAL 05/02/19   Gatha Mayer, MD  polyethylene glycol [facility] / GLYCOLAX) packet Take 17 g by mouth daily as needed for mild constipation.    [provider]  promethazine (PHENERGAN) 25 MG tablet Take 1 tablet (25 mg total) by mouth every 6 (six) hours as needed for nausea or vomiting. 08/01/20   Suzzanne Cloud, NP  rosuvastatin (CRESTOR) 5 MG tablet TAKE 1 TABLET BY MOUTH 3 TIMES A WEEK 05/08/20   Sherren Mocha, MD  topiramate (TOPAMAX) 25 MG tablet Take 2 tablets at night 08/01/20   Suzzanne Cloud, NP  Ubrogepant (UBRELVY) 50 MG TABS Take 50 mg by mouth 2 (two) times daily as needed. 08/01/20   Suzzanne Cloud, NP    Allergies    Reglan [metoclopramide] and Pravastatin  Review of Systems   Review of Systems  Constitutional:  Negative for fever.  HENT:  Negative for sore throat.   Eyes:  Negative for visual disturbance.  Respiratory:  Negative for cough and shortness of breath.   Cardiovascular:  Positive for leg swelling. Negative for chest pain.  Gastrointestinal:  Negative for abdominal pain.  Genitourinary:  Negative for difficulty urinating.  Musculoskeletal:  Positive for myalgias.  Negative for back pain and neck pain.  Skin:  Negative for rash.  Neurological:  Negative for syncope and headaches.   Physical Exam Updated Vital Signs BP (!) 158/70 (BP Location: Right Arm)   Pulse 62   Temp 98.4 F (36.9 C) (Oral)   Resp 20   Ht 4\' 11"  (1.499 m)   Wt 72.6 kg   LMP  (LMP Unknown)   SpO2 100%   BMI 32.32 kg/m   Physical Exam Vitals and nursing note reviewed.  Constitutional:      General: She is not in acute distress.    Appearance: Normal appearance. She is not ill-appearing, toxic-appearing or diaphoretic.  HENT:     Head: Normocephalic.  Eyes:     Conjunctiva/sclera: Conjunctivae normal.  Cardiovascular:     Rate and Rhythm: Normal rate and regular rhythm.     Pulses: Normal pulses.  Pulmonary:     Effort: Pulmonary effort is normal. No respiratory distress.  Musculoskeletal:        General: Tenderness (left calf, extension up towards posterior thigh) present. No deformity or signs of injury.     Cervical back: No rigidity.  Skin:    General: Skin is warm and dry.     Coloration: Skin is not jaundiced or pale.  Neurological:     General: No focal deficit present.     Mental Status: She is alert and oriented to person, place, and time.    ED Results / Procedures / Treatments   Labs (all labs ordered are listed, but only abnormal results are displayed) Labs Reviewed  COMPREHENSIVE METABOLIC PANEL - Abnormal; Notable for the following components:      Result Value   GFR, Estimated 57 (*)    All other components within normal limits  SARS CORONAVIRUS 2 (TAT 6-24 HRS)  CBC WITH DIFFERENTIAL/PLATELET    EKG None  Radiology US Venous Img Lower Unilateral Left  Result Date: 08/24/2020 CLINICAL DATA:  Left calf pain EXAM: LEFT LOWER EXTREMITY VENOUS DOPPLER ULTRASOUND TECHNIQUE: Gray-scale sonography with graded  compression, as well as color Doppler and duplex ultrasound were performed to evaluate the lower extremity deep venous systems from  the level of the common femoral vein and including the common femoral, femoral, profunda femoral, popliteal and calf veins including the posterior tibial, peroneal and gastrocnemius veins when visible. The superficial great saphenous vein was also interrogated. Spectral Doppler was utilized to evaluate flow at rest and with distal augmentation maneuvers in the common femoral, femoral and popliteal veins. COMPARISON:  04/05/2012 FINDINGS: Contralateral Common Femoral Vein: Respiratory phasicity is normal and symmetric with the symptomatic side. No evidence of thrombus. Normal compressibility. Common Femoral Vein: No evidence of thrombus. Normal compressibility, respiratory phasicity and response to augmentation. Saphenofemoral Junction: No evidence of thrombus. Normal compressibility and flow on color Doppler imaging. Profunda Femoral Vein: No evidence of thrombus. Normal compressibility and flow on color Doppler imaging. Femoral Vein: No evidence of thrombus. Normal compressibility, respiratory phasicity and response to augmentation. Popliteal Vein: No evidence of thrombus. Normal compressibility, respiratory phasicity and response to augmentation. Calf Veins: No evidence of thrombus. Normal compressibility and flow on color Doppler imaging. Superficial Great Saphenous Vein: Occlusive mixed echogenicity thrombus within the superficial great saphenous vein from the mid to distal thigh to the upper calf. Venous Reflux:  None. Other Findings: Elongated fluid collection within the popliteal fossa measuring up to 3.9 cm, likely Baker's cyst. IMPRESSION: 1. No evidence of left lower extremity deep venous thrombosis. 2. Subacute to chronic appearing superficial thrombosis within the left great saphenous vein from the level of the mid to distal thigh to the upper calf. 3. Probable Baker's cyst. Electronically Signed   By: Davina Poke D.O.   On: 08/24/2020 20:32    Procedures Procedures   Medications Ordered in  ED Medications - No data to display  ED Course  I have reviewed the triage vital signs and the nursing notes.  Pertinent labs & imaging results that were available during my care of the patient were reviewed by me and considered in my medical decision making (see chart for details).    MDM Rules/Calculators/A&P                           80 year old female with a history below including history of prior DVT presents with concern for left leg pain beginning yesterday.  Labs show no significant electrolyte abnormalities.  She has good arterial pulses bilaterally, no sign of cellulitis or abscess.  DVT ultrasound shows a superficial thrombus within the left great saphenous vein which appears subacute to chronic.  By description of size, discussed that it is borderline in terms of starting anticoagulation given presence in the greater saphenous vein in size possibly greater than 5 cm.  However, it is also a chronic appearing thrombus.  Discussed possibility of anticoagulation in this setting but agree given superficial, chronic, and distance from deep veins feel conservative management appropriate and agreed to initiation of aspirin and outpatient follow up. COVID test ordered given exposure. Patient discharged in stable condition with understanding of reasons to return.    Final Clinical Impression(s) / ED Diagnoses Final diagnoses:  Chronic superficial venous thrombosis of left lower extremity    Rx / DC Orders ED Discharge Orders          Ordered    aspirin 81 MG chewable tablet  Daily        08/24/20 2102             Amenda Duclos,  Junie Panning, MD 08/26/20 [phone]

## 2020-08-24 NOTE — ED Triage Notes (Signed)
Pt presents to ED POV. Pt c/ L leg pain since yesterday. Pt reports pain began in calf and has radiated up since. Pt repors hx of DTV in same leg. Not on blood thinner.

## 2020-08-24 NOTE — ED Notes (Signed)
This RN presented the AVS utilizing Teachback Method. Patient verbalizes understanding of Discharge Instructions. Opportunity for Questioning and Answers were provided. Patient Discharged from ED ambulatory to Home via Self.

## 2020-08-25 ENCOUNTER — Telehealth: Payer: Self-pay

## 2020-08-25 LAB — SARS CORONAVIRUS 2 (TAT 6-24 HRS): SARS Coronavirus 2: NEGATIVE

## 2020-08-25 NOTE — Telephone Encounter (Signed)
Pt called for results for covid. Informed results were negative. Pt verbalized understanding.

## 2020-08-27 DIAGNOSIS — Z1231 Encounter for screening mammogram for malignant neoplasm of breast: Secondary | ICD-10-CM | POA: Diagnosis not present

## 2020-08-27 DIAGNOSIS — Z01419 Encounter for gynecological examination (general) (routine) without abnormal findings: Secondary | ICD-10-CM | POA: Diagnosis not present

## 2020-08-27 DIAGNOSIS — M8588 Other specified disorders of bone density and structure, other site: Secondary | ICD-10-CM | POA: Diagnosis not present

## 2020-08-27 DIAGNOSIS — Z6834 Body mass index (BMI) 34.0-34.9, adult: Secondary | ICD-10-CM | POA: Diagnosis not present

## 2020-09-04 DIAGNOSIS — E538 Deficiency of other specified B group vitamins: Secondary | ICD-10-CM | POA: Diagnosis not present

## 2020-09-14 DIAGNOSIS — I82890 Acute embolism and thrombosis of other specified veins: Secondary | ICD-10-CM | POA: Diagnosis not present

## 2020-09-14 DIAGNOSIS — Z6834 Body mass index (BMI) 34.0-34.9, adult: Secondary | ICD-10-CM | POA: Diagnosis not present

## 2020-09-17 ENCOUNTER — Other Ambulatory Visit (HOSPITAL_COMMUNITY): Payer: Self-pay | Admitting: Family Medicine

## 2020-09-17 DIAGNOSIS — Z09 Encounter for follow-up examination after completed treatment for conditions other than malignant neoplasm: Secondary | ICD-10-CM

## 2020-09-17 DIAGNOSIS — Z86718 Personal history of other venous thrombosis and embolism: Secondary | ICD-10-CM

## 2020-09-20 ENCOUNTER — Ambulatory Visit (HOSPITAL_COMMUNITY)
Admission: RE | Admit: 2020-09-20 | Discharge: 2020-09-20 | Disposition: A | Discharge status: Home / Self Care | Payer: Medicare Other | Source: Ambulatory Visit | Attending: Family Medicine | Admitting: Family Medicine

## 2020-09-20 ENCOUNTER — Other Ambulatory Visit: Payer: Self-pay

## 2020-09-20 DIAGNOSIS — Z09 Encounter for follow-up examination after completed treatment for conditions other than malignant neoplasm: Secondary | ICD-10-CM

## 2020-09-20 DIAGNOSIS — Z86718 Personal history of other venous thrombosis and embolism: Secondary | ICD-10-CM

## 2020-09-20 NOTE — Progress Notes (Signed)
VASCULAR LAB    Left  has been performed.  See CV proc for preliminary results.  Called Brooke with results  Deina Lipsey, RVT 09/20/2020, 10:45 AM

## 2020-10-02 DIAGNOSIS — E538 Deficiency of other specified B group vitamins: Secondary | ICD-10-CM | POA: Diagnosis not present

## 2020-10-10 ENCOUNTER — Other Ambulatory Visit: Payer: Self-pay

## 2020-10-10 MED ORDER — TOPIRAMATE 25 MG PO TABS: ORAL_TABLET | ORAL | 3 refills | Status: DC

## 2020-10-17 ENCOUNTER — Telehealth: Payer: Self-pay | Admitting: Neurology

## 2020-10-17 NOTE — Telephone Encounter (Signed)
Pt called, wanting to know if can take over-the-counter memory  medication. Would like a call from the nurse.

## 2020-10-18 NOTE — Telephone Encounter (Signed)
I called the patient.  She has noted some mild forgetfulness, wants to know if she can start Prevagen over-the-counter.  I am okay with this, but we may need to address her memory issues may see her in the office next.  She is not clear whether the Topamax had any impact on her memory when she started the medication.

## 2020-10-30 DIAGNOSIS — E538 Deficiency of other specified B group vitamins: Secondary | ICD-10-CM | POA: Diagnosis not present

## 2020-11-09 DIAGNOSIS — Z6834 Body mass index (BMI) 34.0-34.9, adult: Secondary | ICD-10-CM | POA: Diagnosis not present

## 2020-11-09 DIAGNOSIS — H6121 Impacted cerumen, right ear: Secondary | ICD-10-CM | POA: Diagnosis not present

## 2020-12-05 ENCOUNTER — Other Ambulatory Visit: Payer: Self-pay | Admitting: Cardiovascular Disease

## 2020-12-05 DIAGNOSIS — E538 Deficiency of other specified B group vitamins: Secondary | ICD-10-CM | POA: Diagnosis not present

## 2021-01-03 DIAGNOSIS — E538 Deficiency of other specified B group vitamins: Secondary | ICD-10-CM | POA: Diagnosis not present

## 2021-01-23 ENCOUNTER — Telehealth: Payer: Self-pay | Admitting: *Deleted

## 2021-01-23 NOTE — Telephone Encounter (Signed)
Roselyn Meier PA, key: [id number]. Your information has been submitted to [place]. [employer] [place] will review the request and notify you of the determination decision directly, typically within 3 business days of your submission and once all necessary information is received. If [employer] has not responded within the specified timeframe or if you have any questions about your PA submission, contact [place] directly at [facility]) [phone] or ([place]) [phone].

## 2021-01-23 NOTE — Telephone Encounter (Signed)
Ubrelvy approved Effective from 01/23/2021 through 01/23/2022.

## 2021-01-23 NOTE — Telephone Encounter (Signed)
[employer] called back with additional clinical questions. She will submit it for review, decision in 24 hours by phone.

## 2021-01-28 DIAGNOSIS — Z23 Encounter for immunization: Secondary | ICD-10-CM | POA: Diagnosis not present

## 2021-01-28 DIAGNOSIS — Z0000 Encounter for general adult medical examination without abnormal findings: Secondary | ICD-10-CM | POA: Diagnosis not present

## 2021-01-28 DIAGNOSIS — Z1389 Encounter for screening for other disorder: Secondary | ICD-10-CM | POA: Diagnosis not present

## 2021-02-04 DIAGNOSIS — E538 Deficiency of other specified B group vitamins: Secondary | ICD-10-CM | POA: Diagnosis not present

## 2021-02-12 DIAGNOSIS — M25551 Pain in right hip: Secondary | ICD-10-CM | POA: Diagnosis not present

## 2021-02-22 ENCOUNTER — Telehealth: Payer: Self-pay | Admitting: Physician Assistant

## 2021-02-22 DIAGNOSIS — I10 Essential (primary) hypertension: Secondary | ICD-10-CM

## 2021-02-22 NOTE — Telephone Encounter (Addendum)
° °  The patient called the answering service after-hours today asking for advice on her BP. Chart reviewed. Also note prior h/o autonomic insufficiency. At last OV 06/2020 Dr. Burt Knack had reviewed home BP readings and were on the low side so lisinopril was reduced. Last office BP readings have ranged 140-160s/50s-80s. She was getting out her Christmas tree from the attic today and it made her very emotional and her BP went up, last checked at 177/97, which is higher than usual for her. No cardiac or neurologic symptoms. She called to ask if she could take her metoprolol and lisinopril earlier this evening (instead of tonight). I told her I agree with this plan and recommended she keep a log over the weekend and submit readings for Dr. Antionette Char review on Monday to determine if perhaps lisinopril needs to be increased back. She verbalized understanding and gratitude.  Charlie Pitter, PA-C

## 2021-02-25 NOTE — Telephone Encounter (Signed)
I spoke with patient. She reports the following BP readings- 12/17- 139/70, 62 at 3 PM             114/72, 59             177/96, -evening 12/18-175/81-AM            118/61- 3 PM            138/62-5 PM    12/19-    161/70-3 AM  12/19-133/60 -12:15 PM            171/78 at 1 PM --about this time she developed a migraine aura.  Patient reports having severe migraines.  She took migraine medicine and went to bed.  Never developed severe headache. She did not take morning metoprolol and HCTZ until 1:00 today. She reports the following readings this afternoon- 190/82 at 2:45 184/83 190/87 203/73 around 4:50 192/85 at 5:00. She takes lisinopril at bedtime. She has been holding this at times when diastolic is below 50.  She carefully watches salt in her diet. She is due to take lisinopril and metoprolol this evening and I told her this should help with her BP.  I advised her she could take lisinopril sooner than bedtime today if needed. Will forward readings to Dr Burt Knack for review/recommendations.

## 2021-02-25 NOTE — Telephone Encounter (Signed)
Patient is calling due to not being called back in regards to the call she made Friday 02/22/21 in regards to her BP.

## 2021-03-04 NOTE — Telephone Encounter (Signed)
Recommend a [facility] visit to review medications, BP trend, and plan with medication adjustments moving forward. thanks

## 2021-03-05 DIAGNOSIS — E538 Deficiency of other specified B group vitamins: Secondary | ICD-10-CM | POA: Diagnosis not present

## 2021-03-05 NOTE — Telephone Encounter (Signed)
Attempted to call the pt back to endorse recommendations per Dr. Burt Knack, and she did not answer, phone kept ringing with no voicemail capability.  Triage will follow-up with the pt at a later time.

## 2021-03-15 NOTE — Addendum Note (Signed)
Addended by: Antonieta Iba on: 03/15/2021 09:08 AM   Modules accepted: Orders

## 2021-03-15 NOTE — Telephone Encounter (Signed)
Patient was returning call. Please advise ?

## 2021-03-15 NOTE — Telephone Encounter (Signed)
Spoke with the patient and advised her that Dr. Burt Knack has referred her to the HTN clinic. Patient verbalized understanding and is aware that she will be called for an appointment. Patient advised to keep a log of BP readings to bring with her to that appointment.

## 2021-03-15 NOTE — Telephone Encounter (Signed)
Left message for patient to call back  

## 2021-04-02 DIAGNOSIS — E538 Deficiency of other specified B group vitamins: Secondary | ICD-10-CM | POA: Diagnosis not present

## 2021-04-15 ENCOUNTER — Ambulatory Visit: Payer: Medicare Other | Admitting: Pharmacist

## 2021-04-15 ENCOUNTER — Other Ambulatory Visit: Payer: Self-pay

## 2021-04-15 VITALS — BP 152/60 | HR 58

## 2021-04-15 DIAGNOSIS — I10 Essential (primary) hypertension: Secondary | ICD-10-CM

## 2021-04-15 MED ORDER — METOPROLOL TARTRATE 25 MG PO TABS: 12.5000 mg | ORAL_TABLET | Freq: Two times a day (BID) | ORAL | 3 refills | Status: DC

## 2021-04-15 NOTE — Patient Instructions (Addendum)
Start taking metoprolol 12.5mg  (1/2 tablet) twice a day Continue lisinopril 10mg  daily and hydrochlorothiazide 25mg  daily. Please take your medications at a consistent time every day. Please bring your log and your blood pressure cuff with you to your next appointment Please call me at [phone] with any questions

## 2021-04-15 NOTE — Progress Notes (Signed)
Patient ID: [name]                 DOB: January 11, 1941                      MRN: [id number]     HPI: [name] is a 81 y.o. female referred by Dr. Burt Knack to HTN clinic. PMH is significant for HTN, autonomic insufficiency, DVT, HLD and palpitations. At last OV 2020/06/29 Dr. Burt Knack had reviewed home BP readings and were on the low side so lisinopril was reduced to 10mg  daily. Last office BP readings have ranged 140-160s/50s-80s. She called the office on 12/16 afterhours asking for advise on BP as her blood pressure was 177/97 after getting her christmas tree out of the attic which made her emotional. She reported more BP readings on 12/19.   12/17- 139/70, 62 at 3 PM             114/72, 59             177/96, -evening 12/18-175/81-AM            118/61- 3 PM            138/62-5 PM    12/19-    161/70-3 AM  Admitted that she sometimes holds lisinopril at night when DBP is in the 50's. She was referred to HTN clinic.  Patient presents today to HTN clinic. She brings in very detailed log of blood pressure and what medications she has taken. She takes her BP 3-4 times a day. She goes to bed very late (2-3AM). Takes her second metoprolol and her lisinopril at this time. She sometimes only takes 1/2 tablet because they thought her medications needed to be 12 hours apart. If she thought her BP was low or she took her AM medications late, she would only take 1/2 tablet.  She has not been taking her HCTZ and first metoprolol at a consistent time. She is afraid of her DBP dropping too low. Blood pressure ranges from 120-180/60-80's. HR is 50's-60's. She took her AM medications today around 10 AM. Apt at 10:30.  Current HTN meds: metoprolol tartrate 25mg  twice a day, lisinopril 10mg  daily (PM), hydrochlorothiazide 25mg  daily (AM) Previously tried: diltiazem BP goal: <140/90  Family History: The patient's family history includes Colon cancer (age of onset: 40) in her sister; Diabetes in her father  and sister; Hypertension in her father and mother; Pancreatic cancer in her sister and sister; Stroke in her father and mother. There is no history of Heart attack or Stomach cancer.  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   Highest education level: Not on file  Occupational History    Employer: RETIRED  Tobacco Use   Smoking status: Never   Smokeless tobacco: Never  Vaping Use   Vaping Use: Never used  Substance and Sexual Activity   Alcohol use: No   Drug use: No   Sexual activity: Not Currently    Partners: Male    Birth control/protection: Post-menopausal  Other Topics Concern   Not on file  Social History Narrative   Lives at home alone since spouse died in Jun 30, 2019, has neighbor for assistance   Right-handed   Drinks no caffeine   Social Determinants of Health   Financial Resource Strain: Not  on file  Food Insecurity: Not on file  Transportation Needs: Not on file  Physical Activity: Not on file  Stress: Not on file  Social Connections: Not on file  Intimate Partner Violence: Not on file     Diet: not addressed this visit   Exercise: not addressed this visit  Home BP readings: 120's-180's/60-80's  Wt Readings from Last 3 Encounters:  08/24/20 160 lb (72.6 kg)  08/01/20 164 lb (74.4 kg)  06/08/20 163 lb 3.2 oz (74 kg)   BP Readings from Last 3 Encounters:  08/24/20 (!) 158/70  08/01/20 (!) 165/67  06/08/20 140/80   Pulse Readings from Last 3 Encounters:  08/24/20 62  08/01/20 67  06/08/20 (!) 57    Renal function: CrCl cannot be calculated (Patient's most recent lab result is older than the maximum 21 days allowed.).  Past Medical History:  Diagnosis Date   Allergy    Arthritis    "got alot; especially in my spine"   Chest pain 02/2016   Chronic back pain    "all of it"   Classical migraine with  intractable migraine 02/25/2016   Diverticulosis of colon (without mention of hemorrhage)    DVT of leg (deep venous thrombosis) ([place]) 09/2011   LLE   Dysrhythmia    notes in epic   Esophageal reflux    Family history of malignant neoplasm of gastrointestinal tract    Fibromyalgia    "dx'd w/it when it first came out"   Hiatal hernia    Hyperlipidemia    Hypertension    Migraines 09/24/11   "often recently;  more controlled now"   Obesity, unspecified    Osteoporosis    Palpitations    event monitor 8/13: NSR, Sinus brady, Sinus Tachy, PAC // Event monitor 10/21: NSR, avg HR 63, no AFib/Flutter; no significant arrhythmia   Personal history of colonic polyps 05/31/2007   adenomatous polyp   Pneumonia    "3 times at least" (09/24/11)   Rheumatoid arthritis(714.0)    Scoliosis    Stricture and stenosis of esophagus    Stroke ([place]) 08/2016   Unspecified gastritis and gastroduodenitis without mention of hemorrhage     Current Outpatient Medications on File Prior to Visit  Medication Sig Dispense Refill   acetaminophen (TYLENOL) 500 MG tablet Take 500 mg by mouth daily as needed for moderate pain.     albuterol (PROVENTIL HFA;VENTOLIN HFA) 108 (90 Base) MCG/ACT inhaler Inhale 2 puffs into the lungs every 6 (six) hours as needed for wheezing or shortness of breath.     calcium carbonate (TUMS EX) 750 MG chewable tablet Chew 2 tablets by mouth daily as needed for heartburn.      cyanocobalamin (,VITAMIN B-12,) 1000 MCG/ML injection Inject 1,000 mcg into the muscle every 30 (thirty) days.     Dextran 70-Hypromellose ([employer] NATURAL TEARS OP) Apply 1-2 drops to eye daily as needed (dry eyes).     fluticasone (FLONASE) 50 MCG/ACT nasal spray Place 1 spray into both nostrils daily as needed for allergies.  1   hydrochlorothiazide (HYDRODIURIL) 25 MG tablet TAKE 1 TABLET(25 MG) BY MOUTH DAILY 90 tablet 3   lisinopril (ZESTRIL) 10 MG tablet Take 1 tablet (10 mg total) by mouth daily. 90 tablet 3    loratadine (CLARITIN) 10 MG tablet Take 10 mg by mouth daily as needed for allergies.     metoprolol tartrate (LOPRESSOR) 25 MG tablet TAKE 1 TABLET(25 MG) BY MOUTH TWICE DAILY 180 tablet 3  neomycin-bacitracin-polymyxin (NEOSPORIN) ointment Apply 1 application topically as needed for wound care.     pantoprazole (PROTONIX) 40 MG tablet TAKE 1 TABLET(40 MG) BY MOUTH TWICE DAILY BEFORE A MEAL 180 tablet 0   polyethylene glycol (MIRALAX / GLYCOLAX) packet Take 17 g by mouth daily as needed for mild constipation.     promethazine (PHENERGAN) 25 MG tablet Take 1 tablet (25 mg total) by mouth every 6 (six) hours as needed for nausea or vomiting. 30 tablet 1   rosuvastatin (CRESTOR) 5 MG tablet TAKE 1 TABLET BY MOUTH 3 TIMES A WEEK 36 tablet 2   topiramate (TOPAMAX) 25 MG tablet Take 2 tablets at night 180 tablet 3   Ubrogepant (UBRELVY) 50 MG TABS Take 50 mg by mouth 2 (two) times daily as needed. 10 tablet 3   No current facility-administered medications on file prior to visit.    Allergies  Allergen Reactions   Reglan [Metoclopramide] Other (See Comments)    Speech is garbled, extreme trouble making words   Pravastatin     Muscle cramps    There were no vitals taken for this visit.   Assessment/Plan:  1. Hypertension - Blood pressure above goal of <140/90 in clinic today. Patient does not take her medications at a consistent time each day. Sometimes she only takes 1/2 dose of lisinopril, even when BP is in the 140's. She has some readings in the 170's and 180's prior to taking medications. I have asked her to start taking her medications at a consistent time. AM medications at 10AM and PM medications at 10PM. Take lisinopril 10mg  daily and HCTZ 25mg  daily. I have asked her to decrease metoprolol to 12.5mg  BID due to HR in the 50's. Follow up in 1 month. Patient to bring her log book and her home BP cuff.   Thank you  Ramond Dial, Pharm.D, BCPS, CPP [facility]  [address] [place], [place] [address]  Phone: [phone]; Fax: [phone]

## 2021-05-03 DIAGNOSIS — E538 Deficiency of other specified B group vitamins: Secondary | ICD-10-CM | POA: Diagnosis not present

## 2021-05-09 ENCOUNTER — Ambulatory Visit: Payer: Medicare Other

## 2021-05-21 DIAGNOSIS — L821 Other seborrheic keratosis: Secondary | ICD-10-CM | POA: Diagnosis not present

## 2021-05-28 DIAGNOSIS — D492 Neoplasm of unspecified behavior of bone, soft tissue, and skin: Secondary | ICD-10-CM | POA: Diagnosis not present

## 2021-05-28 DIAGNOSIS — L820 Inflamed seborrheic keratosis: Secondary | ICD-10-CM | POA: Diagnosis not present

## 2021-05-28 DIAGNOSIS — L538 Other specified erythematous conditions: Secondary | ICD-10-CM | POA: Diagnosis not present

## 2021-05-30 ENCOUNTER — Ambulatory Visit: Payer: Medicare Other

## 2021-05-30 NOTE — Progress Notes (Deleted)
Patient ID: SANAM MARMO                 DOB: 12-20-1940                      MRN: [id number] ? ? ? ? ?HPI: ?[name] is a 81 y.o. female referred by Dr. Burt Knack to HTN clinic. PMH is significant for HTN, autonomic insufficiency, DVT, HLD and palpitations. At last OV 06-28-2020 Dr. Burt Knack had reviewed home BP readings and were on the low side so lisinopril was reduced to '10mg'$  daily. Last office BP readings have ranged 140-160s/50s-80s. She called the office on 12/16 afterhours asking for advise on BP as her blood pressure was 177/97 after getting her christmas tree out of the attic which made her emotional. She was last seen in the office on 04/15/21. Her blood pressure was 152/60. Home BP ranged from 120-180/60-80's. She was taking her medications at inconsistent times and sometimes only took 1/2 of a lisinopril. She was instructed to take her medications at the same time every day. No medication changes were made. ? ?Dizziness, lightheadedness, headache, blurred vision, SOB, swelling ?Home bp? ? ?Current HTN meds: metoprolol tartrate '25mg'$  twice a day, lisinopril '10mg'$  daily (PM), hydrochlorothiazide '25mg'$  daily (AM) ?Previously tried: diltiazem ?BP goal: <140/90 ? ?Family History: The patient's family history includes Colon cancer (age of onset: 3) in her sister; Diabetes in her father and sister; Hypertension in her father and mother; Pancreatic cancer in her sister and sister; Stroke in her father and mother. There is no history of Heart attack or Stomach cancer. ? ?Social History:  ?Social History  ? ?Socioeconomic History  ? Marital status: Widowed  ?  Spouse name: Not on file  ? Number of children: 1  ? 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 Highest education level: Not on file  ?Occupational History  ?  Employer: RETIRED  ?Tobacco Use  ? Smoking status: Never  ? Smokeless tobacco: Never  ?Vaping Use  ? Vaping Use: Never used  ?Substance and Sexual Activity  ? Alcohol use: No  ? Drug use: No  ? Sexual  activity: Not Currently  ?  Partners: Male  ?  Birth control/protection: Post-menopausal  ?Other Topics Concern  ? Not on file  ?Social History Narrative  ? Lives at home alone since spouse died in 06/29/19, has neighbor for assistance  ? Right-handed  ? Drinks no caffeine  ? ?Social Determinants of Health  ? ?Financial Resource Strain: Not on file  ?Food Insecurity: Not on file  ?Transportation Needs: Not on file  ?Physical Activity: Not on file  ?Stress: Not on file  ?Social Connections: Not on file  ?Intimate Partner Violence: Not on file  ? ? ? ?Diet: not addressed this visit  ? ?Exercise: not addressed this visit ? ?Home BP readings: 120's-180's/60-80's ? ?Wt Readings from Last 3 Encounters:  ?08/24/20 160 lb (72.6 kg)  ?08/01/20 164 lb (74.4 kg)  ?06/08/20 163 lb 3.2 oz (74 kg)  ? ?BP Readings from Last 3 Encounters:  ?04/15/21 (!) 152/60  ?08/24/20 (!) 158/70  ?08/01/20 (!) 165/67  ? ?Pulse Readings from Last 3 Encounters:  ?04/15/21 (!) 58  ?08/24/20 62  ?08/01/20 67  ? ? ?Renal function: ?CrCl cannot be calculated (Patient's most recent lab result is older than the maximum 21 days allowed.). ? ?Past Medical History:  ?Diagnosis Date  ? Allergy   ? Arthritis   ? "got alot; especially  in my spine"  ? Chest pain 02/2016  ? Chronic back pain   ? "all of it"  ? Classical migraine with intractable migraine 02/25/2016  ? Diverticulosis of colon (without mention of hemorrhage)   ? DVT of leg (deep venous thrombosis) ([place]) 09/2011  ? LLE  ? Dysrhythmia   ? notes in epic  ? Esophageal reflux   ? Family history of malignant neoplasm of gastrointestinal tract   ? Fibromyalgia   ? "dx'd w/it when it first came out"  ? Hiatal hernia   ? Hyperlipidemia   ? Hypertension   ? Migraines 09/24/11  ? "often recently;  more controlled now"  ? Obesity, unspecified   ? Osteoporosis   ? Palpitations   ? event monitor 8/13: NSR, Sinus brady, Sinus Tachy, PAC // Event monitor 10/21: NSR, avg HR 63, no AFib/Flutter; no significant  arrhythmia  ? Personal history of colonic polyps 05/31/2007  ? adenomatous polyp  ? Pneumonia   ? "3 times at least" (09/24/11)  ? Rheumatoid arthritis(714.0)   ? Scoliosis   ? Stricture and stenosis of esophagus   ? Stroke [facility]) 08/2016  ? Unspecified gastritis and gastroduodenitis without mention of hemorrhage   ? ? ?Current Outpatient Medications on File Prior to Visit  ?Medication Sig Dispense Refill  ? acetaminophen (TYLENOL) 500 MG tablet Take 500 mg by mouth daily as needed for moderate pain.    ? albuterol (PROVENTIL HFA;VENTOLIN HFA) 108 (90 Base) MCG/ACT inhaler Inhale 2 puffs into the lungs every 6 (six) hours as needed for wheezing or shortness of breath.    ? calcium carbonate (TUMS EX) 750 MG chewable tablet Chew 2 tablets by mouth daily as needed for heartburn.     ? cyanocobalamin (,VITAMIN B-12,) 1000 MCG/ML injection Inject 1,000 mcg into the muscle every 30 (thirty) days.    ? Dextran 70-Hypromellose ([employer] NATURAL TEARS OP) Apply 1-2 drops to eye daily as needed (dry eyes).    ? fluticasone (FLONASE) 50 MCG/ACT nasal spray Place 1 spray into both nostrils daily as needed for allergies.  1  ? hydrochlorothiazide (HYDRODIURIL) 25 MG tablet TAKE 1 TABLET(25 MG) BY MOUTH DAILY 90 tablet 3  ? lisinopril (ZESTRIL) 10 MG tablet Take 1 tablet (10 mg total) by mouth daily. 90 tablet 3  ? loratadine (CLARITIN) 10 MG tablet Take 10 mg by mouth daily as needed for allergies.    ? metoprolol tartrate (LOPRESSOR) 25 MG tablet Take 0.5 tablets (12.5 mg total) by mouth 2 (two) times daily. 45 tablet 3  ? neomycin-bacitracin-polymyxin (NEOSPORIN) ointment Apply 1 application topically as needed for wound care.    ? pantoprazole (PROTONIX) 40 MG tablet TAKE 1 TABLET(40 MG) BY MOUTH TWICE DAILY BEFORE A MEAL 180 tablet 0  ? polyethylene glycol (MIRALAX / GLYCOLAX) packet Take 17 g by mouth daily as needed for mild constipation.    ? promethazine (PHENERGAN) 25 MG tablet Take 1 tablet (25 mg total) by mouth every 6  (six) hours as needed for nausea or vomiting. 30 tablet 1  ? rosuvastatin (CRESTOR) 5 MG tablet TAKE 1 TABLET BY MOUTH 3 TIMES A WEEK 36 tablet 2  ? topiramate (TOPAMAX) 25 MG tablet Take 2 tablets at night 180 tablet 3  ? Ubrogepant (UBRELVY) 50 MG TABS Take 50 mg by mouth 2 (two) times daily as needed. 10 tablet 3  ? ?No current facility-administered medications on file prior to visit.  ? ? ?Allergies  ?Allergen Reactions  ? Reglan [Metoclopramide]  Other (See Comments)  ?  Speech is garbled, extreme trouble making words  ? Pravastatin   ?  Muscle cramps  ? ? ?There were no vitals taken for this visit. ? ? ?Assessment/Plan: ? ?1. Hypertension - Blood pressure above goal of <140/90 in clinic today. Patient does not take her medications at a consistent time each day. Sometimes she only takes 1/2 dose of lisinopril, even when BP is in the 140's. She has some readings in the 170's and 180's prior to taking medications. I have asked her to start taking her medications at a consistent time. AM medications at 10AM and PM medications at 10PM. Take lisinopril '10mg'$  daily and HCTZ '25mg'$  daily. I have asked her to decrease metoprolol to 12.'5mg'$  BID due to HR in the 50's. Follow up in 1 month. Patient to bring her log book and her home BP cuff. ? ? ?Thank you ? ?Ramond Dial, Pharm.D, BCPS, CPP ?[id number], [place],  [address]  ?Phone: [phone]; Fax: [phone]  ? ? ?

## 2021-06-04 DIAGNOSIS — E538 Deficiency of other specified B group vitamins: Secondary | ICD-10-CM | POA: Diagnosis not present

## 2021-06-05 ENCOUNTER — Ambulatory Visit: Payer: Medicare Other | Admitting: Pharmacist

## 2021-06-05 NOTE — Progress Notes (Deleted)
Patient ID: [name]                 DOB: 28-May-1940                      MRN: [id number] ? ? ? ? ?HPI: ?[name] is a 81 y.o. female referred by Dr. Burt Knack to HTN clinic. PMH is significant for HTN, autonomic insufficiency, DVT, HLD and palpitations. At last OV Jul 14, 2020 Dr. Burt Knack had reviewed home BP readings and were on the low side so lisinopril was reduced to '10mg'$  daily. Last office BP readings have ranged 140-160s/50s-80s. She called the office on 02/22/21 with fluctuating BP 114/72 - 177/96. She was referred to HTN clinic and seen on 04/15/21 where BP was 152/60. Wasn't taking meds at a consistent time and occasionally took 1/2 doses of meds. Was advised to take meds at consistent times and metoprolol was decreased to 12.'5mg'$  BID due to HR in the 50s. ? ?Patient presents today to HTN clinic. She brings in very detailed log of blood pressure and what medications she has taken. She takes her BP 3-4 times a day. She goes to bed very late (2-3AM). Takes her second metoprolol and her lisinopril at this time. She sometimes only takes 1/2 tablet because they thought her medications needed to be 12 hours apart. If she thought her BP was low or she took her AM medications late, she would only take 1/2 tablet.  ? ?Change metop to toprol 25 daily if wanted ?Inc lisin if needed ?Confirm timing and not taking 1/2 doses ? ?Diet and exercise ?Dizziness? Why was bp goal set at 140/90 ? ?Current HTN meds: metoprolol tartrate 12.'5mg'$  BID, lisinopril '10mg'$  daily (10PM), HCTZ '25mg'$  daily (10AM) ?Previously tried: diltiazem ?BP goal: <130/10mHg ? ?Diet:  ? ?Exercise:  ? ?Home BP readings:  ? ?Family History: The patient's family history includes Colon cancer (age of onset: 651 in her sister; Diabetes in her father and sister; Hypertension in her father and mother; Pancreatic cancer in her sister and sister; Stroke in her father and mother. There is no history of Heart attack or Stomach cancer. ? ?Social History:  ?Social  History  ? ?Socioeconomic History  ? Marital status: Widowed  ?  Spouse name: Not on file  ? Number of children: 1  ? Years of education: [place] [employer]  ? Highest education level: Not on file  ?Occupational History  ?  Employer: RETIRED  ?Tobacco Use  ? Smoking status: Never  ? Smokeless tobacco: Never  ?Vaping Use  ? Vaping Use: Never used  ?Substance and Sexual Activity  ? Alcohol use: No  ? Drug use: No  ? Sexual activity: Not Currently  ?  Partners: Male  ?  Birth control/protection: Post-menopausal  ?Other Topics Concern  ? Not on file  ?Social History Narrative  ? Lives at home alone since spouse died in 405/09/2019 has neighbor for assistance  ? Right-handed  ? Drinks no caffeine  ? ?Social Determinants of Health  ? ?Financial Resource Strain: Not on file  ?Food Insecurity: Not on file  ?Transportation Needs: Not on file  ?Physical Activity: Not on file  ?Stress: Not on file  ?Social Connections: Not on file  ?Intimate Partner Violence: Not on file  ? ? ?Wt Readings from Last 3 Encounters:  ?08/24/20 160 lb (72.6 kg)  ?08/01/20 164 lb (74.4 kg)  ?06/08/20 163 lb 3.2 oz (74 kg)  ? ?BP Readings from Last  3 Encounters:  ?04/15/21 (!) 152/60  ?08/24/20 (!) 158/70  ?08/01/20 (!) 165/67  ? ?Pulse Readings from Last 3 Encounters:  ?04/15/21 (!) 58  ?08/24/20 62  ?08/01/20 67  ? ? ?Renal function: ?CrCl cannot be calculated (Patient's most recent lab result is older than the maximum 21 days allowed.). ? ?Past Medical History:  ?Diagnosis Date  ? Allergy   ? Arthritis   ? "got alot; especially in my spine"  ? Chest pain 02/2016  ? Chronic back pain   ? "all of it"  ? Classical migraine with intractable migraine 02/25/2016  ? Diverticulosis of colon (without mention of hemorrhage)   ? DVT of leg (deep venous thrombosis) ([place]) 09/2011  ? LLE  ? Dysrhythmia   ? notes in epic  ? Esophageal reflux   ? Family history of malignant neoplasm of gastrointestinal tract   ? Fibromyalgia   ? "dx'd w/it when it first came out"  ?  Hiatal hernia   ? Hyperlipidemia   ? Hypertension   ? Migraines 09/24/11  ? "often recently;  more controlled now"  ? Obesity, unspecified   ? Osteoporosis   ? Palpitations   ? event monitor 8/13: NSR, Sinus brady, Sinus Tachy, PAC // Event monitor 10/21: NSR, avg HR 63, no AFib/Flutter; no significant arrhythmia  ? Personal history of colonic polyps 05/31/2007  ? adenomatous polyp  ? Pneumonia   ? "3 times at least" (09/24/11)  ? Rheumatoid arthritis(714.0)   ? Scoliosis   ? Stricture and stenosis of esophagus   ? Stroke [facility]) 08/2016  ? Unspecified gastritis and gastroduodenitis without mention of hemorrhage   ? ? ?Current Outpatient Medications on File Prior to Visit  ?Medication Sig Dispense Refill  ? acetaminophen (TYLENOL) 500 MG tablet Take 500 mg by mouth daily as needed for moderate pain.    ? albuterol (PROVENTIL HFA;VENTOLIN HFA) 108 (90 Base) MCG/ACT inhaler Inhale 2 puffs into the lungs every 6 (six) hours as needed for wheezing or shortness of breath.    ? calcium carbonate (TUMS EX) 750 MG chewable tablet Chew 2 tablets by mouth daily as needed for heartburn.     ? cyanocobalamin (,VITAMIN B-12,) 1000 MCG/ML injection Inject 1,000 mcg into the muscle every 30 (thirty) days.    ? Dextran 70-Hypromellose ([employer] NATURAL TEARS OP) Apply 1-2 drops to eye daily as needed (dry eyes).    ? fluticasone (FLONASE) 50 MCG/ACT nasal spray Place 1 spray into both nostrils daily as needed for allergies.  1  ? hydrochlorothiazide (HYDRODIURIL) 25 MG tablet TAKE 1 TABLET(25 MG) BY MOUTH DAILY 90 tablet 3  ? lisinopril (ZESTRIL) 10 MG tablet Take 1 tablet (10 mg total) by mouth daily. 90 tablet 3  ? loratadine (CLARITIN) 10 MG tablet Take 10 mg by mouth daily as needed for allergies.    ? metoprolol tartrate (LOPRESSOR) 25 MG tablet Take 0.5 tablets (12.5 mg total) by mouth 2 (two) times daily. 45 tablet 3  ? neomycin-bacitracin-polymyxin (NEOSPORIN) ointment Apply 1 application topically as needed for wound care.    ?  pantoprazole (PROTONIX) 40 MG tablet TAKE 1 TABLET(40 MG) BY MOUTH TWICE DAILY BEFORE A MEAL 180 tablet 0  ? polyethylene glycol (MIRALAX / GLYCOLAX) packet Take 17 g by mouth daily as needed for mild constipation.    ? promethazine (PHENERGAN) 25 MG tablet Take 1 tablet (25 mg total) by mouth every 6 (six) hours as needed for nausea or vomiting. 30 tablet 1  ? rosuvastatin (  CRESTOR) 5 MG tablet TAKE 1 TABLET BY MOUTH 3 TIMES A WEEK 36 tablet 2  ? topiramate (TOPAMAX) 25 MG tablet Take 2 tablets at night 180 tablet 3  ? Ubrogepant (UBRELVY) 50 MG TABS Take 50 mg by mouth 2 (two) times daily as needed. 10 tablet 3  ? ?No current facility-administered medications on file prior to visit.  ? ? ?Allergies  ?Allergen Reactions  ? Reglan [Metoclopramide] Other (See Comments)  ?  Speech is garbled, extreme trouble making words  ? Pravastatin   ?  Muscle cramps  ? ? ?There were no vitals taken for this visit. ? ? ?Assessment/Plan: ? ?1. Hypertension - Blood pressure above goal of <140/90 in clinic today. Patient does not take her medications at a consistent time each day. Sometimes she only takes 1/2 dose of lisinopril, even when BP is in the 140's. She has some readings in the 170's and 180's prior to taking medications. I have asked her to start taking her medications at a consistent time. AM medications at 10AM and PM medications at 10PM. Take lisinopril '10mg'$  daily and HCTZ '25mg'$  daily. I have asked her to decrease metoprolol to 12.'5mg'$  BID due to HR in the 50's. Follow up in 1 month. Patient to bring her log book and her home BP cuff. ? ? ? ?

## 2021-06-30 ENCOUNTER — Other Ambulatory Visit: Payer: Self-pay | Admitting: Cardiovascular Disease

## 2021-07-02 ENCOUNTER — Other Ambulatory Visit: Payer: Self-pay | Admitting: Cardiovascular Disease

## 2021-07-02 DIAGNOSIS — R7303 Prediabetes: Secondary | ICD-10-CM | POA: Diagnosis not present

## 2021-07-02 DIAGNOSIS — E669 Obesity, unspecified: Secondary | ICD-10-CM | POA: Diagnosis not present

## 2021-07-02 DIAGNOSIS — E539 Vitamin B deficiency, unspecified: Secondary | ICD-10-CM | POA: Diagnosis not present

## 2021-07-02 DIAGNOSIS — N1831 Chronic kidney disease, stage 3a: Secondary | ICD-10-CM | POA: Diagnosis not present

## 2021-07-02 NOTE — Progress Notes (Signed)
Patient ID: [name]                 DOB: 1940/07/08                      MRN: [id number] ? ? ? ? ?HPI: ?[name] is a 81 y.o. female referred by Dr. Burt Knack to HTN clinic. PMH is significant for HTN, autonomic insufficiency, DVT, HLD and palpitations. At last OV 06/2020 Dr. Burt Knack had reviewed home BP readings and were on the low side so lisinopril was reduced to '10mg'$  daily. Last office BP readings have ranged 140-160s/50s-80s. She called the office on 02/22/21 with elevated BP readings. Admitted that she sometimes holds lisinopril at night when DBP is in the 50's. She was referred to HTN clinic.  ? ?Saw Melissa on 04/15/21 and stated she only took 1/2 tablet of her lisinopril if she thought her BP was low or she took her AM meds late. Also stated she was not taking her HCTZ and first metoprolol dose at a consistent time. Pt said she was afraid of her DBP dropping too low. Metoprolol was decreased to 12.'5mg'$  BID due to HR in the 50s. Pt advised to start taking her meds at a consistent time (10AM and 10PM) and to bring in home readings + cuff to next appt. ? ?Pt presents today for HTN f/u appt. She thinks she took all of her medications last night and this morning prior to visit. She did start taking the lower metoprolol tartrate 12.'5mg'$  dose after last visit. She takes HCTZ and first dose of metoprolol in the morning (10AM) and the second dose of metoprolol and lisinopril around 1AM. States 10PM does not work well with her schedule. Pt believed DBP was the most important and continues to skip her evening lisinopril if her DBP is < 60. Initially unclear how frequently she is doing this, eventually by the end of her visit seems like she skips her dose at least 3x a week still even when her SBP is normal/high. She notices occasional dizziness/lightheadedness when she bends over, but denies any change in her baseline. She is very careful and holds onto something when she bends over to help prevent falls. Denies  blurred vision or headaches. No falls. States she can feel pumping in her ears when her blood pressure is running high.  ? ?Home BP readings:  ?April readings: 127/59, 184/95, 141/68, 175/77, 147/72, 156/75, 142/57, 149/77, 132/63, 132/62, 159/85, 134/57, 164/72, 151/76 ?Home cuff in clinic: 197/91, 192/86 on recheck ?Clinic cuff reading: 188/80, 180/82 on recheck ? ?Current HTN meds: metoprolol tartrate 12.'5mg'$  twice a day, lisinopril '10mg'$  daily, hydrochlorothiazide '25mg'$  daily ? ?BP goal: <140/90 mmHg ? ?Family History: HTN (father, mother), stroke (father, mother) ? ?Social History: Denies tobacco, illicit drug, and alcohol use ? ?Diet: Does not add extra salt to food. No frozen meals. Buys no salt and frozen vegetables ? ?Wt Readings from Last 3 Encounters:  ?08/24/20 160 lb (72.6 kg)  ?08/01/20 164 lb (74.4 kg)  ?06/08/20 163 lb 3.2 oz (74 kg)  ? ?BP Readings from Last 3 Encounters:  ?04/15/21 (!) 152/60  ?08/24/20 (!) 158/70  ?08/01/20 (!) 165/67  ? ?Pulse Readings from Last 3 Encounters:  ?04/15/21 (!) 58  ?08/24/20 62  ?08/01/20 67  ? ? ?Renal function: ?CrCl cannot be calculated (Patient's most recent lab result is older than the maximum 21 days allowed.). ? ?Past Medical History:  ?Diagnosis Date  ? Allergy   ?  Arthritis   ? "got alot; especially in my spine"  ? Chest pain 02/2016  ? Chronic back pain   ? "all of it"  ? Classical migraine with intractable migraine 02/25/2016  ? Diverticulosis of colon (without mention of hemorrhage)   ? DVT of leg (deep venous thrombosis) ([place]) 09/2011  ? LLE  ? Dysrhythmia   ? notes in epic  ? Esophageal reflux   ? Family history of malignant neoplasm of gastrointestinal tract   ? Fibromyalgia   ? "dx'd w/it when it first came out"  ? Hiatal hernia   ? Hyperlipidemia   ? Hypertension   ? Migraines 09/24/11  ? "often recently;  more controlled now"  ? Obesity, unspecified   ? Osteoporosis   ? Palpitations   ? event monitor 8/13: NSR, Sinus brady, Sinus Tachy, PAC // Event  monitor 10/21: NSR, avg HR 63, no AFib/Flutter; no significant arrhythmia  ? Personal history of colonic polyps 05/31/2007  ? adenomatous polyp  ? Pneumonia   ? "3 times at least" (09/24/11)  ? Rheumatoid arthritis(714.0)   ? Scoliosis   ? Stricture and stenosis of esophagus   ? Stroke [facility]) 08/2016  ? Unspecified gastritis and gastroduodenitis without mention of hemorrhage   ? ?Current Outpatient Medications on File Prior to Visit  ?Medication Sig Dispense Refill  ? acetaminophen (TYLENOL) 500 MG tablet Take 500 mg by mouth daily as needed for moderate pain.    ? albuterol (PROVENTIL HFA;VENTOLIN HFA) 108 (90 Base) MCG/ACT inhaler Inhale 2 puffs into the lungs every 6 (six) hours as needed for wheezing or shortness of breath.    ? calcium carbonate (TUMS EX) 750 MG chewable tablet Chew 2 tablets by mouth daily as needed for heartburn.     ? cyanocobalamin (,VITAMIN B-12,) 1000 MCG/ML injection Inject 1,000 mcg into the muscle every 30 (thirty) days.    ? Dextran 70-Hypromellose ([employer] NATURAL TEARS OP) Apply 1-2 drops to eye daily as needed (dry eyes).    ? fluticasone (FLONASE) 50 MCG/ACT nasal spray Place 1 spray into both nostrils daily as needed for allergies.  1  ? hydrochlorothiazide (HYDRODIURIL) 25 MG tablet TAKE 1 TABLET(25 MG) BY MOUTH DAILY 90 tablet 3  ? lisinopril (ZESTRIL) 10 MG tablet TAKE 1 TABLET(10 MG) BY MOUTH DAILY 30 tablet 0  ? loratadine (CLARITIN) 10 MG tablet Take 10 mg by mouth daily as needed for allergies.    ? metoprolol tartrate (LOPRESSOR) 25 MG tablet Take 0.5 tablets (12.5 mg total) by mouth 2 (two) times daily. 45 tablet 3  ? neomycin-bacitracin-polymyxin (NEOSPORIN) ointment Apply 1 application topically as needed for wound care.    ? pantoprazole (PROTONIX) 40 MG tablet TAKE 1 TABLET(40 MG) BY MOUTH TWICE DAILY BEFORE A MEAL 180 tablet 0  ? polyethylene glycol (MIRALAX / GLYCOLAX) packet Take 17 g by mouth daily as needed for mild constipation.    ? promethazine (PHENERGAN) 25 MG  tablet Take 1 tablet (25 mg total) by mouth every 6 (six) hours as needed for nausea or vomiting. 30 tablet 1  ? rosuvastatin (CRESTOR) 5 MG tablet TAKE 1 TABLET BY MOUTH 3 TIMES A WEEK 36 tablet 2  ? topiramate (TOPAMAX) 25 MG tablet Take 2 tablets at night 180 tablet 3  ? Ubrogepant (UBRELVY) 50 MG TABS Take 50 mg by mouth 2 (two) times daily as needed. 10 tablet 3  ? ?No current facility-administered medications on file prior to visit.  ? ? ?Allergies  ?Allergen Reactions  ?  Reglan [Metoclopramide] Other (See Comments)  ?  Speech is garbled, extreme trouble making words  ? Pravastatin   ?  Muscle cramps  ? ?Assessment/Plan: ? ?1. Hypertension - BP 180/82 in clinic is elevated above goal <140/90, clinic reading notably higher than all home readings and home cuff is measuring accurately. Higher goal is appropriate for pt given her age and balance. Today will again focus on pt taking her medications as prescribed. Needs to take her lisinopril every day and stop skipping doses if her diastolic BP is in the upper 50s like she has been multiple days a week. Advised she should skip a dose of lisinopril only if SBP is < 110. Otherwise, continue lisinopril '10mg'$  daily, metoprolol tartrate 12.'5mg'$  BID, and HCTZ '25mg'$  daily. Pt will continue checking her home blood pressure readings and bring them to her next appointment scheduled in a month. ? ?Patient seen with Debria Garret, [employer] pharmacy student ? ?Megan E. Supple, PharmD, BCACP, CPP ?[facility], [place], [place] [address] ?Phone: [phone]; Fax: [phone] ?07/03/2021 4:40 PM ? ?

## 2021-07-03 ENCOUNTER — Ambulatory Visit: Payer: Medicare Other | Admitting: Pharmacist

## 2021-07-03 VITALS — BP 180/82 | HR 65

## 2021-07-03 DIAGNOSIS — I10 Essential (primary) hypertension: Secondary | ICD-10-CM

## 2021-07-03 NOTE — Patient Instructions (Addendum)
Your blood pressure goal is <140/90 ? ?Continue taking lisinopril '10mg'$  daily, metoprolol tartrate 12.'5mg'$  twice daily, and hydrochlorothiazide '25mg'$  daily ? ?Do not skip lisinopril dose even if your bottom blood pressure number is less than 60 ? ?Only skip your lisinopril if your top blood pressure number is less than 110 ? ?Continue to check your blood pressure at home and write down your readings. ?Will follow-up in 1 month on 07/31/21 at 3:30PM ? ? ? ? ?

## 2021-07-30 NOTE — Progress Notes (Unsigned)
Patient ID: [name]                 DOB: 1940/06/04                      MRN: [id number]     HPI: [name] is a 81 y.o. female referred by Dr. Burt Knack to HTN clinic. PMH is significant for HTN, autonomic insufficiency, DVT, HLD and palpitations. At last OV 06/2020 Dr. Burt Knack had reviewed home BP readings and were on the low side so lisinopril was reduced to '10mg'$  daily. Last office BP readings have ranged 140-160s/50s-80s. She called the office on 02/22/21 with elevated BP readings. Admitted that she sometimes holds lisinopril at night when DBP is in the 50's. She was referred to HTN clinic.   Saw Melissa on 04/15/21 and stated she only took 1/2 tablet of her lisinopril if she thought her BP was low or she took her AM meds late. Also stated she was not taking her HCTZ and first metoprolol dose at a consistent time. Pt said she was afraid of her DBP dropping too low. Metoprolol was decreased to 12.'5mg'$  BID due to HR in the 50s. Pt advised to start taking her meds at a consistent time (10AM and 10PM) and to bring in home readings + cuff to next appt.  Pt presents today for HTN f/u appt. She thinks she took all of her medications last night and this morning prior to visit. She did start taking the lower metoprolol tartrate 12.'5mg'$  dose after last visit. She takes HCTZ and first dose of metoprolol in the morning (10AM) and the second dose of metoprolol and lisinopril around 1AM. States 10PM does not work well with her schedule. Pt believed DBP was the most important and continues to skip her evening lisinopril if her DBP is < 60. Initially unclear how frequently she is doing this, eventually by the end of her visit seems like she skips her dose at least 3x a week still even when her SBP is normal/high. She notices occasional dizziness/lightheadedness when she bends over, but denies any change in her baseline. She is very careful and holds onto something when she bends over to help prevent falls. Denies  blurred vision or headaches. No falls. States she can feel pumping in her ears when her blood pressure is running high.   Home BP readings:  April readings: 127/59, 184/95, 141/68, 175/77, 147/72, 156/75, 142/57, 149/77, 132/63, 132/62, 159/85, 134/57, 164/72, 151/76 Home cuff in clinic: 197/91, 192/86 on recheck Clinic cuff reading: 188/80, 180/82 on recheck  Current HTN meds: metoprolol tartrate 12.'5mg'$  twice a day, lisinopril '10mg'$  daily, hydrochlorothiazide '25mg'$  daily  BP goal: <140/90 mmHg  Family History: HTN (father, mother), stroke (father, mother)  Social History: Denies tobacco, illicit drug, and alcohol use  Diet: Does not add extra salt to food. No frozen meals. Buys no salt and frozen vegetables  Wt Readings from Last 3 Encounters:  08/24/20 160 lb (72.6 kg)  08/01/20 164 lb (74.4 kg)  06/08/20 163 lb 3.2 oz (74 kg)   BP Readings from Last 3 Encounters:  07/03/21 (!) 180/82  04/15/21 (!) 152/60  08/24/20 (!) 158/70   Pulse Readings from Last 3 Encounters:  07/03/21 65  04/15/21 (!) 58  08/24/20 62    Renal function: CrCl cannot be calculated (Patient's most recent lab result is older than the maximum 21 days allowed.).  Past Medical History:  Diagnosis Date   Allergy  Arthritis    "got alot; especially in my spine"   Chest pain 02/2016   Chronic back pain    "all of it"   Classical migraine with intractable migraine 02/25/2016   Diverticulosis of colon (without mention of hemorrhage)    DVT of leg (deep venous thrombosis) ([place]) 09/2011   LLE   Dysrhythmia    notes in epic   Esophageal reflux    Family history of malignant neoplasm of gastrointestinal tract    Fibromyalgia    "dx'd w/it when it first came out"   Hiatal hernia    Hyperlipidemia    Hypertension    Migraines 09/24/11   "often recently;  more controlled now"   Obesity, unspecified    Osteoporosis    Palpitations    event monitor 8/13: NSR, Sinus brady, Sinus Tachy, PAC // Event  monitor 10/21: NSR, avg HR 63, no AFib/Flutter; no significant arrhythmia   Personal history of colonic polyps 05/31/2007   adenomatous polyp   Pneumonia    "3 times at least" (09/24/11)   Rheumatoid arthritis(714.0)    Scoliosis    Stricture and stenosis of esophagus    Stroke ([place]) 08/2016   Unspecified gastritis and gastroduodenitis without mention of hemorrhage    Current Outpatient Medications on File Prior to Visit  Medication Sig Dispense Refill   acetaminophen (TYLENOL) 500 MG tablet Take 500 mg by mouth daily as needed for moderate pain.     albuterol (PROVENTIL HFA;VENTOLIN HFA) 108 (90 Base) MCG/ACT inhaler Inhale 2 puffs into the lungs every 6 (six) hours as needed for wheezing or shortness of breath.     calcium carbonate (TUMS EX) 750 MG chewable tablet Chew 2 tablets by mouth daily as needed for heartburn.      cyanocobalamin (,VITAMIN B-12,) 1000 MCG/ML injection Inject 1,000 mcg into the muscle every 30 (thirty) days.     Dextran 70-Hypromellose ([employer] NATURAL TEARS OP) Apply 1-2 drops to eye daily as needed (dry eyes).     fluticasone (FLONASE) 50 MCG/ACT nasal spray Place 1 spray into both nostrils daily as needed for allergies.  1   hydrochlorothiazide (HYDRODIURIL) 25 MG tablet TAKE 1 TABLET(25 MG) BY MOUTH DAILY 90 tablet 3   lisinopril (ZESTRIL) 10 MG tablet TAKE 1 TABLET(10 MG) BY MOUTH DAILY 30 tablet 0   loratadine (CLARITIN) 10 MG tablet Take 10 mg by mouth daily as needed for allergies.     metoprolol tartrate (LOPRESSOR) 25 MG tablet Take 0.5 tablets (12.5 mg total) by mouth 2 (two) times daily. 45 tablet 3   neomycin-bacitracin-polymyxin (NEOSPORIN) ointment Apply 1 application topically as needed for wound care.     pantoprazole (PROTONIX) 40 MG tablet TAKE 1 TABLET(40 MG) BY MOUTH TWICE DAILY BEFORE A MEAL 180 tablet 0   polyethylene glycol (MIRALAX / GLYCOLAX) packet Take 17 g by mouth daily as needed for mild constipation.     promethazine (PHENERGAN) 25 MG  tablet Take 1 tablet (25 mg total) by mouth every 6 (six) hours as needed for nausea or vomiting. 30 tablet 1   rosuvastatin (CRESTOR) 5 MG tablet TAKE 1 TABLET BY MOUTH 3 TIMES A WEEK 36 tablet 2   topiramate (TOPAMAX) 25 MG tablet Take 2 tablets at night 180 tablet 3   Ubrogepant (UBRELVY) 50 MG TABS Take 50 mg by mouth 2 (two) times daily as needed. 10 tablet 3   No current facility-administered medications on file prior to visit.    Allergies  Allergen Reactions  Reglan [Metoclopramide] Other (See Comments)    Speech is garbled, extreme trouble making words   Pravastatin     Muscle cramps   Assessment/Plan:  1. Hypertension - BP 180/82 in clinic is elevated above goal <140/90, clinic reading notably higher than all home readings and home cuff is measuring accurately. Higher goal is appropriate for pt given her age and balance. Today will again focus on pt taking her medications as prescribed. Needs to take her lisinopril every day and stop skipping doses if her diastolic BP is in the upper 50s like she has been multiple days a week. Advised she should skip a dose of lisinopril only if SBP is < 110. Otherwise, continue lisinopril '10mg'$  daily, metoprolol tartrate 12.'5mg'$  BID, and HCTZ '25mg'$  daily. Pt will continue checking her home blood pressure readings and bring them to her next appointment scheduled in a month.  Patient seen with Debria Garret, [facility] pharmacy student  Harlon Flor. Supple, PharmD, BCACP, [place] [place] [address], [place], [place] [address] Phone: [phone]; Fax: [phone] 07/30/2021 6:30 PM

## 2021-07-31 ENCOUNTER — Ambulatory Visit: Payer: Medicare Other | Admitting: Pharmacist

## 2021-07-31 VITALS — BP 178/80

## 2021-07-31 DIAGNOSIS — I10 Essential (primary) hypertension: Secondary | ICD-10-CM

## 2021-07-31 NOTE — Patient Instructions (Addendum)
It was great to see you today!  Continue taking your blood pressure medications as prescribed. If your blood pressure is less than 110 on the top or less than 55 on the bottom numbers you may take 1/2 dose of your scheduled medication. If your blood pressure is consistently less than 110/55, give [place] a call.   Continue taking metoprolol tartrate 12.5 mg (1/2 tablet) every 12 hours for your heart rate, lisinopril 10 mg daily, and hydrochlorothiazide 25 mg daily.   Let us know if you have any questions or concerns. Your home blood pressures look controlled for your goal less than 140/90. Great job!

## 2021-08-02 DIAGNOSIS — E538 Deficiency of other specified B group vitamins: Secondary | ICD-10-CM | POA: Diagnosis not present

## 2021-08-06 ENCOUNTER — Ambulatory Visit: Payer: Medicare Other | Admitting: Neurology

## 2021-08-12 NOTE — Progress Notes (Unsigned)
PATIENT: [name] DOB: 1940-11-08  REASON FOR VISIT: follow up HISTORY FROM: patient PRIMARY NEUROLOGIST: Dr. Jannifer Franklin    HISTORY OF PRESENT ILLNESS: Today 08/12/21 [name]   Update 08/01/2020 SS: [name] is a 81 year old female with history of migraine headache. Is on Topamax, takes Roselyn Meier as needed for acute headache. Is not a candidate for triptans use due to HTN, some degree of SVD by MRI of the brain. Has taken Ubrelvy 3 times, works well and quickly to abort migraine. Health has been good, watching her sugars, lost 6 lbs since last seen, over 2 years lost 40 lbs. Lives alone, drives a car. Has supportive family and church. Her husband passed away in 14-Jul-2019. All she takes Phenergan for acute headache due to nausea.  Here today for evaluation unaccompanied.  HISTORY 02/01/2020 Dr. Jannifer Franklin: [name] is a 81 year old right-handed white female with a history of migraine headache.  The patient usually has very few headaches, but recently she has been under stress after she lost her husband.  She was placed on Topamax, she is taking 50 mg at night.  She went to the emergency room on 13 December 2019 with a bad headache.  The patient really has not had any significant problems with headaches since that time.  She has Imitrex to take for her headaches if needed, but she does have a history of hypertension and she has some degree of small vessel disease by MRI of the brain.  The patient is reporting some intermittent tingling of the left hand, this may occur at nighttime, or while driving.  She returns here for further evaluation.   REVIEW OF SYSTEMS: Out of a complete 14 system review of symptoms, the patient complains only of the following symptoms, and all other reviewed systems are negative.  Headache   ALLERGIES: Allergies  Allergen Reactions   Reglan [Metoclopramide] Other (See Comments)    Speech is garbled, extreme trouble making words   Pravastatin      Muscle cramps    HOME MEDICATIONS: Outpatient Medications Prior to Visit  Medication Sig Dispense Refill   acetaminophen (TYLENOL) 500 MG tablet Take 500 mg by mouth daily as needed for moderate pain.     albuterol (PROVENTIL HFA;VENTOLIN HFA) 108 (90 Base) MCG/ACT inhaler Inhale 2 puffs into the lungs every 6 (six) hours as needed for wheezing or shortness of breath.     calcium carbonate (TUMS EX) 750 MG chewable tablet Chew 2 tablets by mouth daily as needed for heartburn.      cyanocobalamin (,VITAMIN B-12,) 1000 MCG/ML injection Inject 1,000 mcg into the muscle every 30 (thirty) days.     Dextran 70-Hypromellose ([employer] NATURAL TEARS OP) Apply 1-2 drops to eye daily as needed (dry eyes).     fluticasone (FLONASE) 50 MCG/ACT nasal spray Place 1 spray into both nostrils daily as needed for allergies.  1   hydrochlorothiazide (HYDRODIURIL) 25 MG tablet TAKE 1 TABLET(25 MG) BY MOUTH DAILY 90 tablet 3   lisinopril (ZESTRIL) 10 MG tablet TAKE 1 TABLET(10 MG) BY MOUTH DAILY 30 tablet 0   loratadine (CLARITIN) 10 MG tablet Take 10 mg by mouth daily as needed for allergies.     metoprolol tartrate (LOPRESSOR) 25 MG tablet Take 0.5 tablets (12.5 mg total) by mouth 2 (two) times daily. 45 tablet 3   neomycin-bacitracin-polymyxin (NEOSPORIN) ointment Apply 1 application topically as needed for wound care.     pantoprazole (PROTONIX) 40 MG tablet TAKE 1  TABLET(40 MG) BY MOUTH TWICE DAILY BEFORE A MEAL 180 tablet 0   polyethylene glycol (MIRALAX / GLYCOLAX) packet Take 17 g by mouth daily as needed for mild constipation.     promethazine (PHENERGAN) 25 MG tablet Take 1 tablet (25 mg total) by mouth every 6 (six) hours as needed for nausea or vomiting. 30 tablet 1   rosuvastatin (CRESTOR) 5 MG tablet TAKE 1 TABLET BY MOUTH 3 TIMES A WEEK 36 tablet 2   topiramate (TOPAMAX) 25 MG tablet Take 2 tablets at night 180 tablet 3   Ubrogepant (UBRELVY) 50 MG TABS Take 50 mg by mouth 2 (two) times daily as needed. 10  tablet 3   No facility-administered medications prior to visit.    PAST MEDICAL HISTORY: Past Medical History:  Diagnosis Date   Allergy    Arthritis    "got alot; especially in my spine"   Chest pain 02/2016   Chronic back pain    "all of it"   Classical migraine with intractable migraine 02/25/2016   Diverticulosis of colon (without mention of hemorrhage)    DVT of leg (deep venous thrombosis) ([place]) 09/2011   LLE   Dysrhythmia    notes in epic   Esophageal reflux    Family history of malignant neoplasm of gastrointestinal tract    Fibromyalgia    "dx'd w/it when it first came out"   Hiatal hernia    Hyperlipidemia    Hypertension    Migraines 09/24/11   "often recently;  more controlled now"   Obesity, unspecified    Osteoporosis    Palpitations    event monitor 8/13: NSR, Sinus brady, Sinus Tachy, PAC // Event monitor 10/21: NSR, avg HR 63, no AFib/Flutter; no significant arrhythmia   Personal history of colonic polyps 05/31/2007   adenomatous polyp   Pneumonia    "3 times at least" (09/24/11)   Rheumatoid arthritis(714.0)    Scoliosis    Stricture and stenosis of esophagus    Stroke ([place]) 08/2016   Unspecified gastritis and gastroduodenitis without mention of hemorrhage     PAST SURGICAL HISTORY: Past Surgical History:  Procedure Laterality Date   BACK SURGERY     BREAST CYST INCISION AND DRAINAGE  2012   left; "golf-ball sized"   BREAST SURGERY     cyst lanced and drainded   COLON SURGERY     COLONOSCOPY  2009   COLONOSCOPY WITH PROPOFOL N/A 03/16/2017   Procedure: COLONOSCOPY WITH PROPOFOL;  Surgeon: Gatha Mayer, MD;  Location: [facility];  Service: Endoscopy;  Laterality: N/A;   ESOPHAGEAL DILATION     "several times"   ESOPHAGOGASTRODUODENOSCOPY (EGD) WITH PROPOFOL N/A 03/16/2017   Procedure: ESOPHAGOGASTRODUODENOSCOPY (EGD) WITH PROPOFOL;  Surgeon: Gatha Mayer, MD;  Location: [facility];  Service: Endoscopy;  Laterality: N/A;   EXPLORATORY  LAPAROTOMY  04/2000   "for ruptured colon"   HEMATOMA EVACUATION  09/2011   LLE   LUMBAR LAMINECTOMY/DECOMPRESSION MICRODISCECTOMY  09/03/2011   Procedure: LUMBAR LAMINECTOMY/DECOMPRESSION MICRODISCECTOMY 2 LEVELS;  Surgeon: Elaina Hoops, MD;  Location: [facility];  Service: Neurosurgery;  Laterality: Left;  Left Lumbar Three-Four, Lumbar Four-Five Decompressive Laminectomy   SALIVARY GLAND SURGERY  ~ 1980   1 removed   SHOULDER SURGERY  2008   right shoulder and collar bone   SIGMOID RESECTION / RECTOPEXY  09/1999   Dr Rosana Hoes   TRIGGER FINGER RELEASE  2004   left thumb   TUMOR REMOVAL  1960's   left  leg   UPPER GASTROINTESTINAL ENDOSCOPY      FAMILY HISTORY: Family History  Problem Relation Age of Onset   Stroke Mother    Hypertension Mother    Stroke Father    Hypertension Father    Diabetes Father    Pancreatic cancer Sister    Diabetes Sister        x 2   Colon cancer Sister 55   Pancreatic cancer Sister    Heart attack Neg Hx    Stomach cancer Neg Hx     SOCIAL HISTORY: Social History   Socioeconomic History   Marital status: Widowed    Spouse name: Not on file   Number of children: 1   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   Highest education level: Not on file  Occupational History    Employer: RETIRED  Tobacco Use   Smoking status: Never   Smokeless tobacco: Never  Vaping Use   Vaping Use: Never used  Substance and Sexual Activity   Alcohol use: No   Drug use: No   Sexual activity: Not Currently    Partners: Male    Birth control/protection: Post-menopausal  Other Topics Concern   Not on file  Social History Narrative   Lives at home alone since spouse died in 07/12/2019, has neighbor for assistance   Right-handed   Drinks no caffeine   Social Determinants of Radio broadcast assistant Strain: Not on file  Food Insecurity: Not on file  Transportation Needs: Not on file  Physical Activity: Not on file  Stress: Not on file  Social Connections:  Not on file  Intimate Partner Violence: Not on file   PHYSICAL EXAM  There were no vitals filed for this visit.  There is no height or weight on file to calculate BMI.  Generalized: Well developed, in no acute distress  Neurological examination  Mentation: Alert oriented to time, place, history taking. Follows all commands speech and language fluent Cranial nerve II-XII: Pupils were equal round reactive to light. Extraocular movements were full, visual field were full on confrontational test. Facial sensation and strength were normal. Head turning and shoulder shrug  were normal and symmetric. Motor: The motor testing reveals 5 over 5 strength of all 4 extremities. Good symmetric motor tone is noted throughout.  Sensory: Sensory testing is intact to soft touch on all 4 extremities. No evidence of extinction is noted.  Coordination: Cerebellar testing reveals good finger-nose-finger and heel-to-shin bilaterally.  Gait and station: Gait is forward leaning, but independent and steady Reflexes: Deep tendon reflexes are symmetric  DIAGNOSTIC DATA (LABS, IMAGING, TESTING) - I reviewed patient records, labs, notes, testing and imaging myself where available.  Lab Results  Component Value Date   WBC 7.3 08/24/2020   HGB 13.5 08/24/2020   HCT 41.6 08/24/2020   MCV 86.5 08/24/2020   PLT 245 08/24/2020      Component Value Date/Time   NA 139 08/24/2020 2006   NA 138 06/06/2019 1300   K 3.7 08/24/2020 2006   CL 104 08/24/2020 2006   CO2 28 08/24/2020 2006   GLUCOSE 89 08/24/2020 2006   BUN 17 08/24/2020 2006   BUN 20 06/06/2019 1300   CREATININE 1.00 08/24/2020 2006   CREATININE 0.93 01/21/2016 1237   CALCIUM 9.3 08/24/2020 2006   PROT 6.5 08/24/2020 2006   PROT 6.7 08/03/2019 0951   ALBUMIN 4.1 08/24/2020 2006   ALBUMIN 4.4 08/03/2019 0951   AST 15 08/24/2020 2006   ALT  7 08/24/2020 2006   ALKPHOS 46 08/24/2020 2006   BILITOT 0.9 08/24/2020 2006   BILITOT 1.1 08/03/2019 0951    GFRNONAA 57 (L) 08/24/2020 2006   GFRAA 61 06/06/2019 1300   Lab Results  Component Value Date   CHOL 174 08/03/2019   HDL 61 08/03/2019   LDLCALC 94 08/03/2019   TRIG 106 08/03/2019   CHOLHDL 2.9 08/03/2019   No results found for: HGBA1C Lab Results  Component Value Date   VITAMINB12 188 (L) 02/06/2011   Lab Results  Component Value Date   TSH 2.569 02/13/2016    ASSESSMENT AND PLAN 81 y.o. year old female   1. Migraine headache   -Currently doing well, only 3 migraines since last seen -Continue Topamax 50 mg at bedtime for migraine prevention, wants to continue with current dosing -Continue Ubrelvy PRN for acute headache, may combine with Phenergan for nausea with acute headache, refills were sent in -Follow-up in 1 year or sooner if needed  Evangeline Dakin, DNP 08/12/2021, 5:55 AM [facility] [address], [place], [place] [address] [phone]

## 2021-08-13 ENCOUNTER — Encounter: Payer: Self-pay | Admitting: Neurology

## 2021-08-13 ENCOUNTER — Ambulatory Visit: Payer: Medicare Other | Admitting: Neurology

## 2021-08-13 DIAGNOSIS — G43109 Migraine with aura, not intractable, without status migrainosus: Secondary | ICD-10-CM | POA: Insufficient documentation

## 2021-08-13 MED ORDER — TOPIRAMATE 25 MG PO TABS: ORAL_TABLET | ORAL | 3 refills | Status: DC

## 2021-08-13 MED ORDER — PROMETHAZINE HCL 25 MG PO TABS: 25.0000 mg | ORAL_TABLET | Freq: Four times a day (QID) | ORAL | 1 refills | Status: DC | PRN

## 2021-08-13 MED ORDER — UBRELVY 50 MG PO TABS: 50.0000 mg | ORAL_TABLET | Freq: Two times a day (BID) | ORAL | 3 refills | Status: DC | PRN

## 2021-08-13 NOTE — Patient Instructions (Addendum)
Great to see you today! I am glad you are doing well! Continue current medications, call if you need anything!  Meds ordered this encounter  Medications   Ubrogepant (UBRELVY) 50 MG TABS    Sig: Take 50 mg by mouth 2 (two) times daily as needed.    Dispense:  10 tablet    Refill:  3   topiramate (TOPAMAX) 25 MG tablet    Sig: Take 2 tablets at night    Dispense:  180 tablet    Refill:  3   promethazine (PHENERGAN) 25 MG tablet    Sig: Take 1 tablet (25 mg total) by mouth every 6 (six) hours as needed for nausea or vomiting.    Dispense:  30 tablet    Refill:  1

## 2021-08-14 ENCOUNTER — Telehealth: Payer: Self-pay | Admitting: Neurology

## 2021-08-14 NOTE — Telephone Encounter (Signed)
Events noted. Will make the change. Thanks.

## 2021-08-14 NOTE — Telephone Encounter (Signed)
Pt called wanting to correct some information that she gave yesterday to the provider during her appt. Pt states that she informed the provider that the last time she had a significant headache was 2 yrs ago. But she states that she was incorrect and it actually is 1 yr Not the 2 yrs that she stated. Pt would like this documented correctly.

## 2021-08-14 NOTE — Telephone Encounter (Signed)
I called the patient back. Left message that we received the updated information and would provide it to [place].

## 2021-08-16 DIAGNOSIS — R051 Acute cough: Secondary | ICD-10-CM | POA: Diagnosis not present

## 2021-08-22 DIAGNOSIS — J209 Acute bronchitis, unspecified: Secondary | ICD-10-CM | POA: Diagnosis not present

## 2021-09-03 DIAGNOSIS — E538 Deficiency of other specified B group vitamins: Secondary | ICD-10-CM | POA: Diagnosis not present

## 2021-09-21 ENCOUNTER — Other Ambulatory Visit: Payer: Self-pay | Admitting: Cardiovascular Disease

## 2021-10-01 DIAGNOSIS — E538 Deficiency of other specified B group vitamins: Secondary | ICD-10-CM | POA: Diagnosis not present

## 2021-10-10 ENCOUNTER — Telehealth: Payer: Self-pay | Admitting: Neurology

## 2021-10-10 DIAGNOSIS — Z01419 Encounter for gynecological examination (general) (routine) without abnormal findings: Secondary | ICD-10-CM | POA: Diagnosis not present

## 2021-10-10 DIAGNOSIS — Z1231 Encounter for screening mammogram for malignant neoplasm of breast: Secondary | ICD-10-CM | POA: Diagnosis not present

## 2021-10-10 DIAGNOSIS — Z6835 Body mass index (BMI) 35.0-35.9, adult: Secondary | ICD-10-CM | POA: Diagnosis not present

## 2021-10-10 MED ORDER — TOPIRAMATE 25 MG PO TABS: ORAL_TABLET | ORAL | 3 refills | Status: DC

## 2021-10-10 NOTE — Telephone Encounter (Signed)
Rx refilled as per last office visit note. I spoke with the patient and informed her of the refill. She verbalized appreciation for the call.

## 2021-10-10 NOTE — Telephone Encounter (Signed)
Wife states she is out of her topiramate (TOPAMAX) 25 MG tablet and is asking it be called into [facility] [phone] as quickly as possible.

## 2021-10-29 DIAGNOSIS — E538 Deficiency of other specified B group vitamins: Secondary | ICD-10-CM | POA: Diagnosis not present

## 2021-11-28 DIAGNOSIS — E538 Deficiency of other specified B group vitamins: Secondary | ICD-10-CM | POA: Diagnosis not present

## 2021-12-31 DIAGNOSIS — Z23 Encounter for immunization: Secondary | ICD-10-CM | POA: Diagnosis not present

## 2021-12-31 DIAGNOSIS — E538 Deficiency of other specified B group vitamins: Secondary | ICD-10-CM | POA: Diagnosis not present

## 2022-01-14 DIAGNOSIS — L989 Disorder of the skin and subcutaneous tissue, unspecified: Secondary | ICD-10-CM | POA: Diagnosis not present

## 2022-01-14 DIAGNOSIS — D492 Neoplasm of unspecified behavior of bone, soft tissue, and skin: Secondary | ICD-10-CM | POA: Diagnosis not present

## 2022-02-05 DIAGNOSIS — Z0000 Encounter for general adult medical examination without abnormal findings: Secondary | ICD-10-CM | POA: Diagnosis not present

## 2022-02-05 DIAGNOSIS — Z1389 Encounter for screening for other disorder: Secondary | ICD-10-CM | POA: Diagnosis not present

## 2022-02-05 DIAGNOSIS — Z23 Encounter for immunization: Secondary | ICD-10-CM | POA: Diagnosis not present

## 2022-02-05 DIAGNOSIS — Z6834 Body mass index (BMI) 34.0-34.9, adult: Secondary | ICD-10-CM | POA: Diagnosis not present

## 2022-02-05 DIAGNOSIS — E538 Deficiency of other specified B group vitamins: Secondary | ICD-10-CM | POA: Diagnosis not present

## 2022-02-13 ENCOUNTER — Encounter: Payer: Self-pay | Admitting: Physician Assistant

## 2022-02-13 ENCOUNTER — Telehealth: Payer: Self-pay

## 2022-02-13 ENCOUNTER — Ambulatory Visit: Payer: Medicare Other | Attending: Physician Assistant | Admitting: Physician Assistant

## 2022-02-13 VITALS — BP 126/70 | HR 68 | Ht 59.0 in | Wt 162.8 lb

## 2022-02-13 DIAGNOSIS — R29818 Other symptoms and signs involving the nervous system: Secondary | ICD-10-CM

## 2022-02-13 DIAGNOSIS — I10 Essential (primary) hypertension: Secondary | ICD-10-CM | POA: Diagnosis not present

## 2022-02-13 DIAGNOSIS — E785 Hyperlipidemia, unspecified: Secondary | ICD-10-CM

## 2022-02-13 LAB — LIPID PANEL
Chol/HDL Ratio: 3.1 ratio (ref 0.0–4.4)
Cholesterol, Total: 236 mg/dL — ABNORMAL HIGH (ref 100–199)
HDL: 77 mg/dL (ref >39)
LDL Chol Calc (NIH): 144 mg/dL — ABNORMAL HIGH (ref 0–99)
Triglycerides: 87 mg/dL (ref 0–149)
VLDL Cholesterol Cal: 15 mg/dL (ref 5–40)

## 2022-02-13 LAB — HEPATIC FUNCTION PANEL
ALT: 10 IU/L (ref 0–32)
AST: 17 IU/L (ref 0–40)
Albumin: 4.7 g/dL (ref 3.8–4.8)
Alkaline Phosphatase: 66 IU/L (ref 44–121)
Bilirubin Total: 1 mg/dL (ref 0.0–1.2)
Bilirubin, Direct: 0.23 mg/dL (ref 0.00–0.40)
Total Protein: 7 g/dL (ref 6.0–8.5)

## 2022-02-13 NOTE — Telephone Encounter (Signed)
Patient given Itamar Sleep study device. App downloaded on patient's phone. Reviewed instructions. Waiver signed. Patient aware that we will call once we receive prior auth to let her know if/when she can proceed with study. Device registered to [employer].

## 2022-02-13 NOTE — Progress Notes (Signed)
Office Visit    Patient Name: [name] Date of Encounter: 02/13/2022  PCP:  Kathyrn Lass, [facility]  Cardiologist:  Sherren Mocha, MD  Advanced Practice Provider:  No care team member to display Electrophysiologist:  None   HPI    [name] is a 81 y.o. female with a past medical history of hypertension, GERD, autonomic insufficiency, migraine headaches, history of prior DVT (after long car laminectomy 6/13), orthostatic hypotension, palpitations, and hyperlipidemia presents today for follow-up appointment.  She was last seen by Dr. Burt Knack 06/2020 and at that time her husband of 79 years passed the previous year and it had been tough for her.  She had lost a lot of weight over the past year.  She was feeling better physically and had improvement in back pain, headaches, and energy level.  Denied chest pain or shortness of breath.  No recent issues with lower extremity edema which has been a previous problem.  She was seen by our pharmacy team a few times for her hypertension.  Last time was May 2023.  She was on metoprolol tartrate 12.5 mg twice a day, lisinopril 10 mg daily, HCTZ 25 mg daily.  Today, she states she woke up last night feeling like she was not getting enough oxygen. She noticed her heart was racing as well. After a few seconds, it resolved. BP was elevated 192/99. She did not take her lisinopril since before bed her BP was low 110/50 and she was told not to take when her diastolic is below 60. She has had some elevated Bps in her log, but these were taken before medication. She states that since her husband died she is up from midnight to 2am and usually sleeps in. Therefore, she does not take her medications until 10am, sometimes noon.   Reports no chest pain, pressure, or tightness. No edema, orthopnea, PND.  Past Medical History    Past Medical History:  Diagnosis Date   Allergy    Arthritis    "got alot; especially in  my spine"   Chest pain 02/2016   Chronic back pain    "all of it"   Classical migraine with intractable migraine 02/25/2016   Diverticulosis of colon (without mention of hemorrhage)    DVT of leg (deep venous thrombosis) ([place]) 09/2011   LLE   Dysrhythmia    notes in epic   Esophageal reflux    Family history of malignant neoplasm of gastrointestinal tract    Fibromyalgia    "dx'd w/it when it first came out"   Hiatal hernia    Hyperlipidemia    Hypertension    Migraines 09/24/11   "often recently;  more controlled now"   Obesity, unspecified    Osteoporosis    Palpitations    event monitor 8/13: NSR, Sinus brady, Sinus Tachy, PAC // Event monitor 10/21: NSR, avg HR 63, no AFib/Flutter; no significant arrhythmia   Personal history of colonic polyps 05/31/2007   adenomatous polyp   Pneumonia    "3 times at least" (09/24/11)   Rheumatoid arthritis(714.0)    Scoliosis    Stricture and stenosis of esophagus    Stroke ([place]) 08/2016   Unspecified gastritis and gastroduodenitis without mention of hemorrhage    Past Surgical History:  Procedure Laterality Date   BACK SURGERY     BREAST CYST INCISION AND DRAINAGE  2012   left; "golf-ball sized"   BREAST SURGERY  cyst lanced and drainded   COLON SURGERY     COLONOSCOPY  2009   COLONOSCOPY WITH PROPOFOL N/A 03/16/2017   Procedure: COLONOSCOPY WITH PROPOFOL;  Surgeon: Gatha Mayer, MD;  [facility];  Service: Endoscopy;  Laterality: N/A;   ESOPHAGEAL DILATION     "several times"   ESOPHAGOGASTRODUODENOSCOPY (EGD) WITH PROPOFOL N/A 03/16/2017   Procedure: ESOPHAGOGASTRODUODENOSCOPY (EGD) WITH PROPOFOL;  Surgeon: Gatha Mayer, MD;  [facility];  Service: Endoscopy;  Laterality: N/A;   EXPLORATORY LAPAROTOMY  04/2000   "for ruptured colon"   HEMATOMA EVACUATION  09/2011   LLE   LUMBAR LAMINECTOMY/DECOMPRESSION MICRODISCECTOMY  09/03/2011   Procedure: LUMBAR LAMINECTOMY/DECOMPRESSION MICRODISCECTOMY 2 LEVELS;   Surgeon: Elaina Hoops, MD;  Location: [place] [facility];  Service: Neurosurgery;  Laterality: Left;  Left Lumbar Three-Four, Lumbar Four-Five Decompressive Laminectomy   SALIVARY GLAND SURGERY  ~ 1980   1 removed   SHOULDER SURGERY  2008   right shoulder and collar bone   SIGMOID RESECTION / RECTOPEXY  09/1999   Dr Emiliano Dyer FINGER RELEASE  2004   left thumb   TUMOR REMOVAL  1960's   left leg   UPPER GASTROINTESTINAL ENDOSCOPY      Allergies  Allergies  Allergen Reactions   Reglan [Metoclopramide] Other (See Comments)    Speech is garbled, extreme trouble making words   Pravastatin     Muscle cramps     EKGs/Labs/Other Studies Reviewed:   The following studies were reviewed today:  Lower ext [place] 09/20/20 Summary:  RIGHT:  - No evidence of common femoral vein obstruction.    LEFT:  - Findings consistent with chronic superficial vein thrombosis involving  the left great saphenous vein.  - Findings appear improved from previous examination.  - There is no evidence of deep vein thrombosis in the lower extremity.   EKG:  EKG is not ordered today.  Recent Labs: No results found for requested labs within last 365 days.  Recent Lipid Panel    Component Value Date/Time   CHOL 174 08/03/2019 0951   TRIG 106 08/03/2019 0951   HDL 61 08/03/2019 0951   CHOLHDL 2.9 08/03/2019 0951   CHOLHDL 4.5 06/06/2007 0040   VLDL 20 06/06/2007 0040   LDLCALC 94 08/03/2019 0951   Home Medications   Current Meds  Medication Sig   acetaminophen (TYLENOL) 500 MG tablet Take 500 mg by mouth daily as needed for moderate pain.   albuterol (PROVENTIL HFA;VENTOLIN HFA) 108 (90 Base) MCG/ACT inhaler Inhale 2 puffs into the lungs every 6 (six) hours as needed for wheezing or shortness of breath.   calcium carbonate (TUMS EX) 750 MG chewable tablet Chew 2 tablets by mouth daily as needed for heartburn.    cholecalciferol (VITAMIN D3) 25 MCG (1000 UNIT) tablet Take 1,000 Units by mouth daily.    cyanocobalamin (,VITAMIN B-12,) 1000 MCG/ML injection Inject 1,000 mcg into the muscle every 30 (thirty) days.   Dextran 70-Hypromellose ([employer] NATURAL TEARS OP) Apply 1-2 drops to eye daily as needed (dry eyes).   fluticasone (FLONASE) 50 MCG/ACT nasal spray Place 1 spray into both nostrils daily as needed for allergies.   hydrochlorothiazide (HYDRODIURIL) 25 MG tablet TAKE 1 TABLET(25 MG) BY MOUTH DAILY   lisinopril (ZESTRIL) 10 MG tablet TAKE 1 TABLET(10 MG) BY MOUTH DAILY   loratadine (CLARITIN) 10 MG tablet Take 10 mg by mouth daily as needed for allergies.   metoprolol tartrate (LOPRESSOR) 25 MG tablet TAKE 1  TABLET(25 MG) BY MOUTH TWICE DAILY (Patient taking differently: 25 mg. Per Patient taking 1/2 tablet (12.5 mg) twice a day)   neomycin-bacitracin-polymyxin (NEOSPORIN) ointment Apply 1 application topically as needed for wound care.   pantoprazole (PROTONIX) 40 MG tablet TAKE 1 TABLET(40 MG) BY MOUTH TWICE DAILY BEFORE A MEAL   polyethylene glycol (MIRALAX / GLYCOLAX) packet Take 17 g by mouth daily as needed for mild constipation.   promethazine (PHENERGAN) 25 MG tablet Take 1 tablet (25 mg total) by mouth every 6 (six) hours as needed for nausea or vomiting.   rosuvastatin (CRESTOR) 5 MG tablet TAKE 1 TABLET BY MOUTH 3 TIMES A WEEK (Patient taking differently: TAKE 1 TABLET BY MOUTH 3 TIMES A WEEK. Per patient doesn't take everyday)   topiramate (TOPAMAX) 25 MG tablet Take 2 tablets at night   Ubrogepant (UBRELVY) 50 MG TABS Take 50 mg by mouth 2 (two) times daily as needed.     Review of Systems      All other systems reviewed and are otherwise negative except as noted above.  Physical Exam    VS:  BP 126/70   Pulse 68   Ht '4\' 11"'$  (1.499 m)   Wt 162 lb 12.8 oz (73.8 kg)   LMP  (LMP Unknown)   SpO2 96%   BMI 32.88 kg/m  , BMI Body mass index is 32.88 kg/m.  Wt Readings from Last 3 Encounters:  02/13/22 162 lb 12.8 oz (73.8 kg)  08/13/21 165 lb (74.8 kg)  08/24/20 160 lb  (72.6 kg)     GEN: Well nourished, well developed, in no acute distress. HEENT: normal. Neck: Supple, no JVD, carotid bruits, or masses. Cardiac: RRR, no murmurs, rubs, or gallops. No clubbing, cyanosis, edema.  Radials/PT 2+ and equal bilaterally.  Respiratory:  Respirations regular and unlabored, clear to auscultation bilaterally. GI: Soft, nontender, nondistended. MS: No deformity or atrophy. Skin: Warm and dry, no rash. Neuro:  Strength and sensation are intact. Psych: Normal affect.  Assessment & Plan    OSA? -at home sleep study ordered today and follow-up with Dr. Radford Pax -palpitations at night when she feels like she isn't getting enough oxygen, BP elevated  Hypertension -pharmD in 3 weeks for a BP check -track BP an hour after morning medications and an hour after evening medications -continue current medications for now  Hyperlipidemia -lipid panel and LFTs today -continue Crestor     Disposition: Follow up 1 year with Sherren Mocha, MD or APP.  Signed, Elgie Collard, PA-C 02/13/2022, 11:34 AM [facility]

## 2022-02-13 NOTE — Patient Instructions (Signed)
Medication Instructions:  Your physician recommends that you continue on your current medications as directed. Please refer to the Current Medication list given to you today.  *If you need a refill on your cardiac medications before your next appointment, please call your pharmacy*   Lab Work: Schedule a fasting lipid panel and lft's  If you have labs (blood work) drawn today and your tests are completely normal, you will receive your results only by: [facility] (if you have MyChart) OR A paper copy in the mail If you have any lab test that is abnormal or we need to change your treatment, we will call you to review the results.   Testing/Procedures: Your provider has recommended that you have a Itamar sleep study.   Follow-Up: At [facility], you and your health needs are our priority.  As part of our continuing mission to provide you with exceptional heart care, we have created designated Provider Care Teams.  These Care Teams include your primary Cardiologist (physician) and Advanced Practice Providers (APPs -  Physician Assistants and Nurse Practitioners) who all work together to provide you with the care you need, when you need it.  We recommend signing up for the patient portal called "MyChart".  Sign up information is provided on this After Visit Summary.  MyChart is used to connect with patients for Virtual Visits (Telemedicine).  Patients are able to view lab/test results, encounter notes, upcoming appointments, etc.  Non-urgent messages can be sent to your provider as well.   To learn more about what you can do with MyChart, go to [email or web address].    Your next appointment:   1 year(s)  The format for your next appointment:   In Person  Provider:   Sherren Mocha, MD    Other Instructions 1.You have been referred to see the pharmacist her in our office in 3-4 weeks for a blood pressure check.  2.You have been referred to see Dr Radford Pax here in our  office for sleep. 3.Check your blood pressure one hour after taking your morning medications and one hour after taking your evening medications for the next 2 weeks and call us with the readings.   Important Information About Sugar

## 2022-02-13 NOTE — Addendum Note (Signed)
Addended by: Juventino Slovak on: 02/13/2022 11:52 AM   Modules accepted: Orders

## 2022-02-17 NOTE — Telephone Encounter (Signed)
Prior Authorization for [facility] sent to [facility] via web portal. Tracking Number . READY TO SCHED-do not require Pre-Authorization

## 2022-02-17 NOTE — Telephone Encounter (Signed)
Left message she is approved for the Itamar sleep study. Left detailed message with the PIN# [id number] and if she may do the study this week. Once the results are in we will call.

## 2022-02-26 ENCOUNTER — Telehealth: Payer: Self-pay | Admitting: Cardiovascular Disease

## 2022-02-26 NOTE — Telephone Encounter (Signed)
Patient is calling about a home sleep study she was suppose to do last week, but she never did it.  She wants to know until when she has to be able to do it.  She wants to know if she can do it next week.

## 2022-02-27 ENCOUNTER — Telehealth: Payer: Self-pay | Admitting: Cardiology

## 2022-02-27 NOTE — Telephone Encounter (Signed)
Patient received her itamar sleep study but wants to know how long she has before she can start it. She states that she would like to start it Tuesday 12/26. Please advise if this is possible.

## 2022-02-28 NOTE — Telephone Encounter (Signed)
Left message for the pt that she can do her sleep study 03/04/22 that will be fine. I left the PIN# again [id number]. I will make note pt will do the study 03/04/22. We will call once the results are in.

## 2022-03-04 NOTE — Telephone Encounter (Signed)
Per Julaine Hua, Left message she is approved for the Itamar sleep study. Left detailed message with the PIN# [id number] and if she may do the study this week. Once the results are in we will call.

## 2022-03-07 DIAGNOSIS — E538 Deficiency of other specified B group vitamins: Secondary | ICD-10-CM | POA: Diagnosis not present

## 2022-03-11 ENCOUNTER — Other Ambulatory Visit: Payer: Self-pay | Admitting: Cardiovascular Disease

## 2022-03-21 ENCOUNTER — Ambulatory Visit: Payer: Medicare Other

## 2022-04-04 DIAGNOSIS — E538 Deficiency of other specified B group vitamins: Secondary | ICD-10-CM | POA: Diagnosis not present

## 2022-04-08 DIAGNOSIS — E669 Obesity, unspecified: Secondary | ICD-10-CM | POA: Diagnosis not present

## 2022-04-08 DIAGNOSIS — R7303 Prediabetes: Secondary | ICD-10-CM | POA: Diagnosis not present

## 2022-04-08 DIAGNOSIS — N1831 Chronic kidney disease, stage 3a: Secondary | ICD-10-CM | POA: Diagnosis not present

## 2022-04-08 DIAGNOSIS — I10 Essential (primary) hypertension: Secondary | ICD-10-CM | POA: Diagnosis not present

## 2022-04-08 DIAGNOSIS — J302 Other seasonal allergic rhinitis: Secondary | ICD-10-CM | POA: Diagnosis not present

## 2022-04-08 DIAGNOSIS — Z23 Encounter for immunization: Secondary | ICD-10-CM | POA: Diagnosis not present

## 2022-04-08 DIAGNOSIS — E538 Deficiency of other specified B group vitamins: Secondary | ICD-10-CM | POA: Diagnosis not present

## 2022-04-15 ENCOUNTER — Telehealth: Payer: Self-pay | Admitting: Cardiovascular Disease

## 2022-04-15 NOTE — Telephone Encounter (Signed)
Left message to call the clinic.

## 2022-04-15 NOTE — Telephone Encounter (Signed)
Patient states she was returning a call. Please advise  

## 2022-04-17 NOTE — Telephone Encounter (Signed)
Left message to call the clinic.

## 2022-04-18 NOTE — Telephone Encounter (Signed)
I also called this patient a while back and left her several messages regarding lab results/med change per Nicholes Rough PA-C. Arbie Cookey, could you please put her in contact with me when she returns your call so that I can discuss with her?

## 2022-04-18 NOTE — Telephone Encounter (Signed)
Message was sent to me that the pt was calling back. I reviewed the chart and saw that I had left her a message 02/28/22 in regard to her sleep study, with the PIN# [id number] left on her vm and if she could please do her sleep study by 03/04/22.   I have reviewed the site for the Itamar and pt has not done her sleep study yet. I left message today that I need her to do the sleep study this weekend, and to call me today 04/18/22 to let me know that she got this message, I leave message that the authorization is only good for a certain length of time as well.

## 2022-05-07 DIAGNOSIS — E538 Deficiency of other specified B group vitamins: Secondary | ICD-10-CM | POA: Diagnosis not present

## 2022-05-07 DIAGNOSIS — T148XXA Other injury of unspecified body region, initial encounter: Secondary | ICD-10-CM | POA: Diagnosis not present

## 2022-05-19 DIAGNOSIS — S30861A Insect bite (nonvenomous) of abdominal wall, initial encounter: Secondary | ICD-10-CM | POA: Diagnosis not present

## 2022-05-19 DIAGNOSIS — S20169A Insect bite (nonvenomous) of breast, unspecified breast, initial encounter: Secondary | ICD-10-CM | POA: Diagnosis not present

## 2022-05-23 DIAGNOSIS — H524 Presbyopia: Secondary | ICD-10-CM | POA: Diagnosis not present

## 2022-06-05 DIAGNOSIS — E538 Deficiency of other specified B group vitamins: Secondary | ICD-10-CM | POA: Diagnosis not present

## 2022-06-12 ENCOUNTER — Other Ambulatory Visit: Payer: Self-pay | Admitting: Cardiovascular Disease

## 2022-06-16 ENCOUNTER — Other Ambulatory Visit: Payer: Self-pay

## 2022-06-16 MED ORDER — UBRELVY 50 MG PO TABS: 50.0000 mg | ORAL_TABLET | Freq: Two times a day (BID) | ORAL | 3 refills | Status: DC | PRN

## 2022-06-23 ENCOUNTER — Other Ambulatory Visit (HOSPITAL_COMMUNITY): Payer: Self-pay

## 2022-06-23 ENCOUNTER — Telehealth: Payer: Self-pay | Admitting: Neurology

## 2022-06-23 NOTE — Telephone Encounter (Signed)
[name] not Riehl. I misread. Sending to her for pa completion

## 2022-06-23 NOTE — Telephone Encounter (Signed)
Pt stated she needs a PA for Ubrogepant (UBRELVY) 50 MG TABS. Stated pharmacy has sent several faxes requesting PA.  Pt stated she is almost of of medication.

## 2022-06-23 NOTE — Telephone Encounter (Signed)
Pharmacy Patient Advocate Encounter   Received notification from [employer] that prior authorization for Ubrelvy 50MG  tablets is required/requested.   PA submitted on 06/23/2022 to (ins) [facility] [place] [employer] via CoverMyMeds Key or (Medicaid) confirmation # [id number] Status is pending

## 2022-06-23 NOTE — Telephone Encounter (Signed)
Routing to [employer] as she does the pas

## 2022-06-24 DIAGNOSIS — M25551 Pain in right hip: Secondary | ICD-10-CM | POA: Diagnosis not present

## 2022-06-24 DIAGNOSIS — M5440 Lumbago with sciatica, unspecified side: Secondary | ICD-10-CM | POA: Diagnosis not present

## 2022-06-24 DIAGNOSIS — M5126 Other intervertebral disc displacement, lumbar region: Secondary | ICD-10-CM | POA: Diagnosis not present

## 2022-06-27 ENCOUNTER — Other Ambulatory Visit (HOSPITAL_COMMUNITY): Payer: Self-pay

## 2022-06-27 NOTE — Telephone Encounter (Signed)
Pharmacy Patient Advocate Encounter  Prior Authorization for Ubrelvy  tablets has been approved by [facility] [place] [employer] (ins).    PA # PA Case ID #: [id number] Effective dates: 06/26/2022 through 06/26/2023

## 2022-06-30 DIAGNOSIS — S70362A Insect bite (nonvenomous), left thigh, initial encounter: Secondary | ICD-10-CM | POA: Diagnosis not present

## 2022-07-07 DIAGNOSIS — E538 Deficiency of other specified B group vitamins: Secondary | ICD-10-CM | POA: Diagnosis not present

## 2022-07-08 MED ORDER — UBRELVY 50 MG PO TABS: 50.0000 mg | ORAL_TABLET | Freq: Two times a day (BID) | ORAL | 0 refills | Status: DC | PRN

## 2022-07-08 NOTE — Telephone Encounter (Signed)
Refill sent. Please notify pt 90 days sent in

## 2022-07-08 NOTE — Addendum Note (Signed)
Addended by: Danne Harbor on: 07/08/2022 02:47 PM   Modules accepted: Orders

## 2022-07-08 NOTE — Telephone Encounter (Signed)
Pt asking if Ubrelvy 50MG  tablets can be prescribed for 30 day or a 90 day supply. Stated 10 pills are $40.00 dollars and she can't afford that for 10 pills.

## 2022-07-09 NOTE — Telephone Encounter (Signed)
Called pt. LVM of message Hailey sent.

## 2022-07-10 DIAGNOSIS — L298 Other pruritus: Secondary | ICD-10-CM | POA: Diagnosis not present

## 2022-07-10 DIAGNOSIS — S90821A Blister (nonthermal), right foot, initial encounter: Secondary | ICD-10-CM | POA: Diagnosis not present

## 2022-07-18 ENCOUNTER — Other Ambulatory Visit: Payer: Self-pay | Admitting: Cardiovascular Disease

## 2022-07-24 DIAGNOSIS — M47816 Spondylosis without myelopathy or radiculopathy, lumbar region: Secondary | ICD-10-CM | POA: Diagnosis not present

## 2022-07-24 DIAGNOSIS — M25551 Pain in right hip: Secondary | ICD-10-CM | POA: Diagnosis not present

## 2022-07-24 DIAGNOSIS — M5126 Other intervertebral disc displacement, lumbar region: Secondary | ICD-10-CM | POA: Diagnosis not present

## 2022-07-26 DIAGNOSIS — S40862A Insect bite (nonvenomous) of left upper arm, initial encounter: Secondary | ICD-10-CM | POA: Diagnosis not present

## 2022-07-31 DIAGNOSIS — W57XXXA Bitten or stung by nonvenomous insect and other nonvenomous arthropods, initial encounter: Secondary | ICD-10-CM | POA: Diagnosis not present

## 2022-07-31 DIAGNOSIS — S90561A Insect bite (nonvenomous), right ankle, initial encounter: Secondary | ICD-10-CM | POA: Diagnosis not present

## 2022-07-31 DIAGNOSIS — Z792 Long term (current) use of antibiotics: Secondary | ICD-10-CM | POA: Diagnosis not present

## 2022-08-06 DIAGNOSIS — E538 Deficiency of other specified B group vitamins: Secondary | ICD-10-CM | POA: Diagnosis not present

## 2022-08-12 DIAGNOSIS — L821 Other seborrheic keratosis: Secondary | ICD-10-CM | POA: Diagnosis not present

## 2022-08-12 DIAGNOSIS — L810 Postinflammatory hyperpigmentation: Secondary | ICD-10-CM | POA: Diagnosis not present

## 2022-08-12 DIAGNOSIS — L298 Other pruritus: Secondary | ICD-10-CM | POA: Diagnosis not present

## 2022-08-12 DIAGNOSIS — D492 Neoplasm of unspecified behavior of bone, soft tissue, and skin: Secondary | ICD-10-CM | POA: Diagnosis not present

## 2022-08-12 DIAGNOSIS — L989 Disorder of the skin and subcutaneous tissue, unspecified: Secondary | ICD-10-CM | POA: Diagnosis not present

## 2022-08-12 DIAGNOSIS — L239 Allergic contact dermatitis, unspecified cause: Secondary | ICD-10-CM | POA: Diagnosis not present

## 2022-08-14 ENCOUNTER — Encounter: Payer: Self-pay | Admitting: Neurology

## 2022-08-14 ENCOUNTER — Ambulatory Visit: Payer: Medicare Other | Admitting: Neurology

## 2022-08-14 VITALS — BP 185/81 | HR 69 | Ht 59.0 in | Wt 164.5 lb

## 2022-08-14 DIAGNOSIS — G43E09 Chronic migraine with aura, not intractable, without status migrainosus: Secondary | ICD-10-CM | POA: Diagnosis not present

## 2022-08-14 MED ORDER — TOPIRAMATE 25 MG PO TABS: ORAL_TABLET | ORAL | 3 refills | Status: DC

## 2022-08-14 MED ORDER — UBRELVY 50 MG PO TABS: 50.0000 mg | ORAL_TABLET | Freq: Two times a day (BID) | ORAL | 1 refills | Status: DC | PRN

## 2022-08-14 MED ORDER — PROMETHAZINE HCL 25 MG PO TABS
25.0000 mg | ORAL_TABLET | Freq: Four times a day (QID) | ORAL | 1 refills | Status: DC | PRN
Start: 2022-08-14 — End: 2023-08-13

## 2022-08-14 NOTE — Progress Notes (Signed)
PATIENT: [name] DOB: March 27, 1940  REASON FOR VISIT: follow up for migraine HISTORY FROM: patient, alone PRIMARY NEUROLOGIST: Dr. Donald Pore   HISTORY OF PRESENT ILLNESS: Today 08/14/22 Doing great, no significant headache in the last year. Headaches start as visual aura, will take the Ubrelvy at onset, lay down, resolves very well. Has phenergan to take if needed for nausea, hers is expired now. Takes Topamax 50 mg at bedtime. Tolerating well, has really benefited from. Living alone, driving a car. She drives everyone else around.  Health has been good. Has had several ticks on her this year, course of antibiotics. Maybe takes 1 Ubrelvy a month.   08/13/21 SS: [name] is here today for follow-up. Doing well. Her grandson just got married, she is happy about this. Her headaches are doing wonderful. 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 No significant headache in 1 year. Gets visual aura at onset, will take Ubrelvy at onset of this, then lays down. Resolves. Takes Topamax 50 mg at bedtime. Takes phenergan PRN for nausea, doesn't take every time, only take 1/2 tablet. Has 2 styes to right eye, got dust in her eye after weed eating. Has migraine less than 1 day a month.  Remains very independent.  Update 08/01/2020 SS: [name] is a 82 year old female with history of migraine headache. Is on Topamax, takes Bernita Raisin as needed for acute headache. Is not a candidate for triptans use due to HTN, some degree of SVD by MRI of the brain. Has taken Ubrelvy 3 times, works well and quickly to abort migraine. Health has been good, watching her sugars, lost 6 lbs since last seen, over 2 years lost 40 lbs. Lives alone, drives a car. Has supportive family and church. Her husband passed away in 19-Jun-2019. All she takes Phenergan for acute headache due to nausea.  Here today for evaluation unaccompanied.  HISTORY 02/01/2020 Dr. Anne Hahn: [name] is a 82 year old right-handed white female with a history of  migraine headache.  The patient usually has very few headaches, but recently she has been under stress after she lost her husband.  She was placed on Topamax, she is taking 50 mg at night.  She went to the emergency room on 13 December 2019 with a bad headache.  The patient really has not had any significant problems with headaches since that time.  She has Imitrex to take for her headaches if needed, but she does have a history of hypertension and she has some degree of small vessel disease by MRI of the brain.  The patient is reporting some intermittent tingling of the left hand, this may occur at nighttime, or while driving.  She returns here for further evaluation.   REVIEW OF SYSTEMS: Out of a complete 14 system review of symptoms, the patient complains only of the following symptoms, and all other reviewed systems are negative.  See HPI  ALLERGIES: Allergies  Allergen Reactions   Metoclopramide Other (See Comments)    Speech is garbled, extreme trouble making words   Codeine    Pravastatin     Muscle cramps    HOME MEDICATIONS: Outpatient Medications Prior to Visit  Medication Sig Dispense Refill   acetaminophen (TYLENOL) 500 MG tablet Take 500 mg by mouth daily as needed for moderate pain.     albuterol (PROVENTIL HFA;VENTOLIN HFA) 108 (90 Base) MCG/ACT inhaler Inhale 2 puffs into the lungs every 6 (six) hours as needed for wheezing or shortness of breath.     calcium carbonate (  TUMS EX) 750 MG chewable tablet Chew 2 tablets by mouth daily as needed for heartburn.      cholecalciferol (VITAMIN D3) 25 MCG (1000 UNIT) tablet Take 1,000 Units by mouth daily.     cyanocobalamin (,VITAMIN B-12,) 1000 MCG/ML injection Inject 1,000 mcg into the muscle every 30 (thirty) days.     Dextran 70-Hypromellose ([employer] NATURAL TEARS OP) Apply 1-2 drops to eye daily as needed (dry eyes).     fluticasone (FLONASE) 50 MCG/ACT nasal spray Place 1 spray into both nostrils daily as needed for allergies.  1    hydrochlorothiazide (HYDRODIURIL) 25 MG tablet TAKE 1 TABLET(25 MG) BY MOUTH DAILY 90 tablet 3   lisinopril (ZESTRIL) 10 MG tablet TAKE 1 TABLET(10 MG) BY MOUTH DAILY 90 tablet 2   loratadine (CLARITIN) 10 MG tablet Take 10 mg by mouth daily as needed for allergies.     metoprolol tartrate (LOPRESSOR) 25 MG tablet TAKE 1 TABLET(25 MG) BY MOUTH TWICE DAILY 60 tablet 8   neomycin-bacitracin-polymyxin (NEOSPORIN) ointment Apply 1 application topically as needed for wound care.     pantoprazole (PROTONIX) 40 MG tablet TAKE 1 TABLET(40 MG) BY MOUTH TWICE DAILY BEFORE A MEAL 180 tablet 0   polyethylene glycol (MIRALAX / GLYCOLAX) packet Take 17 g by mouth daily as needed for mild constipation.     promethazine (PHENERGAN) 25 MG tablet Take 1 tablet (25 mg total) by mouth every 6 (six) hours as needed for nausea or vomiting. 30 tablet 1   rosuvastatin (CRESTOR) 5 MG tablet TAKE 1 TABLET BY MOUTH 3 TIMES A WEEK (Patient taking differently: TAKE 1 TABLET BY MOUTH 3 TIMES A WEEK. Per patient doesn't take everyday) 36 tablet 2   topiramate (TOPAMAX) 25 MG tablet Take 2 tablets at night 180 tablet 3   Ubrogepant (UBRELVY) 50 MG TABS Take 1 tablet (50 mg total) by mouth 2 (two) times daily as needed. 90 tablet 0   No facility-administered medications prior to visit.    PAST MEDICAL HISTORY: Past Medical History:  Diagnosis Date   Allergy    Arthritis    "got alot; especially in my spine"   Chest pain 02/2016   Chronic back pain    "all of it"   Classical migraine with intractable migraine 02/25/2016   Diverticulosis of colon (without mention of hemorrhage)    DVT of leg (deep venous thrombosis) (HCC) 09/2011   LLE   Dysrhythmia    notes in epic   Esophageal reflux    Family history of malignant neoplasm of gastrointestinal tract    Fibromyalgia    "dx'd w/it when it first came out"   Hiatal hernia    Hyperlipidemia    Hypertension    Migraines 09/24/11   "often recently;  more controlled now"    Obesity, unspecified    Osteoporosis    Palpitations    event monitor 8/13: NSR, Sinus brady, Sinus Tachy, PAC // Event monitor 10/21: NSR, avg HR 63, no AFib/Flutter; no significant arrhythmia   Personal history of colonic polyps 05/31/2007   adenomatous polyp   Pneumonia    "3 times at least" (09/24/11)   Rheumatoid arthritis(714.0)    Scoliosis    Stricture and stenosis of esophagus    Stroke (HCC) 08/2016   Unspecified gastritis and gastroduodenitis without mention of hemorrhage     PAST SURGICAL HISTORY: Past Surgical History:  Procedure Laterality Date   BACK SURGERY     BREAST CYST INCISION AND DRAINAGE  2012  left; "golf-ball sized"   BREAST SURGERY     cyst lanced and drainded   COLON SURGERY     COLONOSCOPY  2009   COLONOSCOPY WITH PROPOFOL N/A 03/16/2017   Procedure: COLONOSCOPY WITH PROPOFOL;  Surgeon: Iva Boop, MD;  Location: [facility];  Service: Endoscopy;  Laterality: N/A;   ESOPHAGEAL DILATION     "several times"   ESOPHAGOGASTRODUODENOSCOPY (EGD) WITH PROPOFOL N/A 03/16/2017   Procedure: ESOPHAGOGASTRODUODENOSCOPY (EGD) WITH PROPOFOL;  Surgeon: Iva Boop, MD;  Location: [facility];  Service: Endoscopy;  Laterality: N/A;   EXPLORATORY LAPAROTOMY  04/2000   "for ruptured colon"   HEMATOMA EVACUATION  09/2011   LLE   LUMBAR LAMINECTOMY/DECOMPRESSION MICRODISCECTOMY  09/03/2011   Procedure: LUMBAR LAMINECTOMY/DECOMPRESSION MICRODISCECTOMY 2 LEVELS;  Surgeon: Mariam Dollar, MD;  Location: [facility];  Service: Neurosurgery;  Laterality: Left;  Left Lumbar Three-Four, Lumbar Four-Five Decompressive Laminectomy   SALIVARY GLAND SURGERY  ~ 1980   1 removed   SHOULDER SURGERY  2008   right shoulder and collar bone   SIGMOID RESECTION / RECTOPEXY  09/1999   Dr Earlene Plater   TRIGGER FINGER RELEASE  2004   left thumb   TUMOR REMOVAL  1960's   left leg   UPPER GASTROINTESTINAL ENDOSCOPY      FAMILY HISTORY: Family History  Problem Relation Age of Onset    Stroke Mother    Hypertension Mother    Stroke Father    Hypertension Father    Diabetes Father    Pancreatic cancer Sister    Diabetes Sister        x 2   Colon cancer Sister 36   Pancreatic cancer Sister    Heart attack Neg Hx    Stomach cancer Neg Hx     SOCIAL HISTORY: Social History   Socioeconomic History   Marital status: Widowed    Spouse name: Not on file   Number of children: 1   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   Highest education level: Not on file  Occupational History    Employer: RETIRED  Tobacco Use   Smoking status: Never   Smokeless tobacco: Never  Vaping Use   Vaping Use: Never used  Substance and Sexual Activity   Alcohol use: No   Drug use: No   Sexual activity: Not Currently    Partners: Male    Birth control/protection: Post-menopausal  Other Topics Concern   Not on file  Social History Narrative   Lives at home alone since spouse died in 06-18-2019, has neighbor for assistance   Right-handed   Drinks no caffeine   Social Determinants of Corporate investment banker Strain: Not on file  Food Insecurity: Not on file  Transportation Needs: Not on file  Physical Activity: Not on file  Stress: Not on file  Social Connections: Not on file  Intimate Partner Violence: Not on file   PHYSICAL EXAM  Vitals:   08/14/22 1443  BP: (!) 185/81  Pulse: 69  Weight: 164 lb 8 oz (74.6 kg)  Height: 4\' 11"  (1.499 m)   Body mass index is 33.22 kg/m.  Generalized: Well developed, in no acute distress  Neurological examination  Mentation: Alert oriented to time, place, history taking. Follows all commands speech and language fluent Cranial nerve II-XII: Pupils were equal round reactive to light. Extraocular movements were full, visual field were full on confrontational test. Facial sensation and strength were normal. Head turning and shoulder shrug  were normal  and symmetric. Motor: The motor testing reveals 5 over 5 strength of all 4  extremities. Good symmetric motor tone is noted throughout.  Sensory: Sensory testing is intact to soft touch on all 4 extremities. No evidence of extinction is noted.  Coordination: Cerebellar testing reveals good finger-nose-finger and heel-to-shin bilaterally.  Gait and station: Gait is forward leaning, but independent and steady Reflexes: Deep tendon reflexes are symmetric  DIAGNOSTIC DATA (LABS, IMAGING, TESTING) - I reviewed patient records, labs, notes, testing and imaging myself where available.  Lab Results  Component Value Date   WBC 7.3 08/24/2020   HGB 13.5 08/24/2020   HCT 41.6 08/24/2020   MCV 86.5 08/24/2020   PLT 245 08/24/2020      Component Value Date/Time   NA 139 08/24/2020 2006   NA 138 06/06/2019 1300   K 3.7 08/24/2020 2006   CL 104 08/24/2020 2006   CO2 28 08/24/2020 2006   GLUCOSE 89 08/24/2020 2006   BUN 17 08/24/2020 2006   BUN 20 06/06/2019 1300   CREATININE 1.00 08/24/2020 2006   CREATININE 0.93 01/21/2016 1237   CALCIUM 9.3 08/24/2020 2006   PROT 7.0 02/13/2022 1156   ALBUMIN 4.7 02/13/2022 1156   AST 17 02/13/2022 1156   ALT 10 02/13/2022 1156   ALKPHOS 66 02/13/2022 1156   BILITOT 1.0 02/13/2022 1156   GFRNONAA 57 (L) 08/24/2020 2006   GFRAA 61 06/06/2019 1300   Lab Results  Component Value Date   CHOL 236 (H) 02/13/2022   HDL 77 02/13/2022   LDLCALC 144 (H) 02/13/2022   TRIG 87 02/13/2022   CHOLHDL 3.1 02/13/2022   No results found for: "HGBA1C" Lab Results  Component Value Date   VITAMINB12 188 (L) 02/06/2011   Lab Results  Component Value Date   TSH 2.569 02/13/2016    ASSESSMENT AND PLAN 82 y.o. year old female   1. Migraine headache   -Continues to do very well, headaches under very good control -Continue Topamax as preventative, Ubrelvy for acute headache, may combine with Phenergan as needed for nausea with headache -I refilled her medications as 72-month supply, indicates in the past a letter has had to be sent to  insurance to fill them that way to help lower cost, will do so again if needed -Follow-up 1 year or sooner if needed   [name], Edrick Oh, DNP 08/14/2022, 2:54 PM [facility] [address] [place], [place] [address] [phone]

## 2022-08-14 NOTE — Patient Instructions (Signed)
Continue current medications  Call for worsening headache

## 2022-08-19 DIAGNOSIS — M47817 Spondylosis without myelopathy or radiculopathy, lumbosacral region: Secondary | ICD-10-CM | POA: Diagnosis not present

## 2022-08-19 DIAGNOSIS — M25551 Pain in right hip: Secondary | ICD-10-CM | POA: Diagnosis not present

## 2022-08-22 DIAGNOSIS — L080 Pyoderma: Secondary | ICD-10-CM | POA: Diagnosis not present

## 2022-08-22 DIAGNOSIS — I872 Venous insufficiency (chronic) (peripheral): Secondary | ICD-10-CM | POA: Diagnosis not present

## 2022-09-09 DIAGNOSIS — E538 Deficiency of other specified B group vitamins: Secondary | ICD-10-CM | POA: Diagnosis not present

## 2022-10-02 DIAGNOSIS — M5416 Radiculopathy, lumbar region: Secondary | ICD-10-CM | POA: Diagnosis not present

## 2022-10-07 DIAGNOSIS — E538 Deficiency of other specified B group vitamins: Secondary | ICD-10-CM | POA: Diagnosis not present

## 2022-10-13 ENCOUNTER — Other Ambulatory Visit: Payer: Self-pay

## 2022-10-13 MED ORDER — TOPIRAMATE 25 MG PO TABS: ORAL_TABLET | ORAL | 3 refills | Status: DC

## 2022-10-14 ENCOUNTER — Other Ambulatory Visit: Payer: Self-pay | Admitting: Cardiovascular Disease

## 2022-10-16 DIAGNOSIS — M25551 Pain in right hip: Secondary | ICD-10-CM | POA: Diagnosis not present

## 2022-11-03 DIAGNOSIS — Z6832 Body mass index (BMI) 32.0-32.9, adult: Secondary | ICD-10-CM | POA: Diagnosis not present

## 2022-11-03 DIAGNOSIS — M5416 Radiculopathy, lumbar region: Secondary | ICD-10-CM | POA: Diagnosis not present

## 2022-11-07 DIAGNOSIS — E538 Deficiency of other specified B group vitamins: Secondary | ICD-10-CM | POA: Diagnosis not present

## 2022-11-25 DIAGNOSIS — M25551 Pain in right hip: Secondary | ICD-10-CM | POA: Diagnosis not present

## 2022-12-08 DIAGNOSIS — E538 Deficiency of other specified B group vitamins: Secondary | ICD-10-CM | POA: Diagnosis not present

## 2022-12-08 DIAGNOSIS — Z23 Encounter for immunization: Secondary | ICD-10-CM | POA: Diagnosis not present

## 2022-12-31 DIAGNOSIS — K080 Exfoliation of teeth due to systemic causes: Secondary | ICD-10-CM | POA: Diagnosis not present

## 2023-01-08 DIAGNOSIS — Z1231 Encounter for screening mammogram for malignant neoplasm of breast: Secondary | ICD-10-CM | POA: Diagnosis not present

## 2023-01-08 DIAGNOSIS — E538 Deficiency of other specified B group vitamins: Secondary | ICD-10-CM | POA: Diagnosis not present

## 2023-01-08 DIAGNOSIS — Z01419 Encounter for gynecological examination (general) (routine) without abnormal findings: Secondary | ICD-10-CM | POA: Diagnosis not present

## 2023-01-08 DIAGNOSIS — Z6835 Body mass index (BMI) 35.0-35.9, adult: Secondary | ICD-10-CM | POA: Diagnosis not present

## 2023-01-08 DIAGNOSIS — M8588 Other specified disorders of bone density and structure, other site: Secondary | ICD-10-CM | POA: Diagnosis not present

## 2023-01-20 DIAGNOSIS — H25812 Combined forms of age-related cataract, left eye: Secondary | ICD-10-CM | POA: Diagnosis not present

## 2023-01-20 DIAGNOSIS — H2512 Age-related nuclear cataract, left eye: Secondary | ICD-10-CM | POA: Diagnosis not present

## 2023-02-04 DIAGNOSIS — E538 Deficiency of other specified B group vitamins: Secondary | ICD-10-CM | POA: Diagnosis not present

## 2023-02-09 DIAGNOSIS — L290 Pruritus ani: Secondary | ICD-10-CM | POA: Diagnosis not present

## 2023-02-09 DIAGNOSIS — L918 Other hypertrophic disorders of the skin: Secondary | ICD-10-CM | POA: Diagnosis not present

## 2023-02-09 DIAGNOSIS — R32 Unspecified urinary incontinence: Secondary | ICD-10-CM | POA: Diagnosis not present

## 2023-03-03 DIAGNOSIS — H00015 Hordeolum externum left lower eyelid: Secondary | ICD-10-CM | POA: Diagnosis not present

## 2023-03-06 DIAGNOSIS — E538 Deficiency of other specified B group vitamins: Secondary | ICD-10-CM | POA: Diagnosis not present

## 2023-04-07 ENCOUNTER — Encounter: Payer: Self-pay | Admitting: Cardiovascular Disease

## 2023-04-07 ENCOUNTER — Ambulatory Visit: Payer: Medicare Other | Attending: Cardiovascular Disease | Admitting: Cardiovascular Disease

## 2023-04-07 VITALS — BP 134/70 | HR 65 | Ht <= 58 in | Wt 160.4 lb

## 2023-04-07 DIAGNOSIS — E538 Deficiency of other specified B group vitamins: Secondary | ICD-10-CM | POA: Diagnosis not present

## 2023-04-07 DIAGNOSIS — I10 Essential (primary) hypertension: Secondary | ICD-10-CM | POA: Diagnosis not present

## 2023-04-07 DIAGNOSIS — E785 Hyperlipidemia, unspecified: Secondary | ICD-10-CM | POA: Diagnosis not present

## 2023-04-07 DIAGNOSIS — R002 Palpitations: Secondary | ICD-10-CM

## 2023-04-07 DIAGNOSIS — R29818 Other symptoms and signs involving the nervous system: Secondary | ICD-10-CM

## 2023-04-07 MED ORDER — METOPROLOL TARTRATE 25 MG PO TABS: 25.0000 mg | ORAL_TABLET | Freq: Two times a day (BID) | ORAL | 3 refills | Status: AC

## 2023-04-07 MED ORDER — ROSUVASTATIN CALCIUM 5 MG PO TABS: ORAL_TABLET | ORAL | 3 refills | Status: DC

## 2023-04-07 NOTE — Assessment & Plan Note (Signed)
Blood pressure control is suboptimal.  She keeps a good recording of her home blood pressure readings.  I recommended that she continue lisinopril and hydrochlorothiazide at current doses and increase metoprolol to 25 mg twice daily.

## 2023-04-07 NOTE — Patient Instructions (Signed)
Medication Instructions:  Increase Metoprolol Tartrate to 25 mg twice daily *If you need a refill on your cardiac medications before your next appointment, please call your pharmacy*  Testing/Procedures: ECHO Your physician has requested that you have an echocardiogram. Echocardiography is a painless test that uses sound waves to create images of your heart. It provides your doctor with information about the size and shape of your heart and how well your heart's chambers and valves are working. This procedure takes approximately one hour. There are no restrictions for this procedure. Please do NOT wear cologne, perfume, aftershave, or lotions (deodorant is allowed). Please arrive 15 minutes prior to your appointment time.  Please note: We ask at that you not bring children with you during ultrasound (echo/ vascular) testing. Due to room size and safety concerns, children are not allowed in the ultrasound rooms during exams. Our front office staff cannot provide observation of children in our lobby area while testing is being conducted. An adult accompanying a patient to their appointment will only be allowed in the ultrasound room at the discretion of the ultrasound technician under special circumstances. We apologize for any inconvenience.    Follow-Up: At [facility], you and your health needs are our priority.  As part of our continuing mission to provide you with exceptional heart care, we have created designated Provider Care Teams.  These Care Teams include your primary Cardiologist (physician) and Advanced Practice Providers (APPs -  Physician Assistants and Nurse Practitioners) who all work together to provide you with the care you need, when you need it.  We recommend signing up for the patient portal called "MyChart".  Sign up information is provided on this After Visit Summary.  MyChart is used to connect with patients for Virtual Visits (Telemedicine).  Patients are able to  view lab/test results, encounter notes, upcoming appointments, etc.  Non-urgent messages can be sent to your provider as well.   To learn more about what you can do with MyChart, go to [email or web address].    Your next appointment:   1 year(s)  Provider:   Tonny Bollman, MD     Other Instructions   1st Floor: - Lobby - Registration  - Pharmacy  - Lab - Cafe  2nd Floor: - PV Lab - Diagnostic Testing (echo, CT, nuclear med)  3rd Floor: - Vacant  4th Floor: - TCTS (cardiothoracic surgery) - AFib Clinic - Structural Heart Clinic - Vascular Surgery  - Vascular Ultrasound  5th Floor: - [facility] (general and EP) - Clinical Pharmacy for coumadin, hypertension, lipid, weight-loss medications, and med management appointments    Valet parking services will be available as well.

## 2023-04-07 NOTE — Assessment & Plan Note (Signed)
I recommended a 2D echocardiogram to evaluate for any LV systolic or diastolic dysfunction as well as any valvular disease that could be related to her heart palpitations.  We discussed a monitor, but she prefers to wait at this time.  Her symptoms may be improved with an increased dose of metoprolol.  See above, dose increased to 25 mg twice daily.

## 2023-04-07 NOTE — Progress Notes (Signed)
Cardiology Office Note:    Date:  04/07/2023   ID:  [name], DOB [birth year], MRN [id number]  PCP:  Sigmund Hazel, MD   [facility] Providers Cardiologist:  Tonny Bollman, MD     Referring MD: Sigmund Hazel, MD   Chief Complaint  Patient presents with   Hypertension    History of Present Illness:    [name] is a 83 y.o. female with a hx of hypertension, autonomic dysfunction, orthostatic hypotension, mixed hyperlipidemia, presenting for follow-up evaluation.  The patient is here alone today.  She has been checking her blood pressure on a regular basis.  She is seeing some elevated readings especially her systolic readings have been greater than 140 mmHg about half the time.  She is getting a notification on her blood pressure monitor that she is having irregular heartbeats.  She is also experiencing some heart palpitations at rest.  She denies any change in her caffeine intake or stress level.  She has no chest pain or pressure, no shortness of breath, no leg swelling, and no lightheadedness or syncope.  Current Medications: Current Meds  Medication Sig   acetaminophen (TYLENOL) 500 MG tablet Take 500 mg by mouth daily as needed for moderate pain.   albuterol (PROVENTIL HFA;VENTOLIN HFA) 108 (90 Base) MCG/ACT inhaler Inhale 2 puffs into the lungs every 6 (six) hours as needed for wheezing or shortness of breath.   Blood Glucose Monitoring Suppl (CONTOUR NEXT EZ) w/Device KIT as directed to test blood sugars once daily (E11.3299)   calcium carbonate (TUMS EX) 750 MG chewable tablet Chew 2 tablets by mouth daily as needed for heartburn.    cholecalciferol (VITAMIN D3) 25 MCG (1000 UNIT) tablet Take 1,000 Units by mouth daily.   cyanocobalamin (,VITAMIN B-12,) 1000 MCG/ML injection Inject 1,000 mcg into the muscle every 30 (thirty) days.   Dextran 70-Hypromellose ([employer] NATURAL TEARS OP) Apply 1-2 drops to eye daily as needed (dry eyes).   fluticasone (FLONASE) 50  MCG/ACT nasal spray Place 1 spray into both nostrils daily as needed for allergies.   hydrochlorothiazide (HYDRODIURIL) 25 MG tablet TAKE 1 TABLET(25 MG) BY MOUTH DAILY   lisinopril (ZESTRIL) 10 MG tablet TAKE 1 TABLET(10 MG) BY MOUTH DAILY   loratadine (CLARITIN) 10 MG tablet Take 10 mg by mouth daily as needed for allergies.   Microlet Lancets MISC as directed to test blood sugars once daily(E11.3299)   neomycin-bacitracin-polymyxin (NEOSPORIN) ointment Apply 1 application topically as needed for wound care.   pantoprazole (PROTONIX) 40 MG tablet TAKE 1 TABLET(40 MG) BY MOUTH TWICE DAILY BEFORE A MEAL   polyethylene glycol (MIRALAX / GLYCOLAX) packet Take 17 g by mouth daily as needed for mild constipation.   promethazine (PHENERGAN) 25 MG tablet Take 1 tablet (25 mg total) by mouth every 6 (six) hours as needed for nausea or vomiting.   topiramate (TOPAMAX) 25 MG tablet Take 2 tablets at night   Ubrogepant (UBRELVY) 50 MG TABS Take 1 tablet (50 mg total) by mouth 2 (two) times daily as needed.   [DISCONTINUED] metoprolol tartrate (LOPRESSOR) 25 MG tablet TAKE 1 TABLET(25 MG) BY MOUTH TWICE DAILY   [DISCONTINUED] rosuvastatin (CRESTOR) 5 MG tablet TAKE 1 TABLET BY MOUTH 3 TIMES A WEEK     Allergies:   Metoclopramide, Codeine, and Pravastatin   ROS:   Please see the history of present illness.    All other systems reviewed and are negative.  EKGs/Labs/Other Studies Reviewed:    The following  studies were reviewed today: Cardiac Studies & Procedures      ECHOCARDIOGRAM  ECHOCARDIOGRAM COMPLETE 02/13/2016  Narrative *[place]* *[facility]* [address] [place], [place] [address] [phone]  ------------------------------------------------------------------- Transthoracic Echocardiography  Patient:    Megan, Walker MR #:       [id number] Study Date: 02/13/2016 Gender:     F Age:        73 Height:     152.4 cm Weight:     92.1 kg BSA:        2.03  m^2 Pt. Status: Room:       [id number]  ADMITTING    Doristine Devoid REFERRING    [place] ATTENDING    [place], [place] [phone] PERFORMING   [facility], Inpatient SONOGRAPHER  [facility]  cc:  ------------------------------------------------------------------- LV EF: 50% -   55%  ------------------------------------------------------------------- History:   PMH:   Chest pain.  Risk factors:  Hypertension. Dyslipidemia.  ------------------------------------------------------------------- Study Conclusions  - Left ventricle: The cavity size was normal. Wall thickness was increased in a pattern of mild LVH. Systolic function was normal. The estimated ejection fraction was in the range of 50% to 55%. Wall motion was normal; there were no regional wall motion abnormalities.  ------------------------------------------------------------------- Study data:  The previous study was not available, so comparison was made to the report of 09/25/2011.  Study status:  Routine. Procedure:  The patient reported no pain pre or post test. Transthoracic echocardiography. Image quality was adequate.  Study completion:  There were no complications.          Transthoracic echocardiography.  M-mode, complete 2D, spectral Doppler, and color Doppler.  Birthdate:  Patient birthdate: 1941-01-24.  Age:  Patient is 83 yr old.  Sex:  Gender: female.    BMI: 39.7 kg/m^2.  Blood pressure:     93/45  Patient status:  Inpatient.  Study date: Study date: 02/13/2016. Study time: 04:28 PM.  Location:  Bedside.  -------------------------------------------------------------------  ------------------------------------------------------------------- Left ventricle:  the images of the LV are technically difficult and the LV function is difficult to assess. The cavity size was normal. Wall thickness was increased in a pattern of mild LVH. Systolic function was normal.  The estimated ejection fraction was in the range of 50% to 55%. Wall motion was normal; there were no regional wall motion abnormalities.  ------------------------------------------------------------------- Aortic valve:   Structurally normal valve.   Cusp separation was normal.  Doppler:  Transvalvular velocity was within the normal range. There was no stenosis. There was no regurgitation.  ------------------------------------------------------------------- Aorta:  Aortic root: The aortic root was normal in size. Ascending aorta: The ascending aorta was normal in size.  ------------------------------------------------------------------- Mitral valve:   Structurally normal valve.   Leaflet separation was normal.  Doppler:  Transvalvular velocity was within the normal range. There was no evidence for stenosis. There was trivial regurgitation.    Peak gradient (D): 4 mm Hg.  ------------------------------------------------------------------- Left atrium:  The atrium was normal in size.  ------------------------------------------------------------------- Right ventricle:  The cavity size was normal. Systolic function was normal.  ------------------------------------------------------------------- Pulmonic valve:    The valve appears to be grossly normal. Doppler:  There was trivial regurgitation.  ------------------------------------------------------------------- Tricuspid valve:   The valve appears to be grossly normal. Doppler:  There was trivial regurgitation.  ------------------------------------------------------------------- Right atrium:  The atrium was normal in size.  ------------------------------------------------------------------- Pericardium:  There was no pericardial effusion.  ------------------------------------------------------------------- Measurements  Left  ventricle                         Value        Reference LV ID, ED, PLAX chordal        (L)      40.1  mm     43 - 52 LV ID, ES, PLAX chordal                24.6  mm     23 - 38 LV fx shortening, PLAX chordal         39    %      >=29 LV PW thickness, ED                    10.6  mm     ---------- IVS/LV PW ratio, ED                    1.16         <=1.3 LV e&', lateral                         7.62  cm/s   ---------- LV E/e&', lateral                       12.73        ---------- LV e&', medial                          6.09  cm/s   ---------- LV E/e&', medial                        15.93        ---------- LV e&', average                         6.86  cm/s   ---------- LV E/e&', average                       14.15        ----------  Ventricular septum                     Value        Reference IVS thickness, ED                      12.3  mm     ----------  LVOT                                   Value        Reference LVOT ID, S                             16    mm     ---------- LVOT area                              2.01  cm^2   ----------  Aorta  Value        Reference Aortic root ID, ED                     26    mm     ----------  Left atrium                            Value        Reference LA ID, A-P, ES                         40    mm     ---------- LA ID/bsa, A-P                         1.97  cm/m^2 <=2.2 LA volume, S                           34.9  ml     ---------- LA volume/bsa, S                       17.2  ml/m^2 ---------- LA volume, ES, 1-p A4C                 45.5  ml     ---------- LA volume/bsa, ES, 1-p A4C             22.5  ml/m^2 ---------- LA volume, ES, 1-p A2C                 27.2  ml     ---------- LA volume/bsa, ES, 1-p A2C             13.4  ml/m^2 ----------  Mitral valve                           Value        Reference Mitral E-wave peak velocity            97    cm/s   ---------- Mitral A-wave peak velocity            88.3  cm/s   ---------- Mitral deceleration time       (H)     239   ms     150 - 230 Mitral peak  gradient, D                4     mm Hg  ---------- Mitral E/A ratio, peak                 1.1          ----------  Right atrium                           Value        Reference RA ID, S-I, ES, A4C            (L)     4.73  mm     34 - 49 RA area, ES, A4C                       12.7  cm^2   8.3 - 19.5 RA volume, ES, A/L  26    ml     ---------- RA volume/bsa, ES, A/L                 12.8  ml/m^2 ----------  Systemic veins                         Value        Reference Estimated CVP                          3     mm Hg  ----------  Right ventricle                        Value        Reference TAPSE                                  15.5  mm     ---------- RV s&', lateral, S                      13.1  cm/s   ----------  Legend: (L)  and  (H)  mark values outside specified reference range.  ------------------------------------------------------------------- Prepared and Electronically Authenticated by  Kristeen Miss, M.D. 2017-12-06T18:39:59   MONITORS  CARDIAC EVENT MONITOR 01/05/2020  Narrative 1. The basic rhythm is normal sinus with an average HR of 63 (53-106) bpm 2. No atrial fibrillation or flutter 3. No high-grade heart block or pathologic pauses 4. There are no PVC's and rare supraventricular beats without sustained arrhythmias  Benign event monitor with no significant arrhythmia identified. Some periods of artifact.           EKG:   EKG Interpretation Date/Time:  Tuesday April 07 2023 14:53:35 EST Ventricular Rate:  65 PR Interval:  214 QRS Duration:  74 QT Interval:  378 QTC Calculation: 393 R Axis:   -46  Text Interpretation: Sinus rhythm with 1st degree A-V block Left axis deviation Inferior infarct , age undetermined Anterolateral infarct , age undetermined When compared with ECG of 13-Dec-2019 18:41, PREVIOUS ECG IS PRESENTage-indeterminate anterior infarct is now present Confirmed by Tonny Bollman [phone]) on 04/07/2023 3:08:37 PM     Recent Labs: No results found for requested labs within last 365 days.  Recent Lipid Panel    Component Value Date/Time   CHOL 236 (H) 02/13/2022 1156   TRIG 87 02/13/2022 1156   HDL 77 02/13/2022 1156   CHOLHDL 3.1 02/13/2022 1156   CHOLHDL 4.5 06/06/2007 0040   VLDL 20 06/06/2007 0040   LDLCALC 144 (H) 02/13/2022 1156     Risk Assessment/Calculations:           STOP-Bang Score:          Physical Exam:    VS:  BP 134/70   Pulse 65   Ht 4' (1.219 m)   Wt 160 lb 6.4 oz (72.8 kg)   LMP  (LMP Unknown)   SpO2 94%   BMI 48.95 kg/m     Wt Readings from Last 3 Encounters:  04/07/23 160 lb 6.4 oz (72.8 kg)  08/14/22 164 lb 8 oz (74.6 kg)  02/13/22 162 lb 12.8 oz (73.8 kg)     GEN:  Well nourished, well developed in no acute distress HEENT: Normal NECK: No JVD; No carotid bruits LYMPHATICS: No lymphadenopathy CARDIAC: RRR,  no murmurs, rubs, gallops RESPIRATORY:  Clear to auscultation without rales, wheezing or rhonchi  ABDOMEN: Soft, non-tender, non-distended MUSCULOSKELETAL:  No edema; No deformity  SKIN: Warm and dry NEUROLOGIC:  Alert and oriented x 3 PSYCHIATRIC:  Normal affect   Assessment & Plan Essential hypertension Blood pressure control is suboptimal.  She keeps a good recording of her home blood pressure readings.  I recommended that she continue lisinopril and hydrochlorothiazide at current doses and increase metoprolol to 25 mg twice daily. Hyperlipidemia LDL goal <70 Treated with low-dose rosuvastatin as tolerated.  Dose limited by side effect profile.  Lipids followed by PCP. Suspected sleep apnea Discussed with patient.  She is not interested in pursuing further evaluation at this time. Palpitations I recommended a 2D echocardiogram to evaluate for any LV systolic or diastolic dysfunction as well as any valvular disease that could be related to her heart palpitations.  We discussed a monitor, but she prefers to wait at this time.  Her symptoms may  be improved with an increased dose of metoprolol.  See above, dose increased to 25 mg twice daily.      Medication Adjustments/Labs and Tests Ordered: Current medicines are reviewed at length with the patient today.  Concerns regarding medicines are outlined above.  Orders Placed This Encounter  Procedures   EKG 12-Lead   ECHOCARDIOGRAM COMPLETE   Meds ordered this encounter  Medications   rosuvastatin (CRESTOR) 5 MG tablet    Sig: TAKE 1 TABLET BY MOUTH 3 TIMES A WEEK    Dispense:  36 tablet    Refill:  3   metoprolol tartrate (LOPRESSOR) 25 MG tablet    Sig: Take 1 tablet (25 mg total) by mouth 2 (two) times daily.    Dispense:  180 tablet    Refill:  3    Patient Instructions  Medication Instructions:  Increase Metoprolol Tartrate to 25 mg twice daily *If you need a refill on your cardiac medications before your next appointment, please call your pharmacy*  Testing/Procedures: ECHO Your physician has requested that you have an echocardiogram. Echocardiography is a painless test that uses sound waves to create images of your heart. It provides your doctor with information about the size and shape of your heart and how well your heart's chambers and valves are working. This procedure takes approximately one hour. There are no restrictions for this procedure. Please do NOT wear cologne, perfume, aftershave, or lotions (deodorant is allowed). Please arrive 15 minutes prior to your appointment time.  Please note: We ask at that you not bring children with you during ultrasound (echo/ vascular) testing. Due to room size and safety concerns, children are not allowed in the ultrasound rooms during exams. Our front office staff cannot provide observation of children in our lobby area while testing is being conducted. An adult accompanying a patient to their appointment will only be allowed in the ultrasound room at the discretion of the ultrasound technician under special circumstances.  We apologize for any inconvenience.    Follow-Up: At [facility], you and your health needs are our priority.  As part of our continuing mission to provide you with exceptional heart care, we have created designated Provider Care Teams.  These Care Teams include your primary Cardiologist (physician) and Advanced Practice Providers (APPs -  Physician Assistants and Nurse Practitioners) who all work together to provide you with the care you need, when you need it.  We recommend signing up for the patient portal called "MyChart".  Sign up information is provided on this After Visit Summary.  MyChart is used to connect with patients for Virtual Visits (Telemedicine).  Patients are able to view lab/test results, encounter notes, upcoming appointments, etc.  Non-urgent messages can be sent to your provider as well.   To learn more about what you can do with MyChart, go to [email or web address].    Your next appointment:   1 year(s)  Provider:   Tonny Bollman, MD     Other Instructions   1st Floor: - Lobby - Registration  - Pharmacy  - Lab - Cafe  2nd Floor: - PV Lab - Diagnostic Testing (echo, CT, nuclear med)  3rd Floor: - Vacant  4th Floor: - TCTS (cardiothoracic surgery) - AFib Clinic - Structural Heart Clinic - Vascular Surgery  - Vascular Ultrasound  5th Floor: - HeartCare Cardiology (general and EP) - Clinical Pharmacy for coumadin, hypertension, lipid, weight-loss medications, and med management appointments    Valet parking services will be available as well.          Signed, Tonny Bollman, MD  04/07/2023 5:16 PM    [facility]

## 2023-04-13 DIAGNOSIS — E559 Vitamin D deficiency, unspecified: Secondary | ICD-10-CM | POA: Diagnosis not present

## 2023-04-13 DIAGNOSIS — R7303 Prediabetes: Secondary | ICD-10-CM | POA: Diagnosis not present

## 2023-04-13 DIAGNOSIS — N1831 Chronic kidney disease, stage 3a: Secondary | ICD-10-CM | POA: Diagnosis not present

## 2023-04-13 DIAGNOSIS — E7800 Pure hypercholesterolemia, unspecified: Secondary | ICD-10-CM | POA: Diagnosis not present

## 2023-04-13 DIAGNOSIS — E538 Deficiency of other specified B group vitamins: Secondary | ICD-10-CM | POA: Diagnosis not present

## 2023-04-13 DIAGNOSIS — G43909 Migraine, unspecified, not intractable, without status migrainosus: Secondary | ICD-10-CM | POA: Diagnosis not present

## 2023-04-22 ENCOUNTER — Telehealth: Payer: Self-pay | Admitting: Cardiovascular Disease

## 2023-04-22 NOTE — Telephone Encounter (Signed)
Patient would like a call back to discuss upcoming echo appt. States that she has a slight cold, but not running temp. She states she is taking OTC cold medication and would like to know if she still come to the appt scheduled for echo.

## 2023-04-23 NOTE — Telephone Encounter (Signed)
Returned call to patient who states she is still not running a fever, feeling better and only coughs "here and there" (dry). Advised based on this, there's no contraindication for her to do her ECHO tomorrow.

## 2023-04-24 ENCOUNTER — Ambulatory Visit (HOSPITAL_COMMUNITY): Payer: Medicare Other | Attending: Cardiovascular Disease

## 2023-04-24 DIAGNOSIS — R000 Tachycardia, unspecified: Secondary | ICD-10-CM | POA: Diagnosis not present

## 2023-04-24 DIAGNOSIS — I119 Hypertensive heart disease without heart failure: Secondary | ICD-10-CM | POA: Diagnosis not present

## 2023-04-24 DIAGNOSIS — R0609 Other forms of dyspnea: Secondary | ICD-10-CM | POA: Diagnosis not present

## 2023-04-24 DIAGNOSIS — I081 Rheumatic disorders of both mitral and tricuspid valves: Secondary | ICD-10-CM | POA: Diagnosis not present

## 2023-04-24 DIAGNOSIS — E669 Obesity, unspecified: Secondary | ICD-10-CM | POA: Diagnosis not present

## 2023-04-24 DIAGNOSIS — R079 Chest pain, unspecified: Secondary | ICD-10-CM | POA: Diagnosis not present

## 2023-04-24 DIAGNOSIS — I951 Orthostatic hypotension: Secondary | ICD-10-CM | POA: Insufficient documentation

## 2023-04-24 DIAGNOSIS — R002 Palpitations: Secondary | ICD-10-CM | POA: Insufficient documentation

## 2023-04-24 DIAGNOSIS — I340 Nonrheumatic mitral (valve) insufficiency: Secondary | ICD-10-CM | POA: Diagnosis not present

## 2023-04-24 DIAGNOSIS — E785 Hyperlipidemia, unspecified: Secondary | ICD-10-CM | POA: Diagnosis not present

## 2023-04-24 LAB — ECHOCARDIOGRAM COMPLETE
Area-P 1/2: 5.27 cm2
S' Lateral: 2.1 cm

## 2023-05-03 ENCOUNTER — Other Ambulatory Visit: Payer: Self-pay | Admitting: Cardiovascular Disease

## 2023-05-05 ENCOUNTER — Other Ambulatory Visit: Payer: Self-pay | Admitting: Cardiovascular Disease

## 2023-05-07 DIAGNOSIS — E538 Deficiency of other specified B group vitamins: Secondary | ICD-10-CM | POA: Diagnosis not present

## 2023-05-14 DIAGNOSIS — M4120 Other idiopathic scoliosis, site unspecified: Secondary | ICD-10-CM | POA: Diagnosis not present

## 2023-05-14 DIAGNOSIS — M25551 Pain in right hip: Secondary | ICD-10-CM | POA: Diagnosis not present

## 2023-05-14 DIAGNOSIS — M25561 Pain in right knee: Secondary | ICD-10-CM | POA: Diagnosis not present

## 2023-05-28 DIAGNOSIS — M25551 Pain in right hip: Secondary | ICD-10-CM | POA: Diagnosis not present

## 2023-06-02 ENCOUNTER — Other Ambulatory Visit: Payer: Self-pay | Admitting: Cardiovascular Disease

## 2023-06-04 DIAGNOSIS — E538 Deficiency of other specified B group vitamins: Secondary | ICD-10-CM | POA: Diagnosis not present

## 2023-06-08 DIAGNOSIS — L290 Pruritus ani: Secondary | ICD-10-CM | POA: Diagnosis not present

## 2023-06-08 DIAGNOSIS — R32 Unspecified urinary incontinence: Secondary | ICD-10-CM | POA: Diagnosis not present

## 2023-06-08 DIAGNOSIS — L918 Other hypertrophic disorders of the skin: Secondary | ICD-10-CM | POA: Diagnosis not present

## 2023-06-16 ENCOUNTER — Telehealth: Payer: Self-pay

## 2023-06-16 ENCOUNTER — Other Ambulatory Visit (HOSPITAL_COMMUNITY): Payer: Self-pay

## 2023-06-16 NOTE — Telephone Encounter (Signed)
   Pharmacy Patient Advocate Encounter   Received notification from Fax that prior authorization for Ubrelvy 50mg  Tablet is required/requested.   Insurance verification completed.   The patient is insured through [facility] .   Per test claim: The current 30 day co-pay is, $45.00.  No PA needed at this time. This test claim was processed through [facility]- copay amounts may vary at other pharmacies due to pharmacy/plan contracts, or as the patient moves through the different stages of their insurance plan.

## 2023-07-07 DIAGNOSIS — E538 Deficiency of other specified B group vitamins: Secondary | ICD-10-CM | POA: Diagnosis not present

## 2023-08-04 DIAGNOSIS — E538 Deficiency of other specified B group vitamins: Secondary | ICD-10-CM | POA: Diagnosis not present

## 2023-08-13 ENCOUNTER — Ambulatory Visit: Payer: Medicare Other | Admitting: Neurology

## 2023-08-13 ENCOUNTER — Encounter: Payer: Self-pay | Admitting: Neurology

## 2023-08-13 VITALS — BP 161/72 | HR 57 | Resp 16 | Ht 59.0 in

## 2023-08-13 DIAGNOSIS — G43E09 Chronic migraine with aura, not intractable, without status migrainosus: Secondary | ICD-10-CM

## 2023-08-13 MED ORDER — UBRELVY 50 MG PO TABS: 50.0000 mg | ORAL_TABLET | Freq: Two times a day (BID) | ORAL | 1 refills | Status: AC | PRN

## 2023-08-13 MED ORDER — TOPIRAMATE 25 MG PO TABS: ORAL_TABLET | ORAL | 3 refills | Status: AC

## 2023-08-13 MED ORDER — PROMETHAZINE HCL 25 MG PO TABS: 25.0000 mg | ORAL_TABLET | Freq: Four times a day (QID) | ORAL | 1 refills | Status: AC | PRN

## 2023-08-13 NOTE — Patient Instructions (Addendum)
 Great to see you today! Continue current medications. Call for worsening headache. Follow up in 1 year. Thanks!!   Meds ordered this encounter  Medications   topiramate  (TOPAMAX ) 25 MG tablet    Sig: Take 2 tablets at night    Dispense:  180 tablet    Refill:  3   promethazine  (PHENERGAN ) 25 MG tablet    Sig: Take 1 tablet (25 mg total) by mouth every 6 (six) hours as needed for nausea or vomiting.    Dispense:  30 tablet    Refill:  1   Ubrogepant  (UBRELVY ) 50 MG TABS    Sig: Take 1 tablet (50 mg total) by mouth 2 (two) times daily as needed.    Dispense:  30 tablet    Refill:  1

## 2023-08-13 NOTE — Progress Notes (Signed)
 PATIENT: [name] DOB: Oct 23, 1940  REASON FOR VISIT: follow up for migraine HISTORY FROM: patient, alone PRIMARY NEUROLOGIST: Dr. Rhea Cecil. [name]   HISTORY OF PRESENT ILLNESS: Today 08/13/23 Has not needed Ubrelvy  in several months!! Hasn't had even the beginning of any migraine no visual aura. Has been doing great! Remains on Topamax  50 mg at bedtime. Mentions losing hair. Right hip bothering her. Lives alone, drives. Insurance approved going back to [employer].   08/14/22 SS: Doing great, no significant headache in the last year. Headaches start as visual aura, will take the Ubrelvy  at onset, lay down, resolves very well. Has phenergan  to take if needed for nausea, hers is expired now. Takes Topamax  50 mg at bedtime. Tolerating well, has really benefited from. Living alone, driving a car. She drives everyone else around.  Health has been good. Has had several ticks on her this year, course of antibiotics. Maybe takes 1 Ubrelvy  a month.   08/13/21 SS: [name] is here today for follow-up. Doing well. Her grandson just got married, she is happy about this. Her headaches are doing wonderful. Ubrelvy  works great. No significant headache in 1 year. Gets visual aura at onset, will take Ubrelvy  at onset of this, then lays down. Resolves. Takes Topamax  50 mg at bedtime. Takes phenergan  PRN for nausea, doesn't take every time, only take 1/2 tablet. Has 2 styes to right eye, got dust in her eye after weed eating. Has migraine less than 1 day a month.  Remains very independent.  Update 08/01/2020 SS: [name] is a 83 year old female with history of migraine headache. Is on Topamax , takes Ubrelvy  as needed for acute headache. Is not a candidate for triptans use due to HTN, some degree of SVD by MRI of the brain. Has taken Ubrelvy  3 times, works well and quickly to abort migraine. Health has been good, watching her sugars, lost 6 lbs since last seen, over 2 years lost 40 lbs. Lives alone, drives a  car. Has supportive family and church. Her husband passed away in Jul 16, 2019. All she takes Phenergan  for acute headache due to nausea.  Here today for evaluation unaccompanied.  HISTORY 02/01/2020 Dr. Tilda Fogo: [name] is a 83 year old right-handed white female with a history of migraine headache.  The patient usually has very few headaches, but recently she has been under stress after she lost her husband.  She was placed on Topamax , she is taking 50 mg at night.  She went to the emergency room on 13 December 2019 with a bad headache.  The patient really has not had any significant problems with headaches since that time.  She has Imitrex  to take for her headaches if needed, but she does have a history of hypertension and she has some degree of small vessel disease by MRI of the brain.  The patient is reporting some intermittent tingling of the left hand, this may occur at nighttime, or while driving.  She returns here for further evaluation.   REVIEW OF SYSTEMS: Out of a complete 14 system review of symptoms, the patient complains only of the following symptoms, and all other reviewed systems are negative.  See HPI  ALLERGIES: Allergies  Allergen Reactions   Metoclopramide  Other (See Comments)    Speech is garbled, extreme trouble making words   Codeine    Pravastatin      Muscle cramps    HOME MEDICATIONS: Outpatient Medications Prior to Visit  Medication Sig Dispense Refill   acetaminophen  (TYLENOL ) 500 MG  tablet Take 500 mg by mouth daily as needed for moderate pain.     albuterol  (PROVENTIL  HFA;VENTOLIN  HFA) 108 (90 Base) MCG/ACT inhaler Inhale 2 puffs into the lungs every 6 (six) hours as needed for wheezing or shortness of breath.     augmented betamethasone dipropionate (DIPROLENE-AF) 0.05 % cream as needed.     Blood Glucose Monitoring Suppl (CONTOUR NEXT EZ) w/Device KIT as directed to test blood sugars once daily (E11.3299)     calcium  carbonate (SUPER CALCIUM ) 1500 (600 Ca) MG  TABS tablet daily.     calcium  carbonate (TUMS EX) 750 MG chewable tablet Chew 2 tablets by mouth daily as needed for heartburn.      cholecalciferol (VITAMIN D3) 25 MCG (1000 UNIT) tablet Take 1,000 Units by mouth daily.     CONTOUR NEXT TEST test strip daily.     cyanocobalamin  (,VITAMIN B-12,) 1000 MCG/ML injection Inject 1,000 mcg into the muscle every 30 (thirty) days.     Dextran 70-Hypromellose ([employer] NATURAL TEARS OP) Apply 1-2 drops to eye daily as needed (dry eyes).     fluticasone (FLONASE) 50 MCG/ACT nasal spray Place 1 spray into both nostrils daily as needed for allergies.  1   hydrochlorothiazide  (HYDRODIURIL ) 25 MG tablet Take 1 tablet (25 mg total) by mouth daily. 90 tablet 3   lisinopril  (ZESTRIL ) 10 MG tablet TAKE 1 TABLET(10 MG) BY MOUTH DAILY 90 tablet 3   loratadine (CLARITIN) 10 MG tablet Take 10 mg by mouth daily as needed for allergies.     metoprolol  tartrate (LOPRESSOR ) 25 MG tablet Take 1 tablet (25 mg total) by mouth 2 (two) times daily. 180 tablet 3   Microlet Lancets MISC as directed to test blood sugars once daily(E11.3299)     neomycin-bacitracin -polymyxin (NEOSPORIN) ointment Apply 1 application topically as needed for wound care.     pantoprazole  (PROTONIX ) 40 MG tablet TAKE 1 TABLET(40 MG) BY MOUTH TWICE DAILY BEFORE A MEAL 180 tablet 0   polyethylene glycol (MIRALAX  / GLYCOLAX ) packet Take 17 g by mouth daily as needed for mild constipation.     promethazine  (PHENERGAN ) 25 MG tablet Take 1 tablet (25 mg total) by mouth every 6 (six) hours as needed for nausea or vomiting. 30 tablet 1   rosuvastatin  (CRESTOR ) 5 MG tablet TAKE 1 TABLET BY MOUTH 3 TIMES A WEEK 36 tablet 3   topiramate  (TOPAMAX ) 25 MG tablet Take 2 tablets at night 180 tablet 3   Ubrogepant  (UBRELVY ) 50 MG TABS Take 1 tablet (50 mg total) by mouth 2 (two) times daily as needed. 30 tablet 1   No facility-administered medications prior to visit.    PAST MEDICAL HISTORY: Past Medical History:   Diagnosis Date   Allergy    Arthritis    "got alot; especially in my spine"   Chest pain 02/2016   Chronic back pain    "all of it"   Classical migraine with intractable migraine 02/25/2016   Diverticulosis of colon (without mention of hemorrhage)    DVT of leg (deep venous thrombosis) (HCC) 09/2011   LLE   Dysrhythmia    notes in epic   Esophageal reflux    Family history of malignant neoplasm of gastrointestinal tract    Fibromyalgia    "dx'd w/it when it first came out"   Hiatal hernia    Hyperlipidemia    Hypertension    Migraines 09/24/11   "often recently;  more controlled now"   Obesity, unspecified    Osteoporosis  Palpitations    event monitor 8/13: NSR, Sinus brady, Sinus Tachy, PAC // Event monitor 10/21: NSR, avg HR 63, no AFib/Flutter; no significant arrhythmia   Personal history of colonic polyps 05/31/2007   adenomatous polyp   Pneumonia    "3 times at least" (09/24/11)   Rheumatoid arthritis(714.0)    Scoliosis    Stricture and stenosis of esophagus    Stroke (HCC) 08/2016   Unspecified gastritis and gastroduodenitis without mention of hemorrhage     PAST SURGICAL HISTORY: Past Surgical History:  Procedure Laterality Date   BACK SURGERY     BREAST CYST INCISION AND DRAINAGE  2012   left; "golf-ball sized"   BREAST SURGERY     cyst lanced and drainded   COLON SURGERY     COLONOSCOPY  2009   COLONOSCOPY WITH PROPOFOL  N/A 03/16/2017   Procedure: COLONOSCOPY WITH PROPOFOL ;  Surgeon: Kenney Peacemaker, MD;  Location: [facility];  Service: Endoscopy;  Laterality: N/A;   ESOPHAGEAL DILATION     "several times"   ESOPHAGOGASTRODUODENOSCOPY (EGD) WITH PROPOFOL  N/A 03/16/2017   Procedure: ESOPHAGOGASTRODUODENOSCOPY (EGD) WITH PROPOFOL ;  Surgeon: Kenney Peacemaker, MD;  Location: [facility];  Service: Endoscopy;  Laterality: N/A;   EXPLORATORY LAPAROTOMY  04/2000   "for ruptured colon"   HEMATOMA EVACUATION  09/2011   LLE   LUMBAR LAMINECTOMY/DECOMPRESSION  MICRODISCECTOMY  09/03/2011   Procedure: LUMBAR LAMINECTOMY/DECOMPRESSION MICRODISCECTOMY 2 LEVELS;  Surgeon: Ferris Hua, MD;  Location: [facility];  Service: Neurosurgery;  Laterality: Left;  Left Lumbar Three-Four, Lumbar Four-Five Decompressive Laminectomy   SALIVARY GLAND SURGERY  ~ 1980   1 removed   SHOULDER SURGERY  2008   right shoulder and collar bone   SIGMOID RESECTION / RECTOPEXY  09/1999   Dr Nolon Baxter   TRIGGER FINGER RELEASE  2004   left thumb   TUMOR REMOVAL  1960's   left leg   UPPER GASTROINTESTINAL ENDOSCOPY      FAMILY HISTORY: Family History  Problem Relation Age of Onset   Stroke Mother    Hypertension Mother    Stroke Father    Hypertension Father    Diabetes Father    Pancreatic cancer Sister    Diabetes Sister        x 2   Colon cancer Sister 32   Pancreatic cancer Sister    Heart attack Neg Hx    Stomach cancer Neg Hx     SOCIAL HISTORY: Social History   Socioeconomic History   Marital status: Widowed    Spouse name: Not on file   Number of children: 1   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   Highest education level: Not on file  Occupational History    Employer: RETIRED  Tobacco Use   Smoking status: Never   Smokeless tobacco: Never  Vaping Use   Vaping status: Never Used  Substance and Sexual Activity   Alcohol use: No   Drug use: No   Sexual activity: Not Currently    Partners: Male    Birth control/protection: Post-menopausal  Other Topics Concern   Not on file  Social History Narrative   Lives at home alone since spouse died in 2019-06-22, has neighbor for assistance   Right-handed   Drinks no caffeine   Social Drivers of Corporate investment banker Strain: Not on file  Food Insecurity: Not on file  Transportation Needs: Not on file  Physical Activity: Not on file  Stress: Not on file  Social  Connections: Not on file  Intimate Partner Violence: Not on file   PHYSICAL EXAM  Vitals:   08/13/23 1417 08/13/23 1419  08/13/23 1422  BP: (!) 168/66  (!) 161/72  Pulse:  (!) 57   Resp:  16   SpO2:  98%   Height:  4\' 11"  (1.499 m)    Body mass index is 32.4 kg/m.  Generalized: Well developed, in no acute distress  Neurological examination  Mentation: Alert oriented to time, place, history taking. Follows all commands speech and language fluent Cranial nerve II-XII: Pupils were equal round reactive to light. Extraocular movements were full, visual field were full on confrontational test. Facial sensation and strength were normal. Head turning and shoulder shrug  were normal and symmetric. Motor: The motor testing reveals 5 over 5 strength of all 4 extremities. Good symmetric motor tone is noted throughout.  Sensory: Sensory testing is intact to soft touch on all 4 extremities. No evidence of extinction is noted.  Coordination: Cerebellar testing reveals good finger-nose-finger and heel-to-shin bilaterally.  Gait and station: Gait is forward leaning, but steady, uses cane  Reflexes: Deep tendon reflexes are symmetric  DIAGNOSTIC DATA (LABS, IMAGING, TESTING) - I reviewed patient records, labs, notes, testing and imaging myself where available.  Lab Results  Component Value Date   WBC 7.3 08/24/2020   HGB 13.5 08/24/2020   HCT 41.6 08/24/2020   MCV 86.5 08/24/2020   PLT 245 08/24/2020      Component Value Date/Time   NA 139 08/24/2020 2006   NA 138 06/06/2019 1300   K 3.7 08/24/2020 2006   CL 104 08/24/2020 2006   CO2 28 08/24/2020 2006   GLUCOSE 89 08/24/2020 2006   BUN 17 08/24/2020 2006   BUN 20 06/06/2019 1300   CREATININE 1.00 08/24/2020 2006   CREATININE 0.93 01/21/2016 1237   CALCIUM  9.3 08/24/2020 2006   PROT 7.0 02/13/2022 1156   ALBUMIN 4.7 02/13/2022 1156   AST 17 02/13/2022 1156   ALT 10 02/13/2022 1156   ALKPHOS 66 02/13/2022 1156   BILITOT 1.0 02/13/2022 1156   GFRNONAA 57 (L) 08/24/2020 2006   GFRAA 61 06/06/2019 1300   Lab Results  Component Value Date   CHOL 236 (H)  02/13/2022   HDL 77 02/13/2022   LDLCALC 144 (H) 02/13/2022   TRIG 87 02/13/2022   CHOLHDL 3.1 02/13/2022   No results found for: "HGBA1C" Lab Results  Component Value Date   VITAMINB12 188 (L) 02/06/2011   Lab Results  Component Value Date   TSH 2.569 02/13/2016    ASSESSMENT AND PLAN 83 y.o. year old female   1. Migraine headache   - Doing very well, headaches under excellent control, has not had a headache in several months - Continue Topamax  50 mg daily as preventative, we discussed reducing the dose, she wishes to stay as is - Continue Ubrelvy  for acute headache, may combine with Phenergan  as needed for nausea with headache - Follow-up 1 year or sooner if needed  Jeanmarie Millet, Maritza Sidles, DNP 08/13/2023, 2:24 PM [facility] [address], [address] [place], [place] [address] [phone]

## 2023-08-19 ENCOUNTER — Other Ambulatory Visit (HOSPITAL_COMMUNITY): Payer: Self-pay

## 2023-08-19 ENCOUNTER — Telehealth: Payer: Self-pay

## 2023-08-19 NOTE — Telephone Encounter (Signed)
 [name] from [facility] has called to provide approval info for Ubrelvy  effective date 08/19/23-/08/18/24 call back # [phone] option 5

## 2023-08-19 NOTE — Telephone Encounter (Signed)
 Pharmacy Patient Advocate Encounter   Received notification from CoverMyMeds that prior authorization for Ubrelvy  50MG  tablets is required/requested.   Insurance verification completed.   The patient is insured through [facility]   Per test claim: PA required; PA submitted to above mentioned insurance via CoverMyMeds Key/confirmation #/EOC [id number] Status is pending

## 2023-08-21 ENCOUNTER — Other Ambulatory Visit (HOSPITAL_COMMUNITY): Payer: Self-pay

## 2023-08-21 NOTE — Telephone Encounter (Signed)
 Pharmacy Patient Advocate Encounter  Received notification from [facility] that Prior Authorization for Ubrelvy  50MG  tablets has been APPROVED from 08/19/2023 to 08/18/2024. Ran test claim, Copay is $45.00 per 30DS/16 Tablets. This test claim was processed through [facility]- copay amounts may vary at other pharmacies due to pharmacy/plan contracts, or as the patient moves through the different stages of their insurance plan.   PA #/Case ID/Reference #: PA Case ID #: [id number]

## 2023-08-28 DIAGNOSIS — S2096XA Insect bite (nonvenomous) of unspecified parts of thorax, initial encounter: Secondary | ICD-10-CM | POA: Diagnosis not present

## 2023-08-28 DIAGNOSIS — W57XXXA Bitten or stung by nonvenomous insect and other nonvenomous arthropods, initial encounter: Secondary | ICD-10-CM | POA: Diagnosis not present

## 2023-08-28 DIAGNOSIS — S80861A Insect bite (nonvenomous), right lower leg, initial encounter: Secondary | ICD-10-CM | POA: Diagnosis not present

## 2023-09-04 DIAGNOSIS — E538 Deficiency of other specified B group vitamins: Secondary | ICD-10-CM | POA: Diagnosis not present

## 2023-09-08 DIAGNOSIS — Z6837 Body mass index (BMI) 37.0-37.9, adult: Secondary | ICD-10-CM | POA: Diagnosis not present

## 2023-09-08 DIAGNOSIS — R300 Dysuria: Secondary | ICD-10-CM | POA: Diagnosis not present

## 2023-09-08 DIAGNOSIS — R310 Gross hematuria: Secondary | ICD-10-CM | POA: Diagnosis not present

## 2023-09-21 DIAGNOSIS — R310 Gross hematuria: Secondary | ICD-10-CM | POA: Diagnosis not present

## 2023-09-21 DIAGNOSIS — R300 Dysuria: Secondary | ICD-10-CM | POA: Diagnosis not present

## 2023-10-06 DIAGNOSIS — E538 Deficiency of other specified B group vitamins: Secondary | ICD-10-CM | POA: Diagnosis not present

## 2023-10-11 DIAGNOSIS — T148XXA Other injury of unspecified body region, initial encounter: Secondary | ICD-10-CM | POA: Diagnosis not present

## 2023-10-11 DIAGNOSIS — T63481A Toxic effect of venom of other arthropod, accidental (unintentional), initial encounter: Secondary | ICD-10-CM | POA: Diagnosis not present

## 2023-10-12 DIAGNOSIS — M25551 Pain in right hip: Secondary | ICD-10-CM | POA: Diagnosis not present

## 2023-10-13 DIAGNOSIS — T148XXA Other injury of unspecified body region, initial encounter: Secondary | ICD-10-CM | POA: Diagnosis not present

## 2023-10-19 ENCOUNTER — Ambulatory Visit: Admitting: Physician Assistant

## 2023-10-19 ENCOUNTER — Encounter: Payer: Self-pay | Admitting: Physician Assistant

## 2023-10-19 VITALS — BP 176/77

## 2023-10-19 DIAGNOSIS — S80861A Insect bite (nonvenomous), right lower leg, initial encounter: Secondary | ICD-10-CM

## 2023-10-19 DIAGNOSIS — R21 Rash and other nonspecific skin eruption: Secondary | ICD-10-CM

## 2023-10-19 DIAGNOSIS — W57XXXD Bitten or stung by nonvenomous insect and other nonvenomous arthropods, subsequent encounter: Secondary | ICD-10-CM

## 2023-10-19 DIAGNOSIS — S90561D Insect bite (nonvenomous), right ankle, subsequent encounter: Secondary | ICD-10-CM

## 2023-10-19 NOTE — Patient Instructions (Signed)

## 2023-10-19 NOTE — Progress Notes (Signed)
   New Patient Visit   Subjective  [name] is a 83 y.o. female who presents for the following: Spot on her bottom that has been there over a year. It does not come and go, it's always there. She thinks it may be getting better because it took her so long to get an appointment.  Other concern: blister on her right ankle. Saw Urgent Care and they prescribed triamcinolone  and mupirocin. She thinks it is getting better.      The following portions of the chart were reviewed this encounter and updated as appropriate: medications, allergies, medical history  Review of Systems:  No other skin or systemic complaints except as noted in HPI or Assessment and Plan.  Objective  Well appearing patient in no apparent distress; mood and affect are within normal limits.   A focused examination was performed of the following areas: buttock, right lower leg   Relevant exam findings are noted in the Assessment and Plan.    Assessment & Plan   Rash Exam: Fading pink patch. left inner buttock  Treatment Plan: No treatment Call if recurs.    Insect Bite Reaction Exam: Healing blister of right medial ankle  Treatment Plan: Continue mupirocin daily until healed.  INSECT BITE OF RIGHT LOWER LEG, INITIAL ENCOUNTER   RASH AND OTHER NONSPECIFIC SKIN ERUPTION    Return if symptoms worsen or fail to improve.  I, Roseline Hutchinson, CMA, am acting as scribe for Jameelah Watts K, PA-C .   Documentation: I have reviewed the above documentation for accuracy and completeness, and I agree with the above.  Jahne Krukowski K, PA-C

## 2023-10-29 DIAGNOSIS — M419 Scoliosis, unspecified: Secondary | ICD-10-CM | POA: Diagnosis not present

## 2023-10-29 DIAGNOSIS — M160 Bilateral primary osteoarthritis of hip: Secondary | ICD-10-CM | POA: Diagnosis not present

## 2023-10-29 DIAGNOSIS — M170 Bilateral primary osteoarthritis of knee: Secondary | ICD-10-CM | POA: Diagnosis not present

## 2023-11-05 DIAGNOSIS — E538 Deficiency of other specified B group vitamins: Secondary | ICD-10-CM | POA: Diagnosis not present

## 2023-11-16 DIAGNOSIS — M25551 Pain in right hip: Secondary | ICD-10-CM | POA: Diagnosis not present

## 2023-12-07 DIAGNOSIS — E538 Deficiency of other specified B group vitamins: Secondary | ICD-10-CM | POA: Diagnosis not present

## 2023-12-07 DIAGNOSIS — Z23 Encounter for immunization: Secondary | ICD-10-CM | POA: Diagnosis not present

## 2024-01-04 DIAGNOSIS — E538 Deficiency of other specified B group vitamins: Secondary | ICD-10-CM | POA: Diagnosis not present

## 2024-02-02 DIAGNOSIS — Z1331 Encounter for screening for depression: Secondary | ICD-10-CM | POA: Diagnosis not present

## 2024-02-02 DIAGNOSIS — E538 Deficiency of other specified B group vitamins: Secondary | ICD-10-CM | POA: Diagnosis not present

## 2024-02-02 DIAGNOSIS — Z0000 Encounter for general adult medical examination without abnormal findings: Secondary | ICD-10-CM | POA: Diagnosis not present

## 2024-02-16 DIAGNOSIS — M25551 Pain in right hip: Secondary | ICD-10-CM | POA: Diagnosis not present

## 2024-02-23 ENCOUNTER — Other Ambulatory Visit: Payer: Self-pay | Admitting: Medical

## 2024-02-23 DIAGNOSIS — M1711 Unilateral primary osteoarthritis, right knee: Secondary | ICD-10-CM | POA: Diagnosis not present

## 2024-02-23 DIAGNOSIS — M25571 Pain in right ankle and joints of right foot: Secondary | ICD-10-CM

## 2024-02-23 DIAGNOSIS — M25561 Pain in right knee: Secondary | ICD-10-CM | POA: Diagnosis not present

## 2024-02-23 DIAGNOSIS — M25551 Pain in right hip: Secondary | ICD-10-CM | POA: Diagnosis not present

## 2024-02-24 ENCOUNTER — Inpatient Hospital Stay: Admission: RE | Admit: 2024-02-24 | Discharge: 2024-02-24 | Attending: Medical

## 2024-02-24 ENCOUNTER — Ambulatory Visit
Admission: RE | Admit: 2024-02-24 | Discharge: 2024-02-24 | Disposition: A | Discharge status: Home / Self Care | Source: Ambulatory Visit | Attending: Medical | Admitting: Medical

## 2024-02-24 DIAGNOSIS — M25561 Pain in right knee: Secondary | ICD-10-CM

## 2024-02-24 DIAGNOSIS — M1711 Unilateral primary osteoarthritis, right knee: Secondary | ICD-10-CM | POA: Diagnosis not present

## 2024-02-24 DIAGNOSIS — M25571 Pain in right ankle and joints of right foot: Secondary | ICD-10-CM

## 2024-02-26 DIAGNOSIS — H524 Presbyopia: Secondary | ICD-10-CM | POA: Diagnosis not present

## 2024-03-16 ENCOUNTER — Encounter: Payer: Self-pay | Admitting: Cardiovascular Disease

## 2024-03-16 ENCOUNTER — Ambulatory Visit: Admitting: Cardiovascular Disease

## 2024-03-16 VITALS — BP 166/82 | HR 65 | Ht 59.0 in | Wt 154.0 lb

## 2024-03-16 DIAGNOSIS — R002 Palpitations: Secondary | ICD-10-CM

## 2024-03-16 DIAGNOSIS — I10 Essential (primary) hypertension: Secondary | ICD-10-CM

## 2024-03-16 DIAGNOSIS — Z01810 Encounter for preprocedural cardiovascular examination: Secondary | ICD-10-CM

## 2024-03-16 DIAGNOSIS — E785 Hyperlipidemia, unspecified: Secondary | ICD-10-CM

## 2024-03-16 NOTE — Patient Instructions (Signed)

## 2024-03-16 NOTE — Progress Notes (Signed)
 " Cardiology Office Note:    Date:  03/24/2024   ID:  [name], DOB [birth year], MRN [id number]  PCP:  Cleotilde Planas, MD   [facility] Providers Cardiologist:  Ozell Fell, MD     Referring MD: Cleotilde Planas, MD   Chief Complaint  Patient presents with   Hypertension    History of Present Illness:    [name] is a 84 y.o. female with a hx of hypertension, autonomic dysfunction, and mixed hyperlipidemia, presenting for follow-up evaluation.  When I saw her 1 year ago, we noted that her blood pressure was suboptimally controlled.  I recommended that she continue lisinopril  and hydrochlorothiazide  at her current doses, but increase metoprolol  to 25 mg twice daily.  An echocardiogram was completed and showed normal LVEF of 65 to 70% with mild concentric LVH, grade 2 diastolic dysfunction, normal RV size and function, aortic sclerosis without stenosis, and mild mitral regurgitation.  A previous event monitor from 2021 showed no significant arrhythmias.  She is here alone today. Struggling with severe OA of the right hip and pain related to this. She has a lot of body aches and pains with fibromyalgia. Reports BP has been reasonably controlled at home. No chest pain or shortness of breath. No recent palpitations. Denies and cardiac-related concerns.    Current Medications: Active Medications[1]   Allergies:   Metoclopramide , Codeine, Nsaids, and Pravastatin    ROS:   Please see the history of present illness.    All other systems reviewed and are negative.  EKGs/Labs/Other Studies Reviewed:    The following studies were reviewed today: Cardiac Studies & Procedures   ______________________________________________________________________________________________     ECHOCARDIOGRAM  ECHOCARDIOGRAM COMPLETE 04/24/2023  Narrative ECHOCARDIOGRAM REPORT    Patient Name:   [name] Date of Exam: 04/24/2023 Medical Rec #:  [id number]        Height:        48.0 in Accession #:    [id number]       Weight:       160.4 lb Date of Birth:  [birth year]         BSA:          1.445 m Patient Age:    83 years         BP:           134/70 mmHg Patient Gender: F                HR:           65 bpm. Exam Location:  [address]  Procedure: 2D Echo, 3D Echo and Strain Analysis (Both Spectral and Color Flow Doppler were utilized during procedure).  Indications:    R00.2 Palpitations  History:        Patient has prior history of Echocardiogram examinations, most recent 02/13/2016. Signs/Symptoms:Chest Pain; Risk Factors:Hypertension and Dyslipidemia. Obesity. Dyspnea on exertion. Orthostatic hypotension. Tachycardia.  Sonographer:    Jon Hacker RCS Referring Phys: PLANAS CLEOTILDE  IMPRESSIONS   1. Left ventricular ejection fraction, by estimation, is 65 to 70%. Left ventricular ejection fraction by 3D volume is 72 %. The left ventricle has normal function. The left ventricle has no regional wall motion abnormalities. There is mild concentric left ventricular hypertrophy. Left ventricular diastolic parameters are consistent with Grade II diastolic dysfunction (pseudonormalization). The average left ventricular global longitudinal strain is -19.7 %. The global longitudinal strain is normal. 2. Right ventricular systolic function is normal. The right ventricular size is  normal. 3. Left atrial size was moderately dilated. 4. The mitral valve is normal in structure. Mild mitral valve regurgitation. No evidence of mitral stenosis. 5. The aortic valve is tricuspid. Aortic valve regurgitation is not visualized. Aortic valve sclerosis/calcification is present, without any evidence of aortic stenosis. 6. The inferior vena cava is normal in size with greater than 50% respiratory variability, suggesting right atrial pressure of 3 mmHg.  FINDINGS Left Ventricle: Left ventricular ejection fraction, by estimation, is 65 to 70%. Left ventricular ejection fraction by 3D  volume is 72 %. The left ventricle has normal function. The left ventricle has no regional wall motion abnormalities. The average left ventricular global longitudinal strain is -19.7 %. Strain was performed and the global longitudinal strain is normal. The left ventricular internal cavity size was normal in size. There is mild concentric left ventricular hypertrophy. Left ventricular diastolic parameters are consistent with Grade II diastolic dysfunction (pseudonormalization).  Right Ventricle: The right ventricular size is normal. No increase in right ventricular wall thickness. Right ventricular systolic function is normal.  Left Atrium: Left atrial size was moderately dilated.  Right Atrium: Right atrial size was normal in size.  Pericardium: There is no evidence of pericardial effusion.  Mitral Valve: The mitral valve is normal in structure. Mild mitral valve regurgitation. No evidence of mitral valve stenosis.  Tricuspid Valve: The tricuspid valve is normal in structure. Tricuspid valve regurgitation is mild . No evidence of tricuspid stenosis.  Aortic Valve: The aortic valve is tricuspid. Aortic valve regurgitation is not visualized. Aortic valve sclerosis/calcification is present, without any evidence of aortic stenosis.  Pulmonic Valve: The pulmonic valve was normal in structure. Pulmonic valve regurgitation is mild. No evidence of pulmonic stenosis.  Aorta: The aortic root is normal in size and structure.  Venous: The inferior vena cava is normal in size with greater than 50% respiratory variability, suggesting right atrial pressure of 3 mmHg.  IAS/Shunts: No atrial level shunt detected by color flow Doppler.  Additional Comments: 3D was performed not requiring image post processing on an independent workstation and was normal.   LEFT VENTRICLE PLAX 2D LVIDd:         3.50 cm         Diastology LVIDs:         2.10 cm         LV e' medial:    5.98 cm/s LV PW:         0.90 cm          LV E/e' medial:  19.4 LV IVS:        1.00 cm         LV e' lateral:   8.38 cm/s LVOT diam:     2.00 cm         LV E/e' lateral: 13.8 LV SV:         81 LV SV Index:   56              2D LVOT Area:     3.14 cm        Longitudinal Strain 2D Strain GLS  -19.7 % Avg:  3D Volume EF LV 3D EF:    Left ventricul ar ejection fraction by 3D volume is 72 %.  3D Volume EF: 3D EF:        72 % LV EDV:       105 ml LV ESV:       30 ml LV SV:  76 ml  RIGHT VENTRICLE RV Basal diam:  2.90 cm RV S prime:     15.40 cm/s TAPSE (M-mode): 2.3 cm RVSP:           23.8 mmHg  LEFT ATRIUM             Index        RIGHT ATRIUM           Index LA diam:        4.10 cm 2.84 cm/m   RA Pressure: 3.00 mmHg LA Vol (A2C):   41.4 ml 28.64 ml/m  RA Area:     13.10 cm LA Vol (A4C):   63.9 ml 44.21 ml/m  RA Volume:   30.80 ml  21.31 ml/m LA Biplane Vol: 52.5 ml 36.32 ml/m AORTIC VALVE             PULMONIC VALVE LVOT Vmax:   109.00 cm/s PR End Diast Vel: 3.87 msec LVOT Vmean:  68.700 cm/s LVOT VTI:    0.258 m  AORTA Ao Root diam: 2.30 cm Ao Asc diam:  3.40 cm  MITRAL VALVE                TRICUSPID VALVE MV Area (PHT): 5.27 cm     TR Peak grad:   20.8 mmHg MV Decel Time: 144 msec     TR Vmax:        228.00 cm/s MV E velocity: 116.00 cm/s  Estimated RAP:  3.00 mmHg MV A velocity: 90.70 cm/s   RVSP:           23.8 mmHg MV E/A ratio:  1.28 SHUNTS Systemic VTI:  0.26 m Systemic Diam: 2.00 cm  Toribio Fuel MD Electronically signed by Toribio Fuel MD Signature Date/Time: 04/24/2023/2:41:49 PM    Final    MONITORS  CARDIAC EVENT MONITOR 01/05/2020  Narrative 1. The basic rhythm is normal sinus with an average HR of 63 (53-106) bpm 2. No atrial fibrillation or flutter 3. No high-grade heart block or pathologic pauses 4. There are no PVC's and rare supraventricular beats without sustained arrhythmias  Benign event monitor with no significant arrhythmia identified.  Some periods of artifact.       ______________________________________________________________________________________________      EKG:   EKG Interpretation Date/Time:  Wednesday March 16 2024 10:29:38 EST Ventricular Rate:  65 PR Interval:  212 QRS Duration:  76 QT Interval:  390 QTC Calculation: 405 R Axis:   -26  Text Interpretation: Sinus rhythm with 1st degree A-V block Possible Anterior infarct (cited on or before 07-Apr-2023) When compared with ECG of 07-Apr-2023 14:53, Criteria for Inferior infarct are no longer Present no other significant change Confirmed by Wonda Sharper [phone]) on 03/16/2024 10:38:53 AM    Recent Labs: 03/17/2024: ALT 10 03/18/2024: Hemoglobin 12.1; Platelets 261 03/22/2024: BUN 33; Creatinine, Ser 0.98; Potassium 3.7; Sodium 136  Recent Lipid Panel    Component Value Date/Time   CHOL 236 (H) 02/13/2022 1156   TRIG 87 02/13/2022 1156   HDL 77 02/13/2022 1156   CHOLHDL 3.1 02/13/2022 1156   CHOLHDL 4.5 06/06/2007 0040   VLDL 20 06/06/2007 0040   LDLCALC 144 (H) 02/13/2022 1156     Physical Exam:    VS:  BP (!) 166/82   Pulse 65   Ht 4' 11 (1.499 m)   Wt 154 lb (69.9 kg)   LMP  (LMP Unknown)   SpO2 98%   BMI 31.10 kg/m     Wt Readings  from Last 3 Encounters:  03/17/24 154 lb (69.9 kg)  03/16/24 154 lb (69.9 kg)  04/07/23 160 lb 6.4 oz (72.8 kg)     GEN:  Well nourished, well developed in no acute distress HEENT: Normal NECK: No JVD; No carotid bruits LYMPHATICS: No lymphadenopathy CARDIAC: RRR, 2/6 SEM at the RUSB RESPIRATORY:  Clear to auscultation without rales, wheezing or rhonchi  ABDOMEN: Soft, non-tender, non-distended MUSCULOSKELETAL:  No edema; No deformity  SKIN: Warm and dry NEUROLOGIC:  Alert and oriented x 3 PSYCHIATRIC:  Normal affect   Assessment & Plan Essential hypertension BP elevated today but hasn't taken meds and struggling with pain. Continue metoprolol  and losartan  at current doses.   Palpitations Well controlled on metoprolol . Preop cardiovascular exam Pt may require right hip surgery/THA. She has no cardiac-related symptoms and no recent angina/heart failure signs of symptoms/arrhythmia. Echo shows normal LV/RV function and no significant valvular disease. She is not at high risk of CV complications with hip replacement and could proceed without preoperative CV testing if she is felt to be a surgical candidate.      Medication Adjustments/Labs and Tests Ordered: Current medicines are reviewed at length with the patient today.  Concerns regarding medicines are outlined above.  Orders Placed This Encounter  Procedures   EKG 12-Lead   No orders of the defined types were placed in this encounter.   Patient Instructions  Medication Instructions:  No medication changes were made at this visit. Continue current regimen.   *If you need a refill on your cardiac medications before your next appointment, please call your pharmacy*  Lab Work: None ordered today. If you have labs (blood work) drawn today and your tests are completely normal, you will receive your results only by: MyChart Message (if you have MyChart) OR A paper copy in the mail If you have any lab test that is abnormal or we need to change your treatment, we will call you to review the results.  Testing/Procedures: None ordered today.  Follow-Up: At [facility], you and your health needs are our priority.  As part of our continuing mission to provide you with exceptional heart care, our providers are all part of one team.  This team includes your primary Cardiologist (physician) and Advanced Practice Providers or APPs (Physician Assistants and Nurse Practitioners) who all work together to provide you with the care you need, when you need it.  Your next appointment:   1 year(s)  Provider:   Ozell Fell, MD     Signed, Ozell Fell, MD  03/24/2024 6:50 AM    [facility]      [1]  Current Meds  Medication Sig   albuterol  (PROVENTIL  HFA;VENTOLIN  HFA) 108 (90 Base) MCG/ACT inhaler Inhale 2 puffs into the lungs every 6 (six) hours as needed for wheezing or shortness of breath.   cholecalciferol  (VITAMIN D3) 25 MCG (1000 UNIT) tablet Take 1,000 Units by mouth daily.   CONTOUR NEXT TEST test strip daily.   cyanocobalamin  (,VITAMIN B-12,) 1000 MCG/ML injection Inject 1,000 mcg into the muscle every 30 (thirty) days.   Dextran 70-Hypromellose ([employer] NATURAL TEARS OP) Apply 1-2 drops to eye daily as needed (dry eyes).   fluticasone  (FLONASE ) 50 MCG/ACT nasal spray Place 1 spray into both nostrils daily as needed for allergies.   hydrochlorothiazide  (HYDRODIURIL ) 25 MG tablet Take 1 tablet (25 mg total) by mouth daily.   losartan  (COZAAR ) 25 MG tablet Take 75 mg by mouth daily.   metoprolol  tartrate (  LOPRESSOR ) 25 MG tablet Take 1 tablet (25 mg total) by mouth 2 (two) times daily.   promethazine  (PHENERGAN ) 25 MG tablet Take 1 tablet (25 mg total) by mouth every 6 (six) hours as needed for nausea or vomiting.   topiramate  (TOPAMAX ) 25 MG tablet Take 2 tablets at night   Ubrogepant  (UBRELVY ) 50 MG TABS Take 1 tablet (50 mg total) by mouth 2 (two) times daily as needed. (Patient taking differently: Take 50 mg by mouth 2 (two) times daily as needed (Migraine / Headache).)   [DISCONTINUED] acetaminophen  (TYLENOL ) 500 MG tablet Take 500 mg by mouth daily as needed for moderate pain.   [DISCONTINUED] augmented betamethasone dipropionate (DIPROLENE-AF) 0.05 % cream as needed.   [DISCONTINUED] Blood Glucose Monitoring Suppl (CONTOUR NEXT EZ) w/Device KIT as directed to test blood sugars once daily (Z88.6700)   [DISCONTINUED] calcium  carbonate (SUPER CALCIUM ) 1500 (600 Ca) MG TABS tablet daily.   [DISCONTINUED] calcium  carbonate (TUMS EX) 750 MG chewable tablet Chew 2 tablets by mouth daily as needed for heartburn.    [DISCONTINUED] Microlet Lancets MISC as directed to test blood  sugars once daily(E11.3299)   [DISCONTINUED] neomycin-bacitracin -polymyxin (NEOSPORIN) ointment Apply 1 application topically as needed for wound care.   [DISCONTINUED] polyethylene glycol (MIRALAX  / GLYCOLAX ) packet Take 17 g by mouth daily as needed for mild constipation.   [DISCONTINUED] rosuvastatin  (CRESTOR ) 5 MG tablet TAKE 1 TABLET BY MOUTH 3 TIMES A WEEK (Patient not taking: Reported on 03/18/2024)   "

## 2024-03-17 ENCOUNTER — Emergency Department (HOSPITAL_COMMUNITY)

## 2024-03-17 ENCOUNTER — Other Ambulatory Visit: Payer: Self-pay

## 2024-03-17 ENCOUNTER — Observation Stay (HOSPITAL_COMMUNITY)
Admission: EM | Admit: 2024-03-17 | Discharge: 2024-03-23 | Disposition: A | Discharge status: Skilled Nursing Facility | Attending: Family Medicine | Admitting: Family Medicine

## 2024-03-17 ENCOUNTER — Encounter (HOSPITAL_COMMUNITY): Payer: Self-pay

## 2024-03-17 DIAGNOSIS — Z6831 Body mass index (BMI) 31.0-31.9, adult: Secondary | ICD-10-CM | POA: Insufficient documentation

## 2024-03-17 DIAGNOSIS — E782 Mixed hyperlipidemia: Secondary | ICD-10-CM | POA: Diagnosis not present

## 2024-03-17 DIAGNOSIS — Z79899 Other long term (current) drug therapy: Secondary | ICD-10-CM | POA: Insufficient documentation

## 2024-03-17 DIAGNOSIS — M545 Low back pain, unspecified: Principal | ICD-10-CM | POA: Insufficient documentation

## 2024-03-17 DIAGNOSIS — R42 Dizziness and giddiness: Secondary | ICD-10-CM | POA: Diagnosis present

## 2024-03-17 DIAGNOSIS — E66811 Obesity, class 1: Secondary | ICD-10-CM | POA: Diagnosis not present

## 2024-03-17 DIAGNOSIS — M1611 Unilateral primary osteoarthritis, right hip: Secondary | ICD-10-CM

## 2024-03-17 DIAGNOSIS — M48061 Spinal stenosis, lumbar region without neurogenic claudication: Secondary | ICD-10-CM

## 2024-03-17 DIAGNOSIS — M25552 Pain in left hip: Secondary | ICD-10-CM | POA: Insufficient documentation

## 2024-03-17 DIAGNOSIS — E785 Hyperlipidemia, unspecified: Secondary | ICD-10-CM | POA: Insufficient documentation

## 2024-03-17 DIAGNOSIS — I10 Essential (primary) hypertension: Principal | ICD-10-CM | POA: Insufficient documentation

## 2024-03-17 DIAGNOSIS — G43909 Migraine, unspecified, not intractable, without status migrainosus: Secondary | ICD-10-CM | POA: Diagnosis not present

## 2024-03-17 DIAGNOSIS — I160 Hypertensive urgency: Secondary | ICD-10-CM | POA: Diagnosis not present

## 2024-03-17 DIAGNOSIS — M5459 Other low back pain: Secondary | ICD-10-CM | POA: Diagnosis present

## 2024-03-17 DIAGNOSIS — M25551 Pain in right hip: Secondary | ICD-10-CM | POA: Diagnosis not present

## 2024-03-17 LAB — COMPREHENSIVE METABOLIC PANEL WITH GFR
ALT: 10 U/L (ref 0–44)
AST: 22 U/L (ref 15–41)
Albumin: 4.2 g/dL (ref 3.5–5.0)
Alkaline Phosphatase: 93 U/L (ref 38–126)
Anion gap: 11 (ref 5–15)
BUN: 27 mg/dL — ABNORMAL HIGH (ref 8–23)
CO2: 25 mmol/L (ref 22–32)
Calcium: 9.2 mg/dL (ref 8.9–10.3)
Chloride: 102 mmol/L (ref 98–111)
Creatinine, Ser: 0.99 mg/dL (ref 0.44–1.00)
GFR, Estimated: 57 mL/min — ABNORMAL LOW
Glucose, Bld: 90 mg/dL (ref 70–99)
Potassium: 3.7 mmol/L (ref 3.5–5.1)
Sodium: 138 mmol/L (ref 135–145)
Total Bilirubin: 0.6 mg/dL (ref 0.0–1.2)
Total Protein: 7 g/dL (ref 6.5–8.1)

## 2024-03-17 LAB — URINALYSIS, W/ REFLEX TO CULTURE (INFECTION SUSPECTED)
Bilirubin Urine: NEGATIVE
Glucose, UA: NEGATIVE mg/dL
Hgb urine dipstick: NEGATIVE
Ketones, ur: NEGATIVE mg/dL
Leukocytes,Ua: NEGATIVE
Nitrite: NEGATIVE
Protein, ur: NEGATIVE mg/dL
Specific Gravity, Urine: 1.01 (ref 1.005–1.030)
pH: 6 (ref 5.0–8.0)

## 2024-03-17 LAB — CBC WITH DIFFERENTIAL/PLATELET
Abs Immature Granulocytes: 0.07 K/uL (ref 0.00–0.07)
Basophils Absolute: 0.1 K/uL (ref 0.0–0.1)
Basophils Relative: 1 %
Eosinophils Absolute: 0.1 K/uL (ref 0.0–0.5)
Eosinophils Relative: 1 %
HCT: 42 % (ref 36.0–46.0)
Hemoglobin: 13.7 g/dL (ref 12.0–15.0)
Immature Granulocytes: 1 %
Lymphocytes Relative: 20 %
Lymphs Abs: 2.5 K/uL (ref 0.7–4.0)
MCH: 28.7 pg (ref 26.0–34.0)
MCHC: 32.6 g/dL (ref 30.0–36.0)
MCV: 87.9 fL (ref 80.0–100.0)
Monocytes Absolute: 1.2 K/uL — ABNORMAL HIGH (ref 0.1–1.0)
Monocytes Relative: 10 %
Neutro Abs: 8.2 K/uL — ABNORMAL HIGH (ref 1.7–7.7)
Neutrophils Relative %: 67 %
Platelets: 321 K/uL (ref 150–400)
RBC: 4.78 MIL/uL (ref 3.87–5.11)
RDW: 13.3 % (ref 11.5–15.5)
WBC: 12.1 K/uL — ABNORMAL HIGH (ref 4.0–10.5)
nRBC: 0 % (ref 0.0–0.2)

## 2024-03-17 LAB — TROPONIN T, HIGH SENSITIVITY: Troponin T High Sensitivity: 23 ng/L — ABNORMAL HIGH (ref 0–19)

## 2024-03-17 MED ORDER — LIDOCAINE 5 % EX PTCH
2.0000 | MEDICATED_PATCH | Freq: Every day | CUTANEOUS | Status: DC
  Administered 2024-03-18 (×2): 2 via TRANSDERMAL
  Administered 2024-03-19: 1 via TRANSDERMAL
  Administered 2024-03-20 – 2024-03-22 (×3): 2 via TRANSDERMAL
  Filled 2024-03-17 (×6): qty 2

## 2024-03-17 MED ORDER — SODIUM CHLORIDE 0.9 % IV BOLUS
1000.0000 mL | Freq: Once | INTRAVENOUS | Status: AC
  Administered 2024-03-18: 1000 mL via INTRAVENOUS

## 2024-03-17 MED ORDER — ONDANSETRON HCL 4 MG/2ML IJ SOLN
4.0000 mg | Freq: Four times a day (QID) | INTRAMUSCULAR | Status: DC | PRN
  Administered 2024-03-20: 4 mg via INTRAVENOUS
  Filled 2024-03-17: qty 2

## 2024-03-17 MED ORDER — ONDANSETRON HCL 4 MG/2ML IJ SOLN
4.0000 mg | Freq: Once | INTRAMUSCULAR | Status: AC
  Administered 2024-03-17: 4 mg via INTRAVENOUS
  Filled 2024-03-17: qty 2

## 2024-03-17 MED ORDER — HYDROMORPHONE HCL 1 MG/ML IJ SOLN
1.0000 mg | Freq: Once | INTRAMUSCULAR | Status: AC
  Administered 2024-03-17: 1 mg via INTRAVENOUS
  Filled 2024-03-17: qty 1

## 2024-03-17 MED ORDER — ENOXAPARIN SODIUM 40 MG/0.4ML IJ SOSY
40.0000 mg | PREFILLED_SYRINGE | Freq: Every day | INTRAMUSCULAR | Status: DC
  Administered 2024-03-18 – 2024-03-23 (×6): 40 mg via SUBCUTANEOUS
  Filled 2024-03-17 (×6): qty 0.4

## 2024-03-17 MED ORDER — METHOCARBAMOL 500 MG PO TABS: 500.0000 mg | ORAL_TABLET | Freq: Three times a day (TID) | ORAL | Status: DC | PRN

## 2024-03-17 MED ORDER — ACETAMINOPHEN 325 MG PO TABS: 650.0000 mg | ORAL_TABLET | Freq: Four times a day (QID) | ORAL | Status: DC | PRN

## 2024-03-17 MED ORDER — OXYCODONE HCL 5 MG PO TABS: 5.0000 mg | ORAL_TABLET | Freq: Four times a day (QID) | ORAL | Status: DC | PRN

## 2024-03-17 MED ORDER — TRAMADOL HCL 50 MG PO TABS: 50.0000 mg | ORAL_TABLET | Freq: Four times a day (QID) | ORAL | 0 refills | Status: DC | PRN

## 2024-03-17 MED ORDER — SENNOSIDES-DOCUSATE SODIUM 8.6-50 MG PO TABS
1.0000 | ORAL_TABLET | Freq: Every evening | ORAL | Status: DC | PRN
  Administered 2024-03-22: 1 via ORAL
  Filled 2024-03-17: qty 1

## 2024-03-17 MED ORDER — CYCLOBENZAPRINE HCL 10 MG PO TABS
5.0000 mg | ORAL_TABLET | Freq: Once | ORAL | Status: AC
  Administered 2024-03-17: 5 mg via ORAL
  Filled 2024-03-17: qty 1

## 2024-03-17 MED ORDER — METHYLPREDNISOLONE SODIUM SUCC 125 MG IJ SOLR
125.0000 mg | Freq: Once | INTRAMUSCULAR | Status: AC
  Administered 2024-03-17: 125 mg via INTRAVENOUS
  Filled 2024-03-17: qty 2

## 2024-03-17 MED ORDER — HYDROMORPHONE HCL 1 MG/ML IJ SOLN
0.5000 mg | Freq: Four times a day (QID) | INTRAMUSCULAR | Status: DC | PRN
  Administered 2024-03-18: 0.5 mg via INTRAVENOUS
  Filled 2024-03-17: qty 1

## 2024-03-17 MED ORDER — ACETAMINOPHEN 500 MG PO TABS
1000.0000 mg | ORAL_TABLET | Freq: Three times a day (TID) | ORAL | Status: DC
  Administered 2024-03-18 – 2024-03-23 (×16): 1000 mg via ORAL
  Filled 2024-03-17 (×17): qty 2

## 2024-03-17 MED ORDER — ACETAMINOPHEN 650 MG RE SUPP: 650.0000 mg | Freq: Four times a day (QID) | RECTAL | Status: DC | PRN

## 2024-03-17 MED ORDER — MORPHINE SULFATE (PF) 4 MG/ML IV SOLN
4.0000 mg | Freq: Once | INTRAVENOUS | Status: AC
  Administered 2024-03-17: 4 mg via INTRAVENOUS
  Filled 2024-03-17: qty 1

## 2024-03-17 MED ORDER — BISACODYL 5 MG PO TBEC
5.0000 mg | DELAYED_RELEASE_TABLET | Freq: Every day | ORAL | Status: DC | PRN
  Administered 2024-03-22: 5 mg via ORAL
  Filled 2024-03-17: qty 1

## 2024-03-17 MED ORDER — ONDANSETRON HCL 4 MG PO TABS: 4.0000 mg | ORAL_TABLET | Freq: Four times a day (QID) | ORAL | Status: DC | PRN

## 2024-03-17 NOTE — ED Notes (Signed)
 ED Provider at bedside.

## 2024-03-17 NOTE — ED Provider Notes (Addendum)
 " [place] EMERGENCY DEPARTMENT AT [facility] Provider Note   CSN: [id number] Arrival date & time: 03/17/24  1604     Patient presents with: Dizziness and Hypertension   [name] is a 84 y.o. female history of hypertension, hyperlipidemia here presenting with hypertension.  Patient states that she was seen at PCP office yesterday for hypertension.  Her losartan  was increased to 75 mg.  She states that she did take her losartan  and her other BP meds as prescribed.  She states that she has a scheduled appointment to get injection of the right hip for hip pain.  She states that she did eat some sausage and hotdog and some food that is high in sodium for lunch.  She states that she went to the office in the afternoon and then noticed that her blood pressure was over 200.  Patient states that she felt slightly dizzy but denies any headache or chest pain.   The history is provided by the patient.       Prior to Admission medications  Medication Sig Start Date End Date Taking? Authorizing Provider  acetaminophen  (TYLENOL ) 500 MG tablet Take 500 mg by mouth daily as needed for moderate pain.    [provider]  albuterol  (PROVENTIL  HFA;VENTOLIN  HFA) 108 (90 Base) MCG/ACT inhaler Inhale 2 puffs into the lungs every 6 (six) hours as needed for wheezing or shortness of breath.    [provider]  augmented betamethasone dipropionate (DIPROLENE-AF) 0.05 % cream as needed.    [provider]  Blood Glucose Monitoring Suppl (CONTOUR NEXT EZ) w/Device KIT as directed to test blood sugars once daily (E11.3299) 07/10/22   [provider]  calcium  carbonate (SUPER CALCIUM ) 1500 (600 Ca) MG TABS tablet daily.    [provider]  calcium  carbonate (TUMS EX) 750 MG chewable tablet Chew 2 tablets by mouth daily as needed for heartburn.     [provider]  cholecalciferol  (VITAMIN D3) 25 MCG (1000 UNIT) tablet Take 1,000 Units by mouth daily.     [provider]  CONTOUR NEXT TEST test strip daily.    [provider]  cyanocobalamin  (,VITAMIN B-12,) 1000 MCG/ML injection Inject 1,000 mcg into the muscle every 30 (thirty) days.    [provider]  Dextran 70-Hypromellose ([employer] NATURAL TEARS OP) Apply 1-2 drops to eye daily as needed (dry eyes).    [provider]  fluticasone  (FLONASE ) 50 MCG/ACT nasal spray Place 1 spray into both nostrils daily as needed for allergies. 08/02/16   [provider]  hydrochlorothiazide  (HYDRODIURIL ) 25 MG tablet Take 1 tablet (25 mg total) by mouth daily. 06/04/23   Wonda Sharper, MD  lisinopril  (ZESTRIL ) 10 MG tablet TAKE 1 TABLET(10 MG) BY MOUTH DAILY Patient not taking: Reported on 03/16/2024 05/05/23   Cooper, Michael, MD  loratadine (CLARITIN) 10 MG tablet Take 10 mg by mouth daily as needed for allergies. Patient not taking: Reported on 03/16/2024    [provider]  losartan  (COZAAR ) 25 MG tablet Take 25 mg by mouth daily.    [provider]  metoprolol  tartrate (LOPRESSOR ) 25 MG tablet Take 1 tablet (25 mg total) by mouth 2 (two) times daily. 04/07/23   Wonda Sharper, MD  Microlet Lancets MISC as directed to test blood sugars once daily(E11.3299) 07/10/22   [provider]  neomycin-bacitracin -polymyxin (NEOSPORIN) ointment Apply 1 application topically as needed for wound care.    [provider]  pantoprazole  (PROTONIX ) 40 MG tablet  TAKE 1 TABLET(40 MG) BY MOUTH TWICE DAILY BEFORE A MEAL Patient not taking: Reported on 03/16/2024 05/02/19   Avram Lupita BRAVO, MD  polyethylene glycol (MIRALAX  / GLYCOLAX ) packet Take 17 g by mouth daily as needed for mild constipation.    [provider]  promethazine  (PHENERGAN ) 25 MG tablet Take 1 tablet (25 mg total) by mouth every 6 (six) hours as needed for nausea or vomiting. 08/13/23   Gayland Lauraine PARAS, NP  rosuvastatin  (CRESTOR ) 5 MG tablet TAKE 1 TABLET BY MOUTH 3 TIMES A WEEK  04/07/23   Wonda Sharper, MD  topiramate  (TOPAMAX ) 25 MG tablet Take 2 tablets at night 08/13/23   Gayland Lauraine PARAS, NP  Ubrogepant  (UBRELVY ) 50 MG TABS Take 1 tablet (50 mg total) by mouth 2 (two) times daily as needed. 08/13/23   Gayland Lauraine PARAS, NP    Allergies: Metoclopramide , Codeine, and Pravastatin     Review of Systems  Musculoskeletal:        Right hip pain   Neurological:  Positive for dizziness.  All other systems reviewed and are negative.   Updated Vital Signs BP (!) 150/86   Pulse 94   Temp 98.4 F (36.9 C)   Resp 12   Ht 4' 11 (1.499 m)   Wt 69.9 kg   LMP  (LMP Unknown)   SpO2 98%   BMI 31.10 kg/m   Physical Exam Vitals and nursing note reviewed.  HENT:     Head: Normocephalic.     Nose: Nose normal.     Mouth/Throat:     Mouth: Mucous membranes are moist.  Eyes:     Extraocular Movements: Extraocular movements intact.     Pupils: Pupils are equal, round, and reactive to light.  Cardiovascular:     Rate and Rhythm: Normal rate and regular rhythm.     Pulses: Normal pulses.     Heart sounds: Normal heart sounds.  Pulmonary:     Effort: Pulmonary effort is normal.     Breath sounds: Normal breath sounds.  Abdominal:     General: Abdomen is flat.     Palpations: Abdomen is soft.  Musculoskeletal:        General: Normal range of motion.     Cervical back: Normal range of motion and neck supple.  Skin:    General: Skin is warm.     Capillary Refill: Capillary refill takes less than 2 seconds.  Neurological:     General: No focal deficit present.     Mental Status: She is alert and oriented to person, place, and time.     Comments: No facial droop, normal strength bilateral arms and legs   Psychiatric:        Mood and Affect: Mood normal.        Behavior: Behavior normal.     (all labs ordered are listed, but only abnormal results are displayed) Labs Reviewed  CBC WITH DIFFERENTIAL/PLATELET - Abnormal; Notable for the following components:       Result Value   WBC 12.1 (*)    Neutro Abs 8.2 (*)    Monocytes Absolute 1.2 (*)    All other components within normal limits  COMPREHENSIVE METABOLIC PANEL WITH GFR  TROPONIN T, HIGH SENSITIVITY  TROPONIN T, HIGH SENSITIVITY    EKG: None  Radiology: No results found.   Procedures   Medications Ordered in the ED  ondansetron  (ZOFRAN ) injection 4 mg (4 mg Intravenous Given 03/17/24 1711)  morphine  (PF) 4 MG/ML injection  4 mg (4 mg Intravenous Given 03/17/24 1712)                                    Medical Decision Making TYLENA PRISK is a 84 y.o. female here presenting with hypertension and dizziness.  This is a chronic problem.  She also ate more salt than usual.  Will check electrolytes and give pain medicine.  Patient took her daily dose of BP meds already   6:53 PM Blood pressure down to the 160s now.  Patient is feeling much better.  Labs unremarkable.  Told her to take her blood pressure meds as prescribed and follow-up with PCP. Will prescribe tramadol  for pain   7pm Upon discharge, patient states that she is in so much pain she cannot even walk.  Upon review of her records and she has been having some chronic back and hip pain.  Plan to get CT of the lumbar spine and pelvis and MRI of the lumbar spine  10:30 PM I reviewed patient's CT and MRIs.  Patient has severe canal stenosis in her lumbar spine.  Patient is still in significant pain after pain meds.  Will admit for observation for pain control  Problems Addressed: Bilateral hip pain: acute illness or injury Hypertension, unspecified type: chronic illness or injury Spinal stenosis of lumbar region, unspecified whether neurogenic claudication present: acute illness or injury  Amount and/or Complexity of Data Reviewed Labs: ordered. Decision-making details documented in ED Course. Radiology: ordered and independent interpretation performed. Decision-making details documented in ED Course. ECG/medicine tests:  ordered.  Risk Prescription drug management. Decision regarding hospitalization.    Final diagnoses:  None    ED Discharge Orders     None          Patt Alm Macho, MD 03/17/24 1854    Patt Alm Macho, MD 03/17/24 2313  "

## 2024-03-17 NOTE — ED Notes (Signed)
 Upon getting patient ready for discharge, patient and patient family member voices concerns about her being discharged. Pt states she does not understand why she is being discharged and wants to talk to the doctor about this. Secure chat sent to MD.

## 2024-03-17 NOTE — H&P (Signed)
 " History and Physical  [name] FMW:[id number] DOB: 1940-09-05 DOA: 03/17/2024  PCP: Cleotilde Planas, MD   Chief Complaint: Elevated blood pressure, back pain, dizziness  HPI: [name] is a 84 y.o. female with medical history significant for HTN, HLD, autonomic dysfunction, chronic back pain, fibromyalgia, scoliosis, GERD, migraine, osteoporosis, CVA, and osteoarthritis of the right hip who presented to the ED for evaluation of elevated blood pressure, back pain and dizziness. Patient reports that she has been dealing with worsening right hip osteoarthritis managed with intermittent steroid injections. She presented [facility] for steroid injections for her chronic hip pain and while attempting the injection, patient became dizzy and was found to have severely elevated BP to 220/100. Her dizziness improved after patient sat up. Patient reports she usually ambulates with a walker but has had difficulty ambulating due to worsening right hip pain and low back pain with movement over the last few weeks. She denies any numbness, tingling, urinary incontinence, bowel incontinence, fevers, chills, recent fall or vision changes.  ED Course: Initial vitals show patient afebrile, HR 80-90s, SBP 160-190s, SpO2 100% on room air. Initial labs significant for WBC 12.1, troponin 23 otherwise normal renal function, LFTs, UA with no signs of infection. EKG shows sinus rhythm. CT head with no acute intercranial abnormality. CT pelvis shows severe degenerative changes of the right hip. MRI L-spine shows severe coronal and subarticular recess stenosis and moderate left foraminal stenosis.  Pt received IV Dilaudid , IV Solu-Medrol , IV morphine , IV Zofran  and Flexeril .  Discharge from the ED was attempted however patient was unable to get up and ambulate on her own due to significant pain. TR[facility] was consulted for admission for pain control.   Review of Systems: Please see HPI for pertinent positives and  negatives. A complete 10 system review of systems are otherwise negative.  Past Medical History:  Diagnosis Date   Allergy    Arthritis    got alot; especially in my spine   Chest pain 02/2016   Chronic back pain    all of it   Classical migraine with intractable migraine 02/25/2016   Diverticulosis of colon (without mention of hemorrhage)    DVT of leg (deep venous thrombosis) (HCC) 09/2011   LLE   Dysrhythmia    notes in epic   Esophageal reflux    Family history of malignant neoplasm of gastrointestinal tract    Fibromyalgia    dx'd w/it when it first came out   Hiatal hernia    Hyperlipidemia    Hypertension    Migraines 09/24/11   often recently;  more controlled now   Obesity, unspecified    Osteoporosis    Palpitations    event monitor 8/13: NSR, Sinus brady, Sinus Tachy, PAC // Event monitor 10/21: NSR, avg HR 63, no AFib/Flutter; no significant arrhythmia   Personal history of colonic polyps 05/31/2007   adenomatous polyp   Pneumonia    3 times at least (09/24/11)   Rheumatoid arthritis(714.0)    Scoliosis    Stricture and stenosis of esophagus    Stroke (HCC) 08/2016   Unspecified gastritis and gastroduodenitis without mention of hemorrhage    Past Surgical History:  Procedure Laterality Date   BACK SURGERY     BREAST CYST INCISION AND DRAINAGE  2012   left; golf-ball sized   BREAST SURGERY     cyst lanced and drainded   COLON SURGERY     COLONOSCOPY  2009   COLONOSCOPY WITH PROPOFOL  N/A  03/16/2017   Procedure: COLONOSCOPY WITH PROPOFOL ;  Surgeon: Avram Lupita BRAVO, MD;  Location: [facility];  Service: Endoscopy;  Laterality: N/A;   ESOPHAGEAL DILATION     several times   ESOPHAGOGASTRODUODENOSCOPY (EGD) WITH PROPOFOL  N/A 03/16/2017   Procedure: ESOPHAGOGASTRODUODENOSCOPY (EGD) WITH PROPOFOL ;  Surgeon: Avram Lupita BRAVO, MD;  Location: [facility];  Service: Endoscopy;  Laterality: N/A;   EXPLORATORY LAPAROTOMY  04/2000   for ruptured colon    HEMATOMA EVACUATION  09/2011   LLE   LUMBAR LAMINECTOMY/DECOMPRESSION MICRODISCECTOMY  09/03/2011   Procedure: LUMBAR LAMINECTOMY/DECOMPRESSION MICRODISCECTOMY 2 LEVELS;  Surgeon: Arley SHAUNNA Helling, MD;  Location: [facility];  Service: Neurosurgery;  Laterality: Left;  Left Lumbar Three-Four, Lumbar Four-Five Decompressive Laminectomy   SALIVARY GLAND SURGERY  ~ 1980   1 removed   SHOULDER SURGERY  2008   right shoulder and collar bone   SIGMOID RESECTION / RECTOPEXY  09/1999   Dr Nicholaus ACE FINGER RELEASE  2004   left thumb   TUMOR REMOVAL  1960's   left leg   UPPER GASTROINTESTINAL ENDOSCOPY     Social History:  reports that she has never smoked. She has never used smokeless tobacco. She reports that she does not drink alcohol and does not use drugs.  Allergies[1]  Family History  Problem Relation Age of Onset   Stroke Mother    Hypertension Mother    Stroke Father    Hypertension Father    Diabetes Father    Pancreatic cancer Sister    Diabetes Sister        x 2   Colon cancer Sister 56   Pancreatic cancer Sister    Heart attack Neg Hx    Stomach cancer Neg Hx      Prior to Admission medications  Medication Sig Start Date End Date Taking? Authorizing Provider  traMADol  (ULTRAM ) 50 MG tablet Take 1 tablet (50 mg total) by mouth every 6 (six) hours as needed. 03/17/24  Yes Patt Alm Macho, MD  acetaminophen  (TYLENOL ) 500 MG tablet Take 500 mg by mouth daily as needed for moderate pain.    [provider]  albuterol  (PROVENTIL  HFA;VENTOLIN  HFA) 108 (90 Base) MCG/ACT inhaler Inhale 2 puffs into the lungs every 6 (six) hours as needed for wheezing or shortness of breath.    [provider]  augmented betamethasone dipropionate (DIPROLENE-AF) 0.05 % cream as needed.    [provider]  Blood Glucose Monitoring Suppl (CONTOUR NEXT EZ) w/Device KIT as directed to test blood sugars once daily (E11.3299) 07/10/22   [provider]  calcium   carbonate (SUPER CALCIUM ) 1500 (600 Ca) MG TABS tablet daily.    [provider]  calcium  carbonate (TUMS EX) 750 MG chewable tablet Chew 2 tablets by mouth daily as needed for heartburn.     [provider]  cholecalciferol  (VITAMIN D3) 25 MCG (1000 UNIT) tablet Take 1,000 Units by mouth daily.    [provider]  CONTOUR NEXT TEST test strip daily.    [provider]  cyanocobalamin  (,VITAMIN B-12,) 1000 MCG/ML injection Inject 1,000 mcg into the muscle every 30 (thirty) days.    [provider]  Dextran 70-Hypromellose ([employer] NATURAL TEARS OP) Apply 1-2 drops to eye daily as needed (dry eyes).    [provider]  fluticasone  (FLONASE ) 50 MCG/ACT nasal spray Place 1 spray into both nostrils daily as needed for allergies. 08/02/16   [provider]  hydrochlorothiazide  (HYDRODIURIL ) 25 MG tablet Take  1 tablet (25 mg total) by mouth daily. 06/04/23   Wonda Sharper, MD  lisinopril  (ZESTRIL ) 10 MG tablet TAKE 1 TABLET(10 MG) BY MOUTH DAILY Patient not taking: Reported on 03/16/2024 05/05/23   Cooper, Michael, MD  loratadine (CLARITIN) 10 MG tablet Take 10 mg by mouth daily as needed for allergies. Patient not taking: Reported on 03/16/2024    [provider]  losartan  (COZAAR ) 25 MG tablet Take 25 mg by mouth daily.    [provider]  metoprolol  tartrate (LOPRESSOR ) 25 MG tablet Take 1 tablet (25 mg total) by mouth 2 (two) times daily. 04/07/23   Wonda Sharper, MD  Microlet Lancets MISC as directed to test blood sugars once daily(E11.3299) 07/10/22   [provider]  neomycin-bacitracin -polymyxin (NEOSPORIN) ointment Apply 1 application topically as needed for wound care.    [provider]  pantoprazole  (PROTONIX ) 40 MG tablet TAKE 1 TABLET(40 MG) BY MOUTH TWICE DAILY BEFORE A MEAL Patient not taking: Reported on 03/16/2024 05/02/19   Avram Lupita BRAVO, MD  polyethylene glycol (MIRALAX  / GLYCOLAX ) packet Take 17 g  by mouth daily as needed for mild constipation.    [provider]  promethazine  (PHENERGAN ) 25 MG tablet Take 1 tablet (25 mg total) by mouth every 6 (six) hours as needed for nausea or vomiting. 08/13/23   Gayland Lauraine PARAS, NP  rosuvastatin  (CRESTOR ) 5 MG tablet TAKE 1 TABLET BY MOUTH 3 TIMES A WEEK 04/07/23   Wonda Sharper, MD  topiramate  (TOPAMAX ) 25 MG tablet Take 2 tablets at night 08/13/23   Gayland Lauraine PARAS, NP  Ubrogepant  (UBRELVY ) 50 MG TABS Take 1 tablet (50 mg total) by mouth 2 (two) times daily as needed. 08/13/23   Gayland Lauraine PARAS, NP    Physical Exam: BP (!) 164/68   Pulse 88   Temp 97.7 F (36.5 C) (Oral)   Resp 20   Ht 4' 11 (1.499 m)   Wt 69.9 kg   LMP  (LMP Unknown)   SpO2 98%   BMI 31.10 kg/m  General: Pleasant, drowsy appearing elderly woman laying in bed. No acute distress. HEENT: [place]/AT. Anicteric sclera. Dry mucous membrane. CV: RRR. No murmurs, rubs, or gallops. Pulmonary: Lungs CTAB. Normal effort. No wheezing or rales. Abdominal: Soft, nontender, nondistended. Normal bowel sounds. MSK: Mild tenderness to palpation of the L-spine and right hip.  ROM of the hip and back limited due to pain. Skin: Warm and dry. Small blister on left lower leg covered with dressing Neuro: Drowsy but oriented x 3. Moves all extremities. Normal sensation to light touch. No focal deficit. Psych: Normal mood and affect          Labs on Admission:  Basic Metabolic Panel: Recent Labs  Lab 03/17/24 1715  NA 138  K 3.7  CL 102  CO2 25  GLUCOSE 90  BUN 27*  CREATININE 0.99  CALCIUM  9.2   Liver Function Tests: Recent Labs  Lab 03/17/24 1715  AST 22  ALT 10  ALKPHOS 93  BILITOT 0.6  PROT 7.0  ALBUMIN 4.2   No results for input(s): LIPASE, AMYLASE in the last 168 hours. No results for input(s): AMMONIA in the last 168 hours. CBC: Recent Labs  Lab 03/17/24 1715  WBC 12.1*  NEUTROABS 8.2*  HGB 13.7  HCT 42.0  MCV 87.9  PLT 321   Cardiac Enzymes: No  results for input(s): CKTOTAL, CKMB, CKMBINDEX, TROPONINI in the last 168 hours. BNP (last 3 results) No results for input(s): BNP  in the last 8760 hours.  ProBNP (last 3 results) No results for input(s): PROBNP in the last 8760 hours.  CBG: No results for input(s): GLUCAP in the last 168 hours.  Radiological Exams on Admission: MR LUMBAR SPINE WO CONTRAST Result Date: 03/17/2024 EXAM: MRI LUMBAR SPINE 03/17/2024 08:30:13 PM TECHNIQUE: Multiplanar multisequence MRI of the lumbar spine was performed without the administration of intravenous contrast. COMPARISON: MRI 07/24/2022 CLINICAL HISTORY: Low back pain, prior surgery, new symptoms FINDINGS: BONES AND ALIGNMENT: Similar grade 1 anterolisthesis of L3 on L4. Marked rotatory levoconvex lumbar scoliosis centered at L2. Normal vertebral body heights. Bone marrow signal is unremarkable. SPINAL CORD: The conus terminates normally. SOFT TISSUES: No paraspinal mass. L1-L2: Mild right foraminal stenosis and mild right subarticular lateral recess stenosis due to spurring, similar. L2-L3: Disc bulge and ligamentum flavum thickening with similar moderate canal stenosis. Facet arthropathy with similar mild right foraminal stenosis. L3-L4: Grade 1 anterolisthesis. Uncovering of the disc. Ligamentum flavum thickening and bilateral facet arthropathy. Similar severe canal stenosis. Similar moderate left foraminal stenosis. L4-L5: Left facet arthropathy. Similar mild left subarticular recess and foraminal stenosis. Patent canal and right foramen. L5-S1: Facet arthropathy. No significant stenosis. IMPRESSION: 1. Similar multilevel degenerative change, detailed above and greatest at L3-L4 where there is severe canal and subarticular recess stenosis and moderate left foraminal stenosis. 2. Marked rotatory levoconvex lumbar scoliosis. Electronically signed by: Gilmore Molt MD MD 03/17/2024 10:19 PM EST RP Workstation: [id number]   CT PELVIS WO  CONTRAST Result Date: 03/17/2024 EXAM: CT PELVIS, WITHOUT IV CONTRAST 03/17/2024 09:29:14 PM TECHNIQUE: Axial images were acquired through the pelvis without IV contrast. Reformatted images were reviewed. Automated exposure control, iterative reconstruction, and/or weight based adjustment of the mA/kV was utilized to reduce the radiation dose to as low as reasonably achievable. COMPARISON: None available. CLINICAL HISTORY: Pelvic fracture; Pelvic pain. FINDINGS: BONES: Degenerative changes within the lower lumbar spine. No acute fracture or focal osseous lesion. JOINTS: Right hip: Severe degenerative changes. Mild flattening of the femoral head and subchondral cyst formation. Left hip: Mild degenerative changes. No dislocation. SOFT TISSUES: Soft tissue nodule within the subcutaneous fat of the anterior abdominal wall, likely a sebaceous cyst. INTRAPELVIC CONTENTS: Uterine fibroids. Limited images of the intrapelvic contents demonstrate no acute abnormality. IMPRESSION: 1. No acute osseous abnormality of the pelvis. 2. Severe degenerative changes of the right hip. Electronically signed by: Pinkie Pebbles MD MD 03/17/2024 09:37 PM EST RP Workstation: [id number]   CT Lumbar Spine Wo Contrast Result Date: 03/17/2024 EXAM: CT OF THE LUMBAR SPINE WITHOUT CONTRAST 03/17/2024 09:29:14 PM TECHNIQUE: CT of the lumbar spine was performed without the administration of intravenous contrast. Multiplanar reformatted images are provided for review. Automated exposure control, iterative reconstruction, and/or weight based adjustment of the mA/kV was utilized to reduce the radiation dose to as low as reasonably achievable. COMPARISON: None available. CLINICAL HISTORY: Ataxia, lumbar trauma. FINDINGS: BONES AND ALIGNMENT: Normal vertebral body heights. No acute fracture or suspicious bone lesion. Moderate upper lumbar dextrocoliosis. DEGENERATIVE CHANGES: Mild multilevel degenerative changes. SOFT TISSUES: Cholelithiasis.  Vascular calcifications. Uterine fibroids. IMPRESSION: 1. No acute findings. Electronically signed by: Pinkie Pebbles MD MD 03/17/2024 09:34 PM EST RP Workstation: [id number]   CT HEAD WO CONTRAST ( ) Result Date: 03/17/2024 EXAM: CT HEAD WITHOUT CONTRAST 03/17/2024 09:29:14 PM TECHNIQUE: CT of the head was performed without the administration of intravenous contrast. Automated exposure control, iterative reconstruction, and/or weight based adjustment of the mA/kV was utilized to reduce the radiation dose to as low as  reasonably achievable. COMPARISON: 12/13/2019 CLINICAL HISTORY: Neuro deficit, acute, stroke suspected. FINDINGS: BRAIN AND VENTRICLES: No acute hemorrhage. No evidence of acute infarct. No hydrocephalus. No extra-axial collection. No mass effect or midline shift. Subcortical and periventricular small vessel ischemic changes. Mild intracranial atherosclerosis. ORBITS: No acute abnormality. SINUSES: No acute abnormality. SOFT TISSUES AND SKULL: No acute soft tissue abnormality. No skull fracture. IMPRESSION: 1. No acute intracranial abnormality. Electronically signed by: Pinkie Pebbles MD MD 03/17/2024 09:33 PM EST RP Workstation: [id number]   Assessment/Plan [name] is a 84 y.o. female with medical history significant for HTN, HLD, autonomic dysfunction, chronic back pain, fibromyalgia, scoliosis, GERD, migraine, osteoporosis, CVA, and osteoarthritis of the right hip who presented to the ED for evaluation of elevated blood pressure, back pain and dizziness and admitted for intractable low back pain.   # Intractable low back pain # Severe spinal stenosis # Severe osteoarthritis of the right hip - Elderly patient with history of chronic low back and hip pain presented with worsening pain with associated headache and elevated BP - CT of the pelvis shows severe degenerative changes of the right hip and MRI of the L-spine shows severe coronal and subarticular recess stenosis and  moderate left foraminal stenosis - Patient unable to discharged from the ED safely due to persistent pain and inability to ambulate - Admitted for pain control and PT evaluation - Multimodal pain control with lidocaine  patch, scheduled Tylenol  and as needed oxycodone , Robaxin  and IV Dilaudid  - Status post IV Solu-Medrol  in the ED, start prednisone  with likely taper at discharge - PT/OT eval and treat - Fall precautions - Follow-up with orthopedic surgery and neurosurgery in the outpatient  # Hypertensive urgency - Patient with uncontrolled BP presented for evaluation of severely elevated BP found to have SBP in the 160s to 190s on admission - This is a chronic problem for patient with likely exacerbation due to her worsening hip and back pain - Continue HCTZ and Lopressor  (had recent refills) - Follow-up for med rec and resume rest of her meds - IV hydralazine  as needed for SBP > 180  # HLD - Continue rosuvastatin   # Hx of migraines - Continue prophylaxis Topamax    DVT prophylaxis: Lovenox      Code Status: Full Code  Consults called: None  Family Communication: Discussed findings/results and plan for admission with friend at bedside  Severity of Illness: The appropriate patient status for this patient is OBSERVATION. Observation status is judged to be reasonable and necessary in order to provide the required intensity of service to ensure the patient's safety. The patient's presenting symptoms, physical exam findings, and initial radiographic and laboratory data in the context of their medical condition is felt to place them at decreased risk for further clinical deterioration. Furthermore, it is anticipated that the patient will be medically stable for discharge from the hospital within 2 midnights of admission.   Level of care: Telemetry    Lou Claretta HERO, MD 03/18/2024, 2:10 AM [facility] Pager: [phone] Isaiah [phone]:10   If 7PM-7AM, please contact  night-coverage [email or web address] Password [email or web address]     [1]  Allergies Allergen Reactions   Metoclopramide  Other (See Comments)    Speech is garbled, extreme trouble making words   Codeine    Pravastatin      Muscle cramps   "

## 2024-03-17 NOTE — ED Triage Notes (Signed)
 Patient BIB [facility] from [facility]. Patient arrived to facility for injection in hip for chronic bilateral hip pain. Patient was laid flat to receive injection. Patient became dizzy and hypertensive at 220/100. EMS reports symptoms resolved when patient was sat back up but patient was unable to receive injection.

## 2024-03-17 NOTE — Discharge Instructions (Addendum)
 You have hypertension.  Please continue taking your meds as prescribed by your doctor.  I have prescribed tramadol  as needed for pain  Please see your orthopedic doctor and your primary care doctor.  Return to ER if you have severe chest pain or dizziness or headache or hip pain

## 2024-03-18 ENCOUNTER — Other Ambulatory Visit: Payer: Self-pay

## 2024-03-18 DIAGNOSIS — M5459 Other low back pain: Secondary | ICD-10-CM | POA: Diagnosis not present

## 2024-03-18 DIAGNOSIS — M25551 Pain in right hip: Secondary | ICD-10-CM | POA: Diagnosis not present

## 2024-03-18 DIAGNOSIS — M25552 Pain in left hip: Secondary | ICD-10-CM | POA: Diagnosis not present

## 2024-03-18 DIAGNOSIS — M48061 Spinal stenosis, lumbar region without neurogenic claudication: Secondary | ICD-10-CM

## 2024-03-18 DIAGNOSIS — I10 Essential (primary) hypertension: Secondary | ICD-10-CM | POA: Diagnosis not present

## 2024-03-18 DIAGNOSIS — I160 Hypertensive urgency: Secondary | ICD-10-CM | POA: Diagnosis not present

## 2024-03-18 DIAGNOSIS — G43909 Migraine, unspecified, not intractable, without status migrainosus: Secondary | ICD-10-CM

## 2024-03-18 DIAGNOSIS — M1611 Unilateral primary osteoarthritis, right hip: Secondary | ICD-10-CM

## 2024-03-18 LAB — BASIC METABOLIC PANEL WITH GFR
Anion gap: 10 (ref 5–15)
BUN: 24 mg/dL — ABNORMAL HIGH (ref 8–23)
CO2: 23 mmol/L (ref 22–32)
Calcium: 8.3 mg/dL — ABNORMAL LOW (ref 8.9–10.3)
Chloride: 104 mmol/L (ref 98–111)
Creatinine, Ser: 0.9 mg/dL (ref 0.44–1.00)
GFR, Estimated: >60 mL/min
Glucose, Bld: 164 mg/dL — ABNORMAL HIGH (ref 70–99)
Potassium: 4 mmol/L (ref 3.5–5.1)
Sodium: 137 mmol/L (ref 135–145)

## 2024-03-18 LAB — CBC
HCT: 37.8 % (ref 36.0–46.0)
Hemoglobin: 12.1 g/dL (ref 12.0–15.0)
MCH: 28.9 pg (ref 26.0–34.0)
MCHC: 32 g/dL (ref 30.0–36.0)
MCV: 90.2 fL (ref 80.0–100.0)
Platelets: 261 K/uL (ref 150–400)
RBC: 4.19 MIL/uL (ref 3.87–5.11)
RDW: 13.3 % (ref 11.5–15.5)
WBC: 9.7 K/uL (ref 4.0–10.5)
nRBC: 0 % (ref 0.0–0.2)

## 2024-03-18 MED ORDER — OXYCODONE HCL 5 MG PO TABS
5.0000 mg | ORAL_TABLET | Freq: Three times a day (TID) | ORAL | Status: DC | PRN
  Administered 2024-03-19 – 2024-03-22 (×3): 5 mg via ORAL
  Filled 2024-03-18 (×4): qty 1

## 2024-03-18 MED ORDER — METOPROLOL TARTRATE 25 MG PO TABS
25.0000 mg | ORAL_TABLET | Freq: Two times a day (BID) | ORAL | Status: DC
  Administered 2024-03-18 – 2024-03-23 (×11): 25 mg via ORAL
  Filled 2024-03-18 (×11): qty 1

## 2024-03-18 MED ORDER — ALBUTEROL SULFATE (2.5 MG/3ML) 0.083% IN NEBU: 2.5000 mg | INHALATION_SOLUTION | Freq: Four times a day (QID) | RESPIRATORY_TRACT | Status: DC | PRN

## 2024-03-18 MED ORDER — SODIUM CHLORIDE 0.9 % IV SOLN: INTRAVENOUS | Status: DC

## 2024-03-18 MED ORDER — ROSUVASTATIN CALCIUM 5 MG PO TABS
5.0000 mg | ORAL_TABLET | ORAL | Status: DC
  Administered 2024-03-18: 5 mg via ORAL
  Filled 2024-03-18: qty 1

## 2024-03-18 MED ORDER — VITAMIN D 25 MCG (1000 UNIT) PO TABS
1000.0000 [IU] | ORAL_TABLET | Freq: Every day | ORAL | Status: DC
  Administered 2024-03-18 – 2024-03-23 (×6): 1000 [IU] via ORAL
  Filled 2024-03-18 (×6): qty 1

## 2024-03-18 MED ORDER — PREDNISONE 20 MG PO TABS: 60.0000 mg | ORAL_TABLET | Freq: Every day | ORAL | Status: DC

## 2024-03-18 MED ORDER — LOSARTAN POTASSIUM 50 MG PO TABS
75.0000 mg | ORAL_TABLET | Freq: Every day | ORAL | Status: DC
  Administered 2024-03-18 – 2024-03-23 (×6): 75 mg via ORAL
  Filled 2024-03-18 (×6): qty 1

## 2024-03-18 MED ORDER — HYDROCHLOROTHIAZIDE 25 MG PO TABS
25.0000 mg | ORAL_TABLET | Freq: Every day | ORAL | Status: DC
  Administered 2024-03-18 – 2024-03-23 (×6): 25 mg via ORAL
  Filled 2024-03-18 (×6): qty 1

## 2024-03-18 MED ORDER — HYDRALAZINE HCL 20 MG/ML IJ SOLN: 10.0000 mg | Freq: Three times a day (TID) | INTRAMUSCULAR | Status: DC | PRN

## 2024-03-18 MED ORDER — TOPIRAMATE 25 MG PO TABS
50.0000 mg | ORAL_TABLET | Freq: Every day | ORAL | Status: DC
  Administered 2024-03-18 – 2024-03-22 (×6): 50 mg via ORAL
  Filled 2024-03-18 (×6): qty 2

## 2024-03-18 MED ORDER — FLUTICASONE PROPIONATE 50 MCG/ACT NA SUSP
1.0000 | Freq: Every day | NASAL | Status: DC | PRN
  Administered 2024-03-19 – 2024-03-20 (×2): 1 via NASAL
  Filled 2024-03-18: qty 16

## 2024-03-18 MED ORDER — HYDRALAZINE HCL 20 MG/ML IJ SOLN
10.0000 mg | Freq: Three times a day (TID) | INTRAMUSCULAR | Status: DC | PRN
  Administered 2024-03-20: 10 mg via INTRAVENOUS
  Filled 2024-03-18: qty 1

## 2024-03-18 MED ORDER — PREDNISONE 20 MG PO TABS
40.0000 mg | ORAL_TABLET | Freq: Every day | ORAL | Status: DC
  Administered 2024-03-18 – 2024-03-23 (×6): 40 mg via ORAL
  Filled 2024-03-18 (×6): qty 2

## 2024-03-18 NOTE — Evaluation (Signed)
 Occupational Therapy Evaluation Patient Details Name: [name] MRN: [id number] DOB: 12/18/1940 Today's Date: 03/18/2024   History of Present Illness   84 y.o. female presents to [facility] 03/17/24 from [facility] with elevated BP and dizziness. Pt also with back pain and R hip pain. Imaging showed degenerative changes of R hip. MRI of L spine showed severe coronal and subarticular recess stenosis and moderate left foraminal stenosis. PMHx: HTN, HLD, autonomic dysfunction, chronic back pain, fibromyalgia, scoliosis, GERD, migraine, osteoporosis, CVA, and osteoarthritis of the right hip     Clinical Impressions Pt resting in bed comfortably, worried about her BP. Remained stable around 170s/60s during session. Pt lives alone, has supportive neighbor who can stop by daily, PLOF mod I with rollator, has been sleeping in recliner as getting in/out of bed has been difficult. Pt reports hurting her knee on thanksgiving limiting her mobility. Pt currently requires mod A to assist to EOB, increased time, min A for STS and ambulate 15 feet with cane and 1 HHA. Pt had difficulty getting back up on the stretcher, too high for her. Pt not able to don/doff socks sitting on stretcher, typically able to do when feet are planted on ground. Recommending postacute rehab <3hrs/day to maximize functional strength and safety with mobility, will continue to see acutely to progress as able.      If plan is discharge home, recommend the following:   A little help with walking and/or transfers;A little help with bathing/dressing/bathroom;Assistance with cooking/housework;Assist for transportation;Help with stairs or ramp for entrance     Functional Status Assessment   Patient has had a recent decline in their functional status and demonstrates the ability to make significant improvements in function in a reasonable and predictable amount of time.     Equipment Recommendations   None recommended by  OT     Recommendations for Other Services         Precautions/Restrictions   Precautions Precautions: Fall Recall of Precautions/Restrictions: Intact Restrictions Weight Bearing Restrictions Per Provider Order: No     Mobility Bed Mobility Overal bed mobility: Needs Assistance Bed Mobility: Supine to Sit, Sit to Supine     Supine to sit: Mod assist, HOB elevated Sit to supine: Mod assist, HOB elevated   General bed mobility comments: assist to bring LE's off EOB and to raise trunk. Assist to bring BLE's onto EOB    Transfers Overall transfer level: Needs assistance Equipment used: Rolling walker (2 wheels) Transfers: Sit to/from Stand, Bed to chair/wheelchair/BSC Sit to Stand: Min assist     Step pivot transfers: Min assist     General transfer comment: icnreased time, used cane with 1 HHA to ambulate      Balance Overall balance assessment: Needs assistance, Mild deficits observed, not formally tested, History of Falls Sitting-balance support: No upper extremity supported, Feet supported Sitting balance-Leahy Scale: Fair     Standing balance support: Single extremity supported, During functional activity Standing balance-Leahy Scale: Fair Standing balance comment: static standing fair, needs cane/RW for dynamic balance                           ADL either performed or assessed with clinical judgement   ADL Overall ADL's : Needs assistance/impaired Eating/Feeding: Independent   Grooming: Set up;Sitting   Upper Body Bathing: Set up;Contact guard assist   Lower Body Bathing: Moderate assistance;Sitting/lateral leans;Sit to/from stand   Upper Body Dressing : Set up;Supervision/safety   Lower Body  Dressing: Moderate assistance;Sitting/lateral leans;Sit to/from stand   Toilet Transfer: Minimal assistance (cane)           Functional mobility during ADLs: Minimal assistance;Cane General ADL Comments: Pt used cane with 1 HHA to ambulate  around room, min A     Vision Baseline Vision/History: 0 No visual deficits Ability to See in Adequate Light: 0 Adequate Patient Visual Report: No change from baseline       Perception         Praxis         Pertinent Vitals/Pain Pain Assessment Pain Assessment: No/denies pain     Extremity/Trunk Assessment Upper Extremity Assessment Upper Extremity Assessment: Overall WFL for tasks assessed   Lower Extremity Assessment Lower Extremity Assessment: Defer to PT evaluation   Cervical / Trunk Assessment Cervical / Trunk Assessment: Normal   Communication Communication Communication: No apparent difficulties   Cognition Arousal: Alert Behavior During Therapy: WFL for tasks assessed/performed Cognition: No apparent impairments             OT - Cognition Comments: A/O, conversational                 Following commands: Intact       Cueing  General Comments   Cueing Techniques: Verbal cues;Gestural cues;Tactile cues;Visual cues  VSS on RA   Exercises     Shoulder Instructions      Home Living Family/patient expects to be discharged to:: Private residence Living Arrangements: Alone Available Help at Discharge: Neighbor;Available PRN/intermittently Type of Home: House Home Access: Ramped entrance     Home Layout: One level     Bathroom Shower/Tub: Chief Strategy Officer: Standard Bathroom Accessibility: Yes How Accessible: Accessible via walker Home Equipment: Shower seat - built in;BSC/3in1;Cane - single point;Wheelchair Financial Trader (4 wheels);Rolling Walker (2 wheels)   Additional Comments: Pt reports she lives alone, has neighbor who is her best friend, can assist every day      Prior Functioning/Environment Prior Level of Function : Independent/Modified Independent             Mobility Comments: Pt reports uses cane, but since thanksgiving she has been using rollator due to fall and R knee injury ADLs  Comments: Has not been driving since thanksgiving, neighbor helps with transportation    OT Problem List: Decreased strength;Decreased range of motion;Decreased activity tolerance;Impaired balance (sitting and/or standing)   OT Treatment/Interventions: Self-care/ADL training;Therapeutic exercise;Energy conservation;DME and/or AE instruction;Therapeutic activities;Patient/family education;Balance training      OT Goals(Current goals can be found in the care plan section)   Acute Rehab OT Goals Patient Stated Goal: to improve strength OT Goal Formulation: With patient Time For Goal Achievement: 04/01/24 Potential to Achieve Goals: Good   OT Frequency:  Min 2X/week    Co-evaluation              AM-PAC OT 6 Clicks Daily Activity     Outcome Measure Help from another person eating meals?: None Help from another person taking care of personal grooming?: A Little Help from another person toileting, which includes using toliet, bedpan, or urinal?: A Little Help from another person bathing (including washing, rinsing, drying)?: A Lot Help from another person to put on and taking off regular upper body clothing?: A Little Help from another person to put on and taking off regular lower body clothing?: A Lot 6 Click Score: 17   End of Session Equipment Utilized During Treatment: Gait belt;Other (comment) (cane) Nurse Communication: Mobility status  Activity Tolerance: Patient tolerated treatment well Patient left: in bed;with call bell/phone within reach  OT Visit Diagnosis: Unsteadiness on feet (R26.81);Other abnormalities of gait and mobility (R26.89);Muscle weakness (generalized) (M62.81)                Time: 8798-8754 OT Time Calculation (min): 44 min Charges:  OT General Charges $OT Visit: 1 Visit OT Evaluation $OT Eval Low Complexity: 1 Low OT Treatments $Self Care/Home Management : 8-22 mins $Therapeutic Activity: 8-22 mins  Carra Brindley, OTR/L   Elouise JONELLE Bott 03/18/2024, 12:52 PM

## 2024-03-18 NOTE — ED Notes (Signed)
 Pt placed on bedpan. Pt states her right hip and knee is hurting again.   Pt readjusted in bed

## 2024-03-18 NOTE — ED Notes (Signed)
 RN placed pt on 2L nasal canula while she rest

## 2024-03-18 NOTE — ED Notes (Signed)
 Pt b/p while laying is 156/51 (81), pt b/p while sitting is 166/58 (87) and pt b/p while standing is 157/87 (105).

## 2024-03-18 NOTE — Progress Notes (Addendum)
 SNF ref faxed. Bed offers pending. Family first choice is [facility]  Update - 1:33pm. Spoke w admis. Director at [facility], bed not avail. Until Tues-Wed

## 2024-03-18 NOTE — Evaluation (Signed)
 Physical Therapy Evaluation Patient Details Name: [name] MRN: [id number] DOB: Apr 02, 1940 Today's Date: 03/18/2024  History of Present Illness  84 y.o. female presents to [facility] 03/17/24 from [facility] Neurosurgery with elevated BP and dizziness. Pt also with back pain and R hip pain. Imaging showed degenerative changes of R hip. MRI of L spine showed severe coronal and subarticular recess stenosis and moderate left foraminal stenosis. PMHx: HTN, HLD, autonomic dysfunction, chronic back pain, fibromyalgia, scoliosis, GERD, migraine, osteoporosis, CVA, and osteoarthritis of the right hip   Clinical Impression  Pt currently lives alone and is typically ModI with a rollator. Pt presents below mobility baseline requiring ModA for bed mobility and MinA to stand with RW. Able to step-pivot to [facility] with assist needed to complete posterior pericare. Pt was then able to ambulate 81ft with CGA and RW with distance limited by BLE weakness and fatigue. Pt has intermittent assist available from her neighbor who normally assists with driving. At this time, recommending <3hrs post acute rehab to maximize rehab potential. If pt was able to have 24/7 assist, would then recommend HHPT. Acute PT to follow.         If plan is discharge home, recommend the following: A lot of help with walking and/or transfers;A lot of help with bathing/dressing/bathroom;Assist for transportation;Help with stairs or ramp for entrance   Can travel by private vehicle   Yes    Equipment Recommendations None recommended by PT     Functional Status Assessment Patient has had a recent decline in their functional status and demonstrates the ability to make significant improvements in function in a reasonable and predictable amount of time.     Precautions / Restrictions Precautions Precautions: Fall Recall of Precautions/Restrictions: Intact Restrictions Weight Bearing Restrictions Per Provider Order: No      Mobility  Bed  Mobility Overal bed mobility: Needs Assistance Bed Mobility: Supine to Sit, Sit to Supine    Supine to sit: Mod assist, HOB elevated Sit to supine: Mod assist, HOB elevated   General bed mobility comments: assist to bring LE's off EOB and to raise trunk. Assist to bring BLE's onto EOB    Transfers Overall transfer level: Needs assistance Equipment used: Rolling walker (2 wheels) Transfers: Sit to/from Stand, Bed to chair/wheelchair/BSC Sit to Stand: Min assist   Step pivot transfers: Min assist     General transfer comment: increased time and effort to raise and lower with MinA for steadying assist.    Ambulation/Gait Ambulation/Gait assistance: Contact guard assist Gait Distance (Feet): 15 Feet Assistive device: Rolling walker (2 wheels) Gait Pattern/deviations: Step-through pattern, Decreased stride length, Trunk flexed, Shuffle Gait velocity: decr    General Gait Details: forward flexed posture with limited foot clearance. Distance limited by fatigue and BLE weakness    Balance Overall balance assessment: Needs assistance, Mild deficits observed, not formally tested, History of Falls Sitting-balance support: No upper extremity supported, Feet supported Sitting balance-Leahy Scale: Fair    Standing balance support: No upper extremity supported Standing balance-Leahy Scale: Fair Standing balance comment: able to stand statically with no UE support, RW dynamically       Pertinent Vitals/Pain Pain Assessment Pain Assessment: No/denies pain    Home Living Family/patient expects to be discharged to:: Private residence Living Arrangements: Alone Available Help at Discharge: Neighbor;Available PRN/intermittently Type of Home: House Home Access: Ramped entrance     Home Layout: One level Home Equipment: Shower seat - built in;BSC/3in1;Cane - single point;Wheelchair Financial Trader (4 wheels);Rolling Walker (2  wheels) Additional Comments: Pt reports she lives alone,  has neighbor who is her best friend, can assist every day    Prior Function Prior Level of Function : Independent/Modified Independent    Mobility Comments: Pt reports uses cane, but since thanksgiving she has been using rollator due to fall and R knee injury ADLs Comments: Has not been driving since thanksgiving, neighbor helps with transportation     Extremity/Trunk Assessment   Upper Extremity Assessment Upper Extremity Assessment: Defer to OT evaluation    Lower Extremity Assessment Lower Extremity Assessment: Generalized weakness (R>L, limited by pain)    Cervical / Trunk Assessment Cervical / Trunk Assessment: Normal  Communication   Communication Communication: No apparent difficulties    Cognition Arousal: Alert Behavior During Therapy: WFL for tasks assessed/performed   PT - Cognitive impairments: No apparent impairments    Following commands: Intact       Cueing Cueing Techniques: Verbal cues, Tactile cues     General Comments General comments (skin integrity, edema, etc.): VSS on RA     PT Assessment Patient needs continued PT services  PT Problem List Decreased strength;Decreased activity tolerance;Decreased balance;Decreased mobility       PT Treatment Interventions DME instruction;Gait training;Functional mobility training;Therapeutic activities;Therapeutic exercise;Balance training;Neuromuscular re-education;Patient/family education    PT Goals (Current goals can be found in the Care Plan section)  Acute Rehab PT Goals Patient Stated Goal: to go home PT Goal Formulation: With patient Time For Goal Achievement: 04/01/24 Potential to Achieve Goals: Good    Frequency Min 2X/week        AM-PAC PT 6 Clicks Mobility  Outcome Measure Help needed turning from your back to your side while in a flat bed without using bedrails?: A Little Help needed moving from lying on your back to sitting on the side of a flat bed without using bedrails?: A  Lot Help needed moving to and from a bed to a chair (including a wheelchair)?: A Little Help needed standing up from a chair using your arms (e.g., wheelchair or bedside chair)?: A Little Help needed to walk in hospital room?: A Little Help needed climbing 3-5 steps with a railing? : A Lot 6 Click Score: 16    End of Session   Activity Tolerance: Patient tolerated treatment well Patient left: in bed;with call bell/phone within reach Nurse Communication: Mobility status PT Visit Diagnosis: Unsteadiness on feet (R26.81);Other abnormalities of gait and mobility (R26.89);Muscle weakness (generalized) (M62.81);History of falling (Z91.81)    Time: 8895-8855 PT Time Calculation (min) (ACUTE ONLY): 40 min   Charges:   PT Evaluation $PT Eval Low Complexity: 1 Low PT Treatments $Therapeutic Activity: 23-37 mins PT General Charges $$ ACUTE PT VISIT: 1 Visit        Kate ORN, PT, DPT Secure Chat Preferred  Rehab Office [phone]   Kate BRAVO Wendolyn 03/18/2024, 12:11 PM

## 2024-03-18 NOTE — Progress Notes (Signed)
 Patient arrived to 6N22 A&O X4 with a 3/10 pain located on her back, pt states she has had that pain for a long time. Pt voided in bed pan. All needs met at this time, bed in lowest position and call light within reach.

## 2024-03-18 NOTE — NC FL2 (Signed)
 " [place]  MEDICAID FL2 LEVEL OF CARE FORM     IDENTIFICATION  Patient Name: [name] Birthdate: [birth year] Sex: female Admission Date (Current Location): 03/17/2024  [facility] and [place] Number:  Producer, Television/film/video and Address:  [facility] [place], [address] [place], [place] [address]      Provider Number: [id number]  Attending Physician Name and Address:  Judeth Trenda BIRCH, MD  Relative Name and Phone Number:  Cissell,Brian [phone]    Current Level of Care: Hospital Recommended Level of Care: Skilled Nursing Facility Prior Approval Number:    Date Approved/Denied:   PASRR Number: [id number]  Discharge Plan: SNF    Current Diagnoses: Patient Active Problem List   Diagnosis Date Noted   Spinal stenosis of lumbar region 03/18/2024   Hypertensive urgency 03/18/2024   Migraine without status migrainosus, not intractable 03/18/2024   Bilateral hip pain 03/18/2024   Primary osteoarthritis of right hip 03/18/2024   Intractable low back pain 03/17/2024   Chronic migraine with aura 08/13/2021   Pain in both lower extremities 01/25/2018   Newly diagnosed diabetes (HCC) 12/25/2017   Esophageal dysphagia    Family history of colon cancer    Benign neoplasm of descending colon    Chest pain 02/13/2016   Leg pain 01/28/2013   Autonomic orthostatic hypotension 11/25/2011   Nausea 09/25/2011   Tachycardia, paroxysmal (HCC) 09/24/2011   DVT (deep venous thrombosis) (HCC) 09/24/2011   Hyponatremia 09/24/2011   Essential hypertension 05/13/2011   Dyspnea on exertion 04/23/2011   Palpitations 04/23/2011   Hyperlipidemia 04/23/2011   Hx of colonic polyps 02/06/2011   Family history of malignant neoplasm of gastrointestinal tract 02/06/2011   Status post partial resection of colon 02/06/2011   Obesity 02/06/2011   EXOGENOUS OBESITY 07/01/2007   GERD 07/01/2007   Acute intractable headache 07/01/2007   Hx of adenomatous polyp of colon  06/02/2007   GASTRITIS 05/31/2007   HIATAL HERNIA 05/31/2007   DIVERTICULOSIS, COLON 05/31/2007   Esophageal ring, acquired 02/18/2002    Orientation RESPIRATION BLADDER Height & Weight     Self, Time, Situation, Place  O2 (2l) Continent Weight: 154 lb (69.9 kg) Height:  4' 11 (149.9 cm)  BEHAVIORAL SYMPTOMS/MOOD NEUROLOGICAL BOWEL NUTRITION STATUS      Continent Diet (see dc summary)  AMBULATORY STATUS COMMUNICATION OF NEEDS Skin   Limited Assist Verbally Normal                       Personal Care Assistance Level of Assistance  Bathing, Feeding, Dressing Bathing Assistance: Limited assistance Feeding assistance: Limited assistance Dressing Assistance: Limited assistance     Functional Limitations Info  Sight, Speech, Hearing Sight Info: Adequate Hearing Info: Adequate Speech Info: Adequate    SPECIAL CARE FACTORS FREQUENCY  PT (By licensed PT), OT (By licensed OT)     PT Frequency: 5x/week OT Frequency: 5x/week            Contractures Contractures Info: Not present    Additional Factors Info  Code Status, Allergies Code Status Info: full code Allergies Info: Metoclopramide   Codeine  Nsaids  Pravastatin            Current Medications (03/18/2024):  This is the current hospital active medication list Current Facility-Administered Medications  Medication Dose Route Frequency Provider Last Rate Last Admin   acetaminophen  (TYLENOL ) tablet 1,000 mg  1,000 mg Oral TID Amponsah, Prosper M, MD   1,000 mg at 03/18/24 [phone]  bisacodyl  (DULCOLAX) EC tablet 5 mg  5 mg Oral Daily PRN Amponsah, Prosper M, MD       enoxaparin  (LOVENOX ) injection 40 mg  40 mg Subcutaneous Daily Amponsah, Prosper M, MD   40 mg at 03/18/24 0911   hydrALAZINE  (APRESOLINE ) injection 10 mg  10 mg Intravenous Q8H PRN Lou Claretta HERO, MD       hydrochlorothiazide  (HYDRODIURIL ) tablet 25 mg  25 mg Oral Daily Amponsah, Prosper M, MD   25 mg at 03/18/24 9088   HYDROmorphone  (DILAUDID )  injection 0.5 mg  0.5 mg Intravenous Q6H PRN Amponsah, Prosper M, MD   0.5 mg at 03/18/24 0241   lidocaine  (LIDODERM ) 5 % 2 patch  2 patch Transdermal QHS Amponsah, Prosper M, MD   2 patch at 03/18/24 0013   methocarbamol  (ROBAXIN ) tablet 500 mg  500 mg Oral Q8H PRN Amponsah, Prosper M, MD       metoprolol  tartrate (LOPRESSOR ) tablet 25 mg  25 mg Oral BID Amponsah, Prosper M, MD   25 mg at 03/18/24 9088   ondansetron  (ZOFRAN ) tablet 4 mg  4 mg Oral Q6H PRN Lou Claretta HERO, MD       Or   ondansetron  (ZOFRAN ) injection 4 mg  4 mg Intravenous Q6H PRN Amponsah, Prosper M, MD       oxyCODONE  (Oxy IR/ROXICODONE ) immediate release tablet 5 mg  5 mg Oral Q6H PRN Lou Claretta HERO, MD       predniSONE  (DELTASONE ) tablet 40 mg  40 mg Oral Q breakfast Hongalgi, Anand D, MD   40 mg at 03/18/24 9186   rosuvastatin  (CRESTOR ) tablet 5 mg  5 mg Oral Q M,W,F Amponsah, Prosper M, MD   5 mg at 03/18/24 0911   senna-docusate (Senokot-S) tablet 1 tablet  1 tablet Oral QHS PRN Lou Claretta HERO, MD       topiramate  (TOPAMAX ) tablet 50 mg  50 mg Oral QHS Amponsah, Prosper M, MD   50 mg at 03/18/24 0229   Current Outpatient Medications  Medication Sig Dispense Refill   albuterol  (PROVENTIL  HFA;VENTOLIN  HFA) 108 (90 Base) MCG/ACT inhaler Inhale 2 puffs into the lungs every 6 (six) hours as needed for wheezing or shortness of breath.     cholecalciferol  (VITAMIN D3) 25 MCG (1000 UNIT) tablet Take 1,000 Units by mouth daily.     cyanocobalamin  (,VITAMIN B-12,) 1000 MCG/ML injection Inject 1,000 mcg into the muscle every 30 (thirty) days.     fluticasone  (FLONASE ) 50 MCG/ACT nasal spray Place 1 spray into both nostrils daily as needed for allergies.  1   hydrochlorothiazide  (HYDRODIURIL ) 25 MG tablet Take 1 tablet (25 mg total) by mouth daily. 90 tablet 3   losartan  (COZAAR ) 25 MG tablet Take 75 mg by mouth daily.     metoprolol  tartrate (LOPRESSOR ) 25 MG tablet Take 1 tablet (25 mg total) by mouth 2 (two) times  daily. 180 tablet 3   predniSONE  (DELTASONE ) 10 MG tablet Take 10 mg by mouth 2 (two) times daily with a meal.     promethazine  (PHENERGAN ) 25 MG tablet Take 1 tablet (25 mg total) by mouth every 6 (six) hours as needed for nausea or vomiting. 30 tablet 1   topiramate  (TOPAMAX ) 25 MG tablet Take 2 tablets at night 180 tablet 3   traMADol  (ULTRAM ) 50 MG tablet Take 1 tablet (50 mg total) by mouth every 6 (six) hours as needed. 15 tablet 0   Ubrogepant  (UBRELVY ) 50 MG TABS Take 1 tablet (50 mg  total) by mouth 2 (two) times daily as needed. (Patient taking differently: Take 50 mg by mouth 2 (two) times daily as needed (Migraine / Headache).) 30 tablet 1   cephALEXin  (KEFLEX ) 500 MG capsule Take 1 capsule by mouth 3 (three) times daily. (Patient not taking: Reported on 03/18/2024)     CONTOUR NEXT TEST test strip daily.     Dextran 70-Hypromellose ([employer] NATURAL TEARS OP) Apply 1-2 drops to eye daily as needed (dry eyes).     lisinopril  (ZESTRIL ) 10 MG tablet TAKE 1 TABLET(10 MG) BY MOUTH DAILY (Patient not taking: Reported on 03/18/2024) 90 tablet 3   loratadine (CLARITIN) 10 MG tablet Take 10 mg by mouth daily as needed for allergies. (Patient not taking: No sig reported)     pantoprazole  (PROTONIX ) 40 MG tablet TAKE 1 TABLET(40 MG) BY MOUTH TWICE DAILY BEFORE A MEAL (Patient not taking: No sig reported) 180 tablet 0   rosuvastatin  (CRESTOR ) 5 MG tablet TAKE 1 TABLET BY MOUTH 3 TIMES A WEEK (Patient not taking: Reported on 03/18/2024) 36 tablet 3   SILVADENE  1 % cream Apply 1 Application topically 2 (two) times daily. (Patient not taking: Reported on 03/18/2024)       Discharge Medications: Please see discharge summary for a list of discharge medications.  Relevant Imaging Results:  Relevant Lab Results:   Additional Information SSN [id number]  [facility] Kaysen Sefcik     "

## 2024-03-18 NOTE — Plan of Care (Signed)
  Problem: Health Behavior/Discharge Planning: Goal: Ability to manage health-related needs will improve Outcome: Progressing   Problem: Activity: Goal: Risk for activity intolerance will decrease Outcome: Progressing   Problem: Nutrition: Goal: Adequate nutrition will be maintained Outcome: Progressing   

## 2024-03-18 NOTE — ED Notes (Signed)
 CCMD notified

## 2024-03-18 NOTE — Progress Notes (Signed)
 Pt has had two incontinence episodes since arriving on the floor. Pt states she cannot tell us  before she goes, she wears pull ups at home. Pt informed that we do not carry incontinence briefs on this floor, pt stated she will call family and ask them to bring some. MD made aware and stated ok to apply external catheter.
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
## 2024-03-18 NOTE — ED Notes (Signed)
 RN attempted to collect lab without success. Phlebotomy notified

## 2024-03-19 DIAGNOSIS — M25551 Pain in right hip: Secondary | ICD-10-CM | POA: Diagnosis not present

## 2024-03-19 DIAGNOSIS — I10 Essential (primary) hypertension: Secondary | ICD-10-CM

## 2024-03-19 DIAGNOSIS — M25552 Pain in left hip: Secondary | ICD-10-CM | POA: Diagnosis not present

## 2024-03-19 DIAGNOSIS — M5459 Other low back pain: Secondary | ICD-10-CM | POA: Diagnosis not present

## 2024-03-19 NOTE — Plan of Care (Signed)
  Problem: Education: Goal: Knowledge of General Education information will improve Description: Including pain rating scale, medication(s)/side effects and non-pharmacologic comfort measures Outcome: Progressing   Problem: Clinical Measurements: Goal: Ability to maintain clinical measurements within normal limits will improve Outcome: Progressing   Problem: Nutrition: Goal: Adequate nutrition will be maintained Outcome: Progressing   Problem: Elimination: Goal: Will not experience complications related to urinary retention Outcome: Progressing
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
## 2024-03-19 NOTE — Care Management Obs Status (Signed)
 MEDICARE OBSERVATION STATUS NOTIFICATION   Patient Details  Name: [name] MRN: [id number] Date of Birth: 1940/06/08   Medicare Observation Status Notification Given:  Yes    Robynn Eileen Hoose, RN 03/19/2024, 8:58 AM

## 2024-03-19 NOTE — Progress Notes (Signed)
 Mobility Specialist: Progress Note   03/19/24 1300  Mobility  Activity Ambulated with assistance  Level of Assistance Contact guard assist, steadying assist  Assistive Device Front wheel walker  Distance Ambulated (ft) 30 ft  Activity Response Tolerated well  Mobility Referral Yes  Mobility visit 1 Mobility  Mobility Specialist Start Time (ACUTE ONLY) 1210  Mobility Specialist Stop Time (ACUTE ONLY) 1237  Mobility Specialist Time Calculation (min) (ACUTE ONLY) 27 min    Pt received in bed, agreeable to mobility session. ModA for bed mobility to assist LE and scooting EOB. Felt a little woosy once EOB, BP 179/67. After symptom subsided, pt stood with light minA. Ambulated to the door and back (20') + an additional 10' w/ CGA before returning to the chair. Left in chair with all needs met, call bell in reach.   Ileana Lute Mobility Specialist Please contact via SecureChat or Rehab office at [phone]

## 2024-03-20 ENCOUNTER — Observation Stay (HOSPITAL_COMMUNITY)

## 2024-03-20 DIAGNOSIS — M5459 Other low back pain: Secondary | ICD-10-CM | POA: Diagnosis not present

## 2024-03-20 DIAGNOSIS — R42 Dizziness and giddiness: Secondary | ICD-10-CM | POA: Diagnosis not present

## 2024-03-20 DIAGNOSIS — M25551 Pain in right hip: Secondary | ICD-10-CM | POA: Diagnosis not present

## 2024-03-20 DIAGNOSIS — I10 Essential (primary) hypertension: Secondary | ICD-10-CM | POA: Diagnosis not present

## 2024-03-20 DIAGNOSIS — M25552 Pain in left hip: Secondary | ICD-10-CM | POA: Diagnosis not present

## 2024-03-20 LAB — GLUCOSE, CAPILLARY: Glucose-Capillary: 105 mg/dL — ABNORMAL HIGH (ref 70–99)

## 2024-03-20 MED ORDER — HYDRALAZINE HCL 20 MG/ML IJ SOLN
10.0000 mg | Freq: Once | INTRAMUSCULAR | Status: AC
  Administered 2024-03-20: 10 mg via INTRAVENOUS
  Filled 2024-03-20: qty 1

## 2024-03-20 NOTE — Significant Event (Signed)
 Rapid Response Event Note   Reason for Call :  Decreased sensation in BLE  Initial Focused Assessment:  Pt lying in bed with eyes open. She is AO, anxious. Her complains consist of nausea, feeling cold, back, hip, and leg pain. Her BUE have some tremors when asked to hold them up. Her NIH is 0. Skin is hot to touch, dry.   T-98.3(R), HR-70, BP-188/88, RR-18, SpO2-99% on RA.   Interventions:  CBG-105 Hydralazine  10mg  IV Plan of Care:  Pt NIH-0. Her BP is high. Give hydralazine  and recheck VS.  She has dilaudid  and zofran  prn for pain/nausea. Please call RRT I further assistance needed.  Event Summary:   MD Notified: Chavez, TRH NP notified by bedside RN  Call [phone] Arrival [phone] End Upfz:[id number]  Tish Graeme Piety, RN
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
## 2024-03-20 NOTE — Plan of Care (Signed)
  Problem: Clinical Measurements: Goal: Respiratory complications will improve Outcome: Progressing   Problem: Activity: Goal: Risk for activity intolerance will decrease Outcome: Progressing   Problem: Nutrition: Goal: Adequate nutrition will be maintained Outcome: Progressing   Problem: Pain Managment: Goal: General experience of comfort will improve and/or be controlled Outcome: Progressing

## 2024-03-20 NOTE — Plan of Care (Signed)
   Problem: Education: Goal: Knowledge of General Education information will improve Description Including pain rating scale, medication(s)/side effects and non-pharmacologic comfort measures Outcome: Progressing   Problem: Health Behavior/Discharge Planning: Goal: Ability to manage health-related needs will improve Outcome: Progressing

## 2024-03-20 NOTE — Progress Notes (Signed)
" °   03/20/24 0100  Assess: MEWS Score  Temp 98.1 F (36.7 C)  BP (!) 201/100  Pulse Rate 71  Resp 18  SpO2 99 %  O2 Device Room Air  Assess: MEWS Score  MEWS Temp 0  MEWS Systolic 2  MEWS Pulse 0  MEWS RR 0  MEWS LOC 0  MEWS Score 2  MEWS Score Color Yellow  Assess: if the MEWS score is Yellow or Red  Were vital signs accurate and taken at a resting state? Yes  Does the patient meet 2 or more of the SIRS criteria? No  MEWS guidelines implemented  Yes, yellow  Treat  MEWS Interventions Considered administering scheduled or prn medications/treatments as ordered  Take Vital Signs  Increase Vital Sign Frequency  Yellow: Q2hr x1, continue Q4hrs until patient remains green for 12hrs  Escalate  MEWS: Escalate Yellow: Discuss with charge nurse and consider notifying provider and/or RRT  Notify: Charge Nurse/RN  Name of Charge Nurse/RN Notified Games Developer, RN  Assess: SIRS CRITERIA  SIRS Temperature  0  SIRS Respirations  0  SIRS Pulse 0  SIRS WBC 0  SIRS Score Sum  0   Pt yellow MEWS for BP. hx htn, has been fluctuating since admission, prn hydralazine  given at this time. Asymptomatic.  "

## 2024-03-20 NOTE — Progress Notes (Signed)
 Carotid duplex  has been completed. Refer to [facility] under chart review to view preliminary results.   03/20/2024  4:37 PM [name], Ricka BIRCH

## 2024-03-20 NOTE — Progress Notes (Signed)
" °   03/20/24 0417  Vitals  Temp (!) 97.1 F (36.2 C)  Temp Source Oral  BP (!) 85/35  MAP (mmHg) (!) 50  BP Location Right Arm  BP Method Automatic  Patient Position (if appropriate) Lying  Pulse Rate (!) 58  Pulse Rate Source Monitor  Resp 20  MEWS COLOR  MEWS Score Color Green  Oxygen  Therapy  SpO2 96 %  O2 Device Room Air  MEWS Score  MEWS Temp 0  MEWS Systolic 1  MEWS Pulse 0  MEWS RR 0  MEWS LOC 0  MEWS Score 1   Pt is alert and oriented, reports headache, remains nauseous, BP recheck manually by this nurse, 90/62. Lavanda Horns, NP notified. "

## 2024-03-20 NOTE — TOC Initial Note (Signed)
 Transition of Care [facility]) - Initial/Assessment Note    Patient Details  Name: [name] MRN: [id number] Date of Birth: 08/08/1940  [facility] CM/SW Contact:    Dino CHRISTELLA Au, LCSWA Phone Number: 03/20/2024, 2:56 PM  Clinical Narrative:                  SW met with pt at bedside to discuss bed offers. Pt reports she may accept offer from [facility], but wants to speak with her son Redell first. Requested SW/CM check in tomorrow. Pt reports prior to hospitalization, is from home alone, has access to r/w, BSC over toilet and shower chair. Pt will likely have friend transport at d/c.   SW spoke with [place] ([employer] [phone]) to start auth. Mzq#[id number]  Clinicals faxed to: [phone]  Update 323pm SW spoke with pt again at request from nurse. Pt requested referral to [facility] [address] SW informed pt, that it is ALF not SNF. Pt reports she will keep looking to see if any other facilities come to mind. SW did inform pt that referrals have been sent to all [place] SNFs that accept outside referrals, any other options will be outside of [place].   Expected Discharge Plan: Skilled Nursing Facility Barriers to Discharge: Insurance Authorization, Continued Medical Work up   Patient Goals and CMS Choice Patient states their goals for this hospitalization and ongoing recovery are:: return home CMS Medicare.gov Compare Post Acute Care list provided to:: Patient Choice offered to / list presented to : Patient [place] ownership interest in [facility].provided to:: Patient    Expected Discharge Plan and Services     Post Acute Care Choice: Skilled Nursing Facility Living arrangements for the past 2 months: Single Family Home                                      Prior Living Arrangements/Services Living arrangements for the past 2 months: Single Family Home Lives with:: Self Patient language and need for interpreter reviewed::  Yes Do you feel safe going back to the place where you live?: Yes          Current home services: DME Criminal Activity/Legal Involvement Pertinent to Current Situation/Hospitalization: No - Comment as needed  Activities of Daily Living   ADL Screening (condition at time of admission) Independently performs ADLs?: No Does the patient have a NEW difficulty with bathing/dressing/toileting/self-feeding that is expected to last >3 days?: Yes (Initiates electronic notice to provider for possible OT consult) Does the patient have a NEW difficulty with getting in/out of bed, walking, or climbing stairs that is expected to last >3 days?: Yes (Initiates electronic notice to provider for possible PT consult) Does the patient have a NEW difficulty with communication that is expected to last >3 days?: No Is the patient deaf or have difficulty hearing?: No Does the patient have difficulty seeing, even when wearing glasses/contacts?: No Does the patient have difficulty concentrating, remembering, or making decisions?: No  Permission Sought/Granted Permission sought to share information with : Facility Industrial/product Designer granted to share information with : Yes, Verbal Permission Granted  Share Information with NAME: Redell  Permission granted to share info w AGENCY: SNF  Permission granted to share info w Relationship: son  Permission granted to share info w Contact Information: [phone]  Emotional Assessment Appearance:: Appears stated age Attitude/Demeanor/Rapport: Engaged Affect (typically observed): Accepting  Orientation: : Oriented to Self, Oriented to Place, Oriented to  Time, Oriented to Situation   Psych Involvement: No (comment)  Admission diagnosis:  Bilateral hip pain [M25.551, M25.552] Intractable low back pain [M54.59] Hypertension, unspecified type [I10] Spinal stenosis of lumbar region, unspecified whether neurogenic claudication present [M48.061] Patient  Active Problem List   Diagnosis Date Noted   Spinal stenosis of lumbar region 03/18/2024   Hypertensive urgency 03/18/2024   Migraine without status migrainosus, not intractable 03/18/2024   Bilateral hip pain 03/18/2024   Primary osteoarthritis of right hip 03/18/2024   Intractable low back pain 03/17/2024   Chronic migraine with aura 08/13/2021   Pain in both lower extremities 01/25/2018   Newly diagnosed diabetes (HCC) 12/25/2017   Esophageal dysphagia    Family history of colon cancer    Benign neoplasm of descending colon    Chest pain 02/13/2016   Leg pain 01/28/2013   Autonomic orthostatic hypotension 11/25/2011   Nausea 09/25/2011   Tachycardia, paroxysmal (HCC) 09/24/2011   DVT (deep venous thrombosis) (HCC) 09/24/2011   Hyponatremia 09/24/2011   Essential hypertension 05/13/2011   Dyspnea on exertion 04/23/2011   Palpitations 04/23/2011   Hyperlipidemia 04/23/2011   Hx of colonic polyps 02/06/2011   Family history of malignant neoplasm of gastrointestinal tract 02/06/2011   Status post partial resection of colon 02/06/2011   Obesity 02/06/2011   EXOGENOUS OBESITY 07/01/2007   GERD 07/01/2007   Acute intractable headache 07/01/2007   Hx of adenomatous polyp of colon 06/02/2007   GASTRITIS 05/31/2007   HIATAL HERNIA 05/31/2007   DIVERTICULOSIS, COLON 05/31/2007   Esophageal ring, acquired 02/18/2002   PCP:  Cleotilde Planas, MD Pharmacy:   [facility] [phone] - [place], [place] - [address] [place] [address] Phone: [phone] Fax: [phone]  [id number] - [place], [place] - [address] [place] [address] Phone: [phone] Fax: [phone]  [facility] [address] [place] [address] Phone: [phone] Fax: [phone]     Social Drivers of Health (SDOH) Social History: SDOH Screenings   Food Insecurity: No  Food Insecurity (03/18/2024)  Housing: Unknown (03/18/2024)  Transportation Needs: No Transportation Needs (03/18/2024)  Utilities: Not At Risk (03/18/2024)  Social Connections: Moderately Integrated (03/18/2024)  Tobacco Use: Low Risk (03/17/2024)   SDOH Interventions:     Readmission Risk Interventions     No data to display

## 2024-03-21 ENCOUNTER — Observation Stay (HOSPITAL_COMMUNITY)

## 2024-03-21 DIAGNOSIS — R42 Dizziness and giddiness: Secondary | ICD-10-CM

## 2024-03-21 DIAGNOSIS — M25551 Pain in right hip: Secondary | ICD-10-CM | POA: Diagnosis not present

## 2024-03-21 DIAGNOSIS — M5459 Other low back pain: Secondary | ICD-10-CM | POA: Diagnosis not present

## 2024-03-21 DIAGNOSIS — I10 Essential (primary) hypertension: Secondary | ICD-10-CM | POA: Diagnosis not present

## 2024-03-21 LAB — BASIC METABOLIC PANEL WITH GFR
Anion gap: 8 (ref 5–15)
BUN: 32 mg/dL — ABNORMAL HIGH (ref 8–23)
CO2: 27 mmol/L (ref 22–32)
Calcium: 8.9 mg/dL (ref 8.9–10.3)
Chloride: 100 mmol/L (ref 98–111)
Creatinine, Ser: 1.06 mg/dL — ABNORMAL HIGH (ref 0.44–1.00)
GFR, Estimated: 52 mL/min — ABNORMAL LOW
Glucose, Bld: 99 mg/dL (ref 70–99)
Potassium: 3.9 mmol/L (ref 3.5–5.1)
Sodium: 135 mmol/L (ref 135–145)

## 2024-03-21 MED ORDER — BUTALBITAL-APAP-CAFFEINE 50-325-40 MG PO TABS
1.0000 | ORAL_TABLET | Freq: Four times a day (QID) | ORAL | Status: DC | PRN
  Administered 2024-03-21: 1 via ORAL
  Filled 2024-03-21: qty 1

## 2024-03-21 MED ORDER — IOHEXOL 350 MG/ML SOLN
75.0000 mL | Freq: Once | INTRAVENOUS | Status: AC | PRN
  Administered 2024-03-21: 75 mL via INTRAVENOUS

## 2024-03-21 MED ORDER — UBROGEPANT 50 MG PO TABS: 50.0000 mg | ORAL_TABLET | Freq: Two times a day (BID) | ORAL | Status: DC | PRN

## 2024-03-21 NOTE — Progress Notes (Addendum)
 Physical Therapy Treatment Patient Details Name: [name] MRN: [id number] DOB: 1940-07-27 Today's Date: 03/21/2024   History of Present Illness 84 y.o. female [facility] from [facility] with elevated BP and dizziness. Pt also with back pain and R hip pain. Imaging showed degenerative changes of R hip. MRI of L spine showed severe coronal and subarticular recess stenosis and moderate left foraminal stenosis. PMHx: HTN, HLD, autonomic dysfunction, chronic back pain, fibromyalgia, scoliosis, GERD, migraine, osteoporosis, CVA, and osteoarthritis of the right hip    PT Comments  Pt making steady progress towards her physical therapy goals despite reporting feeling weaker today. Requiring min-modA for functional mobility. Ambulating 60 ft with a RW with slowed step to pattern. Patient will benefit from continued inpatient follow up therapy, <3 hours/day to address strengthening, balance, functional mobility, endurance.    If plan is discharge home, recommend the following: A lot of help with walking and/or transfers;A lot of help with bathing/dressing/bathroom;Assist for transportation;Help with stairs or ramp for entrance   Can travel by private vehicle        Equipment Recommendations  None recommended by PT    Recommendations for Other Services       Precautions / Restrictions Precautions Precautions: Fall Recall of Precautions/Restrictions: Intact Restrictions Weight Bearing Restrictions Per Provider Order: No     Mobility  Bed Mobility Overal bed mobility: Needs Assistance Bed Mobility: Supine to Sit     Supine to sit: Mod assist     General bed mobility comments: assist to bring LE's off EOB and to raise trunk.    Transfers Overall transfer level: Needs assistance Equipment used: Rolling walker (2 wheels) Transfers: Sit to/from Stand Sit to Stand: Min assist           General transfer comment: increased time and effort to raise     Ambulation/Gait Ambulation/Gait assistance: Contact guard assist Gait Distance (Feet): 60 Feet Assistive device: Rolling walker (2 wheels) Gait Pattern/deviations: Decreased stride length, Trunk flexed, Shuffle, Step-to pattern Gait velocity: decr Gait velocity interpretation: <1.31 ft/sec, indicative of household ambulator   General Gait Details: forward flexed posture with limited foot clearance.   Stairs             Wheelchair Mobility     Tilt Bed    Modified Rankin (Stroke Patients Only)       Balance Overall balance assessment: Needs assistance, Mild deficits observed, not formally tested, History of Falls Sitting-balance support: Feet supported Sitting balance-Leahy Scale: Fair     Standing balance support: Bilateral upper extremity supported Standing balance-Leahy Scale: Poor                              Communication Communication Communication: No apparent difficulties  Cognition Arousal: Alert Behavior During Therapy: WFL for tasks assessed/performed   PT - Cognitive impairments: No apparent impairments                         Following commands: Intact      Cueing Cueing Techniques: Verbal cues, Tactile cues  Exercises General Exercises - Lower Extremity Long Arc Quad: AROM, Both, 5 reps, Seated    General Comments        Pertinent Vitals/Pain Pain Assessment Pain Assessment: Faces Faces Pain Scale: Hurts little more Pain Location: R hip (chronic) Pain Descriptors / Indicators: Discomfort, Guarding, Grimacing Pain Intervention(s): Limited activity within patient's tolerance, Monitored during  session    Home Living                          Prior Function            PT Goals (current goals can now be found in the care plan section) Acute Rehab PT Goals Patient Stated Goal: to go home PT Goal Formulation: With patient Time For Goal Achievement: 04/01/24 Potential to Achieve Goals:  Good Progress towards PT goals: Progressing toward goals    Frequency    Min 2X/week      PT Plan      Co-evaluation              AM-PAC PT 6 Clicks Mobility   Outcome Measure  Help needed turning from your back to your side while in a flat bed without using bedrails?: A Little Help needed moving from lying on your back to sitting on the side of a flat bed without using bedrails?: A Lot Help needed moving to and from a bed to a chair (including a wheelchair)?: A Little Help needed standing up from a chair using your arms (e.g., wheelchair or bedside chair)?: A Little Help needed to walk in hospital room?: A Little Help needed climbing 3-5 steps with a railing? : A Lot 6 Click Score: 16    End of Session   Activity Tolerance: Patient tolerated treatment well Patient left: with call bell/phone within reach;in chair Nurse Communication: Mobility status PT Visit Diagnosis: Unsteadiness on feet (R26.81);Other abnormalities of gait and mobility (R26.89);Muscle weakness (generalized) (M62.81);History of falling (Z91.81)     Time: 9174-9147 PT Time Calculation (min) (ACUTE ONLY): 27 min  Charges:    $Therapeutic Activity: 23-37 mins PT General Charges $$ ACUTE PT VISIT: 1 Visit                     Aleck Daring, PT, DPT Acute Rehabilitation Services Office [phone]    Aleck ONEIDA Daring 03/21/2024, 10:20 AM

## 2024-03-21 NOTE — Plan of Care (Signed)

## 2024-03-21 NOTE — Plan of Care (Signed)
  Problem: Health Behavior/Discharge Planning: Goal: Ability to manage health-related needs will improve Outcome: Progressing   Problem: Coping: Goal: Level of anxiety will decrease Outcome: Progressing   Problem: Pain Managment: Goal: General experience of comfort will improve and/or be controlled Outcome: Progressing
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
## 2024-03-22 DIAGNOSIS — R42 Dizziness and giddiness: Secondary | ICD-10-CM | POA: Diagnosis not present

## 2024-03-22 DIAGNOSIS — M5459 Other low back pain: Secondary | ICD-10-CM | POA: Diagnosis not present

## 2024-03-22 DIAGNOSIS — I10 Essential (primary) hypertension: Secondary | ICD-10-CM | POA: Diagnosis not present

## 2024-03-22 DIAGNOSIS — M25551 Pain in right hip: Secondary | ICD-10-CM | POA: Diagnosis not present

## 2024-03-22 LAB — BASIC METABOLIC PANEL WITH GFR
Anion gap: 10 (ref 5–15)
BUN: 33 mg/dL — ABNORMAL HIGH (ref 8–23)
CO2: 25 mmol/L (ref 22–32)
Calcium: 8.8 mg/dL — ABNORMAL LOW (ref 8.9–10.3)
Chloride: 101 mmol/L (ref 98–111)
Creatinine, Ser: 0.98 mg/dL (ref 0.44–1.00)
GFR, Estimated: 57 mL/min — ABNORMAL LOW
Glucose, Bld: 94 mg/dL (ref 70–99)
Potassium: 3.7 mmol/L (ref 3.5–5.1)
Sodium: 136 mmol/L (ref 135–145)

## 2024-03-22 MED ORDER — BISACODYL 10 MG RE SUPP
10.0000 mg | Freq: Once | RECTAL | Status: AC
  Administered 2024-03-22: 10 mg via RECTAL
  Filled 2024-03-22: qty 1

## 2024-03-22 MED ORDER — MILK AND MOLASSES ENEMA
1.0000 | Freq: Once | RECTAL | Status: AC
  Administered 2024-03-22: 150 mL via RECTAL
  Filled 2024-03-22: qty 150

## 2024-03-22 NOTE — Plan of Care (Signed)
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
## 2024-03-22 NOTE — TOC Progression Note (Signed)
 Transition of Care [facility]) - Progression Note    Patient Details  Name: [name] MRN: [id number] Date of Birth: 1940/08/27  Transition of Care [facility] CM/SW Contact  Abegail Kloeppel LITTIE Moose, [place] Phone Number: 03/22/2024, 10:43 AM  Clinical Narrative:    CSW spoke with pt about SNF facility choice, pt stated she would like to go with [facility]. CSW contacted [employer] and updated facility choice for insurance auth, auth currently pending for [facility]. CSW will continue to follow.   Expected Discharge Plan: Skilled Nursing Facility Barriers to Discharge: English As A Second Language Teacher, Continued Medical Work up               Expected Discharge Plan and Services     Post Acute Care Choice: Skilled Nursing Facility Living arrangements for the past 2 months: Single Family Home                                       Social Drivers of Health (SDOH) Interventions SDOH Screenings   Food Insecurity: No Food Insecurity (03/18/2024)  Housing: Unknown (03/18/2024)  Transportation Needs: No Transportation Needs (03/18/2024)  Utilities: Not At Risk (03/18/2024)  Social Connections: Moderately Integrated (03/18/2024)  Tobacco Use: Low Risk (03/17/2024)    Readmission Risk Interventions     No data to display

## 2024-03-22 NOTE — TOC Progression Note (Addendum)
 Transition of Care [facility]) - Progression Note    Patient Details  Name: [name] MRN: [id number] Date of Birth: [birth year]  Transition of Care [facility]) CM/SW Contact  Damarious Holtsclaw LITTIE Moose, [place] Phone Number: 03/22/2024, 1:20 PM  Clinical Narrative:    CSW received insurance auth approval for [name] ID [id number], effective 1/12-1/14. CSW contacted [facility] to confirm bed availability, facility will have a bed available for pt tomorrow. CSW will continue to follow.  Expected Discharge Plan: Skilled Nursing Facility Barriers to Discharge: English As A Second Language Teacher, Continued Medical Work up               Expected Discharge Plan and Services     Post Acute Care Choice: Skilled Nursing Facility Living arrangements for the past 2 months: Single Family Home                                       Social Drivers of Health (SDOH) Interventions SDOH Screenings   Food Insecurity: No Food Insecurity (03/18/2024)  Housing: Unknown (03/18/2024)  Transportation Needs: No Transportation Needs (03/18/2024)  Utilities: Not At Risk (03/18/2024)  Social Connections: Moderately Integrated (03/18/2024)  Tobacco Use: Low Risk (03/17/2024)    Readmission Risk Interventions     No data to display

## 2024-03-23 ENCOUNTER — Other Ambulatory Visit (HOSPITAL_COMMUNITY): Payer: Self-pay

## 2024-03-23 MED ORDER — ACETAMINOPHEN 500 MG PO TABS
1000.0000 mg | ORAL_TABLET | Freq: Three times a day (TID) | ORAL | 0 refills | Status: AC
  Filled 2024-03-23: qty 30, 5d supply, fill #0

## 2024-03-23 MED ORDER — LIDOCAINE 5 % EX PTCH
2.0000 | MEDICATED_PATCH | Freq: Every day | CUTANEOUS | 0 refills | Status: AC
  Filled 2024-03-23: qty 30, 15d supply, fill #0

## 2024-03-23 MED ORDER — PREDNISONE 10 MG PO TABS: ORAL_TABLET | ORAL | Status: AC

## 2024-03-23 MED ORDER — SILVER SULFADIAZINE 1 % EX CREA
TOPICAL_CREAM | Freq: Two times a day (BID) | CUTANEOUS | Status: DC
  Filled 2024-03-23: qty 85

## 2024-03-23 MED ORDER — TRAMADOL HCL 50 MG PO TABS: 50.0000 mg | ORAL_TABLET | Freq: Four times a day (QID) | ORAL | 0 refills | Status: AC | PRN

## 2024-03-23 NOTE — Discharge Summary (Signed)
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 patella. No knee effusion identified. SOFT TISSUES: Muscular atrophy in the distal thigh region. Atheromatous vascular calcifications. IMPRESSION: 1. No acute osseous abnormality or knee effusion. 2. Severe tricompartmental osteoarthritis, greatest in the medial compartment, with degenerative subcortical cysts and subcortical sclerosis, and chronic fragmentation of lateral patellar spurring. 3. Several chronic and incidental findings including bony demineralization, distal thigh muscular atrophy, and atherosclerotic vascular calcifications. Electronically signed by: Ryan Salvage MD 02/24/2024 10:31 AM EST RP Workstation: [id number]    Microbiology: Results for orders placed or performed during the hospital encounter of 08/24/20  SARS CORONAVIRUS 2 (TAT 6-24 HRS) Nasopharyngeal Nasopharyngeal Swab     Status: None   Collection Time: 08/24/20  8:18 PM   Specimen: Nasopharyngeal  Swab  Result Value Ref Range Status   SARS Coronavirus 2 NEGATIVE NEGATIVE Final    Comment: (NOTE) SARS-CoV-2 target nucleic acids are NOT DETECTED.  The SARS-CoV-2 RNA is generally detectable in upper and lower respiratory specimens during the acute phase of infection. Negative results do not preclude SARS-CoV-2 infection, do not rule out co-infections with other pathogens, and should not be used as the sole basis for treatment  or other patient management decisions. Negative results must be combined with clinical observations, patient history, and epidemiological information. The expected result is Negative.  Fact Sheet for Patients: hairslick.no  Fact Sheet for Healthcare Providers: quierodirigir.com  This test is not yet approved or cleared by the [place]  FDA and  has been authorized for detection and/or diagnosis of SARS-CoV-2 by FDA under an Emergency Use Authorization (EUA). This EUA will remain  in effect (meaning this test can be used) for the duration of the COVID-19 declaration under Se ction 564(b)(1) of the Act, 21 U.S.C. section 360bbb-3(b)(1), unless the authorization is terminated or revoked sooner.  Performed at [facility], [address], [place], [place] [address]     Labs: CBC: Recent Labs  Lab 03/17/24 1715 03/18/24 0321  WBC 12.1* 9.7  NEUTROABS 8.2*  --   HGB 13.7 12.1  HCT 42.0 37.8  MCV 87.9 90.2  PLT 321 261   Basic Metabolic Panel: Recent Labs  Lab 03/17/24 1715 03/18/24 0321 03/21/24 0555 03/22/24 0550  NA 138 137 135 136  K 3.7 4.0 3.9 3.7  CL 102 104 100 101  CO2 25 23 27 25   GLUCOSE 90 164* 99 94  BUN 27* 24* 32* 33*  CREATININE 0.99 0.90 1.06* 0.98  CALCIUM  9.2 8.3* 8.9 8.8*   Liver Function Tests: Recent Labs  Lab 03/17/24 1715  AST 22  ALT 10  ALKPHOS 93  BILITOT 0.6  PROT 7.0  ALBUMIN 4.2   CBG: Recent Labs  Lab 03/20/24 0226  GLUCAP 105*     Discharge time spent: 35 minutes.  Signed: Elgin Lam, MD [facility] 03/23/2024 "

## 2024-03-23 NOTE — Progress Notes (Signed)
 Physical Therapy Treatment Patient Details Name: [name] MRN: [id number] DOB: 05/19/1940 Today's Date: 03/23/2024   History of Present Illness 84 y.o. female presents to [facility] 03/17/24 from [facility] with elevated BP and dizziness. Pt also with back pain and R hip pain. Imaging showed degenerative changes of R hip. MRI of L spine showed severe coronal and subarticular recess stenosis and moderate left foraminal stenosis. PMHx: HTN, HLD, autonomic dysfunction, chronic back pain, fibromyalgia, scoliosis, GERD, migraine, osteoporosis, CVA, and osteoarthritis of the right hip    PT Comments  Pt received in bathroom after being assisted up by NT, pt alert and agreeable to therapy session, with good participation in transfer and gait training. Pt needing up to minA for transfers and CGA with youth height RW for gait progression. Pt agreeable to sit up in chair for lunch at end of session. Patient will benefit from continued inpatient follow up therapy, <3 hours/day.    If plan is discharge home, recommend the following: A lot of help with walking and/or transfers;A lot of help with bathing/dressing/bathroom;Assist for transportation;Help with stairs or ramp for entrance   Can travel by private vehicle     Yes  Equipment Recommendations  None recommended by PT (youth height RW likely, but TBD at [facility])    Recommendations for Other Services       Precautions / Restrictions Precautions Precautions: Fall Recall of Precautions/Restrictions: Intact Restrictions Weight Bearing Restrictions Per Provider Order: No     Mobility  Bed Mobility Overal bed mobility: Needs Assistance             General bed mobility comments: pt received up on toilet    Transfers Overall transfer level: Needs assistance Equipment used: Rolling walker (2 wheels) (youth height RW brought to her room) Transfers: Sit to/from Stand Sit to Stand: Min assist, Contact guard assist            General transfer comment: CGA from toilet with wall rail, minA from chair without arm rests    Ambulation/Gait Ambulation/Gait assistance: Contact guard assist Gait Distance (Feet): 50 Feet (12ft to chair, then 4ft) Assistive device: Rolling walker (2 wheels) (youth height RW) Gait Pattern/deviations: Decreased stride length, Trunk flexed, Shuffle, Step-to pattern, Antalgic, Decreased weight shift to right Gait velocity: decr     General Gait Details: forward flexed posture but slightly improved trunk extension after youth height RW brought to room for longer gait trial. SpO2/HR WFL On RA   Stairs             Wheelchair Mobility     Tilt Bed    Modified Rankin (Stroke Patients Only)       Balance Overall balance assessment: Needs assistance, Mild deficits observed, not formally tested, History of Falls Sitting-balance support: Feet supported Sitting balance-Leahy Scale: Fair     Standing balance support: Bilateral upper extremity supported Standing balance-Leahy Scale: Poor Standing balance comment: resting forearms on sink while washing her hands; RW reliant                            Communication Communication Communication: No apparent difficulties  Cognition Arousal: Alert Behavior During Therapy: WFL for tasks assessed/performed   PT - Cognitive impairments: No apparent impairments                       PT - Cognition Comments: cooperative, motivated Following commands: Intact      Cueing  Cueing Techniques: Verbal cues, Tactile cues, Gestural cues  Exercises      General Comments General comments (skin integrity, edema, etc.): SpO2/HR WFL on RA      Pertinent Vitals/Pain Pain Assessment Pain Assessment: Faces Faces Pain Scale: Hurts little more Pain Location: R hip (chronic) Pain Descriptors / Indicators: Discomfort, Guarding Pain Intervention(s): Monitored during session, Repositioned    Home Living                           Prior Function            PT Goals (current goals can now be found in the care plan section) Acute Rehab PT Goals Patient Stated Goal: to go home Progress towards PT goals: Progressing toward goals    Frequency    Min 2X/week      PT Plan      Co-evaluation              AM-PAC PT 6 Clicks Mobility   Outcome Measure  Help needed turning from your back to your side while in a flat bed without using bedrails?: A Little Help needed moving from lying on your back to sitting on the side of a flat bed without using bedrails?: A Lot Help needed moving to and from a bed to a chair (including a wheelchair)?: A Little Help needed standing up from a chair using your arms (e.g., wheelchair or bedside chair)?: A Little Help needed to walk in hospital room?: A Little Help needed climbing 3-5 steps with a railing? : Total 6 Click Score: 15    End of Session Equipment Utilized During Treatment: Gait belt Activity Tolerance: Patient tolerated treatment well Patient left: with call bell/phone within reach;in chair;Other (comment) (set up to eat lunch) Nurse Communication: Mobility status PT Visit Diagnosis: Unsteadiness on feet (R26.81);Other abnormalities of gait and mobility (R26.89);Muscle weakness (generalized) (M62.81);History of falling (Z91.81)     Time: 8868-8842 PT Time Calculation (min) (ACUTE ONLY): 26 min  Charges:    $Gait Training: 8-22 mins $Therapeutic Activity: 8-22 mins PT General Charges $$ ACUTE PT VISIT: 1 Visit                     Yorel Redder P., PTA Acute Rehabilitation Services Secure Chat Preferred 9a-5:30pm Office: [phone]    Connell HERO [facility] 03/23/2024, 2:58 PM

## 2024-03-23 NOTE — TOC Progression Note (Signed)
 Transition of Care [facility]) - Progression Note    Patient Details  Name: [name] MRN: [id number] Date of Birth: October 05, 1940  [facility] CM/SW Contact  Chesney Suares LITTIE [place], [place] Phone Number: 03/23/2024, 12:30 PM  Clinical Narrative:    CSW confirmed with Chari at [facility] that pt can admit today, Myrene stated they are just waiting on a pt to discharge and she will let CSW know when bed will be ready so CSW can call [facility]. CSW will continue to follow.   Expected Discharge Plan: Skilled Nursing Facility Barriers to Discharge: English As A Second Language Teacher, Continued Medical Work up               Expected Discharge Plan and Services     Post Acute Care Choice: Skilled Nursing Facility Living arrangements for the past 2 months: Single Family Home Expected Discharge Date: 03/23/24                                     Social Drivers of Health (SDOH) Interventions SDOH Screenings   Food Insecurity: No Food Insecurity (03/18/2024)  Housing: Unknown (03/18/2024)  Transportation Needs: No Transportation Needs (03/18/2024)  Utilities: Not At Risk (03/18/2024)  Social Connections: Moderately Integrated (03/18/2024)  Tobacco Use: Low Risk (03/17/2024)    Readmission Risk Interventions     No data to display

## 2024-03-23 NOTE — Plan of Care (Signed)
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
## 2024-03-23 NOTE — TOC Transition Note (Signed)
 Transition of Care [facility]) - Discharge Note   Patient Details  Name: [name] MRN: [id number] Date of Birth: 02-25-1941  Transition of Care [facility]) CM/SW Contact:  Jeoffrey LITTIE Maranda IS[place] Phone Number: 03/23/2024, 1:15 PM   Clinical Narrative:    Patient will DC to: [place] Anticipated DC date: 03/23/24 Family notified:Yes Transport by: [facility]   Per MD patient ready for DC to [facility] RN to call report prior to discharge [phone]. RN, patient, patient's family, and facility notified of DC. Discharge Summary and FL2 sent to facility. DC packet on chart. Ambulance transport requested for patient.   CSW will sign off for now as social work intervention is no longer needed. Please consult us  again if new needs arise.     Final next level of care: Skilled Nursing Facility Barriers to Discharge: Barriers Resolved   Patient Goals and CMS Choice Patient states their goals for this hospitalization and ongoing recovery are:: SNF CMS Medicare.gov Compare Post Acute Care list provided to:: Patient Choice offered to / list presented to : Patient [place] ownership interest in [facility].provided to:: Patient    Discharge Placement   Existing PASRR number confirmed : 03/23/24          Patient [facility] bed at: [facility] Patient to be transferred to facility by: [facility] Name of family member notified: [place] Patient and family notified of of transfer: 03/23/24  Discharge Plan and Services Additional resources added to the After Visit Summary for       Post Acute Care Choice: Skilled Nursing Facility                               Social Drivers of Health (SDOH) Interventions SDOH Screenings   Food Insecurity: No Food Insecurity (03/18/2024)  Housing: Unknown (03/18/2024)  Transportation Needs: No Transportation Needs (03/18/2024)  Utilities: Not At Risk (03/18/2024)  Social Connections: Moderately Integrated (03/18/2024)  Tobacco Use: Low Risk  (03/17/2024)     Readmission Risk Interventions     No data to display

## 2024-03-23 NOTE — Hospital Course (Signed)
 [name] is a 84 y.o. female with a history of chronic low back pain, GERD< hypertension, hyperlipidemia, stroke, osteoarthritis.  Patient presented secondary to elevated blood pressure, back pain and dizziness. Patient started on treatment for hypertensive urgency. Workup for dizziness was unremarkable for an acute process and presumed secondary to underlying hypertension. Patient was evaluated by PT and OT with recommendation for SNF on discharge.

## 2024-03-24 NOTE — Assessment & Plan Note (Signed)
 BP elevated today but hasn't taken meds and struggling with pain. Continue metoprolol  and losartan  at current doses.

## 2024-03-24 NOTE — Assessment & Plan Note (Signed)
"  Well-controlled on metoprolol   "

## 2024-03-31 ENCOUNTER — Telehealth: Payer: Self-pay | Admitting: Neurology

## 2024-03-31 NOTE — Telephone Encounter (Signed)
 Pt returned call. Please call back when available.

## 2024-03-31 NOTE — Telephone Encounter (Signed)
 Re:  Ubrelvy  50MG  tablets has been APPROVED from 08/19/2023 to 08/18/2024. Ran test claim, Copay is $45.00 per 30DS/16 Tablets.  Pt is being told by [facility] she has to pay over $300.00 Pt is asking for assistance with this.

## 2024-03-31 NOTE — Telephone Encounter (Signed)
 I returned call to pt and stated that I would check pa status and mail pt assistance forms.  Forms mailed. Pt wanted to know if she needs to establish with new MD since last MD ws Dr. Jenel. I stated that I would ask NP [facility]. Pt voiced understanding of all discussed.   RXPATEAM: please check ubrelvy  pa status  SARAH: pt is unable to get samples as she is in rehab. Should she establish with MD?

## 2024-03-31 NOTE — Telephone Encounter (Signed)
 Called and lvm 1st attempt by hf 03/31/24

## 2024-03-31 NOTE — Telephone Encounter (Signed)
 Called and spoke to pt and she is agreeable to staying on with just [facility] NP

## 2024-04-01 ENCOUNTER — Other Ambulatory Visit (HOSPITAL_COMMUNITY): Payer: Self-pay

## 2024-04-01 NOTE — Telephone Encounter (Signed)
 PA is not needed. Copay may be higher due to start of new year and deductible has to be met before copay may be lowered.

## 2024-04-05 NOTE — Telephone Encounter (Signed)
 Pt called returning call , Informed CMA will call back

## 2024-04-05 NOTE — Telephone Encounter (Signed)
 Lvm 1st attempt by hf 04/05/24

## 2024-04-15 ENCOUNTER — Ambulatory Visit: Admitting: Cardiovascular Disease

## 2024-08-18 ENCOUNTER — Ambulatory Visit: Admitting: Neurology
